# Patient Record
Sex: Female | Born: 1965 | Race: Black or African American | Hispanic: No | Marital: Married | Smoking: Current every day smoker
Health system: Southern US, Community
[De-identification: ages and names within clinical notes are randomized; demographics above are authoritative.]

## PROBLEM LIST (undated history)

## (undated) ENCOUNTER — Emergency Department (HOSPITAL_COMMUNITY): Admission: EM | Payer: No Typology Code available for payment source | Source: Home / Self Care

## (undated) DIAGNOSIS — R0683 Snoring: Secondary | ICD-10-CM

## (undated) DIAGNOSIS — I480 Paroxysmal atrial fibrillation: Secondary | ICD-10-CM

## (undated) DIAGNOSIS — E0590 Thyrotoxicosis, unspecified without thyrotoxic crisis or storm: Secondary | ICD-10-CM

## (undated) DIAGNOSIS — E119 Type 2 diabetes mellitus without complications: Secondary | ICD-10-CM

## (undated) DIAGNOSIS — I10 Essential (primary) hypertension: Secondary | ICD-10-CM

## (undated) DIAGNOSIS — D219 Benign neoplasm of connective and other soft tissue, unspecified: Secondary | ICD-10-CM

## (undated) HISTORY — DX: Essential (primary) hypertension: I10

## (undated) HISTORY — DX: Thyrotoxicosis, unspecified without thyrotoxic crisis or storm: E05.90

## (undated) HISTORY — DX: Type 2 diabetes mellitus without complications: E11.9

## (undated) HISTORY — PX: UTERINE FIBROID SURGERY: SHX826

## (undated) HISTORY — DX: Snoring: R06.83

## (undated) HISTORY — DX: Morbid (severe) obesity due to excess calories: E66.01

## (undated) HISTORY — DX: Paroxysmal atrial fibrillation: I48.0

---

## 1998-06-11 ENCOUNTER — Other Ambulatory Visit: Admission: RE | Admit: 1998-06-11 | Discharge: 1998-06-11 | Payer: Self-pay | Admitting: Obstetrics and Gynecology

## 1998-10-27 ENCOUNTER — Emergency Department (HOSPITAL_COMMUNITY): Admission: EM | Admit: 1998-10-27 | Discharge: 1998-10-27 | Payer: Self-pay | Admitting: Emergency Medicine

## 2000-01-08 ENCOUNTER — Emergency Department (HOSPITAL_COMMUNITY): Admission: EM | Admit: 2000-01-08 | Discharge: 2000-01-08 | Payer: Self-pay | Admitting: Emergency Medicine

## 2000-11-19 ENCOUNTER — Encounter: Payer: Self-pay | Admitting: Emergency Medicine

## 2000-11-19 ENCOUNTER — Emergency Department (HOSPITAL_COMMUNITY): Admission: EM | Admit: 2000-11-19 | Discharge: 2000-11-19 | Payer: Self-pay | Admitting: Emergency Medicine

## 2001-04-30 ENCOUNTER — Other Ambulatory Visit: Admission: RE | Admit: 2001-04-30 | Discharge: 2001-04-30 | Payer: Self-pay | Admitting: Obstetrics and Gynecology

## 2002-02-04 ENCOUNTER — Encounter: Payer: Self-pay | Admitting: Obstetrics and Gynecology

## 2002-02-04 ENCOUNTER — Ambulatory Visit (HOSPITAL_COMMUNITY): Admission: RE | Admit: 2002-02-04 | Discharge: 2002-02-04 | Payer: Self-pay | Admitting: Obstetrics and Gynecology

## 2003-02-07 ENCOUNTER — Encounter: Payer: Self-pay | Admitting: Emergency Medicine

## 2003-02-07 ENCOUNTER — Emergency Department (HOSPITAL_COMMUNITY): Admission: EM | Admit: 2003-02-07 | Discharge: 2003-02-07 | Payer: Self-pay | Admitting: Emergency Medicine

## 2004-11-16 ENCOUNTER — Emergency Department (HOSPITAL_COMMUNITY): Admission: EM | Admit: 2004-11-16 | Discharge: 2004-11-17 | Payer: Self-pay | Admitting: Emergency Medicine

## 2004-12-17 ENCOUNTER — Ambulatory Visit (HOSPITAL_COMMUNITY): Admission: RE | Admit: 2004-12-17 | Discharge: 2004-12-17 | Payer: Self-pay | Admitting: Oncology

## 2005-05-09 ENCOUNTER — Other Ambulatory Visit: Admission: RE | Admit: 2005-05-09 | Discharge: 2005-05-09 | Payer: Self-pay | Admitting: Gynecology

## 2005-10-20 ENCOUNTER — Emergency Department (HOSPITAL_COMMUNITY): Admission: EM | Admit: 2005-10-20 | Discharge: 2005-10-21 | Payer: Self-pay | Admitting: Emergency Medicine

## 2008-03-26 ENCOUNTER — Ambulatory Visit (HOSPITAL_COMMUNITY): Admission: RE | Admit: 2008-03-26 | Discharge: 2008-03-26 | Payer: Self-pay | Admitting: Obstetrics and Gynecology

## 2009-03-15 ENCOUNTER — Observation Stay (HOSPITAL_COMMUNITY): Admission: EM | Admit: 2009-03-15 | Discharge: 2009-03-15 | Payer: Self-pay | Admitting: Emergency Medicine

## 2009-05-04 ENCOUNTER — Emergency Department (HOSPITAL_COMMUNITY): Admission: EM | Admit: 2009-05-04 | Discharge: 2009-05-04 | Payer: Self-pay | Admitting: Emergency Medicine

## 2009-08-20 ENCOUNTER — Other Ambulatory Visit: Admission: RE | Admit: 2009-08-20 | Discharge: 2009-08-20 | Payer: Self-pay | Admitting: Family Medicine

## 2011-03-15 LAB — COMPREHENSIVE METABOLIC PANEL
AST: 38 U/L — ABNORMAL HIGH (ref 0–37)
Albumin: 4.1 g/dL (ref 3.5–5.2)
Alkaline Phosphatase: 45 U/L (ref 39–117)
CO2: 27 mEq/L (ref 19–32)
Chloride: 109 mEq/L (ref 96–112)
GFR calc Af Amer: >60 mL/min (ref >60)
Potassium: 4.5 mEq/L (ref 3.5–5.1)
Total Bilirubin: 0.7 mg/dL (ref 0.3–1.2)

## 2011-03-15 LAB — CBC
HCT: 35.1 % — ABNORMAL LOW (ref 36.0–46.0)
Platelets: 279 10*3/uL (ref 150–400)
RBC: 4.31 MIL/uL (ref 3.87–5.11)
WBC: 9.9 10*3/uL (ref 4.0–10.5)

## 2011-03-15 LAB — POCT PREGNANCY, URINE: Preg Test, Ur: NEGATIVE

## 2011-03-15 LAB — URINALYSIS, ROUTINE W REFLEX MICROSCOPIC
Bilirubin Urine: NEGATIVE
Ketones, ur: NEGATIVE mg/dL
Protein, ur: NEGATIVE mg/dL
Urobilinogen, UA: 0.2 mg/dL (ref 0.0–1.0)

## 2011-03-15 LAB — DIFFERENTIAL
Basophils Absolute: 0 10*3/uL (ref 0.0–0.1)
Basophils Relative: 0 % (ref 0–1)
Eosinophils Absolute: 0.3 10*3/uL (ref 0.0–0.7)
Eosinophils Relative: 3 % (ref 0–5)
Monocytes Absolute: 0.9 10*3/uL (ref 0.1–1.0)

## 2011-03-15 LAB — URINE MICROSCOPIC-ADD ON

## 2011-03-16 LAB — POCT CARDIAC MARKERS
CKMB, poc: <1 ng/mL — ABNORMAL LOW (ref 1.0–8.0)
Myoglobin, poc: 31.9 ng/mL (ref 12–200)
Troponin i, poc: <0.05 ng/mL (ref 0.00–0.09)

## 2011-03-16 LAB — POCT I-STAT, CHEM 8
Chloride: 105 mEq/L (ref 96–112)
Glucose, Bld: 125 mg/dL — ABNORMAL HIGH (ref 70–99)
HCT: 43 % (ref 36.0–46.0)
Potassium: 3.5 mEq/L (ref 3.5–5.1)

## 2011-03-16 LAB — CBC
Hemoglobin: 11.9 g/dL — ABNORMAL LOW (ref 12.0–15.0)
RBC: 4.47 MIL/uL (ref 3.87–5.11)
RDW: 15.7 % — ABNORMAL HIGH (ref 11.5–15.5)

## 2011-03-16 LAB — MAGNESIUM: Magnesium: 1.9 mg/dL (ref 1.5–2.5)

## 2011-03-16 LAB — APTT: aPTT: 26 seconds (ref 24–37)

## 2011-03-16 LAB — DIFFERENTIAL
Basophils Absolute: 0.1 10*3/uL (ref 0.0–0.1)
Lymphocytes Relative: 57 % — ABNORMAL HIGH (ref 12–46)
Monocytes Absolute: 0.9 10*3/uL (ref 0.1–1.0)
Neutro Abs: 3.7 10*3/uL (ref 1.7–7.7)

## 2011-03-16 LAB — LIPID PANEL: VLDL: 17 mg/dL (ref 0–40)

## 2011-03-16 LAB — PROTIME-INR: INR: 1 (ref 0.00–1.49)

## 2011-03-16 LAB — ETHANOL: Alcohol, Ethyl (B): <5 mg/dL (ref 0–10)

## 2011-03-16 LAB — CARDIAC PANEL(CRET KIN+CKTOT+MB+TROPI)
Relative Index: INVALID (ref 0.0–2.5)
Total CK: 87 U/L (ref 7–177)
Troponin I: 0.01 ng/mL (ref 0.00–0.06)

## 2011-03-16 LAB — BASIC METABOLIC PANEL
BUN: 12 mg/dL (ref 6–23)
CO2: 24 mEq/L (ref 19–32)
Chloride: 105 mEq/L (ref 96–112)
Glucose, Bld: 119 mg/dL — ABNORMAL HIGH (ref 70–99)
Potassium: 3.3 mEq/L — ABNORMAL LOW (ref 3.5–5.1)

## 2011-03-16 LAB — D-DIMER, QUANTITATIVE: D-Dimer, Quant: 0.37 ug/mL-FEU (ref 0.00–0.48)

## 2011-03-16 LAB — TSH: TSH: 0.007 u[IU]/mL — ABNORMAL LOW (ref 0.350–4.500)

## 2011-04-19 NOTE — Discharge Summary (Signed)
Haley [name], Haley [name]               ACCOUNT NO.:  [id number]   MEDICAL RECORD NO.:  [id number]          PATIENT TYPE:  OBV   LOCATION:  1419                         FACILITY:  [facility]   PHYSICIAN:  Nanetta Batty, M.D.   DATE OF BIRTH:  [birth year]   DATE OF ADMISSION:  03/14/2009  DATE OF DISCHARGE:  03/15/2009                               DISCHARGE SUMMARY   DISCHARGE DIAGNOSES:  1. Paroxysmal atrial fibrillation, converted spontaneously.  2. Hypothyroidism, TSH pending.   HOSPITAL COURSE:  Patient is a 45 year old female who was admitted to  [facility] March 15, 2009, with PAF which converted spontaneously to  sinus rhythm.  This was without treatment.  She admits to having fairly  heavy caffeine intake on the day of admission, drinking 2 High Voltage  Mountain Dews.  Dr. Allyson Sabal saw her in the morning of March 15, 2009.  Her  potassium was somewhat low and this was replaced.  Her D-dimer was  negative and troponins were negative and TSH is pending.  Dr. Allyson Sabal  suspects it is all secondary to caffeine.  He feels she can be  discharged.  She will have a followup echo in the office and then see  him in a couple weeks.   DISCHARGE MEDICATIONS:  1. Coated aspirin daily.  2. Tapazole 10 mg a day.   Dictation ended at this point.      Abelino Derrick, P.A.      Nanetta Batty, M.D.  Electronically Signed    LKK/MEDQ  D:  03/15/2009  T:  03/15/2009  Job:  [id number]   cc:   Nanetta Batty, M.D.  Fax: [phone]

## 2011-04-19 NOTE — Discharge Summary (Signed)
Haley [name], Haley [name]               ACCOUNT NO.:  [id number]   MEDICAL RECORD NO.:  [id number]          PATIENT TYPE:  OBV   LOCATION:  1419                         FACILITY:  [facility]   PHYSICIAN:  Nanetta Batty, M.D.   DATE OF BIRTH:  [birth year]   DATE OF ADMISSION:  03/14/2009  DATE OF DISCHARGE:  03/15/2009                               DISCHARGE SUMMARY   DISCHARGE DIAGNOSES:  1. Paroxysmal atrial fibrillation, converted spontaneously to sinus      rhythm without treatment.  2. Recent diagnosis of hyperthyroidism, being treated by Dr. Sharl Ma.   HOSPITAL COURSE:  The patient is a 45 year old female who apparently  recently has been diagnosed with hyperthyroidism and is on Tapazole.  She drank two large high voltage [employer].  She presented March 15, 2009, early in the morning with tachycardia.  Apparently in the  emergency room she converted spontaneously without treatment to sinus  rhythm.  She was admitted for further evaluation.  She did have some  chest pain with her tachycardia.  Her enzymes were negative for an MI.  Dr. Allyson Sabal saw her in the morning March 15, 2009.  He felt she could be  discharged.  We will get a followup echocardiogram as an outpatient and  then he will see her back.  We did add a full dose aspirin to her  Tapazole.  Her TSH is pending.  I discussed her case with Dr. Allyson Sabal in  the morning of discharge.  If her TSH is extremely low then we may  consider further treatment, i.e. beta-blocker and/or possibly Coumadin.   LABS:  White count 11.3, hemoglobin 11.9, hematocrit 37.8, platelets  311.  INR is 1.0.  Sodium 140, potassium 3.3.  She was given potassium  replacement.  BUN 12, creatinine 0.5.  Troponins were negative x3.  Lipid panel shows a cholesterol 123, HDL 40, LDL 66.  Alcohol level was  less than 5.  Chest x-ray shows no acute process.  EKG shows AF with  rapid ventricular response on admission and when she converted to sinus  rhythm it was sinus  with nonspecific ST changes.   DISPOSITION:  The patient is discharged in stable condition.  Will  follow up her TSH.  If she has recurrent atrial fibrillation we may  consider adding a beta-blocker and/or Coumadin.  She will get an  echocardiogram in the office and see Dr. Allyson Sabal in followup.      Abelino Derrick, P.A.      Nanetta Batty, M.D.  Electronically Signed    LKK/MEDQ  D:  03/15/2009  T:  03/15/2009  Job:  [id number]   cc:   Nanetta Batty, M.D.  Fax: [phone]   Tonita Cong, M.D.

## 2013-11-04 ENCOUNTER — Ambulatory Visit (HOSPITAL_COMMUNITY): Admit: 2013-11-04 | Payer: No Typology Code available for payment source

## 2013-11-04 ENCOUNTER — Ambulatory Visit (HOSPITAL_COMMUNITY)
Admission: RE | Admit: 2013-11-04 | Discharge: 2013-11-04 | Disposition: A | Discharge status: Home / Self Care | Payer: No Typology Code available for payment source | Source: Ambulatory Visit | Attending: Nurse Practitioner | Admitting: Nurse Practitioner

## 2013-11-04 ENCOUNTER — Other Ambulatory Visit (HOSPITAL_COMMUNITY): Payer: Self-pay | Admitting: Nurse Practitioner

## 2013-11-04 DIAGNOSIS — R52 Pain, unspecified: Secondary | ICD-10-CM

## 2013-11-04 DIAGNOSIS — M25559 Pain in unspecified hip: Secondary | ICD-10-CM | POA: Insufficient documentation

## 2013-11-04 DIAGNOSIS — M25569 Pain in unspecified knee: Secondary | ICD-10-CM | POA: Insufficient documentation

## 2015-04-20 ENCOUNTER — Ambulatory Visit: Payer: No Typology Code available for payment source | Attending: Internal Medicine

## 2015-06-28 DIAGNOSIS — J3489 Other specified disorders of nose and nasal sinuses: Secondary | ICD-10-CM | POA: Insufficient documentation

## 2015-06-28 DIAGNOSIS — K0381 Cracked tooth: Secondary | ICD-10-CM | POA: Insufficient documentation

## 2015-06-28 DIAGNOSIS — K047 Periapical abscess without sinus: Secondary | ICD-10-CM | POA: Insufficient documentation

## 2015-06-28 DIAGNOSIS — H9202 Otalgia, left ear: Secondary | ICD-10-CM | POA: Insufficient documentation

## 2015-06-28 DIAGNOSIS — R0981 Nasal congestion: Secondary | ICD-10-CM | POA: Insufficient documentation

## 2015-06-28 DIAGNOSIS — J029 Acute pharyngitis, unspecified: Secondary | ICD-10-CM | POA: Insufficient documentation

## 2015-06-29 ENCOUNTER — Emergency Department (HOSPITAL_COMMUNITY)
Admission: EM | Admit: 2015-06-29 | Discharge: 2015-06-29 | Disposition: A | Discharge status: Home / Self Care | Payer: No Typology Code available for payment source | Attending: Emergency Medicine | Admitting: Emergency Medicine

## 2015-06-29 ENCOUNTER — Encounter (HOSPITAL_COMMUNITY): Payer: Self-pay | Admitting: Emergency Medicine

## 2015-06-29 DIAGNOSIS — K047 Periapical abscess without sinus: Secondary | ICD-10-CM

## 2015-06-29 DIAGNOSIS — S025XXA Fracture of tooth (traumatic), initial encounter for closed fracture: Secondary | ICD-10-CM

## 2015-06-29 MED ORDER — OXYCODONE-ACETAMINOPHEN 5-325 MG PO TABS: 1.0000 | ORAL_TABLET | ORAL | Status: DC | PRN

## 2015-06-29 MED ORDER — OXYCODONE-ACETAMINOPHEN 5-325 MG PO TABS
1.0000 | ORAL_TABLET | Freq: Once | ORAL | Status: AC
Start: 2015-06-29 — End: 2015-06-29
  Administered 2015-06-29: 1 via ORAL
  Filled 2015-06-29: qty 1

## 2015-06-29 MED ORDER — AMOXICILLIN 500 MG PO CAPS: 500.0000 mg | ORAL_CAPSULE | Freq: Three times a day (TID) | ORAL | Status: DC

## 2015-06-29 MED ORDER — AMOXICILLIN 500 MG PO CAPS
500.0000 mg | ORAL_CAPSULE | Freq: Once | ORAL | Status: AC
  Administered 2015-06-29: 500 mg via ORAL
  Filled 2015-06-29: qty 1

## 2015-06-29 NOTE — ED Notes (Signed)
Patient with history of broken tooth on the top, left jaw.  Patient continues with more pain this evening after using OTC meds.

## 2015-06-29 NOTE — Discharge Instructions (Signed)

## 2015-06-29 NOTE — ED Provider Notes (Signed)
CSN: [id number]     Arrival date & time 06/28/15  2358 History   First MD Initiated Contact with Patient 06/29/15 0009     Chief Complaint  Patient presents with  . Dental Pain     (Consider location/radiation/quality/duration/timing/severity/associated sxs/prior Treatment) Patient is a 49 y.o. female presenting with tooth pain. The history is provided by the patient.  Dental Pain Location:  Upper Upper teeth location:  15/LU 2nd molar Quality:  Throbbing Severity:  Severe Onset quality:  Gradual Duration:  3 days Timing:  Constant Progression:  Worsening Chronicity:  New Context: abscess and dental fracture   Relieved by:  Nothing Worsened by:  Cold food/drink, touching, pressure and jaw movement Ineffective treatments:  Acetaminophen and NSAIDs Associated symptoms: congestion, facial pain and facial swelling    Haley [name] is a 49 y.o. female who presents to the ED with dental pain and facial swelling that started 3 days ago after she was eating and broke her tooth. She has been taking tylenol and ibuprofen without relief. Tonight she was unable to sleep and noted swelling to her face. She also complains of ear pain on the left and sore throat. She denies fever or chills. She does not have a dentist.   History reviewed. No pertinent past medical history. History reviewed. No pertinent past surgical history. History reviewed. No pertinent family history. History  Substance Use Topics  . Smoking status: Never Smoker   . Smokeless tobacco: Not on file  . Alcohol Use: No   OB History    No data available     Review of Systems  HENT: Positive for congestion, dental problem, ear pain, facial swelling and sore throat.   all other systems negative    Allergies  Review of patient's allergies indicates no known allergies.  Home Medications   Prior to Admission medications   Medication Sig Start Date End Date Taking? Authorizing Provider  amoxicillin (AMOXIL) 500  MG capsule Take 1 capsule (500 mg total) by mouth 3 (three) times daily. 06/29/15   Kidus Delman Bunnie Pion, NP  oxyCODONE-acetaminophen (ROXICET) 5-325 MG per tablet Take 1 tablet by mouth every 4 (four) hours as needed for severe pain. 06/29/15   Shalon Salado Bunnie Pion, NP   BP 175/89 mmHg  Pulse 95  Temp(Src) 98.8 F (37.1 C) (Oral)  Resp 18  Ht 5\' 7"  (1.702 m)  Wt 306 lb 6 oz (138.971 kg)  BMI 47.97 kg/m2  SpO2 92% Physical Exam  Constitutional: She is oriented to person, place, and time. She appears well-developed and well-nourished.  HENT:  Right Ear: Tympanic membrane normal.  Left Ear: Tympanic membrane normal.  Nose: Rhinorrhea present.  Mouth/Throat: Uvula is midline, oropharynx is clear and moist and mucous membranes are normal.    Tooth broken with swelling of the gum surrounding the tooth. Mild facial swelling noted. Tender on exam.   Eyes: Conjunctivae and EOM are normal.  Neck: Normal range of motion. Neck supple.  Cardiovascular: Normal rate.   Pulmonary/Chest: Effort normal.  Abdominal: Soft. There is no tenderness.  Musculoskeletal: Normal range of motion.  Lymphadenopathy:    She has cervical adenopathy.  Neurological: She is alert and oriented to person, place, and time. No cranial nerve deficit.  Skin: Skin is warm and dry.  Psychiatric: She has a normal mood and affect. Her behavior is normal.  Nursing note and vitals reviewed.   ED Course  Procedures  Amoxicillin 500 mg PO, Percocet 5/325 mg PO given in ED.  MDM  49 y.o. female with dental pain and broken tooth x 3 day with facial swelling today. Stable for d/c without fever and does not appear toxic. Will treat for pain and infection. She will continue to take ibuprofen and call the dentis tomorrow. Discussed with the patient and all questioned fully answered. .   Final diagnoses:  Dental abscess  Broken tooth, closed, initial encounter      [facility], NP 06/29/15 2182  Sherwood Gambler, MD 07/01/15 0140

## 2015-10-09 ENCOUNTER — Encounter (HOSPITAL_COMMUNITY): Payer: Self-pay | Admitting: *Deleted

## 2015-10-09 ENCOUNTER — Emergency Department (INDEPENDENT_AMBULATORY_CARE_PROVIDER_SITE_OTHER)
Admission: EM | Admit: 2015-10-09 | Discharge: 2015-10-09 | Disposition: A | Discharge status: Home / Self Care | Payer: Self-pay | Source: Home / Self Care | Attending: Family Medicine | Admitting: Family Medicine

## 2015-10-09 DIAGNOSIS — M549 Dorsalgia, unspecified: Secondary | ICD-10-CM

## 2015-10-09 DIAGNOSIS — G8929 Other chronic pain: Secondary | ICD-10-CM

## 2015-10-09 MED ORDER — CYCLOBENZAPRINE HCL 5 MG PO TABS: 5.0000 mg | ORAL_TABLET | Freq: Three times a day (TID) | ORAL | Status: DC | PRN

## 2015-10-09 MED ORDER — DICLOFENAC POTASSIUM 50 MG PO TABS: 50.0000 mg | ORAL_TABLET | Freq: Three times a day (TID) | ORAL | Status: DC

## 2015-10-09 NOTE — Discharge Instructions (Signed)
Use medicine as prescribed and see orthopedist for further problems. °

## 2015-10-09 NOTE — ED Notes (Signed)
Pt  Reports  Back  Pain         Shortness  Of  Breath  On  Exertion       Pt  Reports has  Had  Problems  With  Her  thyriod  As   Well        Pt  Ambulated  To  Room  With  A  Steady  Fluid  Gait      Sitting      Upright      On  Exam table  Speaking  In   Complete    sentances

## 2015-10-09 NOTE — ED Provider Notes (Addendum)
CSN: [id number]     Arrival date & time 10/09/15  1720 History   First MD Initiated Contact with Patient 10/09/15 1820     Chief Complaint  Patient presents with  . Back Pain   (Consider location/radiation/quality/duration/timing/severity/associated sxs/prior Treatment) Patient is a 49 y.o. female presenting with back pain. The history is provided by the patient.  Back Pain Location:  Lumbar spine Quality:  Stiffness Radiates to:  Does not radiate Pain severity:  Mild Duration:  6 months Progression:  Unchanged Chronicity:  Chronic Context comment:  Rides an exercise bike and does water aerobics.has been seeing chiropracter without relief. Associated symptoms: no abdominal pain, no abdominal swelling, no bladder incontinence, no leg pain, no numbness, no paresthesias, no pelvic pain, no tingling and no weakness   Risk factors: obesity     History reviewed. No pertinent past medical history. History reviewed. No pertinent past surgical history. History reviewed. No pertinent family history. Social History  Substance Use Topics  . Smoking status: Never Smoker   . Smokeless tobacco: None  . Alcohol Use: No   OB History    No data available     Review of Systems  Constitutional: Negative.   Gastrointestinal: Negative.  Negative for abdominal pain.  Genitourinary: Negative.  Negative for bladder incontinence and pelvic pain.  Musculoskeletal: Positive for back pain. Negative for joint swelling and gait problem.  Skin: Negative.   Neurological: Negative for tingling, weakness, numbness and paresthesias.  All other systems reviewed and are negative.   Allergies  Review of patient's allergies indicates no known allergies.  Home Medications   Prior to Admission medications   Medication Sig Start Date End Date Taking? Authorizing Provider  amoxicillin (AMOXIL) 500 MG capsule Take 1 capsule (500 mg total) by mouth 3 (three) times daily. 06/29/15   Hope Bunnie Pion, NP   cyclobenzaprine (FLEXERIL) 5 MG tablet Take 1 tablet (5 mg total) by mouth 3 (three) times daily as needed for muscle spasms. 10/09/15   Billy Fischer, MD  diclofenac (CATAFLAM) 50 MG tablet Take 1 tablet (50 mg total) by mouth 3 (three) times daily. For back pain 10/09/15   Billy Fischer, MD  oxyCODONE-acetaminophen (ROXICET) 5-325 MG per tablet Take 1 tablet by mouth every 4 (four) hours as needed for severe pain. 06/29/15   Hope Bunnie Pion, NP   Meds Ordered and Administered this Visit  Medications - No data to display  BP 136/84 mmHg  Pulse 90  Temp(Src) 98 F (36.7 C) (Oral)  Resp 16  SpO2 95% No data found.   Physical Exam  Constitutional: She is oriented to person, place, and time. She appears well-developed and well-nourished. No distress.  Abdominal: Soft. Bowel sounds are normal. She exhibits no distension and no mass. There is no tenderness. There is no rebound and no guarding.  Musculoskeletal: She exhibits tenderness.       Lumbar back: She exhibits decreased range of motion, tenderness, pain and spasm. She exhibits no bony tenderness, no swelling, no edema, no deformity and normal pulse.  Neurological: She is alert and oriented to person, place, and time.  Skin: Skin is warm and dry.  Nursing note and vitals reviewed.   ED Course  Procedures (including critical care time)  Labs Review Labs Reviewed - No data to display  Imaging Review No results found.   Visual Acuity Review  Right Eye Distance:   Left Eye Distance:   Bilateral Distance:    Right Eye Near:  Left Eye Near:    Bilateral Near:         MDM   1. Back pain, chronic        Billy Fischer, MD 10/09/15 9150  Billy Fischer, MD 10/09/15 [phone]

## 2015-11-11 ENCOUNTER — Ambulatory Visit (HOSPITAL_COMMUNITY)
Admission: RE | Admit: 2015-11-11 | Discharge: 2015-11-11 | Disposition: A | Discharge status: Home / Self Care | Payer: No Typology Code available for payment source | Source: Ambulatory Visit | Attending: Internal Medicine | Admitting: Internal Medicine

## 2015-11-11 ENCOUNTER — Other Ambulatory Visit (HOSPITAL_COMMUNITY): Payer: Self-pay | Admitting: Internal Medicine

## 2015-11-11 DIAGNOSIS — R0602 Shortness of breath: Secondary | ICD-10-CM

## 2015-11-11 DIAGNOSIS — R918 Other nonspecific abnormal finding of lung field: Secondary | ICD-10-CM | POA: Insufficient documentation

## 2015-11-11 DIAGNOSIS — R079 Chest pain, unspecified: Secondary | ICD-10-CM | POA: Insufficient documentation

## 2015-11-11 DIAGNOSIS — R600 Localized edema: Secondary | ICD-10-CM | POA: Insufficient documentation

## 2015-11-11 DIAGNOSIS — Z87891 Personal history of nicotine dependence: Secondary | ICD-10-CM | POA: Insufficient documentation

## 2016-08-29 ENCOUNTER — Emergency Department (HOSPITAL_COMMUNITY)
Admission: EM | Admit: 2016-08-29 | Discharge: 2016-08-30 | Disposition: A | Discharge status: Home / Self Care | Payer: No Typology Code available for payment source | Attending: Emergency Medicine | Admitting: Emergency Medicine

## 2016-08-29 ENCOUNTER — Encounter (HOSPITAL_COMMUNITY): Payer: Self-pay

## 2016-08-29 ENCOUNTER — Emergency Department (HOSPITAL_COMMUNITY): Payer: No Typology Code available for payment source

## 2016-08-29 DIAGNOSIS — I10 Essential (primary) hypertension: Secondary | ICD-10-CM | POA: Insufficient documentation

## 2016-08-29 DIAGNOSIS — E119 Type 2 diabetes mellitus without complications: Secondary | ICD-10-CM | POA: Insufficient documentation

## 2016-08-29 DIAGNOSIS — I4891 Unspecified atrial fibrillation: Secondary | ICD-10-CM | POA: Insufficient documentation

## 2016-08-29 DIAGNOSIS — Z7901 Long term (current) use of anticoagulants: Secondary | ICD-10-CM | POA: Insufficient documentation

## 2016-08-29 DIAGNOSIS — Z7984 Long term (current) use of oral hypoglycemic drugs: Secondary | ICD-10-CM | POA: Insufficient documentation

## 2016-08-29 DIAGNOSIS — Z7982 Long term (current) use of aspirin: Secondary | ICD-10-CM | POA: Insufficient documentation

## 2016-08-29 HISTORY — DX: Benign neoplasm of connective and other soft tissue, unspecified: D21.9

## 2016-08-29 LAB — CBC WITH DIFFERENTIAL/PLATELET
BASOS ABS: 0 10*3/uL (ref 0.0–0.1)
BASOS PCT: 0 %
EOS ABS: 0.2 10*3/uL (ref 0.0–0.7)
Eosinophils Relative: 2 %
HCT: 47.2 % — ABNORMAL HIGH (ref 36.0–46.0)
HEMOGLOBIN: 14.6 g/dL (ref 12.0–15.0)
Lymphocytes Relative: 43 %
Lymphs Abs: 3.7 10*3/uL (ref 0.7–4.0)
MCH: 29.9 pg (ref 26.0–34.0)
MCHC: 30.9 g/dL (ref 30.0–36.0)
MCV: 96.5 fL (ref 78.0–100.0)
MONOS PCT: 7 %
Monocytes Absolute: 0.6 10*3/uL (ref 0.1–1.0)
NEUTROS PCT: 48 %
Neutro Abs: 4.1 10*3/uL (ref 1.7–7.7)
Platelets: 282 10*3/uL (ref 150–400)
RBC: 4.89 MIL/uL (ref 3.87–5.11)
RDW: 13.6 % (ref 11.5–15.5)
WBC: 8.5 10*3/uL (ref 4.0–10.5)

## 2016-08-29 LAB — BASIC METABOLIC PANEL
Anion gap: 8 (ref 5–15)
BUN: 10 mg/dL (ref 6–20)
CALCIUM: 9.3 mg/dL (ref 8.9–10.3)
CHLORIDE: 103 mmol/L (ref 101–111)
CO2: 29 mmol/L (ref 22–32)
CREATININE: 0.51 mg/dL (ref 0.44–1.00)
GFR calc non Af Amer: >60 mL/min (ref >60)
Glucose, Bld: 137 mg/dL — ABNORMAL HIGH (ref 65–99)
Potassium: 4.1 mmol/L (ref 3.5–5.1)
SODIUM: 140 mmol/L (ref 135–145)

## 2016-08-29 LAB — MAGNESIUM: MAGNESIUM: 1.8 mg/dL (ref 1.7–2.4)

## 2016-08-29 LAB — I-STAT TROPONIN, ED: TROPONIN I, POC: 0.01 ng/mL (ref 0.00–0.08)

## 2016-08-29 MED ORDER — ETOMIDATE 2 MG/ML IV SOLN
INTRAVENOUS | Status: AC | PRN
  Administered 2016-08-29: 14 mg via INTRAVENOUS

## 2016-08-29 MED ORDER — ETOMIDATE 2 MG/ML IV SOLN
INTRAVENOUS | Status: AC
  Filled 2016-08-29: qty 10

## 2016-08-29 MED ORDER — ETOMIDATE 2 MG/ML IV SOLN: 0.1000 mg/kg | Freq: Once | INTRAVENOUS | Status: DC

## 2016-08-29 MED ORDER — APIXABAN 5 MG PO TABS: 5.0000 mg | ORAL_TABLET | Freq: Two times a day (BID) | ORAL | 0 refills | Status: DC

## 2016-08-29 MED ORDER — METOPROLOL TARTRATE 50 MG PO TABS: 25.0000 mg | ORAL_TABLET | Freq: Two times a day (BID) | ORAL | 0 refills | Status: DC

## 2016-08-29 NOTE — Sedation Documentation (Signed)
Carrdioversion successful. 200 joules. Synchronized. Patient remains sedated. This RN remains at bedside.

## 2016-08-29 NOTE — ED Provider Notes (Signed)
[facility] DEPT Provider Note   CSN: [id number] Arrival date & time: 08/29/16  1730     History   Chief Complaint Chief Complaint  Patient presents with  . Atrial Fibrillation    HPI Haley [name] is a 50 y.o. female.  The history is provided by the patient.  Chest Pain   This is a new problem. The current episode started 3 to 5 hours ago (began at 1345). The problem occurs constantly. The problem has been gradually improving. Associated with: normal activity, was getting dressed/ready for outpatient appointment. The pain is present in the substernal region. The pain is moderate. The quality of the pain is described as pressure-like. The pain does not radiate. Duration of episode(s) is 3 hours. Associated symptoms include irregular heartbeat, malaise/fatigue and shortness of breath. Pertinent negatives include no abdominal pain, no back pain, no cough, no diaphoresis, no fever, no headaches, no leg pain, no lower extremity edema, no nausea, no near-syncope, no vomiting and no weakness. She has tried rest for the symptoms. The treatment provided no relief. Risk factors include obesity. Past medical history comments: prior one episode of atrial fibrillation    Past Medical History:  Diagnosis Date  . Diabetes ([place])    pt. states boarderline but on medications for DM  . Fibroids   . Hypertension     There are no active problems to display for this patient.   Past Surgical History:  Procedure Laterality Date  . UTERINE FIBROID SURGERY      OB History    No data available       Home Medications    Prior to Admission medications   Medication Sig Start Date End Date Taking? Authorizing Provider  Aspirin-Caffeine (BAYER BACK & BODY PAIN EX ST) 500-32.5 MG TABS Take 1 tablet by mouth every morning.   Yes Historical Provider, MD  cyclobenzaprine (FLEXERIL) 10 MG tablet Take 10 mg by mouth at bedtime as needed for muscle spasms.    Yes Historical Provider, MD    glipiZIDE (GLUCOTROL) 10 MG tablet Take 10 mg by mouth daily before breakfast.   Yes Historical Provider, MD  hydrochlorothiazide (HYDRODIURIL) 25 MG tablet Take 25 mg by mouth daily.   Yes Historical Provider, MD  metFORMIN (GLUCOPHAGE) 1000 MG tablet Take 1,000 mg by mouth 2 (two) times daily with a meal.   Yes Historical Provider, MD  Multiple Vitamins-Minerals (MULTIVITAMIN ADULTS) TABS Take 1 tablet by mouth daily.   Yes Historical Provider, MD  naproxen (NAPROSYN) 500 MG tablet Take 500 mg by mouth every morning.    Yes Historical Provider, MD  NON FORMULARY Take 1 tablet by mouth daily. Seven seals for knee joint pain   Yes Historical Provider, MD  apixaban (ELIQUIS) 5 MG TABS tablet Take 1 tablet (5 mg total) by mouth 2 (two) times daily. 08/29/16 09/12/16  Paralee Cancel, MD  metoprolol (LOPRESSOR) 50 MG tablet Take 0.5 tablets (25 mg total) by mouth 2 (two) times daily. 08/29/16 09/12/16  Paralee Cancel, MD    Family History No family history on file.  Social History Social History  Substance Use Topics  . Smoking status: Never Smoker  . Smokeless tobacco: Not on file  . Alcohol use No     Allergies   Review of patient's allergies indicates no known allergies.   Review of Systems Review of Systems  Constitutional: Positive for malaise/fatigue. Negative for diaphoresis and fever.  HENT: Negative for congestion.   Eyes: Negative for visual disturbance.  Respiratory: Positive for shortness of breath. Negative for cough.   Cardiovascular: Positive for chest pain. Negative for near-syncope.  Gastrointestinal: Negative for abdominal pain, nausea and vomiting.  Genitourinary: Negative for flank pain.  Musculoskeletal: Negative for back pain.  Skin: Negative for rash.  Neurological: Negative for facial asymmetry, speech difficulty, weakness, light-headedness and headaches.  Psychiatric/Behavioral: Negative for confusion.     Physical Exam Updated Vital Signs BP 118/64    Pulse 82   Temp 98.3 F (36.8 C) (Oral)   Resp 19   Ht 5\' 7"  (1.702 m)   Wt (!) 139 kg   SpO2 96%   BMI 48.00 kg/m   Physical Exam  Constitutional: She is oriented to person, place, and time. She appears well-developed. No distress.  Morbidly obese. Pleasant, cooperative, non-toxic appearing  HENT:  Head: Normocephalic and atraumatic.  Mouth/Throat: Oropharynx is clear and moist.  Eyes: Conjunctivae are normal. No scleral icterus.  Neck: Normal range of motion. Neck supple.  Cardiovascular: Intact distal pulses.   2+ radial and dp's bilaterally. Irregularly irregular rhythm and HR 120's  Pulmonary/Chest: Effort normal and breath sounds normal. No respiratory distress.  Abdominal: Soft. There is no tenderness.  Musculoskeletal: She exhibits no edema.  Neurological: She is alert and oriented to person, place, and time. No cranial nerve deficit. She exhibits normal muscle tone. Coordination normal.  Skin: Skin is warm and dry. Capillary refill takes less than 2 seconds. No rash noted. She is not diaphoretic.  Psychiatric: She has a normal mood and affect.  Nursing note and vitals reviewed.    ED Treatments / Results  Labs (all labs ordered are listed, but only abnormal results are displayed) Labs Reviewed  CBC WITH DIFFERENTIAL/PLATELET - Abnormal; Notable for the following:       Result Value   HCT 47.2 (*)    All other components within normal limits  BASIC METABOLIC PANEL - Abnormal; Notable for the following:    Glucose, Bld 137 (*)    All other components within normal limits  MAGNESIUM  I-STAT TROPOININ, ED    EKG  EKG Interpretation  Date/Time:  Monday August 29 2016 17:39:43 EDT Ventricular Rate:  127 PR Interval:    QRS Duration: 77 QT Interval:  308 QTC Calculation: 448 R Axis:   85 Text Interpretation:  Atrial fibrillation Ventricular premature complex Baseline wander in lead(s) III since previous tracing, A fib with RVR is new; however A fib w/ RVR  present on previous tracings, no acute ischemic changes Confirmed by LITTLE MD, RACHEL [phone]) on 08/29/2016 5:44:40 PM       Radiology Dg Chest 2 View  Result Date: 08/29/2016 CLINICAL DATA:  50 y/o  F; evaluate for pulmonary edema. EXAM: CHEST  2 VIEW COMPARISON:  11/11/2015 chest radiograph. FINDINGS: Borderline cardiomegaly. Prominent central pulmonary vasculature increased from prior study. Pulmonary venous hypertension. Fluid along the right minor fissure. Focal consolidation. No pneumothorax. No acute osseous abnormality is evident. IMPRESSION: Increased pulmonary vascular congestion and borderline cardiomegaly. Trace fluid on the right minor fissure. No focal consolidation or pneumothorax. Electronically Signed   By: Kristine Garbe M.D.   On: 08/29/2016 19:12    Procedures .Cardioversion Date/Time: 08/29/2016 11:06 PM Performed by: Paralee Cancel Authorized by: Paralee Cancel   Consent:    Consent obtained:  Written   Consent given by:  Patient and spouse   Risks discussed:  Death and induced arrhythmia   Alternatives discussed:  Alternative treatment and delayed treatment Pre-procedure details:  Cardioversion basis:  Elective   Rhythm:  Atrial fibrillation (with RVR)   Electrode placement:  Anterior-posterior Attempt one:    Cardioversion mode:  Synchronous   Waveform:  Biphasic   Shock (Joules):  200   Shock outcome:  Conversion to normal sinus rhythm Post-procedure details:    Patient status:  Awake   Patient tolerance of procedure:  Tolerated well, no immediate complications   (including critical care time)  Medications Ordered in ED Medications  etomidate (AMIDATE) injection 13.9 mg (not administered)  etomidate (AMIDATE) 2 MG/ML injection (not administered)  etomidate (AMIDATE) injection (14 mg Intravenous Given 08/29/16 2151)     Initial Impression / Assessment and Plan / ED Course  I have reviewed the triage vital signs and the nursing  notes.  Pertinent labs & imaging results that were available during my care of the patient were reviewed by me and considered in my medical decision making (see chart for details).  Clinical Course   Haley [name] is a 50 y.o. female with prior h/o caffeine-provoked atrial fibrillation x 1 episode in 2010 who presented to ED via EMS from [facility] where she was being seen for routine appointment but states today at 1345 she was getting dressed and suddenly felt substeranal nonradiating chest pressue with general fatigue and mild dyspnea that continued, found to be in A-fib with RVR, rate up to 180's at clinic, given 324mg  aspirin and 30mg  cardizem en route by EMS. Arrives with a-fib and HR in 120's.  Reports she thinks she accidentally drank caffinated coffee rather than decaf this morning. No risk factors for Pe. Called and spoke to cardiologist Dr. Lilian Kapur, who agrees with plan for electrical cardioversion, which is performed under procedural sedation with Etomidate, converted with one synchronized defibrillation at 200J back to NSR with rate in 70's, pt tolerated procedures well. Discharged home on Elaquis 5 BID and Metop 25 BID per cardiology recommendations. Advised to hold-off on other BP medications if the metoprolol drops her BP too much. Advised no taking NSAIDs or aspirin with the Elaquis. Cardiology to arrange outpatient f/u in next couple of weeks. Advised to return for any new, worse, or concerning symptoms. She demonstrates understanding of this and comfort with d/c home.  Pt condition, course, and discharge were discussed with attending physician Dr. Theotis Burrow.  Final Clinical Impressions(s) / ED Diagnoses   Final diagnoses:  Atrial fibrillation with RVR ([facility])    New Prescriptions New Prescriptions   APIXABAN (ELIQUIS) 5 MG TABS TABLET    Take 1 tablet (5 mg total) by mouth 2 (two) times daily.   METOPROLOL (LOPRESSOR) 50 MG TABLET    Take  0.5 tablets (25 mg total) by mouth 2 (two) times daily.     Paralee Cancel, MD 08/29/16 [place], MD 09/03/16 [phone]

## 2016-08-29 NOTE — ED Triage Notes (Signed)
BIB [facility] from [facility] off of [address]. Pt. Had a scheduled apt. And was not feeling well. Dr. Evelina Bucy pressure and EKG there and recommended she come here because of a fib RVR. Vitals in the field - Pain 8/10, HR 150-190. Pt. Was given 30 mg Cardizem and 4 Asprin.

## 2016-08-29 NOTE — ED Notes (Addendum)
Dr. Jimmye Norman at bedside with patient/spouse discussing cardioversion/conscious sedation & obtaining consent for procedures.

## 2016-08-29 NOTE — ED Notes (Signed)
Patient transported to X-ray 

## 2016-09-30 ENCOUNTER — Ambulatory Visit: Payer: No Typology Code available for payment source | Admitting: Internal Medicine

## 2016-09-30 NOTE — Progress Notes (Deleted)
New Outpatient Visit Date: 09/30/2016  Referring Provider: No referring provider defined for this encounter.  Chief Complaint: Chest pain and atrial fibrillation.  HPI:  Haley [name] is a 50 y.o. year-old female with history of hypertension, borderline diabetes mellitus, and atrial fibrillation, who has been referred by Dr. No ref. provider found for evaluation and management of atrial fibrillation. The patient presented to the emergency department on 08/29/16 episode of chest pain and palpitations. The patient was cardioverted in the emergency department with restoration of sinus rhythm. She was discharged on metoprolol and apixaban.  --------------------------------------------------------------------------------------------------  Cardiovascular History & Procedures: Cardiovascular Problems:  Atrial fibrillation  Risk Factors:  Hypertension and diabetes mellitus  Cath/PCI:  None  CV Surgery:  None  EP Procedures and Devices:  DCCV (08/29/16)  Non-Invasive Evaluation(s):  None  Recent CV Pertinent Labs: Lab Results  Component Value Date   CHOL  03/15/2009    123        ATP III CLASSIFICATION:  <200     mg/dL   Desirable  200-239  mg/dL   Borderline High  >=240    mg/dL   High          HDL 40 03/15/2009   LDLCALC  03/15/2009    66        Total Cholesterol/HDL:CHD Risk Coronary Heart Disease Risk Table                     Men   Women  1/2 Average Risk   3.4   3.3  Average Risk       5.0   4.4  2 X Average Risk   9.6   7.1  3 X Average Risk  23.4   11.0        Use the calculated Patient Ratio above and the CHD Risk Table to determine the patient's CHD Risk.        ATP III CLASSIFICATION (LDL):  <100     mg/dL   Optimal  100-129  mg/dL   Near or Above                    Optimal  130-159  mg/dL   Borderline  160-189  mg/dL   High  >190     mg/dL   Very High   TRIG 85 03/15/2009   CHOLHDL 3.1 03/15/2009   INR 1.0 03/15/2009   K 4.1 08/29/2016   MG 1.8 08/29/2016   BUN 10 08/29/2016   CREATININE 0.51 08/29/2016    --------------------------------------------------------------------------------------------------  Past Medical History:  Diagnosis Date  . Diabetes ([place])    pt. states boarderline but on medications for DM  . Fibroids   . Hypertension     Past Surgical History:  Procedure Laterality Date  . UTERINE FIBROID SURGERY      Outpatient Encounter Prescriptions as of 09/30/2016  Medication Sig  . apixaban (ELIQUIS) 5 MG TABS tablet Take 1 tablet (5 mg total) by mouth 2 (two) times daily.  . Aspirin-Caffeine (BAYER BACK & BODY PAIN EX ST) 500-32.5 MG TABS Take 1 tablet by mouth every morning.  . cyclobenzaprine (FLEXERIL) 10 MG tablet Take 10 mg by mouth at bedtime as needed for muscle spasms.   [name] glipiZIDE (GLUCOTROL) 10 MG tablet Take 10 mg by mouth daily before breakfast.  . hydrochlorothiazide (HYDRODIURIL) 25 MG tablet Take 25 mg by mouth daily.  . metFORMIN (GLUCOPHAGE) 1000 MG tablet Take 1,000 mg by mouth 2 (two) times  daily with a meal.  . metoprolol (LOPRESSOR) 50 MG tablet Take 0.5 tablets (25 mg total) by mouth 2 (two) times daily.  . Multiple Vitamins-Minerals (MULTIVITAMIN ADULTS) TABS Take 1 tablet by mouth daily.  . naproxen (NAPROSYN) 500 MG tablet Take 500 mg by mouth every morning.   . NON FORMULARY Take 1 tablet by mouth daily. Seven seals for knee joint pain   No facility-administered encounter medications on file as of 09/30/2016.     Allergies: Review of patient's allergies indicates no known allergies.  Social History   Social History  . Marital status: Married    Spouse name: N/A  . Number of children: N/A  . Years of education: N/A   Occupational History  . Not on file.   Social History Main Topics  . Smoking status: Never Smoker  . Smokeless tobacco: Not on file  . Alcohol use No  . Drug use: No  . Sexual activity: Not on file   Other Topics Concern  . Not on file    Social History Narrative  . No narrative on file    No family history on file.  Review of Systems: A 12-system review of systems was performed and was negative except as noted in the HPI.  --------------------------------------------------------------------------------------------------  Physical Exam: There were no vitals taken for this visit.  General:  *** HEENT: No conjunctival pallor or scleral icterus.  Moist mucous membranes.  OP clear. Neck: Supple without lymphadenopathy, thyromegaly, JVD, or HJR.  No carotid bruit. Lungs: Normal work of breathing.  Clear to auscultation bilaterally without wheezes or crackles. Heart: Regular rate and rhythm without murmurs, rubs, or gallops.  Non-displaced PMI. Abd: Bowel sounds present.  Soft, NT/ND without hepatosplenomegaly Ext: No lower extremity edema.  Radial, PT, and DP pulses are 2+ bilaterally Skin: warm and dry without rash Neuro: CNIII-XII intact.  Strength and fine-touch sensation intact in upper and lower extremities bilaterally. Psych: Normal mood and affect.  EKG:  ***  Lab Results  Component Value Date   WBC 8.5 08/29/2016   HGB 14.6 08/29/2016   HCT 47.2 (H) 08/29/2016   MCV 96.5 08/29/2016   PLT 282 08/29/2016    Lab Results  Component Value Date   NA 140 08/29/2016   K 4.1 08/29/2016   CL 103 08/29/2016   CO2 29 08/29/2016   BUN 10 08/29/2016   CREATININE 0.51 08/29/2016   GLUCOSE 137 (H) 08/29/2016   ALT 19 05/04/2009    Lab Results  Component Value Date   CHOL  03/15/2009    123        ATP III CLASSIFICATION:  <200     mg/dL   Desirable  200-239  mg/dL   Borderline High  >=240    mg/dL   High          HDL 40 03/15/2009   LDLCALC  03/15/2009    66        Total Cholesterol/HDL:CHD Risk Coronary Heart Disease Risk Table                     Men   Women  1/2 Average Risk   3.4   3.3  Average Risk       5.0   4.4  2 X Average Risk   9.6   7.1  3 X Average Risk  23.4   11.0        Use the  calculated Patient Ratio above and the CHD Risk Table  to determine the patient's CHD Risk.        ATP III CLASSIFICATION (LDL):  <100     mg/dL   Optimal  100-129  mg/dL   Near or Above                    Optimal  130-159  mg/dL   Borderline  160-189  mg/dL   High  >190     mg/dL   Very High   TRIG 85 03/15/2009   CHOLHDL 3.1 03/15/2009     --------------------------------------------------------------------------------------------------  ASSESSMENT AND PLAN: Nelva Bush, MD 09/30/2016 1:22 PM

## 2016-10-11 ENCOUNTER — Encounter: Payer: Self-pay | Admitting: Nurse Practitioner

## 2016-10-11 ENCOUNTER — Ambulatory Visit (INDEPENDENT_AMBULATORY_CARE_PROVIDER_SITE_OTHER): Payer: No Typology Code available for payment source | Admitting: Nurse Practitioner

## 2016-10-11 ENCOUNTER — Encounter: Payer: Self-pay | Admitting: *Deleted

## 2016-10-11 VITALS — BP 152/76 | HR 88 | Ht 67.0 in | Wt 307.0 lb

## 2016-10-11 DIAGNOSIS — I10 Essential (primary) hypertension: Secondary | ICD-10-CM | POA: Insufficient documentation

## 2016-10-11 DIAGNOSIS — R0683 Snoring: Secondary | ICD-10-CM

## 2016-10-11 DIAGNOSIS — I480 Paroxysmal atrial fibrillation: Secondary | ICD-10-CM | POA: Insufficient documentation

## 2016-10-11 DIAGNOSIS — E119 Type 2 diabetes mellitus without complications: Secondary | ICD-10-CM

## 2016-10-11 LAB — TSH: TSH: 1.47 m[IU]/L

## 2016-10-11 MED ORDER — METOPROLOL TARTRATE 25 MG PO TABS: 25.0000 mg | ORAL_TABLET | Freq: Two times a day (BID) | ORAL | 6 refills | Status: DC

## 2016-10-11 NOTE — Patient Instructions (Addendum)
Medication Instructions:  START Metoprolol (Lopressor) 25mg  Take 1 tab twice a day  START ELIQUIS 5MG  Take 1 tablet by mouth twice a day  Labwork: Your physician recommends that you return for lab work in: [place] recommends that you return for lab work in: 1 MONTH-CBC  Testing/Procedures: Your physician has requested that you have an echocardiogram. Echocardiography is a painless test that uses sound waves to create images of your heart. It provides your doctor with information about the size and shape of your heart and how well your heart's chambers and valves are working. This procedure takes approximately one hour. There are no restrictions for this procedure.  Follow-Up: Your physician recommends that you schedule a follow-up appointment in: 1-2 MONTHS WITH THE AFIB CLINIC  Any Other Special Instructions Will Be Listed Below (If Applicable).  2 weeks of Eliquis samples provided to patient, will give patient assistance forms to [place] to send in   If you need a refill on your cardiac medications before your next appointment, please call your pharmacy.

## 2016-10-11 NOTE — Progress Notes (Signed)
Cardiology Clinic Note   Patient Name: Haley [name] Date of Encounter: 10/11/2016  Primary Care Provider:  Pcp Not In System Primary Cardiologist:  Adora Fridge, MD (last seen 2010)  Patient Profile    50 year old female with a prior history of paroxysmal atrial fibrillation in the setting of hyperthyroidism in 2010 who recently had recurrent paroxysmal atrial fibrillation.  Past Medical History    Past Medical History:  Diagnosis Date  . Essential hypertension   . Fibroids   . Hyperthyroidism    a. 2010, treated w/ tapazole.  . Morbid obesity ([place])   . Paroxysmal atrial fibrillation ([place])    a. 03/2009 - in setting of hyperthyroidism and caffeine intake, converted spontaneously;  b. 08/2016 ED visit for recurrent PAF-->successful DCCV in ED.  [name] Snores   . Type II diabetes mellitus ([place])    Past Surgical History:  Procedure Laterality Date  . UTERINE FIBROID SURGERY      Allergies  No Known Allergies  History of Present Illness    50 year old female with a prior history of hypertension, obesity, diabetes, hyperthyroidism, and paroxysmal atrial fibrillation. Paroxysmal atrial fibrillation was first diagnosed in 2010, at which time she presented [facility] with tachycardia, dyspnea, and chest pain. She was undergoing treatment for hyperthyroidism at the time. She converted spontaneously and was evaluated by cardiology and subsequently discharged. She thinks she may have been on Coumadin for a period of time but she's not sure. She says her hyperthyroidism was treated with Tapazole and this was subsequently discontinued. She has not had follow-up related to this in some time. She did not have any palpitations from 2010 until late September, when she developed palpitations with dyspnea and presented to the ED where she was found to be in A. fib. She converted spontaneously to sinus rhythm. She was advised to begin taking eliquis and was also prescribed Lopressor 25 mg twice a  day. She has not been taking either of these. She has had no recurrent palpitations. She denies chest pain, PND, orthopnea, dizziness, syncope, edema, or early satiety. She has been trying to increase her activity and has not had any significant limitations or significant dyspnea on exertion.  Home Medications    Prior to Admission medications   Medication Sig Start Date End Date Taking? Authorizing Provider  cyclobenzaprine (FLEXERIL) 10 MG tablet Take 10 mg by mouth at bedtime as needed for muscle spasms.    Yes Historical Provider, MD  glipiZIDE (GLUCOTROL) 10 MG tablet Take 10 mg by mouth daily before breakfast.   Yes Historical Provider, MD  hydrochlorothiazide (HYDRODIURIL) 25 MG tablet Take 25 mg by mouth daily.   Yes Historical Provider, MD  metFORMIN (GLUCOPHAGE) 1000 MG tablet Take 1,000 mg by mouth 2 (two) times daily with a meal.   Yes Historical Provider, MD  Multiple Vitamins-Minerals (MULTIVITAMIN ADULTS) TABS Take 1 tablet by mouth daily.   Yes Historical Provider, MD  naproxen (NAPROSYN) 500 MG tablet Take 500 mg by mouth every morning.    Yes Historical Provider, MD  NON FORMULARY Take 1 tablet by mouth daily. Seven seals for knee joint pain   Yes Historical Provider, MD  metoprolol (LOPRESSOR) 25 MG tablet Take 1 tablet (25 mg total) by mouth 2 (two) times daily. 10/11/16 11/10/16  Rogelia Mire, NP    Family History    Both parents are alive in their 96s. She does not communicate with her father. Her mother has back trouble. She has 2 sisters who  are alive and well.  Social History    Social History   Social History  . Marital status: Married    Spouse name: N/A  . Number of children: N/A  . 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 Smoking status: Former Smoker    Packs/day: 1.00    Years: 32.00    Quit date: 2012  . Smokeless tobacco: Never Used  . Alcohol use No     Comment: occasional drink.  .  Drug use: No  . Sexual activity: Not on file   Other Topics Concern  . 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  Does not routinely exercise.     Review of Systems    General:  No chills, fever, night sweats or weight changes.  Cardiovascular:  No chest pain, dyspnea on exertion, edema, orthopnea, palpitations, paroxysmal nocturnal dyspnea. Dermatological: No rash, lesions/masses Respiratory: No cough, dyspnea Urologic: No hematuria, dysuria Abdominal:   No nausea, vomiting, diarrhea, bright red blood per rectum, melena, or hematemesis Neurologic:  No visual changes, wkns, changes in mental status. All other systems reviewed and are otherwise negative except as noted above.  Physical Exam    VS:  BP (!) 152/76   Pulse 88   Ht 5' 7" (1.702 m)   Wt (!) 307 lb (139.3 kg)   BMI 48.08 kg/m  , BMI Body mass index is 48.08 kg/m. GEN: Well nourished, well developed, in no acute distress.  HEENT: normal.  Neck: Supple, no JVD, carotid bruits, or masses. Cardiac: RRR, no murmurs, rubs, or gallops. No clubbing, cyanosis, edema.  Radials/DP/PT 2+ and equal bilaterally.  Respiratory:  Respirations regular and unlabored, clear to auscultation bilaterally. GI: Soft, nontender, nondistended, BS + x 4. MS: no deformity or atrophy. Skin: warm and dry, no rash. Neuro:  Strength and sensation are intact. Psych: Normal affect.  Accessory Clinical Findings    ECG - Sinus rhythm, 88, no acute ST or T changes.  Assessment & Plan   1.  Paroxysmal atrial fibrillation: Patient has a prior history of paroxysmal atrial fibrillation dating back to at least 2010 when she was being treated for hyperthyroidism and had an episode of PAF requiring hospitalization at [facility]. At that time, she was seen by Dr. Gwenlyn Found. She subsequently underwent treatment for her hyperthyroidism. She thinks she may have been on  Coumadin for some period of time though isn't sure. She was recently seen back in the emergency department with recurrent A. fib which broke spontaneously. She has had no recurrence that she is aware of since then. She was prescribed eliquis and metoprolol however has not been taking these. She is not sure if she can afford eliquis.  Her CHA2DS2VASc is 3. The pharmacist met with her today and will assist her in filling out forms for financial assistance to obtain eliquis. In the meantime, we have provided samples. We have also sent a prescription for Lopressor 25 mg twice a day. I will check a TSH as this was not performed in the ED. I will also arrange for 2-D echocardiogram and sleep study.  2. Essential hypertension: Blood pressure is currently elevated. I'm prescribing beta blocker as above. She is otherwise on HCTZ.  3. Type 2 diabetes mellitus: Followed by primary care and taking metformin.  4. Morbid obesity:  We discussed the role of obesity likely place in the development of atrial fibrillation. Caloric restriction and increase activity encouraged. She is hoping to lose weight.  5. Snoring: She says sometimes she is awakened by her own snoring. With diagnosis of A. fib, I will arrange for a sleep study.  6. Disposition: Follow-up TSH, echo, and sleep study. Follow-up CBC in 1 month given initiation of eliquis. We will arrange for follow-up in A. fib clinic going forward.   Murray Hodgkins, NP 10/11/2016, 6:02 PM

## 2016-10-12 ENCOUNTER — Telehealth: Payer: Self-pay | Admitting: Nurse Practitioner

## 2016-10-12 ENCOUNTER — Telehealth: Payer: Self-pay | Admitting: Pharmacist

## 2016-10-12 NOTE — Telephone Encounter (Signed)
Pt would like her lab results from yesterday please.

## 2016-10-12 NOTE — Telephone Encounter (Signed)
Pt presented for visit with Ignacia Bayley, NP she is self pay and requesting assistance with Eliquis.   She prefers not to change to warfarin due to a hectic schedule and copays associated with INR check.   She filled out paperwork for PA through drug company and will bring required documents to be faxed with application.

## 2016-10-12 NOTE — Telephone Encounter (Signed)
Per result note: tsh is normal.  Relayed result to pt. Nothing else needed per pt

## 2016-10-12 NOTE — Telephone Encounter (Signed)
Relayed result to pt. nothing else needed per pt

## 2016-11-02 ENCOUNTER — Ambulatory Visit (HOSPITAL_COMMUNITY): Payer: No Typology Code available for payment source | Attending: Cardiology

## 2016-11-02 ENCOUNTER — Other Ambulatory Visit: Payer: Self-pay

## 2016-11-02 DIAGNOSIS — I480 Paroxysmal atrial fibrillation: Secondary | ICD-10-CM

## 2016-11-02 DIAGNOSIS — E119 Type 2 diabetes mellitus without complications: Secondary | ICD-10-CM | POA: Insufficient documentation

## 2016-11-02 DIAGNOSIS — I10 Essential (primary) hypertension: Secondary | ICD-10-CM | POA: Insufficient documentation

## 2016-11-02 DIAGNOSIS — Z6841 Body Mass Index (BMI) 40.0 and over, adult: Secondary | ICD-10-CM | POA: Insufficient documentation

## 2016-11-02 DIAGNOSIS — E669 Obesity, unspecified: Secondary | ICD-10-CM | POA: Insufficient documentation

## 2016-11-04 ENCOUNTER — Telehealth: Payer: Self-pay | Admitting: Nurse Practitioner

## 2016-11-04 NOTE — Telephone Encounter (Signed)
Pt is calling to get test results

## 2016-11-04 NOTE — Telephone Encounter (Signed)
PT AWARE OF ECHO RESULTS./CY 

## 2016-12-07 ENCOUNTER — Ambulatory Visit (HOSPITAL_COMMUNITY)
Admission: RE | Admit: 2016-12-07 | Discharge: 2016-12-07 | Disposition: A | Discharge status: Home / Self Care | Payer: No Typology Code available for payment source | Source: Ambulatory Visit | Attending: Nurse Practitioner | Admitting: Nurse Practitioner

## 2016-12-07 ENCOUNTER — Encounter (HOSPITAL_COMMUNITY): Payer: Self-pay | Admitting: Nurse Practitioner

## 2016-12-07 VITALS — BP 118/60 | HR 79 | Ht 67.0 in | Wt 304.0 lb

## 2016-12-07 DIAGNOSIS — Z7984 Long term (current) use of oral hypoglycemic drugs: Secondary | ICD-10-CM | POA: Insufficient documentation

## 2016-12-07 DIAGNOSIS — Z7901 Long term (current) use of anticoagulants: Secondary | ICD-10-CM | POA: Insufficient documentation

## 2016-12-07 DIAGNOSIS — I480 Paroxysmal atrial fibrillation: Secondary | ICD-10-CM | POA: Insufficient documentation

## 2016-12-07 DIAGNOSIS — E119 Type 2 diabetes mellitus without complications: Secondary | ICD-10-CM | POA: Insufficient documentation

## 2016-12-07 DIAGNOSIS — Z87891 Personal history of nicotine dependence: Secondary | ICD-10-CM | POA: Insufficient documentation

## 2016-12-07 DIAGNOSIS — E0590 Thyrotoxicosis, unspecified without thyrotoxic crisis or storm: Secondary | ICD-10-CM | POA: Insufficient documentation

## 2016-12-07 DIAGNOSIS — I10 Essential (primary) hypertension: Secondary | ICD-10-CM | POA: Insufficient documentation

## 2016-12-07 MED ORDER — APIXABAN 5 MG PO TABS: 5.0000 mg | ORAL_TABLET | Freq: Two times a day (BID) | ORAL | 3 refills | Status: DC

## 2016-12-07 NOTE — Patient Instructions (Signed)
Take Eliquis twice a day

## 2016-12-08 NOTE — Progress Notes (Addendum)
Primary Care Physician: Kerin Perna, NP Referring Physician: Ignacia Bayley, NP   Haley [name] is a 51 y.o. female with a h/o of obesity, HTN, DM that is in the atrial fibrillation clinic for further evaluation of atrial fibrillation.. Has a remote h/o of atrial fibrillation at time of treatment of hyperthyroidism.. Was seen in ER with sudden onset of afib with RVR, 9/30, and had f/u with Ignacia Bayley, NP, 10/11/16. At that time she had not been taking lopressor or eliquis and he encouraged her to restart. She stated that she was unable to afford eliquis. She was given samples and PharmD assisted her that day to fill out forms for financial assistance to obtain drug. A sleep study was ordered ans till is pending.TSH was checked and WNL. CHA2DS2VASc score is at least 3.  In the clinic today, she is found to be taking lopressor and eliquis only once a day. States she did not realized the dosing was BID. She lost the assistance forms that were initiated for her. She however has not noted any afib. She does vape, no significant alcohol of caffeine. Sleep study scheduled 1/9 for h/o snoring.  Today, she denies symptoms of palpitations, chest pain, shortness of breath, orthopnea, PND, lower extremity edema, dizziness, presyncope, syncope, or neurologic sequela. The patient is tolerating medications without difficulties and is otherwise without complaint today.   Past Medical History:  Diagnosis Date  . Essential hypertension   . Fibroids   . Hyperthyroidism    a. 2010, treated w/ tapazole.  . Morbid obesity ([place])   . Paroxysmal atrial fibrillation ([place])    a. 03/2009 - in setting of hyperthyroidism and caffeine intake, converted spontaneously;  b. 08/2016 ED visit for recurrent PAF-->successful DCCV in ED.  [name] Snores   . Type II diabetes mellitus ([place])    Past Surgical History:  Procedure Laterality Date  . UTERINE FIBROID SURGERY      Current Outpatient Prescriptions  Medication Sig  Dispense Refill  . apixaban (ELIQUIS) 5 MG TABS tablet Take 1 tablet (5 mg total) by mouth 2 (two) times daily. 180 tablet 3  . Cholecalciferol (VITAMIN D) 2000 units CAPS Take by mouth.    . cyclobenzaprine (FLEXERIL) 10 MG tablet Take 10 mg by mouth at bedtime as needed for muscle spasms.     [name] glipiZIDE (GLUCOTROL) 10 MG tablet Take 10 mg by mouth daily before breakfast.    . hydrochlorothiazide (HYDRODIURIL) 25 MG tablet Take 25 mg by mouth daily.    . metFORMIN (GLUCOPHAGE) 1000 MG tablet Take 1,000 mg by mouth 2 (two) times daily with a meal.    . metoprolol tartrate (LOPRESSOR) 25 MG tablet Take 25 mg by mouth daily.    . Multiple Vitamins-Minerals (MULTIVITAMIN ADULTS) TABS Take 1 tablet by mouth daily.    . naproxen (NAPROSYN) 500 MG tablet Take 500 mg by mouth every morning.     . NON FORMULARY Take 1 tablet by mouth daily. Seven seals for knee joint pain    . NON FORMULARY Less stress weight control     No current facility-administered medications for this encounter.     No Known Allergies  Social History   Social History  . Marital status: Married    Spouse name: N/A  . Number of children: N/A  . 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 Smoking status: Former Smoker    Packs/day: 1.00  Years: 32.00    Quit date: 2012  . Smokeless tobacco: Never Used     Comment: patient vapes  . Alcohol use No     Comment: occasional drink.  . Drug use: No  . Sexual activity: Not on file   Other Topics Concern  . 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  Does not routinely exercise.    No family history on file.  ROS- All systems are reviewed and negative except as per the HPI above  Physical Exam: Vitals:   12/07/16 1038  BP: 118/60  Pulse: 79  Weight: (!) 304 lb (137.9 kg)  Height: 5\' 7"  (1.702 m)   Wt  Readings from Last 3 Encounters:  12/07/16 (!) 304 lb (137.9 kg)  10/11/16 (!) 307 lb (139.3 kg)  08/29/16 (!) 306 lb 7 oz (139 kg)    Labs: Lab Results  Component Value Date   NA 140 08/29/2016   K 4.1 08/29/2016   CL 103 08/29/2016   CO2 29 08/29/2016   GLUCOSE 137 (H) 08/29/2016   BUN 10 08/29/2016   CREATININE 0.51 08/29/2016   CALCIUM 9.3 08/29/2016   MG 1.8 08/29/2016   Lab Results  Component Value Date   INR 1.0 03/15/2009   Lab Results  Component Value Date   CHOL  03/15/2009    123        ATP III CLASSIFICATION:  <200     mg/dL   Desirable  200-239  mg/dL   Borderline High  >=240    mg/dL   High          HDL 40 03/15/2009   LDLCALC  03/15/2009    66        Total Cholesterol/HDL:CHD Risk Coronary Heart Disease Risk Table                     Men   Women  1/2 Average Risk   3.4   3.3  Average Risk       5.0   4.4  2 X Average Risk   9.6   7.1  3 X Average Risk  23.4   11.0        Use the calculated Patient Ratio above and the CHD Risk Table to determine the patient's CHD Risk.        ATP III CLASSIFICATION (LDL):  <100     mg/dL   Optimal  100-129  mg/dL   Near or Above                    Optimal  130-159  mg/dL   Borderline  160-189  mg/dL   High  >190     mg/dL   Very High   TRIG 85 03/15/2009     GEN- The patient is well appearing, alert and oriented x 3 today.   Head- normocephalic, atraumatic Eyes-  Sclera clear, conjunctiva pink Ears- hearing intact Oropharynx- clear Neck- supple, no JVP Lymph- no cervical lymphadenopathy Lungs- Clear to ausculation bilaterally, normal work of breathing Heart- Regular rate and rhythm, no murmurs, rubs or gallops, PMI not laterally displaced GI- soft, NT, ND, + BS Extremities- no clubbing, cyanosis, or edema MS- no significant deformity or atrophy Skin- no rash or lesion Psych- euthymic mood, full affect Neuro- strength and sensation are intact  EKG- NSR at 79 bpm, pr  int 138 ms, qrs int 84 ms, qtc  435 ms Epic records reviewed Echo-Study Conclusions  - Left ventricle: The cavity size was normal. Wall thickness was   increased in a pattern of mild LVH. There was mild focal basal   hypertrophy of the septum. Systolic function was normal. The   estimated ejection fraction was in the range of 60% to 65%.   Doppler parameters are consistent with abnormal left ventricular   relaxation (grade 1 diastolic dysfunction). - Left atrium: The atrium was mildly dilated.    Assessment and Plan: 1. Paroxsymal afib General education of afib discussed focusing on lifestyle Encouraged to go thru with plans for sleep study Informed that lopressor and eliquis are bid drugs and to take correctly Patient assistance forms again filled out and pt encouraged to complete Continue eliquis 5 mg bid Bleeding precautions discussed Continue lopressor 25 mg mg bid Exercise program and weight loss encouraged  F/u in one month in afib clinic   Donna C. Carroll, [facility] [address] [place], [place] [address] [phone]

## 2016-12-13 ENCOUNTER — Ambulatory Visit (HOSPITAL_BASED_OUTPATIENT_CLINIC_OR_DEPARTMENT_OTHER): Payer: No Typology Code available for payment source | Attending: Nurse Practitioner | Admitting: Cardiovascular Disease

## 2016-12-13 VITALS — Ht 67.0 in | Wt 309.0 lb

## 2016-12-13 DIAGNOSIS — G4733 Obstructive sleep apnea (adult) (pediatric): Secondary | ICD-10-CM

## 2016-12-13 DIAGNOSIS — R0683 Snoring: Secondary | ICD-10-CM | POA: Insufficient documentation

## 2016-12-26 NOTE — Procedures (Signed)
Patient Name: Haley [name], Haley [name] Date: 12/13/2016 Gender: Female D.O.B: November 25, 1966 Age (years): 11 Referring Provider: Murray Hodgkins Height (inches): 53 Interpreting Physician: [name] MD, ABSM Weight (lbs): 308 RPSGT: Carolin Coy BMI: 48 MRN: [id number] Neck Size: 18.00  CLINICAL INFORMATION Sleep Study Type: NPSG  Indication for sleep study: Diabetes, Fatigue, Hypertension, Obesity, Snoring, Atrial Fibrillation  Epworth Sleepiness Score: 11  SLEEP STUDY TECHNIQUE As per the AASM Manual for the Scoring of Sleep and Associated Events v2.3 (April 2016) with a hypopnea requiring 4% desaturations.  The channels recorded and monitored were frontal, central and occipital EEG, electrooculogram (EOG), submentalis EMG (chin), nasal and oral airflow, thoracic and abdominal wall motion, anterior tibialis EMG, snore microphone, electrocardiogram, and pulse oximetry.  MEDICATIONS apixaban (ELIQUIS) 5 MG TABS tablet Cholecalciferol (VITAMIN D) 2000 units CAPS cyclobenzaprine (FLEXERIL) 10 MG tablet glipiZIDE (GLUCOTROL) 10 MG tablet hydrochlorothiazide (HYDRODIURIL) 25 MG tablet metFORMIN (GLUCOPHAGE) 1000 MG tablet metoprolol tartrate (LOPRESSOR) 25 MG tablet Multiple Vitamins-Minerals (MULTIVITAMIN ADULTS) TABS naproxen (NAPROSYN) 500 MG tablet NON FORMULARY NON FORMULARY  Medications self-administered by patient taken the night of the study : TYLENOL PM  SLEEP ARCHITECTURE The study was initiated at 11:06:53 PM and ended at 5:41:27 AM.  Sleep onset time was 35.9 minutes and the sleep efficiency was 76.4%. The total sleep time was 301.5 minutes. Wake after sleep onset (WASO) was 57.2 minutes.  Stage REM latency was 154.0 minutes.  The patient spent 9.78% of the night in stage N1 sleep, 74.13% in stage N2 sleep, 0.00% in stage N3 and 16.09% in REM.  Alpha intrusion was absent.  Supine sleep was 0.00%.  RESPIRATORY PARAMETERS The overall  apnea/hypopnea index (AHI) was 111.2 per hour. There were 427 total apneas, including 416 obstructive, 9 central and 2 mixed apneas. There were 132 hypopneas and 9 RERAs.  The AHI during Stage REM sleep was 74.2 per hour.  AHI while supine was N/A per hour.  The mean oxygen saturation was 86.17%. The minimum SpO2 during sleep was 51.00%.  Moderate snoring was noted during this study.  CARDIAC DATA The 2 lead EKG demonstrated sinus rhythm. The mean heart rate was 69.73 beats per minute. Other EKG findings include: None.  LEG MOVEMENT DATA The total PLMS were 2 with a resulting PLMS index of 0.40. Associated arousal with leg movement index was 0.0 .  IMPRESSIONS - Severe obstructive sleep apnea occurred during this study (AHI = 111.2/h). - No significant central sleep apnea occurred during this study (CAI = 1.8/h). - Severe oxygen desaturation to a nadir of 51.00%. The patient was started on supplemental oxygen at 1 l/m and was titrated up to 2 L/m. - Reduced sleep efficiency. - Abnormal sleep architecture with absence of slow-wave sleep and prolonged latency to REM sleep. - Moderate snoring volume. - No cardiac abnormalities were noted during this study. - Clinically significant periodic limb movements did not occur during sleep. No significant associated arousals.  DIAGNOSIS - Obstructive Sleep Apnea (327.23 [G47.33 ICD-10]) - Nocturnal Hypoxemia (327.26 [G47.36 ICD-10])  RECOMMENDATIONS - Recommend expeditious CPAP titration in this patient with severe sleep apnea and oxygen desaturation to determine optimal pressure required to alleviate sleep disordered breathing. - Efforts should be made to optimize nasal and oropharyngeal patency. - Avoid alcohol, sedatives and other CNS depressants that may worsen sleep apnea and disrupt normal sleep architecture. - Sleep hygiene should be reviewed to assess factors that may improve sleep quality. - Weight management (BMI 48) and regular  exercise should be  initiated.   [Electronically signed] 12/26/2016 06:13 PM  [name] MD, [facility], ABSM Diplomate, American Board of Sleep Medicine   NPI: [id number] [place] PH: [phone]   FX: [phone] [place]

## 2016-12-30 ENCOUNTER — Other Ambulatory Visit: Payer: Self-pay | Admitting: *Deleted

## 2016-12-30 ENCOUNTER — Telehealth: Payer: Self-pay | Admitting: *Deleted

## 2016-12-30 DIAGNOSIS — R0902 Hypoxemia: Secondary | ICD-10-CM

## 2016-12-30 DIAGNOSIS — G4733 Obstructive sleep apnea (adult) (pediatric): Secondary | ICD-10-CM

## 2016-12-30 NOTE — Telephone Encounter (Signed)
-----   Message from Troy Sine, MD sent at 12/26/2016  6:18 PM EST ----- Mariann Laster please schedule patient for an expeditious CPAP titration study in this patient with very severe sleep apnea and severe oxygen desaturation.

## 2016-12-30 NOTE — Telephone Encounter (Signed)
Patient notified of sleep study results and recommendations. She voiced verbal understanding. Agreed with plan to proceed with titration study.

## 2016-12-30 NOTE — Telephone Encounter (Signed)
CPAP titration study scheduled for January 31st. Patient notified.

## 2017-01-04 ENCOUNTER — Ambulatory Visit (HOSPITAL_BASED_OUTPATIENT_CLINIC_OR_DEPARTMENT_OTHER)
Payer: No Typology Code available for payment source | Attending: Cardiovascular Disease | Admitting: Cardiovascular Disease

## 2017-01-04 VITALS — Ht 65.0 in | Wt 314.0 lb

## 2017-01-04 DIAGNOSIS — G4736 Sleep related hypoventilation in conditions classified elsewhere: Secondary | ICD-10-CM | POA: Insufficient documentation

## 2017-01-04 DIAGNOSIS — R0902 Hypoxemia: Secondary | ICD-10-CM

## 2017-01-04 DIAGNOSIS — R0683 Snoring: Secondary | ICD-10-CM | POA: Insufficient documentation

## 2017-01-04 DIAGNOSIS — G4733 Obstructive sleep apnea (adult) (pediatric): Secondary | ICD-10-CM | POA: Insufficient documentation

## 2017-01-04 DIAGNOSIS — Z79899 Other long term (current) drug therapy: Secondary | ICD-10-CM | POA: Insufficient documentation

## 2017-01-04 DIAGNOSIS — Z7901 Long term (current) use of anticoagulants: Secondary | ICD-10-CM | POA: Insufficient documentation

## 2017-01-04 DIAGNOSIS — Z7984 Long term (current) use of oral hypoglycemic drugs: Secondary | ICD-10-CM | POA: Insufficient documentation

## 2017-01-10 ENCOUNTER — Encounter (HOSPITAL_COMMUNITY): Payer: Self-pay | Admitting: Nurse Practitioner

## 2017-01-10 ENCOUNTER — Ambulatory Visit (HOSPITAL_COMMUNITY)
Admission: RE | Admit: 2017-01-10 | Discharge: 2017-01-10 | Disposition: A | Discharge status: Home / Self Care | Payer: No Typology Code available for payment source | Source: Ambulatory Visit | Attending: Nurse Practitioner | Admitting: Nurse Practitioner

## 2017-01-10 VITALS — BP 118/84 | HR 76 | Ht 65.0 in | Wt 303.0 lb

## 2017-01-10 DIAGNOSIS — Z7984 Long term (current) use of oral hypoglycemic drugs: Secondary | ICD-10-CM | POA: Insufficient documentation

## 2017-01-10 DIAGNOSIS — Z7901 Long term (current) use of anticoagulants: Secondary | ICD-10-CM | POA: Insufficient documentation

## 2017-01-10 DIAGNOSIS — G4733 Obstructive sleep apnea (adult) (pediatric): Secondary | ICD-10-CM

## 2017-01-10 DIAGNOSIS — Z87891 Personal history of nicotine dependence: Secondary | ICD-10-CM | POA: Insufficient documentation

## 2017-01-10 DIAGNOSIS — E119 Type 2 diabetes mellitus without complications: Secondary | ICD-10-CM | POA: Insufficient documentation

## 2017-01-10 DIAGNOSIS — I10 Essential (primary) hypertension: Secondary | ICD-10-CM | POA: Insufficient documentation

## 2017-01-10 DIAGNOSIS — E0590 Thyrotoxicosis, unspecified without thyrotoxic crisis or storm: Secondary | ICD-10-CM | POA: Insufficient documentation

## 2017-01-10 DIAGNOSIS — I480 Paroxysmal atrial fibrillation: Secondary | ICD-10-CM

## 2017-01-10 DIAGNOSIS — Z6841 Body Mass Index (BMI) 40.0 and over, adult: Secondary | ICD-10-CM | POA: Insufficient documentation

## 2017-01-10 MED ORDER — METOPROLOL TARTRATE 25 MG PO TABS: 25.0000 mg | ORAL_TABLET | Freq: Every day | ORAL | 6 refills | Status: DC

## 2017-01-10 NOTE — Progress Notes (Signed)
Primary Care Physician: Kerin Perna, NP Primary Cardiologist: Haley [name] is a 51 y.o. female with a history of paroxysmal atrial fibrillation who presents for follow up in the [facility].  Since last being seen in clinic, the patient reports doing reasonably well.  She has been diagnosed with severe OSA but has not yet received CPAP equipment. She continues with shortness of breath, fatigue, exercise intolerance, and occasional orthostatic dizziness. Today, she denies symptoms of palpitations, chest pain, orthopnea, PND, lower extremity edema, presyncope, syncope, bleeding, or neurologic sequela. The patient is tolerating medications without difficulties and is otherwise without complaint today.    Atrial Fibrillation Risk Factors:  she does have symptoms or diagnosis of sleep apnea. she has not yet received CPAP    she does not have a history of rheumatic fever.  she does not have a history of alcohol use.  she has a BMI of Body mass index is 50.42 kg/m.[name] Filed Weights   01/10/17 1037  Weight: (!) 303 lb (137.4 kg)    LA size: 34   Atrial Fibrillation Management history:  Previous antiarrhythmic drugs: none  Previous cardioversions: none  Previous ablations: none  CHADS2VASC score: 3  Anticoagulation history: Eliquis   Past Medical History:  Diagnosis Date  . Essential hypertension   . Fibroids   . Hyperthyroidism    a. 2010, treated w/ tapazole.  . Morbid obesity ([place])   . Paroxysmal atrial fibrillation ([place])    a. 03/2009 - in setting of hyperthyroidism and caffeine intake, converted spontaneously;  b. 08/2016 ED visit for recurrent PAF-->successful DCCV in ED.  [name] Snores   . Type II diabetes mellitus ([place])    Past Surgical History:  Procedure Laterality Date  . UTERINE FIBROID SURGERY      Current Outpatient Prescriptions  Medication Sig Dispense Refill  . apixaban (ELIQUIS) 5 MG TABS tablet Take 1  tablet (5 mg total) by mouth 2 (two) times daily. 180 tablet 3  . Cholecalciferol (VITAMIN D) 2000 units CAPS Take by mouth.    [name] glipiZIDE (GLUCOTROL) 10 MG tablet Take 10 mg by mouth daily before breakfast.    . hydrochlorothiazide (HYDRODIURIL) 25 MG tablet Take 25 mg by mouth daily.    . metFORMIN (GLUCOPHAGE) 1000 MG tablet Take 1,000 mg by mouth 2 (two) times daily with a meal.    . metoprolol tartrate (LOPRESSOR) 25 MG tablet Take 1 tablet (25 mg total) by mouth daily. 30 tablet 6  . naproxen (NAPROSYN) 500 MG tablet Take 500 mg by mouth every morning.     . NON FORMULARY Take 1 tablet by mouth daily. Seven seals for knee joint pain    . cyclobenzaprine (FLEXERIL) 10 MG tablet Take 10 mg by mouth at bedtime as needed for muscle spasms.     . Multiple Vitamins-Minerals (MULTIVITAMIN ADULTS) TABS Take 1 tablet by mouth daily.    . NON FORMULARY Less stress weight control     No current facility-administered medications for this encounter.     No Known Allergies  Social History   Social History  . Marital status: Married    Spouse name: N/A  . Number of children: N/A  . 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 Smoking status: Former Smoker    Packs/day: 1.00    Years: 32.00    Quit date: 2012  . Smokeless tobacco: Never  Used     Comment: patient vapes  . Alcohol use No     Comment: occasional drink.  . Drug use: No  . Sexual activity: Not on file   Other Topics Concern  . 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  Does not routinely exercise.    No family history on file.  ROS- All systems are reviewed and negative except as per the HPI above.  Physical Exam: Vitals:   01/10/17 1037  BP: 118/84  Pulse: 76  Weight: (!) 303 lb (137.4 kg)  Height: 5\' 5"  (1.651 m)    GEN- The patient is morbidly obese  appearing, alert and oriented x 3 today.   Head- normocephalic, atraumatic Eyes-  Sclera clear, conjunctiva pink Ears- hearing intact Oropharynx- clear Neck- supple  Lungs- Clear to ausculation bilaterally, normal work of breathing Heart- Regular rate and rhythm  GI- soft, NT, ND, + BS Extremities- no clubbing, cyanosis, or edema MS- no significant deformity or atrophy Skin- no rash or lesion Psych- euthymic mood, full affect Neuro- strength and sensation are intact  Wt Readings from Last 3 Encounters:  01/10/17 (!) 303 lb (137.4 kg)  01/04/17 (!) 314 lb (142.4 kg)  12/13/16 (!) 309 lb (140.2 kg)    EKG today demonstrates sinus rhythm Echo 10/2016 demonstrated EF 123456, grade 1 diastolic dysfunction  Epic records are reviewed at length today  Assessment and Plan:  1. Paroxysmal atrial fibrillation Doing well on metoprolol Continue Eliquis for CHADS2VASC of 3  2. Morbid obesity Body mass index is 50.42 kg/m. Lifestyle modification was discussed at length including regular exercise and weight reduction.  3. Obstructive sleep apnea The importance of adequate treatment of sleep apnea was discussed today in order to improve our ability to maintain sinus rhythm long term. She has not yet received CPAP Message sent to Dr Evette Georges nurse to help assist with expediting equipment  4.  HTN Stable No change required today   Follow up with AF clinic 3 months   Chanetta Marshall, NP 01/10/2017 11:02 AM

## 2017-01-14 DIAGNOSIS — G4730 Sleep apnea, unspecified: Secondary | ICD-10-CM | POA: Insufficient documentation

## 2017-01-14 DIAGNOSIS — G4733 Obstructive sleep apnea (adult) (pediatric): Secondary | ICD-10-CM | POA: Insufficient documentation

## 2017-01-14 DIAGNOSIS — R0902 Hypoxemia: Secondary | ICD-10-CM | POA: Insufficient documentation

## 2017-01-14 NOTE — Procedures (Signed)
Patient Name: Haley [name], Haley [name] Date: 01/04/2017 Gender: Female D.O.B: 1966-05-20 Age (years): 50 Referring Provider: Murray Hodgkins Height (inches): 3 Interpreting Physician: [name] MD, ABSM Weight (lbs): 308 RPSGT: Carolin Coy BMI: 48 MRN: [id number] Neck Size: 18.00  CLINICAL INFORMATION The patient is referred for a split night study with BPAP. Most recent polysomnogram dated 12/13/2016 revealed an AHI of 111.2/h and RDI of 113.0/h. AHI during REM sleep 74.2/h;  oxygen desaturation to 51%.  MEDICATIONS apixaban (ELIQUIS) 5 MG TABS tablet Cholecalciferol (VITAMIN D) 2000 units CAPS cyclobenzaprine (FLEXERIL) 10 MG tablet glipiZIDE (GLUCOTROL) 10 MG tablet hydrochlorothiazide (HYDRODIURIL) 25 MG tablet metFORMIN (GLUCOPHAGE) 1000 MG tablet metoprolol tartrate (LOPRESSOR) 25 MG tablet Multiple Vitamins-Minerals (MULTIVITAMIN ADULTS) TABS naproxen (NAPROSYN) 500 MG tablet  Medications self-administered by patient taken the night of the study : TYLENOL PM  SLEEP STUDY TECHNIQUE As per the AASM Manual for the Scoring of Sleep and Associated Events v2.3 (April 2016) with a hypopnea requiring 4% desaturations.  The channels recorded and monitored were frontal, central and occipital EEG, electrooculogram (EOG), submentalis EMG (chin), nasal and oral airflow, thoracic and abdominal wall motion, anterior tibialis EMG, snore microphone, electrocardiogram, and pulse oximetry. Bi-level positive airway pressure (BiPAP) was initiated when the patient met split night criteria and was titrated according to treat sleep-disordered breathing.  RESPIRATORY PARAMETERS   Total AHI (/hr): 43.6 RDI (/hr): 44.7 OA Index (/hr): 7.2 CA Index (/hr): 0.7 REM AHI (/hr): 32.2 NREM AHI (/hr): 53.9 Supine AHI (/hr): 8.0 Non-supine AHI (/hr): 56.67 Min O2 Sat (%): 67.00 Mean O2 (%): 86.20 Time below 88% (min): 162.3      Titration Optimal IPAP Pressure (cm): 25 Optimal EPAP  Pressure (cm): 21 AHI at Optimal Pressure (/hr): 0.0 Min O2 at Optimal Pressure (%): 87.0 Sleep % at Optimal (%): 99 Supine % at Optimal (%): 0          SLEEP ARCHITECTURE The study was initiated at 10:58:21 PM and terminated at 5:52:53 AM. The total recorded time was 414.5 minutes. EEG confirmed total sleep time was 265.5 minutes yielding a sleep efficiency of 64.0%. Sleep onset after lights out was 131.9 minutes with a REM latency of 61.0 minutes. The patient spent 3.58% of the night in stage N1 sleep, 49.91% in stage N2 sleep, 0.19% in stage N3 and 46.33% in REM. Wake after sleep onset (WASO) was 17.1 minutes. The Arousal Index was 6.6/hour.  LEG MOVEMENT DATA The total Periodic Limb Movements of Sleep (PLMS) were 4. The PLMS index was 0.90 .  CARDIAC DATA The 2 lead EKG demonstrated sinus rhythm. The mean heart rate was 73.45 beats per minute. Other EKG findings include: None.  IMPRESSIONS - In this patient with very severe obstructive sleep apnea CPAP was initiated at 5 cm and was titrated to 19 cm water pressure. Due to continued events, Bi-PAP was started at 21/17 and was titrated to 25/21.  At 23/19 AHI was still elevated at 16.4; at 25/21 AHI was 0, and oxygen nadir was 87%. - No significant central sleep apnea occurred during the diagnostic portion of the study (CAI = 0.7/hour). - The patient snored with Moderate snoring volume which resolved at high pressures. - No cardiac abnormalities were noted during this study. - Clinically significant periodic limb movements of sleep did not occur during the study.  DIAGNOSIS - Obstructive Sleep Apnea (327.23 [G47.33 ICD-10])  RECOMMENDATIONS - Recommend an initial trial of BiPAP therapy  with Bi-Flex/EPR at 25/21 cm H2O with heated humidification.  A Large size Philips Respironics Full Face Mask Amara View mask was used for the titration. - Efforts should be made to optimize nasal and oropharyngeal patency. - Avoid alcohol, sedatives and  other CNS depressants that may worsen sleep apnea and disrupt normal sleep architecture. - Sleep hygiene should be reviewed to assess factors that may improve sleep quality. - Weight management (BMI 48) and regular exercise should be initiated or continued. - Recommend a download be obtained in 30 days and sleep clinic evaluation.  [Electronically signed] 01/14/2017 02:56 PM  [name] MD, [facility] Diplomate, American Board of Sleep Medicine   NPI: [id number] [place] PH: [phone]   FX: [phone] [place]

## 2017-01-18 ENCOUNTER — Telehealth: Payer: Self-pay | Admitting: *Deleted

## 2017-01-18 ENCOUNTER — Telehealth (HOSPITAL_COMMUNITY): Payer: Self-pay | Admitting: *Deleted

## 2017-01-18 ENCOUNTER — Other Ambulatory Visit: Payer: Self-pay | Admitting: *Deleted

## 2017-01-18 DIAGNOSIS — G4733 Obstructive sleep apnea (adult) (pediatric): Secondary | ICD-10-CM

## 2017-01-18 NOTE — Progress Notes (Signed)
Patient referred to [facility] for BIPAP set up .staff message sent to  [place] her the order has ben placed into EPIC.

## 2017-01-18 NOTE — Telephone Encounter (Signed)
Pt approved for eliquis patient assistance from 01/16/17--01/16/18. Application [id number]

## 2017-01-18 NOTE — Telephone Encounter (Signed)
Left message to return a call to me to give DME information.

## 2017-02-07 ENCOUNTER — Encounter (HOSPITAL_COMMUNITY): Payer: Self-pay | Admitting: *Deleted

## 2017-02-10 ENCOUNTER — Telehealth: Payer: Self-pay | Admitting: Cardiovascular Disease

## 2017-02-10 NOTE — Telephone Encounter (Signed)
New message      Calling to see if her pt assistance medication (eliquis) was sent to our office.  Please call

## 2017-03-23 ENCOUNTER — Telehealth: Payer: Self-pay | Admitting: Cardiovascular Disease

## 2017-03-23 ENCOUNTER — Telehealth: Payer: Self-pay | Admitting: *Deleted

## 2017-03-23 NOTE — Telephone Encounter (Signed)
Follow Up   Pt returning phone call from nurse. Requesting call back

## 2017-03-23 NOTE — Telephone Encounter (Signed)
Returned a call to patient informing her that I have been trying to work to get her a CPAP machine.(patient has no insurance) informed her that there is a CPAP assistance program available however they do not provide all of the  Supplies nor a humidifier with the machine. They do not guarantee the patient masks. Supplies are based on their inventory and are limited.There is a $100 charge for the machine. The equipment package comes "AS IS." and without warranty. The patient informs me that as far as she is aware she still does not have insurance. She says however, that her husband has " checked into some" she has declined the CPAP Assistance Program offer. She doesn't feel that she can use the machine without having a humidifier due to her already having "dry sinuses." I told her to call me back if and when she gets some insurance. I will also check to see if any of the DME companies would be willing to set up a payment plan. If so, I will let her know. Patient agrees with this plan.

## 2017-03-23 NOTE — Telephone Encounter (Signed)
Left message to return a call. I need to further discuss her sleep apnea treatment.

## 2017-04-11 ENCOUNTER — Ambulatory Visit (HOSPITAL_COMMUNITY): Payer: No Typology Code available for payment source | Admitting: Nurse Practitioner

## 2017-04-17 ENCOUNTER — Telehealth: Payer: Self-pay | Admitting: Cardiovascular Disease

## 2017-04-17 NOTE — Telephone Encounter (Signed)
Patient called requesting a note stating that she has been diagnosed with sleep apnea and due to financial reasons she cannot afford her CPAP machine. because of her not being treated this may cause her to oversleep in the mornings resulting in her being late for class. Also due to her fatigue she may not be as attentive or maybe dose off during class.this will be deferred to Dr Claiborne Billings for review.

## 2017-04-17 NOTE — Telephone Encounter (Signed)
New message      Talk to the nurse regarding her CPAP machine

## 2017-04-18 ENCOUNTER — Telehealth: Payer: Self-pay | Admitting: Cardiovascular Disease

## 2017-04-18 ENCOUNTER — Other Ambulatory Visit (HOSPITAL_COMMUNITY): Payer: Self-pay | Admitting: *Deleted

## 2017-04-18 MED ORDER — APIXABAN 5 MG PO TABS: 5.0000 mg | ORAL_TABLET | Freq: Two times a day (BID) | ORAL | 3 refills | Status: DC

## 2017-04-18 NOTE — Telephone Encounter (Signed)
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

## 2017-04-18 NOTE — Telephone Encounter (Signed)
Returned call. Pt aware this request was sent to Dr. Claiborne Billings yesterday and will be reviewed - we will call once letter ready. She voiced understanding and thanks.

## 2017-04-25 ENCOUNTER — Ambulatory Visit: Payer: No Typology Code available for payment source | Admitting: Cardiovascular Disease

## 2017-04-27 ENCOUNTER — Encounter (HOSPITAL_COMMUNITY): Payer: Self-pay | Admitting: Nurse Practitioner

## 2017-04-27 ENCOUNTER — Ambulatory Visit (HOSPITAL_COMMUNITY)
Admission: RE | Admit: 2017-04-27 | Discharge: 2017-04-27 | Disposition: A | Discharge status: Home / Self Care | Payer: No Typology Code available for payment source | Source: Ambulatory Visit | Attending: Nurse Practitioner | Admitting: Nurse Practitioner

## 2017-04-27 VITALS — BP 126/74 | HR 83 | Ht 65.0 in | Wt 302.8 lb

## 2017-04-27 DIAGNOSIS — Z87891 Personal history of nicotine dependence: Secondary | ICD-10-CM | POA: Insufficient documentation

## 2017-04-27 DIAGNOSIS — R0683 Snoring: Secondary | ICD-10-CM | POA: Insufficient documentation

## 2017-04-27 DIAGNOSIS — E0590 Thyrotoxicosis, unspecified without thyrotoxic crisis or storm: Secondary | ICD-10-CM | POA: Insufficient documentation

## 2017-04-27 DIAGNOSIS — I480 Paroxysmal atrial fibrillation: Secondary | ICD-10-CM | POA: Insufficient documentation

## 2017-04-27 DIAGNOSIS — I10 Essential (primary) hypertension: Secondary | ICD-10-CM | POA: Insufficient documentation

## 2017-04-27 DIAGNOSIS — E119 Type 2 diabetes mellitus without complications: Secondary | ICD-10-CM | POA: Insufficient documentation

## 2017-04-27 DIAGNOSIS — Z7984 Long term (current) use of oral hypoglycemic drugs: Secondary | ICD-10-CM | POA: Insufficient documentation

## 2017-04-27 NOTE — Progress Notes (Signed)
Primary Care Physician: Haley Perna, NP Referring Physician: Ignacia Bayley, NP Sleep MD: Dr. Dessie Coma [name] is a 51 y.o. female with a h/o of obesity, HTN, DM that is in the atrial fibrillation clinic for further evaluation of atrial fibrillation.. Has a remote h/o of atrial fibrillation at time of treatment of hyperthyroidism.  Was seen in ER with sudden onset of afib with RVR, 9/30, and had f/u with Haley Bayley, NP, 10/11/16. At that time she had not been taking lopressor or eliquis and he encouraged her to restart. She stated that she was unable to afford eliquis. She was given samples and PharmD assisted her that day to fill out forms for financial assistance to obtain drug. A sleep study was ordered and pt found to have severe sleep apnea. Has not been able to start due to not being able to afford. She is in college and performed poorly on end of semester tests. She feels untreated sleep apnea contributed. CHA2DS2VASc score is at least 3.  Today, she denies symptoms of palpitations, chest pain, shortness of breath, orthopnea, PND, lower extremity edema, dizziness, presyncope, syncope, or neurologic sequela. The patient is tolerating medications without difficulties and is otherwise without complaint today.   Past Medical History:  Diagnosis Date  . Essential hypertension   . Fibroids   . Hyperthyroidism    a. 2010, treated w/ tapazole.  . Morbid obesity ([place])   . Paroxysmal atrial fibrillation ([place])    a. 03/2009 - in setting of hyperthyroidism and caffeine intake, converted spontaneously;  b. 08/2016 ED visit for recurrent PAF-->successful DCCV in ED.  [name] Snores   . Type II diabetes mellitus ([place])    Past Surgical History:  Procedure Laterality Date  . UTERINE FIBROID SURGERY      Current Outpatient Prescriptions  Medication Sig Dispense Refill  . apixaban (ELIQUIS) 5 MG TABS tablet Take 1 tablet (5 mg total) by mouth 2 (two) times daily. 180 tablet 3  .  cyclobenzaprine (FLEXERIL) 10 MG tablet Take 10 mg by mouth at bedtime as needed for muscle spasms.     [name] glipiZIDE (GLUCOTROL) 10 MG tablet Take 10 mg by mouth daily before breakfast.    . hydrochlorothiazide (HYDRODIURIL) 25 MG tablet Take 25 mg by mouth daily.    . metFORMIN (GLUCOPHAGE) 1000 MG tablet Take 1,000 mg by mouth 2 (two) times daily with a meal.    . metoprolol tartrate (LOPRESSOR) 25 MG tablet Take 1 tablet (25 mg total) by mouth daily. 30 tablet 6  . naproxen (NAPROSYN) 500 MG tablet Take 500 mg by mouth every morning.     . Multiple Vitamins-Minerals (MULTIVITAMIN ADULTS) TABS Take 1 tablet by mouth daily.     No current facility-administered medications for this encounter.     No Known Allergies  Social History   Social History  . Marital status: Married    Spouse name: N/A  . Number of children: N/A  . 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 Smoking status: Former Smoker    Packs/day: 1.00    Years: 32.00    Quit date: 2012  . Smokeless tobacco: Never Used     Comment: patient vapes  . Alcohol use No     Comment: occasional drink.  . Drug use: No  . Sexual activity: Not on file   Other Topics Concern  . 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  Does not routinely exercise.    No family history on file.  ROS- All systems are reviewed and negative except as per the HPI above  Physical Exam: Vitals:   04/27/17 1137  BP: 126/74  Pulse: 83  Weight: (!) 302 lb 12.8 oz (137.3 kg)  Height: 5\' 5"  (1.651 m)   Wt Readings from Last 3 Encounters:  04/27/17 (!) 302 lb 12.8 oz (137.3 kg)  01/10/17 (!) 303 lb (137.4 kg)  01/04/17 (!) 314 lb (142.4 kg)    Labs: Lab Results  Component Value Date   NA 140 08/29/2016   K 4.1 08/29/2016   CL 103 08/29/2016   CO2 29 08/29/2016   GLUCOSE 137 (H)  08/29/2016   BUN 10 08/29/2016   CREATININE 0.51 08/29/2016   CALCIUM 9.3 08/29/2016   MG 1.8 08/29/2016   Lab Results  Component Value Date   INR 1.0 03/15/2009   Lab Results  Component Value Date   CHOL  03/15/2009    123        ATP III CLASSIFICATION:  <200     mg/dL   Desirable  200-239  mg/dL   Borderline High  >=240    mg/dL   High          HDL 40 03/15/2009   LDLCALC  03/15/2009    66        Total Cholesterol/HDL:CHD Risk Coronary Heart Disease Risk Table                     Men   Women  1/2 Average Risk   3.4   3.3  Average Risk       5.0   4.4  2 X Average Risk   9.6   7.1  3 X Average Risk  23.4   11.0        Use the calculated Patient Ratio above and the CHD Risk Table to determine the patient's CHD Risk.        ATP III CLASSIFICATION (LDL):  <100     mg/dL   Optimal  100-129  mg/dL   Near or Above                    Optimal  130-159  mg/dL   Borderline  160-189  mg/dL   High  >190     mg/dL   Very High   TRIG 85 03/15/2009     GEN- The patient is well appearing, alert and oriented x 3 today.   Head- normocephalic, atraumatic Eyes-  Sclera clear, conjunctiva pink Ears- hearing intact Oropharynx- clear Neck- supple, no JVP Lymph- no cervical lymphadenopathy Lungs- Clear to ausculation bilaterally, normal work of breathing Heart- Regular rate and rhythm, no murmurs, rubs or gallops, PMI not laterally displaced GI- soft, NT, ND, + BS Extremities- no clubbing, cyanosis, or edema MS- no significant deformity or atrophy Skin- no rash or lesion Psych- euthymic mood, full affect Neuro- strength and sensation are intact  EKG- NSR at 83 bpm, pr int 142 ms, qrs int 82 ms, qtc 437 ms Epic records reviewed Echo-Study Conclusions  - Left ventricle: The cavity size was normal. Wall thickness was   increased in a pattern of mild LVH. There was mild focal basal   hypertrophy of the septum. Systolic function was normal. The   estimated  ejection fraction  was in the range of 60% to 65%.   Doppler parameters are consistent with abnormal left ventricular   relaxation (grade 1 diastolic dysfunction). - Left atrium: The atrium was mildly dilated.    Assessment and Plan: 1. Paroxsymal afib No afib per pt General education of afib discussed focusing on lifestyle In contact with Dr. Evette Georges office re sleep study results Continue lopressor and eliquis, no bleeding issues Exercise program and weight loss encouraged  F/u per pcp Afib clinic as needed   Butch Penny C. Hektor Huston, [facility] [address] [place], [place] [address] [phone]

## 2017-04-27 NOTE — Telephone Encounter (Signed)
°  Follow Up  Pt I calling following up on requested letter. Pt did not wish to leave details about letter but is requesting a call back and states she needs the letter by 05/05/17.

## 2017-05-03 NOTE — Telephone Encounter (Signed)
Returned call to patient.Stated she needed a letter from [facility] stating she cannot focus in class due to severe sleep apnea.Advised I will have Dr.Kelly sign letter and will leave it at front desk for pick up.

## 2017-05-03 NOTE — Telephone Encounter (Signed)
F/U Call: Patient states that she will need the letter for school before 05-05-17. Please call, thanks.

## 2017-05-08 NOTE — Telephone Encounter (Signed)
Letter was signed

## 2017-05-22 ENCOUNTER — Telehealth: Payer: Self-pay | Admitting: Cardiovascular Disease

## 2017-05-22 NOTE — Telephone Encounter (Signed)
Returned the call back to the patient. She stated that she needed her medical records. She has been advised to come to the office and fill out a medical release form. She also stated that she received a free CPAP from a friend. She would like to know if she can have it titrated. Will discuss with her provider and call her back.

## 2017-05-22 NOTE — Telephone Encounter (Signed)
New message    Pt verbalized that she is calling to see what documents do she need to apply for disability   She wants to know if she needs to bring the CPAP machine in to be adjusted

## 2017-05-23 NOTE — Telephone Encounter (Signed)
Called the patient back to verify the type of CPAP that the patient was given. She stated that she was not home but would call back when she returned home.

## 2017-05-24 NOTE — Telephone Encounter (Signed)
Returned call to patient. She states she has a machine that says [facility] w/[facility] sticker -- [place] -- 1.25A FG -- 3.75A SYS -- [id number]  Bar Code: [id number] Ref: [id number] LOT: [id number]  [facility]: [phone]  Staff message sent to Darlina Guys w/[facility] and Mariann Laster, CMA cc'ed on staff message

## 2017-05-24 NOTE — Telephone Encounter (Signed)
Left message for pt to call.

## 2017-05-24 NOTE — Telephone Encounter (Signed)
New Message ° ° pt verbalized that she is returning call for rn °

## 2017-05-25 NOTE — Telephone Encounter (Signed)
Donia Ast M, RN        We called [employer] to get the information on the machine. They said that it is an S9 Resmed Auto CPAP. This type of machine will not host bipap settings. Hope this helps.    ** above message from [place]

## 2017-06-02 NOTE — Telephone Encounter (Signed)
°  New Prob   Pt states she received her CPAP machine and would like to speak to someone about how to get it serviced and how to use it. Please call.

## 2017-06-02 NOTE — Telephone Encounter (Signed)
Discussed with Erasmo Score CMA with Dr Claiborne Billings and she will follow up with patient. Advised patient

## 2017-06-05 NOTE — Telephone Encounter (Signed)
Also need to have DME company educate patient about use and maintenance.

## 2017-06-06 NOTE — Telephone Encounter (Signed)
Spoke with patient informing her that Dr Evette Georges order is for a BIPAP machine and apparently she was given a CPAP machine by her friend. Patient informed these are 2 different types of machines. Patient would like for me to ask Dr Claiborne Billings if she can try to use this CPAP machine. This she feels is better than nothing at all. Will notify Dr Claiborne Billings for recomendations

## 2017-06-06 NOTE — Telephone Encounter (Signed)
Spoke with patient in reference to her BIPAP therapy. Will route to Dr Claiborne Billings for recommendation.

## 2017-06-13 ENCOUNTER — Telehealth: Payer: Self-pay | Admitting: Cardiovascular Disease

## 2017-06-13 NOTE — Telephone Encounter (Signed)
Please call,concerning her C Pap machine.

## 2017-06-15 ENCOUNTER — Telehealth: Payer: Self-pay | Admitting: *Deleted

## 2017-06-15 NOTE — Telephone Encounter (Signed)
Left message to return a call to discuss her CPAP machine.

## 2017-06-15 NOTE — Telephone Encounter (Signed)
Appointment made for patient to see Dr Claiborne Billings in tomorrow's sleep clinic. She was advised to bring the machine with her.

## 2017-06-15 NOTE — Telephone Encounter (Signed)
Left a message to return a call to discuss Dr Evette Georges recommendations.

## 2017-06-15 NOTE — Telephone Encounter (Signed)
Is the CPAP machine downloadable?  We would need to assess efficacy of treatment and to make certain she is not having any central apneic spells with CPAP.  But okay to try.

## 2017-06-16 ENCOUNTER — Ambulatory Visit (INDEPENDENT_AMBULATORY_CARE_PROVIDER_SITE_OTHER): Payer: Self-pay | Admitting: Cardiovascular Disease

## 2017-06-16 VITALS — BP 138/64 | HR 82 | Ht 67.0 in | Wt 302.4 lb

## 2017-06-16 DIAGNOSIS — G4733 Obstructive sleep apnea (adult) (pediatric): Secondary | ICD-10-CM

## 2017-06-16 DIAGNOSIS — I10 Essential (primary) hypertension: Secondary | ICD-10-CM

## 2017-06-16 DIAGNOSIS — E119 Type 2 diabetes mellitus without complications: Secondary | ICD-10-CM

## 2017-06-16 DIAGNOSIS — I480 Paroxysmal atrial fibrillation: Secondary | ICD-10-CM

## 2017-06-16 DIAGNOSIS — Z7901 Long term (current) use of anticoagulants: Secondary | ICD-10-CM

## 2017-06-16 NOTE — Patient Instructions (Signed)
Your physician recommends that you schedule a follow-up appointment in: 3-4 months with Dr Claiborne Billings for sleep.

## 2017-06-18 NOTE — Progress Notes (Signed)
Cardiology Office Note    Date:  06/18/2017   ID:  Haley [name], DOB February 01, 1966, MRN [id number]  PCP:  Kerin Perna, NP  Cardiologist:  [name], MD (sleep)  No chief complaint on file.   History of Present Illness:  Haley [name] is a 51 y.o. female who presents for initial sleep clinic evaluation.  Haley [name] has a history of PAF, which initially occurred in the setting of hyperthyroidism in 2010.  She recently developed recurrent paroxysmal atrial fibrillation and underwent cardioversion in the ED in September 2017.  She has a history of type 2 diabetes mellitus, morbid obesity, hypertension, and she had recently seen [name] in November 2017.  At that time, she was started on beta blocker therapy.  She was started on eliquis anticoagulation.  Due to concerns for obstructive sleep apnea.  She was referred for a sleep study which was done on 12/13/2016.  She had severe sleep apnea with an AHI of 111.2 per hour, an RDI of 113.  AHI during rem sleep was 74.2.  She had severe oxygen desaturation to 51%.  She underwent a CPAP, and ultimate BiPAP titration 21/31/2018.  2.  To continue to advance had a CPAP Pressure of 19, BiPAP was implemented and she required maximum titration up to 25/21.  At 23/19, AHI was still elevated at 16.4, but 25/21, AHI was 0.  Apparently, the patient had concerns about cost since she currently does not have insurance.  She was wondering about using a CPAP from a family member since he no longer uses treatment.  I was concerned that CPAP may not be as beneficial.  She brought the machine with her to the office today for our review and assessment.  She was given a ResMed S9 lead.  She's not yet used this.  She also has had a Simplex full face mask, given to her the time of first titration study.  Presently, she goes to bed around 1 AM and wakes up for good around noon.  She has frequent awakenings during the night.  She continues to  have daytime sleepiness.  Epworth Sleepiness Scale: Situation   Chance of Dozing/Sleeping (0 = never , 1 = slight chance , 2 = moderate chance , 3 = high chance )   sitting and reading 2   watching TV 2   sitting inactive in a public place 2   being a passenger in a motor vehicle for an hour or more 1   lying down in the afternoon 2   sitting and talking to someone 1   sitting quietly after lunch (no alcohol) 2   while stopped for a few minutes in traffic as the driver 2   Total Score  14   Prior to initiating therapy with the CPAP unit rather than the recommended BiPAP unit.  She presents for evaluation.  Past Medical History:  Diagnosis Date  . Essential hypertension   . Fibroids   . Hyperthyroidism    a. 2010, treated w/ tapazole.  . Morbid obesity ([place])   . Paroxysmal atrial fibrillation ([place])    a. 03/2009 - in setting of hyperthyroidism and caffeine intake, converted spontaneously;  b. 08/2016 ED visit for recurrent PAF-->successful DCCV in ED.  [name] Snores   . Type II diabetes mellitus ([place])     Past Surgical History:  Procedure Laterality Date  . UTERINE FIBROID SURGERY      Current Medications: Outpatient Medications Prior to Visit  Medication Sig Dispense Refill  . apixaban (ELIQUIS) 5 MG TABS tablet Take 1 tablet (5 mg total) by mouth 2 (two) times daily. 180 tablet 3  . cyclobenzaprine (FLEXERIL) 10 MG tablet Take 10 mg by mouth at bedtime as needed for muscle spasms.     [name] glipiZIDE (GLUCOTROL) 10 MG tablet Take 10 mg by mouth daily before breakfast.    . hydrochlorothiazide (HYDRODIURIL) 25 MG tablet Take 25 mg by mouth daily.    . metFORMIN (GLUCOPHAGE) 1000 MG tablet Take 1,000 mg by mouth 2 (two) times daily with a meal.    . metoprolol tartrate (LOPRESSOR) 25 MG tablet Take 1 tablet (25 mg total) by mouth daily. 30 tablet 6  . Multiple Vitamins-Minerals (MULTIVITAMIN ADULTS) TABS Take 1 tablet by mouth daily.    . naproxen (NAPROSYN) 500 MG tablet Take 500 mg  by mouth every morning.      No facility-administered medications prior to visit.      Allergies:   Patient has no known allergies.   Social History   Social History  . Marital status: Married    Spouse name: N/A  . Number of children: N/A  . 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 Smoking status: Former Smoker    Packs/day: 1.00    Years: 32.00    Quit date: 2012  . Smokeless tobacco: Never Used     Comment: patient vapes  . Alcohol use No     Comment: occasional drink.  . Drug use: No  . Sexual activity: Not on file   Other Topics Concern  . Not on file   Social History Narrative   Lives in [place] with husband.  Systems analyst.  Also in school @ [employer] for [employer].  Does not routinely exercise.     Family History:  The patient's family history is not on file.  Family history is notable in that her uncle  had sleep apnea but has not utilized treatment and he gave her his machine.  ROS General: Negative; No fevers, chills, or night sweats;  HEENT: Negative; No changes in vision or hearing, sinus congestion, difficulty swallowing Pulmonary: Negative; No cough, wheezing, shortness of breath, hemoptysis Cardiovascular: History of PAF GI: Negative; No nausea, vomiting, diarrhea, or abdominal pain GU: Negative; No dysuria, hematuria, or difficulty voiding Musculoskeletal: Negative; no myalgias, joint pain, or weakness Hematologic/Oncology: Negative; no easy bruising, bleeding Endocrine: Negative; no heat/cold intolerance; no diabetes Neuro: Negative; no changes in balance, headaches Skin: Negative; No rashes or skin lesions Psychiatric: Negative; No behavioral problems, depression Sleep: Positive for snoring, daytime sleepiness, hypersomnolence;  Nobruxism, restless legs, hypnogognic hallucinations, no cataplexy Other comprehensive 14 point system review is negative.   PHYSICAL EXAM:   VS:  BP  138/64   Pulse 82   Ht '5\' 7"'$  (1.702 m)   Wt (!) 302 lb 6.4 oz (137.2 kg)   BMI 47.36 kg/m      Wt Readings from Last 3 Encounters:  06/16/17 (!) 302 lb 6.4 oz (137.2 kg)  04/27/17 (!) 302 lb 12.8 oz (137.3 kg)  01/10/17 (!) 303 lb (137.4 kg)    General: Alert, oriented, no distress.  Skin: normal turgor, no rashes, warm and dry HEENT: Normocephalic, atraumatic. Pupils equal round and reactive to light; sclera anicteric; extraocular muscles intact; Fundi No hemorrhages or exudates Nose without nasal septal hypertrophy Mouth/Parynx benign; Mallinpatti scale 4 Neck: No JVD, no carotid  bruits; normal carotid upstroke Lungs: clear to ausculatation and percussion; no wheezing or rales Chest wall: without tenderness to palpitation Heart: PMI not displaced, RRR, s1 s2 normal, 1/6 systolic murmur, no diastolic murmur, no rubs, gallops, thrills, or heaves Abdomen: soft, nontender; no hepatosplenomehaly, BS+; abdominal aorta nontender and not dilated by palpation. Back: no CVA tenderness Pulses 2+ Musculoskeletal: full range of motion, normal strength, no joint deformities Extremities: no clubbing cyanosis or edema, Homan's sign negative  Neurologic: grossly nonfocal; Cranial nerves grossly wnl Psychologic: Normal mood and affect   Studies/Labs Reviewed:   EKG:  EKG is ordered today.  ECG (independently read by me): Normal sinus rhythm at 73 bpm.  No ectopy.  Normal intervals.  Recent Labs: BMP Latest Ref Rng & Units 08/29/2016 05/04/2009 03/15/2009  Glucose 65 - 99 mg/dL 137(H) 94 119(H)  BUN 6 - 20 mg/dL '10 13 12  '$ Creatinine 0.44 - 1.00 mg/dL 0.51 0.61 0.56  Sodium 135 - 145 mmol/L 140 139 140  Potassium 3.5 - 5.1 mmol/L 4.1 4.5 3.3(L)  Chloride 101 - 111 mmol/L 103 109 105  CO2 22 - 32 mmol/L '29 27 24  '$ Calcium 8.9 - 10.3 mg/dL 9.3 9.4 9.1     Hepatic Function Latest Ref Rng & Units 05/04/2009  Total Protein 6.0 - 8.3 g/dL 7.5  Albumin 3.5 - 5.2 g/dL 4.1  AST 0 - 37 U/L 38(H)    ALT 0 - 35 U/L 19  Alk Phosphatase 39 - 117 U/L 45  Total Bilirubin 0.3 - 1.2 mg/dL 0.7    CBC Latest Ref Rng & Units 08/29/2016 05/04/2009 03/14/2009  WBC 4.0 - 10.5 K/uL 8.5 9.9 -  Hemoglobin 12.0 - 15.0 g/dL 14.6 10.7(L) 14.6  Hematocrit 36.0 - 46.0 % 47.2(H) 35.1(L) 43.0  Platelets 150 - 400 K/uL 282 279 -   Lab Results  Component Value Date   MCV 96.5 08/29/2016   MCV 81.4 05/04/2009   MCV 84.5 03/14/2009   Lab Results  Component Value Date   TSH 1.47 10/11/2016   No results found for: HGBA1C   BNP No results found for: BNP  ProBNP No results found for: PROBNP   Lipid Panel     Component Value Date/Time   CHOL  03/15/2009 0517    123        ATP III CLASSIFICATION:  <200     mg/dL   Desirable  200-239  mg/dL   Borderline High  >=240    mg/dL   High          TRIG 85 03/15/2009 0517   HDL 40 03/15/2009 0517   CHOLHDL 3.1 03/15/2009 0517   VLDL 17 03/15/2009 0517   LDLCALC  03/15/2009 0517    66        Total Cholesterol/HDL:CHD Risk Coronary Heart Disease Risk Table                     Men   Women  1/2 Average Risk   3.4   3.3  Average Risk       5.0   4.4  2 X Average Risk   9.6   7.1  3 X Average Risk  23.4   11.0        Use the calculated Patient Ratio above and the CHD Risk Table to determine the patient's CHD Risk.        ATP III CLASSIFICATION (LDL):  <100     mg/dL   Optimal  100-129  mg/dL   Near or Above                    Optimal  130-159  mg/dL   Borderline  160-189  mg/dL   High  >190     mg/dL   Very High     RADIOLOGY: No results found.   Additional studies/ records that were reviewed today include:  I have reviewed the patient's prior ER evaluation for cardioversion, A. fib clinic note, and evaluation by Ignacia Bayley, NP    ASSESSMENT:    1. Severe obstructive sleep apnea   2. Paroxysmal atrial fibrillation [facility])      PLAN:  Haley [name] is a 51 year old female who is morbidly obese with a BMI of 47.36, and  has a history of hypertension, hyperthyroidism treated with His all, paroxysmal atrial fibrillation, type 2 diabetes mellitus, and was diagnosed as having very severe sleep apnea.  She failed CPAP therapy and required maximum BiPAP therapy at 25/21 for optimal treatment.  Apparently, he is not instituted therapy because she at present does not have insurance and was unable to purchase a new machine.  She was recently offered a fairly unused ResMed model, which isn't the most recent but is an S9 lead.  I again reviewed her CPAP and BiPAP titrations.  She had been titrated up to 19 cm of CPAP therapy before she was switched to BiPAP.  Although this may not be optimal treatment, I have suggested that she at least initiate treatment.  We will need to program her machine, which would be a maximum up to 20 cm water pressure.  She currently has the mask from her titration study.  I reviewed with her at length the adverse consequences of untreated sleep apnea with reference to her cardiovascular health.  Her EKG today confirms that she is maintaining sinus rhythm.  She is anticoagulated on eloquence and is without bleeding.  Her blood pressure today is controlled.  We discussed the importance of weight loss and exercise.  I will see her in follow-up in 3-4 months for reevaluation

## 2017-06-29 ENCOUNTER — Telehealth: Payer: Self-pay | Admitting: Cardiovascular Disease

## 2017-06-29 NOTE — Telephone Encounter (Signed)
Patient requesting orders for supplies sent to [facility] faxed.

## 2017-06-29 NOTE — Telephone Encounter (Signed)
New message    Pt is calling stating that she contacted [facility] and they said she would need a new prescription for parts. She said she needs a filter for the back, and she may need a new mask and hose. Please call.

## 2017-09-01 ENCOUNTER — Telehealth: Payer: Self-pay | Admitting: Cardiovascular Disease

## 2017-09-01 NOTE — Telephone Encounter (Signed)
Returned the call to the patient. She stated that [place] had mentioned to her about a program that provides assistance for sleep apnea patients and their equipment but she would need a note from the provider. She would like to know if Dr.Kelly has heard of this- [employer] Sleep Apnea assistance program (fax [phone]). Message routed to the provider and his nurse for their knowledge.

## 2017-09-01 NOTE — Telephone Encounter (Signed)
New message    Pt is calling asking for a call back about her cpap machine.

## 2017-09-05 NOTE — Telephone Encounter (Signed)
I am not aware of this but if they can improve her likelihood of assistance that would be great

## 2017-09-08 NOTE — Telephone Encounter (Signed)
Left message to call back to see what letter needs to consist of for the assistance program.

## 2017-09-11 NOTE — Telephone Encounter (Signed)
Mrs. Haley [name]- Haley [name] is returning a call. Thanks

## 2017-09-11 NOTE — Telephone Encounter (Signed)
Returned call to pt she states that she "needs a rx to exchange her CPAP for BIPAP machine". Per [facility]. Informed pt I will send message to Dr Evette Georges nurse for this. Looks like on 01-04-2017 telephone message was sent already: Patient referred to [place] for BIPAP set up .staff message sent to  [place] her the order has ben placed into [facility].  Can you check on this and see what she has to do for sure? Please advise

## 2017-09-11 NOTE — Telephone Encounter (Signed)
Follow up   Pt is returning call    

## 2017-09-12 NOTE — Telephone Encounter (Signed)
Left message to call back  

## 2017-09-12 NOTE — Telephone Encounter (Signed)
Pease call,said you had talked to her yesterday.

## 2017-09-12 NOTE — Telephone Encounter (Signed)
Returned call to patient.  Patient reports she is trying to get Bipap machine through [facility] patient assistance program and [facility] instructed her she would need a rx for Bipap.   Per last OV note with Dr. Claiborne Billings:  She failed CPAP therapy and required maximum BiPAP therapy at 25/21 for optimal treatment.  Apparently, he is not instituted therapy because she at present does not have insurance and was unable to purchase a new machine.  She was recently offered a fairly unused ResMed model, which isn't the most recent but is an S9 lead.  I again reviewed her CPAP and BiPAP titrations.  She had been titrated up to 19 cm of CPAP therapy before she was switched to BiPAP.  Although this may not be optimal treatment, I have suggested that she at least initiate treatment.  We will need to program her machine, which would be a maximum up to 20 cm water pressure.  She currently has the mask from her titration study.    Will get pressure settings from Dr. Claiborne Billings and fax orders to [facility] to see if possible to get patient assistance and get set up for preferred therapy (Bipap).

## 2017-09-15 NOTE — Telephone Encounter (Signed)
Bipap Orders faxed to [facility]  Left message for patient to call back to make aware.

## 2017-09-19 NOTE — Telephone Encounter (Signed)
Spoke to patient, patient aware orders were faxed to [facility].

## 2017-10-02 ENCOUNTER — Telehealth: Payer: Self-pay | Admitting: Cardiovascular Disease

## 2017-10-02 ENCOUNTER — Encounter: Payer: Self-pay | Admitting: Cardiovascular Disease

## 2017-10-02 ENCOUNTER — Ambulatory Visit (INDEPENDENT_AMBULATORY_CARE_PROVIDER_SITE_OTHER): Payer: Self-pay | Admitting: Cardiovascular Disease

## 2017-10-02 VITALS — BP 139/90 | HR 81 | Ht 67.0 in | Wt 303.6 lb

## 2017-10-02 DIAGNOSIS — G4733 Obstructive sleep apnea (adult) (pediatric): Secondary | ICD-10-CM

## 2017-10-02 DIAGNOSIS — I10 Essential (primary) hypertension: Secondary | ICD-10-CM

## 2017-10-02 DIAGNOSIS — I480 Paroxysmal atrial fibrillation: Secondary | ICD-10-CM

## 2017-10-02 DIAGNOSIS — E119 Type 2 diabetes mellitus without complications: Secondary | ICD-10-CM

## 2017-10-02 NOTE — Telephone Encounter (Signed)
Spoke to patient, patient is going to [facility] to get download (seen in clinic this AM by Dr. Claiborne Billings).

## 2017-10-02 NOTE — Patient Instructions (Signed)
Follow-Up: Your physician recommends that you schedule a follow-up appointment: Please call when you are set up for Bipap to schedule follow up with Dr. Claiborne Billings.    Any Other Special Instructions Will Be Listed Below (If Applicable).  Please bring your machine card by office to obtain a download for Dr. Claiborne Billings to read.    If you need a refill on your cardiac medications before your next appointment, please call your pharmacy.

## 2017-10-02 NOTE — Progress Notes (Signed)
Cardiology Office Note    Date:  10/02/2017   ID:  Haley [name], DOB 1966/07/31, MRN [id number]  PCP:  Kerin Perna, NP  Cardiologist:  [name], MD (sleep)  No chief complaint on file.   History of Present Illness:  Haley [name] is a 51 y.o. female who presents for a follow-up sleep clinic evaluation.  Ms. Majesti [name] has a history of PAF, which initially occurred in the setting of hyperthyroidism in 2010.  She recently developed recurrent paroxysmal atrial fibrillation and underwent cardioversion in the ED in September 2017.  She has a history of type 2 diabetes mellitus, morbid obesity, hypertension, and she had recently seen [name] in November 2017.  At that time, she was started on beta blocker therapy.  She was started on eliquis anticoagulation.  Due to concerns for obstructive sleep apnea.  She was referred for a sleep study which was done on 12/13/2016.  She had severe sleep apnea with an AHI of 111.2 per hour, an RDI of 113.  AHI during rem sleep was 74.2.  She had severe oxygen desaturation to 51%.  She underwent a CPAP, and ultimate BiPAP titration on 01/04/2017.  On her titration study CPAP was advanced to 19 cm water pressure but due to continued events, BiPAP was implemented and she required maximum titration up to 25/21.  At 23/19, AHI was still elevated 6.4, but 25/21 at AHI was 0.  Apparently, the patient had concerns about cost since she currently does not have insurance.  She was wondering about using a CPAP from a family member since he no longer uses treatment.  I was concerned that CPAP may not be as beneficial.  When I saw her in July 2018, she  brought the machine with her to the office today for our review and assessment.  She was given a ResMed S9 and had a  full face mas given to her the time of first titration study.  Presently, she goes to bed around 1 AM and wakes up for good around noon.  She had frequent awakenings during the  night and  daytime sleepiness.  Epworth Sleepiness Scale: Situation   Chance of Dozing/Sleeping (0 = never , 1 = slight chance , 2 = moderate chance , 3 = high chance )   sitting and reading 2   watching TV 2   sitting inactive in a public place 2   being a passenger in a motor vehicle for an hour or more 1   lying down in the afternoon 2   sitting and talking to someone 1   sitting quietly after lunch (no alcohol) 2   while stopped for a few minutes in traffic as the driver 2   Total Score  14   When I saw her, I recommended that her CPAP unit  adjusted to it 20 cm maximum pressure.  We had set up the process through assisted support for her to try to get a BiPAP machine.  This is still in process.  She did [facility] since this was accompanied that had supplied her friend with the CPAP unit that she is receiving.  She has felt improved since using CPAP therapy.  However, she still admits to fatigue.  She feels at times that in the middle of night.  She was not getting enough air and for this reason , was taking her mask off when she felt the sensation. [name]  She  presents to the office today for evaluation but has not yet received her BiPAP unit.  Unfortunate, she did not bring her CPAP unit with her to the office today.  Past Medical History:  Diagnosis Date  . Essential hypertension   . Fibroids   . Hyperthyroidism    a. 2010, treated w/ tapazole.  . Morbid obesity ([place])   . Paroxysmal atrial fibrillation ([place])    a. 03/2009 - in setting of hyperthyroidism and caffeine intake, converted spontaneously;  b. 08/2016 ED visit for recurrent PAF-->successful DCCV in ED.  [name] Snores   . Type II diabetes mellitus ([place])     Past Surgical History:  Procedure Laterality Date  . UTERINE FIBROID SURGERY      Current Medications: Outpatient Medications Prior to Visit  Medication Sig Dispense Refill  . apixaban (ELIQUIS) 5 MG TABS tablet Take 1 tablet (5 mg total) by mouth 2  (two) times daily. 180 tablet 3  . cyclobenzaprine (FLEXERIL) 10 MG tablet Take 10 mg by mouth at bedtime as needed for muscle spasms.     [name] glipiZIDE (GLUCOTROL) 10 MG tablet Take 10 mg by mouth daily before breakfast.    . metFORMIN (GLUCOPHAGE) 1000 MG tablet Take 1,000 mg by mouth 2 (two) times daily with a meal.    . metoprolol tartrate (LOPRESSOR) 25 MG tablet Take 1 tablet (25 mg total) by mouth daily. 30 tablet 6  . Multiple Vitamins-Minerals (MULTIVITAMIN ADULTS) TABS Take 1 tablet by mouth daily.    . naproxen (NAPROSYN) 500 MG tablet Take 500 mg by mouth every morning.     . hydrochlorothiazide (HYDRODIURIL) 25 MG tablet Take 25 mg by mouth daily.     No facility-administered medications prior to visit.      Allergies:   Patient has no known allergies.   Social History   Social History  . Marital status: Married    Spouse name: N/A  . Number of children: N/A  . 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 Smoking status: Former Smoker    Packs/day: 1.00    Years: 32.00    Quit date: 2012  . Smokeless tobacco: Never Used     Comment: patient vapes  . Alcohol use No     Comment: occasional drink.  . Drug use: No  . Sexual activity: Not Asked   Other Topics Concern  . None   Social History Narrative   Lives in [place] with husband.  Systems analyst.  Also in school @ [employer] for [place] [place].  Does not routinely exercise.     Family History:  The patient's family history is not on file.  Family history is notable in that her uncle  had sleep apnea but has not utilized treatment and he gave her his machine.  ROS General: Negative; No fevers, chills, or night sweats;  HEENT: Negative; No changes in vision or hearing, sinus congestion, difficulty swallowing Pulmonary: Negative; No cough, wheezing, shortness of breath, hemoptysis Cardiovascular: History of PAF GI: Negative; No nausea, vomiting, diarrhea, or  abdominal pain GU: Negative; No dysuria, hematuria, or difficulty voiding Musculoskeletal: Negative; no myalgias, joint pain, or weakness Hematologic/Oncology: Negative; no easy bruising, bleeding Endocrine: Negative; no heat/cold intolerance; no diabetes Neuro: Negative; no changes in balance, headaches Skin: Negative; No rashes or skin lesions Psychiatric: Negative; No behavioral problems, depression Sleep: Positive for snoring, daytime sleepiness, hypersomnolence;  Nobruxism, restless legs, hypnogognic hallucinations, no cataplexy  Other comprehensive 14 point system review is negative.   PHYSICAL EXAM:   VS:  BP 139/90   Pulse 81   Ht '5\' 7"'$  (1.702 m)   Wt (!) 303 lb 9.6 oz (137.7 kg)   SpO2 (!) 86%   BMI 47.55 kg/m     Repeat blood pressure by me was 128/84  Wt Readings from Last 3 Encounters:  10/02/17 (!) 303 lb 9.6 oz (137.7 kg)  06/16/17 (!) 302 lb 6.4 oz (137.2 kg)  04/27/17 (!) 302 lb 12.8 oz (137.3 kg)    General: Alert, oriented, no distress.  Morbidly obese Skin: normal turgor, no rashes, warm and dry HEENT: Normocephalic, atraumatic. Pupils equal round and reactive to light; sclera anicteric; extraocular muscles intact;  Nose without nasal septal hypertrophy Mouth/Parynx benign; Mallinpatti scale 4 Neck: No JVD, no carotid bruits; normal carotid upstroke Lungs: clear to ausculatation and percussion; no wheezing or rales Chest wall: without tenderness to palpitation Heart: PMI not displaced, RRR, s1 s2 normal, 1/6 systolic murmur, no diastolic murmur, no rubs, gallops, thrills, or heaves Abdomen: soft, nontender; no hepatosplenomehaly, BS+; abdominal aorta nontender and not dilated by palpation. Back: no CVA tenderness Pulses 2+ Musculoskeletal: full range of motion, normal strength, no joint deformities Extremities: no clubbing cyanosis or edema, Homan's sign negative  Neurologic: grossly nonfocal; Cranial nerves grossly wnl Psychologic: Normal mood and  affect   Studies/Labs Reviewed:   EKG:  EKG is ordered today.  ECG (independently read by me): Normal sinus rhythm at 73 bpm.  No ectopy.  Normal intervals.  Recent Labs: BMP Latest Ref Rng & Units 08/29/2016 05/04/2009 03/15/2009  Glucose 65 - 99 mg/dL 137(H) 94 119(H)  BUN 6 - 20 mg/dL '10 13 12  '$ Creatinine 0.44 - 1.00 mg/dL 0.51 0.61 0.56  Sodium 135 - 145 mmol/L 140 139 140  Potassium 3.5 - 5.1 mmol/L 4.1 4.5 3.3(L)  Chloride 101 - 111 mmol/L 103 109 105  CO2 22 - 32 mmol/L '29 27 24  '$ Calcium 8.9 - 10.3 mg/dL 9.3 9.4 9.1     Hepatic Function Latest Ref Rng & Units 05/04/2009  Total Protein 6.0 - 8.3 g/dL 7.5  Albumin 3.5 - 5.2 g/dL 4.1  AST 0 - 37 U/L 38(H)  ALT 0 - 35 U/L 19  Alk Phosphatase 39 - 117 U/L 45  Total Bilirubin 0.3 - 1.2 mg/dL 0.7    CBC Latest Ref Rng & Units 08/29/2016 05/04/2009 03/14/2009  WBC 4.0 - 10.5 K/uL 8.5 9.9 -  Hemoglobin 12.0 - 15.0 g/dL 14.6 10.7(L) 14.6  Hematocrit 36.0 - 46.0 % 47.2(H) 35.1(L) 43.0  Platelets 150 - 400 K/uL 282 279 -   Lab Results  Component Value Date   MCV 96.5 08/29/2016   MCV 81.4 05/04/2009   MCV 84.5 03/14/2009   Lab Results  Component Value Date   TSH 1.47 10/11/2016   No results found for: HGBA1C   BNP No results found for: BNP  ProBNP No results found for: PROBNP   Lipid Panel     Component Value Date/Time   CHOL  03/15/2009 0517    123        ATP III CLASSIFICATION:  <200     mg/dL   Desirable  200-239  mg/dL   Borderline High  >=240    mg/dL   High          TRIG 85 03/15/2009 0517   HDL 40 03/15/2009 0517   CHOLHDL 3.1 03/15/2009 0517   VLDL  17 03/15/2009 0517   LDLCALC  03/15/2009 0517    66        Total Cholesterol/HDL:CHD Risk Coronary Heart Disease Risk Table                     Men   Women  1/2 Average Risk   3.4   3.3  Average Risk       5.0   4.4  2 X Average Risk   9.6   7.1  3 X Average Risk  23.4   11.0        Use the calculated Patient Ratio above and the CHD Risk Table to  determine the patient's CHD Risk.        ATP III CLASSIFICATION (LDL):  <100     mg/dL   Optimal  100-129  mg/dL   Near or Above                    Optimal  130-159  mg/dL   Borderline  160-189  mg/dL   High  >190     mg/dL   Very High     RADIOLOGY: No results found.   Additional studies/ records that were reviewed today include:  I have reviewed the patient's prior ER evaluation for cardioversion, A. fib clinic note, and evaluation by Ignacia Bayley, NP    ASSESSMENT:    1. Severe obstructive sleep apnea   2. Morbid obesity (HCC)   3. Paroxysmal atrial fibrillation ([place])   4. Essential hypertension   5. Type 2 diabetes mellitus without complication, without long-term current use of insulin [facility])      PLAN:  Ms. Dorinne Graeff is a 51 year old female who is morbidly obese with a BMI of 47.36, and has a history of hypertension, hyperthyroidism treated with His all, paroxysmal atrial fibrillation, type 2 diabetes mellitus, and was diagnosed as having very severe sleep apnea.  She failed CPAP therapy and required maximum BiPAP therapy at 25/21 for optimal treatment. .  She never instituted BiPAP therapy due to lack of insurance.  Past several months she has been using a friend's CPAP unit.  She did not bring this with her today.  Maxim CPAP pressure is 20.  I discussed with her the fact that she felt she was not getting enough air and had taken her mask off at night.  During the sensations was counter to what actually she needed.  In fact, if she needed more care.  Due to the CPAP pressure not able to supply it.  The last thing that she would want to do is to take her CPAP mask off.  She will go home after this office visit and [facility] so that hopefully we can get a download from her last several months.  Use to see where she stands.  We have written prescription for the BiPAP and for her to get financial assistance for this.  I discussed the importance of  weight loss as well as increased exercise.  Since her last office visit.  She states she has lost several pounds and has been trying to exercise at least 30 minutes a day.  I will contact her regarding her download results when available.  She is to call us to schedule a follow-up appointment once hopefully she gets set up with her BiPAP machine.

## 2017-10-02 NOTE — Telephone Encounter (Signed)
New message     Pt is returning [name] call about sleep apnea machine

## 2017-10-04 NOTE — Telephone Encounter (Signed)
Left detailed message that Angie Fava is not here and will forward message to her and that she already faxed note over to [facility] already 09-15-17

## 2017-10-04 NOTE — Telephone Encounter (Signed)
Follow up      Patient calling to speak with nurse from this week regarding [place].

## 2017-10-05 NOTE — Telephone Encounter (Signed)
Left message to call back  

## 2017-10-05 NOTE — Telephone Encounter (Signed)
Follow up     Pt calling back returning [facility] call

## 2017-10-05 NOTE — Telephone Encounter (Signed)
Haley [name] is returning your call

## 2017-10-06 NOTE — Telephone Encounter (Signed)
Returned call to pt she states that she called [facility] and they told her that the orders are not the same, or wrong she doesn't remember. Could you call them and see what they are talking about

## 2017-10-06 NOTE — Telephone Encounter (Signed)
Follow up     Pt is calling back , would like a call back today to speak with someone

## 2017-10-06 NOTE — Telephone Encounter (Signed)
Follow up ° ° ° °Patient returning call.  Please call °

## 2017-10-06 NOTE — Telephone Encounter (Signed)
Left message to call back  

## 2017-10-10 NOTE — Telephone Encounter (Signed)
Spoke to [place] at [facility] that we are trying to get download from patients current CPAP machine (that she is currently using as she is unable to get a Bipap machine due to financial reasons) in order to make changes to her current machine/settings.     Melissa request order placed and they will call to schedule her to come in to get a download.     Patient made aware and verbalized understanding.  Will notify me once she goes to [facility] or if she does not hear back from them.

## 2017-10-10 NOTE — Telephone Encounter (Signed)
Spoke to patient, patient states they were unable to get a download on her current machine when she went to [facility] after her OV with Dr. Claiborne Billings.   Advised we are unable to make changes until we have a download with the current settings and readings.   Informed patient I would call [facility] to see if they are able to do this.      Left message for Melissa at [facility] to call back.

## 2017-10-16 ENCOUNTER — Telehealth: Payer: Self-pay | Admitting: Cardiovascular Disease

## 2017-10-16 NOTE — Telephone Encounter (Signed)
F/U Call:  Patient states that [facility] did not receive the information you sent. Patient aware that  You are out of the office

## 2017-10-17 NOTE — Telephone Encounter (Signed)
Follow up     Patient calling back to speak with nurse - [facility]

## 2017-10-18 NOTE — Telephone Encounter (Signed)
Left message to call back  

## 2017-10-18 NOTE — Telephone Encounter (Signed)
Patient aware [facility] will reach out to her to get a download.   Verified with Melissa at [facility].

## 2017-10-24 ENCOUNTER — Other Ambulatory Visit: Payer: Self-pay

## 2017-10-24 MED ORDER — METOPROLOL TARTRATE 25 MG PO TABS: 25.0000 mg | ORAL_TABLET | Freq: Every day | ORAL | 6 refills | Status: DC

## 2018-03-21 ENCOUNTER — Encounter (HOSPITAL_BASED_OUTPATIENT_CLINIC_OR_DEPARTMENT_OTHER): Payer: Self-pay | Admitting: *Deleted

## 2018-03-21 ENCOUNTER — Emergency Department (HOSPITAL_BASED_OUTPATIENT_CLINIC_OR_DEPARTMENT_OTHER)
Admission: EM | Admit: 2018-03-21 | Discharge: 2018-03-21 | Disposition: A | Discharge status: Home / Self Care | Payer: Self-pay | Attending: Emergency Medicine | Admitting: Emergency Medicine

## 2018-03-21 ENCOUNTER — Emergency Department (HOSPITAL_BASED_OUTPATIENT_CLINIC_OR_DEPARTMENT_OTHER): Payer: Self-pay

## 2018-03-21 ENCOUNTER — Other Ambulatory Visit: Payer: Self-pay

## 2018-03-21 DIAGNOSIS — Z7984 Long term (current) use of oral hypoglycemic drugs: Secondary | ICD-10-CM | POA: Insufficient documentation

## 2018-03-21 DIAGNOSIS — Z7901 Long term (current) use of anticoagulants: Secondary | ICD-10-CM | POA: Insufficient documentation

## 2018-03-21 DIAGNOSIS — Z87891 Personal history of nicotine dependence: Secondary | ICD-10-CM | POA: Insufficient documentation

## 2018-03-21 DIAGNOSIS — Z91148 Patient's other noncompliance with medication regimen for other reason: Secondary | ICD-10-CM

## 2018-03-21 DIAGNOSIS — I10 Essential (primary) hypertension: Secondary | ICD-10-CM | POA: Insufficient documentation

## 2018-03-21 DIAGNOSIS — R0602 Shortness of breath: Secondary | ICD-10-CM | POA: Insufficient documentation

## 2018-03-21 DIAGNOSIS — Z9114 Patient's other noncompliance with medication regimen: Secondary | ICD-10-CM | POA: Insufficient documentation

## 2018-03-21 DIAGNOSIS — E119 Type 2 diabetes mellitus without complications: Secondary | ICD-10-CM | POA: Insufficient documentation

## 2018-03-21 DIAGNOSIS — R739 Hyperglycemia, unspecified: Secondary | ICD-10-CM

## 2018-03-21 DIAGNOSIS — Z79899 Other long term (current) drug therapy: Secondary | ICD-10-CM | POA: Insufficient documentation

## 2018-03-21 LAB — CBG MONITORING, ED
GLUCOSE-CAPILLARY: 233 mg/dL — AB (ref 65–99)
Glucose-Capillary: 301 mg/dL — ABNORMAL HIGH (ref 65–99)

## 2018-03-21 LAB — URINALYSIS, MICROSCOPIC (REFLEX)

## 2018-03-21 LAB — COMPREHENSIVE METABOLIC PANEL
ALT: 20 U/L (ref 14–54)
ANION GAP: 9 (ref 5–15)
AST: 23 U/L (ref 15–41)
Albumin: 4.3 g/dL (ref 3.5–5.0)
Alkaline Phosphatase: 53 U/L (ref 38–126)
BUN: 9 mg/dL (ref 6–20)
CHLORIDE: 98 mmol/L — AB (ref 101–111)
CO2: 27 mmol/L (ref 22–32)
Calcium: 9.3 mg/dL (ref 8.9–10.3)
Creatinine, Ser: 0.48 mg/dL (ref 0.44–1.00)
GFR calc non Af Amer: >60 mL/min (ref >60)
Glucose, Bld: 325 mg/dL — ABNORMAL HIGH (ref 65–99)
Potassium: 3.6 mmol/L (ref 3.5–5.1)
SODIUM: 134 mmol/L — AB (ref 135–145)
Total Bilirubin: 0.6 mg/dL (ref 0.3–1.2)
Total Protein: 8.1 g/dL (ref 6.5–8.1)

## 2018-03-21 LAB — CBC WITH DIFFERENTIAL/PLATELET
BASOS PCT: 1 %
Basophils Absolute: 0 10*3/uL (ref 0.0–0.1)
Eosinophils Absolute: 0.1 10*3/uL (ref 0.0–0.7)
Eosinophils Relative: 2 %
HEMATOCRIT: 47.1 % — AB (ref 36.0–46.0)
HEMOGLOBIN: 15.4 g/dL — AB (ref 12.0–15.0)
LYMPHS ABS: 2.7 10*3/uL (ref 0.7–4.0)
Lymphocytes Relative: 39 %
MCH: 29.7 pg (ref 26.0–34.0)
MCHC: 32.7 g/dL (ref 30.0–36.0)
MCV: 90.9 fL (ref 78.0–100.0)
Monocytes Absolute: 0.7 10*3/uL (ref 0.1–1.0)
Monocytes Relative: 10 %
NEUTROS ABS: 3.4 10*3/uL (ref 1.7–7.7)
NEUTROS PCT: 48 %
Platelets: 220 10*3/uL (ref 150–400)
RBC: 5.18 MIL/uL — AB (ref 3.87–5.11)
RDW: 13.4 % (ref 11.5–15.5)
WBC: 6.9 10*3/uL (ref 4.0–10.5)

## 2018-03-21 LAB — URINALYSIS, ROUTINE W REFLEX MICROSCOPIC
BILIRUBIN URINE: NEGATIVE
Glucose, UA: >=500 mg/dL — AB
Hgb urine dipstick: NEGATIVE
Ketones, ur: 15 mg/dL — AB
Leukocytes, UA: NEGATIVE
NITRITE: NEGATIVE
PH: 6 (ref 5.0–8.0)
Protein, ur: 100 mg/dL — AB

## 2018-03-21 MED ORDER — SODIUM CHLORIDE 0.9 % IV BOLUS
1000.0000 mL | Freq: Once | INTRAVENOUS | Status: AC
  Administered 2018-03-21: 1000 mL via INTRAVENOUS

## 2018-03-21 MED ORDER — METOPROLOL TARTRATE 25 MG PO TABS: 25.0000 mg | ORAL_TABLET | Freq: Every day | ORAL | 0 refills | Status: DC

## 2018-03-21 MED ORDER — METFORMIN HCL 1000 MG PO TABS: 1000.0000 mg | ORAL_TABLET | Freq: Two times a day (BID) | ORAL | 0 refills | Status: DC

## 2018-03-21 MED ORDER — GLIPIZIDE 10 MG PO TABS: 10.0000 mg | ORAL_TABLET | Freq: Every day | ORAL | 0 refills | Status: DC

## 2018-03-21 NOTE — ED Triage Notes (Signed)
Pt c/o SOB x 2 weeks, pt states she is out of her medication

## 2018-03-21 NOTE — ED Provider Notes (Signed)
[facility] EMERGENCY DEPARTMENT Provider Note   CSN: [id number] Arrival date & time: 03/21/18  1434     History   Chief Complaint Chief Complaint  Patient presents with  . Shortness of Breath    HPI Haley [name] is a 52 y.o. female.  HPI Patient states that she is dizzy.  States she is a little short of breath also.  States she feels as if she is drunk.  She is off her metformin for the last few weeks.  States that she does not have insurance and ran out of the medicine.  States she is also out of her metoprolol but still has her Eliquis.  States her sugars been running in the 4 and 500s.  States she has not been sleeping well because she has to get up at night to urinate.  No fevers or chills.  States she braces when she is at church and sometimes gets more short of breath with that.  States she is lost about 20 pounds over the last 6 months but has been eating more healthy.  Denies vaginal bleeding or discharge.  Denies dysuria.  States that she is thirsty all the time.  Has a history of atrial fibrillation but states she does not feels if she is in A. fib at this time. Past Medical History:  Diagnosis Date  . Essential hypertension   . Fibroids   . Hyperthyroidism    a. 2010, treated w/ tapazole.  . Morbid obesity ([place])   . Paroxysmal atrial fibrillation ([place])    a. 03/2009 - in setting of hyperthyroidism and caffeine intake, converted spontaneously;  b. 08/2016 ED visit for recurrent PAF-->successful DCCV in ED.  [name] Snores   . Type II diabetes mellitus [facility])     Patient Active Problem List   Diagnosis Date Noted  . Severe obstructive sleep apnea 01/14/2017  . Hypoxemia 01/14/2017  . Paroxysmal atrial fibrillation (HCC)   . Essential hypertension     Past Surgical History:  Procedure Laterality Date  . UTERINE FIBROID SURGERY       OB History   None      Home Medications    Prior to Admission medications   Medication Sig Start Date End Date  Taking? Authorizing Provider  apixaban (ELIQUIS) 5 MG TABS tablet Take 1 tablet (5 mg total) by mouth 2 (two) times daily. 04/18/17   Sherran Needs, NP  cyclobenzaprine (FLEXERIL) 10 MG tablet Take 10 mg by mouth at bedtime as needed for muscle spasms.     [provider]  glipiZIDE (GLUCOTROL) 10 MG tablet Take 1 tablet (10 mg total) by mouth daily before breakfast. 03/21/18   Davonna Belling, MD  metFORMIN (GLUCOPHAGE) 1000 MG tablet Take 1 tablet (1,000 mg total) by mouth 2 (two) times daily with a meal. 03/21/18   Davonna Belling, MD  metoprolol tartrate (LOPRESSOR) 25 MG tablet Take 1 tablet (25 mg total) by mouth daily. 03/21/18   Davonna Belling, MD  Multiple Vitamins-Minerals (MULTIVITAMIN ADULTS) TABS Take 1 tablet by mouth daily.    [provider]  naproxen (NAPROSYN) 500 MG tablet Take 500 mg by mouth every morning.     [provider]    Family History History reviewed. No pertinent family history.  Social History Social History   Tobacco Use  . Smoking status: Former Smoker    Packs/day: 1.00    Years: 32.00    Pack years: 32.00    Last attempt to quit:  2012    Years since quitting: 7.2  . Smokeless tobacco: Never Used  . Tobacco comment: patient vapes  Substance Use Topics  . Alcohol use: No    Comment: occasional drink.  . Drug use: No     Allergies   Patient has no known allergies.   Review of Systems Review of Systems  Constitutional: Positive for appetite change. Negative for fever.  HENT: Negative for congestion.   Respiratory: Negative for shortness of breath.   Cardiovascular: Negative for chest pain and palpitations.  Gastrointestinal: Negative for abdominal pain.  Endocrine: Positive for polydipsia, polyphagia and polyuria.  Genitourinary: Negative for flank pain.  Musculoskeletal: Negative for back pain.  Neurological: Positive for dizziness.  Hematological: Negative for adenopathy.  Psychiatric/Behavioral:  Negative for confusion.     Physical Exam Updated Vital Signs BP 125/73 (BP Location: Right Arm)   Pulse 79   Temp 98.1 F (36.7 C) (Oral)   Resp 18   Ht 5\' 7"  (1.702 m)   Wt 130.6 kg (288 lb)   SpO2 90%   BMI 45.11 kg/m   Physical Exam  Constitutional: She appears well-developed.  HENT:  Head: Atraumatic.  Eyes: Pupils are equal, round, and reactive to light.  Neck: Neck supple.  Cardiovascular: Regular rhythm.  Pulmonary/Chest: Effort normal.  Abdominal: Soft. There is no tenderness.  Musculoskeletal:       Right lower leg: She exhibits no edema.       Left lower leg: She exhibits no edema.  Neurological: She is alert.  Skin: Skin is warm. Capillary refill takes less than 2 seconds.     ED Treatments / Results  Labs (all labs ordered are listed, but only abnormal results are displayed) Labs Reviewed  COMPREHENSIVE METABOLIC PANEL - Abnormal; Notable for the following components:      Result Value   Sodium 134 (*)    Chloride 98 (*)    Glucose, Bld 325 (*)    All other components within normal limits  CBC WITH DIFFERENTIAL/PLATELET - Abnormal; Notable for the following components:   RBC 5.18 (*)    Hemoglobin 15.4 (*)    HCT 47.1 (*)    All other components within normal limits  URINALYSIS, ROUTINE W REFLEX MICROSCOPIC - Abnormal; Notable for the following components:   Specific Gravity, Urine >1.030 (*)    Glucose, UA >=500 (*)    Ketones, ur 15 (*)    Protein, ur 100 (*)    All other components within normal limits  URINALYSIS, MICROSCOPIC (REFLEX) - Abnormal; Notable for the following components:   Bacteria, UA RARE (*)    Squamous Epithelial / LPF 0-5 (*)    All other components within normal limits  CBG MONITORING, ED - Abnormal; Notable for the following components:   Glucose-Capillary 301 (*)    All other components within normal limits  CBG MONITORING, ED - Abnormal; Notable for the following components:   Glucose-Capillary 233 (*)    All other  components within normal limits    EKG EKG Interpretation  Date/Time:  Wednesday March 21 2018 15:37:30 EDT Ventricular Rate:  80 PR Interval:    QRS Duration: 92 QT Interval:  355 QTC Calculation: 410 R Axis:   82 Text Interpretation:  Sinus rhythm Borderline T wave abnormalities Confirmed by Davonna Belling [phone]) on 03/21/2018 3:46:38 PM Also confirmed by Davonna Belling [phone]), editor Shon Hale [phone])  on 03/21/2018 3:53:58 PM   Radiology Dg Chest 2 View  Result Date: 03/21/2018 CLINICAL  DATA:  Short of breath EXAM: CHEST - 2 VIEW COMPARISON:  08/09/2016 FINDINGS: The heart size and mediastinal contours are within normal limits. Both lungs are clear. The visualized skeletal structures are unremarkable. IMPRESSION: No active cardiopulmonary disease. Electronically Signed   By: Franchot Gallo M.D.   On: 03/21/2018 15:39    Procedures Procedures (including critical care time)  Medications Ordered in ED Medications  sodium chloride 0.9 % bolus 1,000 mL (1,000 mLs Intravenous New Bag/Given 03/21/18 1545)     Initial Impression / Assessment and Plan / ED Course  I have reviewed the triage vital signs and the nursing notes.  Pertinent labs & imaging results that were available during my care of the patient were reviewed by me and considered in my medical decision making (see chart for details).     Patient with dizziness.  Some mild shortness of breath.  Sats have been running a little low but has severe sleep apnea and has not been using her CPAP.  Lungs are clear.  Already on Eliquis for A. fib.  EKG reassuring.  X-ray reassuring.  Is hyperglycemic.  Has been off her medications.  Refilled medications.  Fluid bolus given in sugar down to 200.  Discharge home to follow-up.  Patient was given a referral to try to adult and pediatric medicine since she could not afford her previous primary care doctor.  She was given prescription for her previous medications.  Final  Clinical Impressions(s) / ED Diagnoses   Final diagnoses:  Hyperglycemia  Noncompliance with medication regimen    ED Discharge Orders        Ordered    metFORMIN (GLUCOPHAGE) 1000 MG tablet  2 times daily with meals     03/21/18 1856    glipiZIDE (GLUCOTROL) 10 MG tablet  Daily before breakfast     03/21/18 1856    metoprolol tartrate (LOPRESSOR) 25 MG tablet  Daily     03/21/18 1856       Davonna Belling, MD 03/21/18 1859

## 2018-06-26 ENCOUNTER — Encounter (HOSPITAL_COMMUNITY): Payer: Self-pay

## 2018-06-26 ENCOUNTER — Other Ambulatory Visit: Payer: Self-pay

## 2018-06-26 ENCOUNTER — Emergency Department (HOSPITAL_COMMUNITY)
Admission: EM | Admit: 2018-06-26 | Discharge: 2018-06-26 | Disposition: A | Discharge status: Home / Self Care | Payer: Self-pay | Attending: Emergency Medicine | Admitting: Emergency Medicine

## 2018-06-26 DIAGNOSIS — I10 Essential (primary) hypertension: Secondary | ICD-10-CM | POA: Insufficient documentation

## 2018-06-26 DIAGNOSIS — R739 Hyperglycemia, unspecified: Secondary | ICD-10-CM

## 2018-06-26 DIAGNOSIS — E1165 Type 2 diabetes mellitus with hyperglycemia: Secondary | ICD-10-CM | POA: Insufficient documentation

## 2018-06-26 DIAGNOSIS — Z87891 Personal history of nicotine dependence: Secondary | ICD-10-CM | POA: Insufficient documentation

## 2018-06-26 LAB — CBG MONITORING, ED
Glucose-Capillary: 499 mg/dL — ABNORMAL HIGH (ref 70–99)
Glucose-Capillary: 366 mg/dL — ABNORMAL HIGH (ref 70–99)
Glucose-Capillary: 373 mg/dL — ABNORMAL HIGH (ref 70–99)

## 2018-06-26 LAB — BASIC METABOLIC PANEL
ANION GAP: 13 (ref 5–15)
BUN: 10 mg/dL (ref 6–20)
CO2: 24 mmol/L (ref 22–32)
Calcium: 9.5 mg/dL (ref 8.9–10.3)
Chloride: 97 mmol/L — ABNORMAL LOW (ref 98–111)
Creatinine, Ser: 0.69 mg/dL (ref 0.44–1.00)
GFR calc Af Amer: >60 mL/min (ref >60)
Glucose, Bld: 537 mg/dL (ref 70–99)
POTASSIUM: 4.1 mmol/L (ref 3.5–5.1)
Sodium: 134 mmol/L — ABNORMAL LOW (ref 135–145)

## 2018-06-26 LAB — CBC
HEMATOCRIT: 47.9 % — AB (ref 36.0–46.0)
Hemoglobin: 15.4 g/dL — ABNORMAL HIGH (ref 12.0–15.0)
MCH: 30.5 pg (ref 26.0–34.0)
MCHC: 32.2 g/dL (ref 30.0–36.0)
MCV: 94.9 fL (ref 78.0–100.0)
PLATELETS: 238 10*3/uL (ref 150–400)
RBC: 5.05 MIL/uL (ref 3.87–5.11)
RDW: 13.4 % (ref 11.5–15.5)
WBC: 7.9 10*3/uL (ref 4.0–10.5)

## 2018-06-26 LAB — URINALYSIS, ROUTINE W REFLEX MICROSCOPIC
Bacteria, UA: NONE SEEN
Bilirubin Urine: NEGATIVE
HGB URINE DIPSTICK: NEGATIVE
KETONES UR: NEGATIVE mg/dL
LEUKOCYTES UA: NEGATIVE
NITRITE: NEGATIVE
PROTEIN: NEGATIVE mg/dL
Specific Gravity, Urine: 1.029 (ref 1.005–1.030)
pH: 6 (ref 5.0–8.0)

## 2018-06-26 LAB — I-STAT BETA HCG BLOOD, ED (MC, WL, AP ONLY): I-stat hCG, quantitative: <5 m[IU]/mL (ref <5)

## 2018-06-26 MED ORDER — GLIPIZIDE 10 MG PO TABS: 10.0000 mg | ORAL_TABLET | Freq: Every day | ORAL | 0 refills | Status: DC

## 2018-06-26 MED ORDER — METOPROLOL TARTRATE 25 MG PO TABS: 25.0000 mg | ORAL_TABLET | Freq: Every day | ORAL | 0 refills | Status: DC

## 2018-06-26 MED ORDER — APIXABAN 5 MG PO TABS: 5.0000 mg | ORAL_TABLET | Freq: Two times a day (BID) | ORAL | 0 refills | Status: DC

## 2018-06-26 MED ORDER — SODIUM CHLORIDE 0.9 % IV BOLUS
1000.0000 mL | Freq: Once | INTRAVENOUS | Status: AC
  Administered 2018-06-26: 1000 mL via INTRAVENOUS

## 2018-06-26 MED ORDER — METFORMIN HCL 1000 MG PO TABS: 1000.0000 mg | ORAL_TABLET | Freq: Two times a day (BID) | ORAL | 0 refills | Status: DC

## 2018-06-26 NOTE — ED Notes (Signed)
Date and time results received: 06/26/18 1920 Test: Glucose Critical Value: 537  Name of Provider Notified: Dr. Tamera Punt Orders Received? Or Actions Taken?:see orders

## 2018-06-26 NOTE — ED Triage Notes (Signed)
Pt states that she is a pre diabetic, and takes metformin. However, she does not have insurance, and has not been taking it as directed. Pt states she checked her sugar last night, and her meter read "high". 499 in triage.

## 2018-06-26 NOTE — ED Provider Notes (Signed)
[facility] DEPT Provider Note   CSN: [id number] Arrival date & time: 06/26/18  1749     History   Chief Complaint Chief Complaint  Patient presents with  . Hyperglycemia    HPI Haley [name] is a 52 y.o. female.  Patient is a 52 year old female with a history of diabetes who presents with hyperglycemia.  She states today she is felt like her sugars have been elevated.  She has had increased thirst and increased urination.  She is been a little bit more fatigued than normal.  No chest pain or shortness of breath.  No other recent illnesses.  She currently does not have insurance and only takes her metformin once a day instead of twice a day.  She is out of her other medications including the glipizide.     Past Medical History:  Diagnosis Date  . Essential hypertension   . Fibroids   . Hyperthyroidism    a. 2010, treated w/ tapazole.  . Morbid obesity ([place])   . Paroxysmal atrial fibrillation ([place])    a. 03/2009 - in setting of hyperthyroidism and caffeine intake, converted spontaneously;  b. 08/2016 ED visit for recurrent PAF-->successful DCCV in ED.  [name] Snores   . Type II diabetes mellitus [facility])     Patient Active Problem List   Diagnosis Date Noted  . Severe obstructive sleep apnea 01/14/2017  . Hypoxemia 01/14/2017  . Paroxysmal atrial fibrillation (HCC)   . Essential hypertension     Past Surgical History:  Procedure Laterality Date  . UTERINE FIBROID SURGERY       OB History   None      Home Medications    Prior to Admission medications   Medication Sig Start Date End Date Taking? Authorizing Provider  Multiple Vitamins-Minerals (MULTIVITAMIN ADULTS) TABS Take 1 tablet by mouth daily.   Yes [provider]  apixaban (ELIQUIS) 5 MG TABS tablet Take 1 tablet (5 mg total) by mouth 2 (two) times daily. 06/26/18   Malvin Johns, MD  glipiZIDE (GLUCOTROL) 10 MG tablet Take 1 tablet (10 mg total) by mouth daily  before breakfast. 06/26/18   Malvin Johns, MD  metFORMIN (GLUCOPHAGE) 1000 MG tablet Take 1 tablet (1,000 mg total) by mouth 2 (two) times daily with a meal. 06/26/18   Malvin Johns, MD  metoprolol tartrate (LOPRESSOR) 25 MG tablet Take 1 tablet (25 mg total) by mouth daily. 06/26/18   Malvin Johns, MD    Family History No family history on file.  Social History Social History   Tobacco Use  . Smoking status: Former Smoker    Packs/day: 1.00    Years: 32.00    Pack years: 32.00    Last attempt to quit: 2012    Years since quitting: 7.5  . Smokeless tobacco: Never Used  . Tobacco comment: patient vapes  Substance Use Topics  . Alcohol use: No    Comment: occasional drink.  . Drug use: No     Allergies   Patient has no known allergies.   Review of Systems Review of Systems  Constitutional: Positive for fatigue. Negative for chills, diaphoresis and fever.  HENT: Negative for congestion, rhinorrhea and sneezing.   Eyes: Negative.   Respiratory: Negative for cough, chest tightness and shortness of breath.   Cardiovascular: Negative for chest pain and leg swelling.  Gastrointestinal: Negative for abdominal pain, blood in stool, diarrhea, nausea and vomiting.  Endocrine: Positive for polydipsia and polyuria.  Genitourinary: Negative for difficulty  urinating, flank pain, frequency and hematuria.  Musculoskeletal: Negative for arthralgias and back pain.  Skin: Negative for rash.  Neurological: Negative for dizziness, speech difficulty, weakness, numbness and headaches.     Physical Exam Updated Vital Signs BP (!) 130/99   Pulse 81   Temp 98.4 F (36.9 C) (Oral)   Resp 16   Ht 5\' 7"  (1.702 m)   Wt 127.9 kg (282 lb)   SpO2 92%   BMI 44.17 kg/m   Physical Exam  Constitutional: She is oriented to person, place, and time. She appears well-developed and well-nourished.  HENT:  Head: Normocephalic and atraumatic.  Eyes: Pupils are equal, round, and reactive to  light.  Neck: Normal range of motion. Neck supple.  Cardiovascular: Normal rate, regular rhythm and normal heart sounds.  Pulmonary/Chest: Effort normal and breath sounds normal. No respiratory distress. She has no wheezes. She has no rales. She exhibits no tenderness.  Abdominal: Soft. Bowel sounds are normal. There is no tenderness. There is no rebound and no guarding.  Musculoskeletal: Normal range of motion. She exhibits no edema.  Lymphadenopathy:    She has no cervical adenopathy.  Neurological: She is alert and oriented to person, place, and time.  Skin: Skin is warm and dry. No rash noted.  Psychiatric: She has a normal mood and affect.     ED Treatments / Results  Labs (all labs ordered are listed, but only abnormal results are displayed) Labs Reviewed  BASIC METABOLIC PANEL - Abnormal; Notable for the following components:      Result Value   Sodium 134 (*)    Chloride 97 (*)    Glucose, Bld 537 (*)    All other components within normal limits  CBC - Abnormal; Notable for the following components:   Hemoglobin 15.4 (*)    HCT 47.9 (*)    All other components within normal limits  URINALYSIS, ROUTINE W REFLEX MICROSCOPIC - Abnormal; Notable for the following components:   Color, Urine STRAW (*)    Glucose, UA >=500 (*)    All other components within normal limits  CBG MONITORING, ED - Abnormal; Notable for the following components:   Glucose-Capillary 499 (*)    All other components within normal limits  CBG MONITORING, ED - Abnormal; Notable for the following components:   Glucose-Capillary 366 (*)    All other components within normal limits  CBG MONITORING, ED - Abnormal; Notable for the following components:   Glucose-Capillary 373 (*)    All other components within normal limits  I-STAT BETA HCG BLOOD, ED ([facility], [facility], [facility] ONLY)    EKG None  Radiology No results found.  Procedures Procedures (including critical care time)  Medications Ordered in  ED Medications  sodium chloride 0.9 % bolus 1,000 mL (0 mLs Intravenous Stopped 06/26/18 2153)     Initial Impression / Assessment and Plan / ED Course  I have reviewed the triage vital signs and the nursing notes.  Pertinent labs & imaging results that were available during my care of the patient were reviewed by me and considered in my medical decision making (see chart for details).    Patient is a 52 year old female with a history of diabetes who presents with hyperglycemia.  She has not been taking her medications as prescribed.  She also drinks full sugar sodas.  She has no evidence of DKA.  Her glucose is elevated but her other labs are non-concerning.  She denies any other symptoms.  No vomiting.  She was given IV fluids.  Her glucose has improved.  She was given prescriptions for all her medications.  She was given resources for possible outpatient follow-up.  Return precautions were given.  Final Clinical Impressions(s) / ED Diagnoses   Final diagnoses:  Hyperglycemia    ED Discharge Orders        Ordered    apixaban (ELIQUIS) 5 MG TABS tablet  2 times daily     06/26/18 2132    glipiZIDE (GLUCOTROL) 10 MG tablet  Daily before breakfast     06/26/18 2132    metFORMIN (GLUCOPHAGE) 1000 MG tablet  2 times daily with meals     06/26/18 2132    metoprolol tartrate (LOPRESSOR) 25 MG tablet  Daily     06/26/18 2132       Malvin Johns, MD 06/26/18 2227

## 2018-06-27 NOTE — Care Management Note (Signed)
Case Management Note  CM consulted for no pcp and no ins with need for medication assistance.  CM looked up pt's medications on goodrx.  Her glipizide is $4, lopressor is $5.39, and metformin is $4 at [employer].  There is no direct information that CM has available for Eliquis assistance for those uninsured.  CM called and spoke with pt about medications.  She advised she has qualified for free Eliquis in the past and she has the application to reinitiate this program.  CM also discussed financial counseling, pharmacy, [facility], and [place] providing phone numbers.  Pt had no further questions.  Updated Dr. Tamera Punt, via messages.  No further CM needs noted at this time.  Elisabella Hacker, Benjaman Lobe, RN 06/27/2018, 9:50 AM

## 2018-10-05 ENCOUNTER — Emergency Department (HOSPITAL_COMMUNITY): Payer: Self-pay

## 2018-10-05 ENCOUNTER — Other Ambulatory Visit: Payer: Self-pay

## 2018-10-05 ENCOUNTER — Emergency Department (HOSPITAL_COMMUNITY)
Admission: EM | Admit: 2018-10-05 | Discharge: 2018-10-05 | Disposition: A | Discharge status: Home / Self Care | Payer: Self-pay | Attending: Emergency Medicine | Admitting: Emergency Medicine

## 2018-10-05 ENCOUNTER — Encounter (HOSPITAL_COMMUNITY): Payer: Self-pay

## 2018-10-05 DIAGNOSIS — Z79899 Other long term (current) drug therapy: Secondary | ICD-10-CM | POA: Insufficient documentation

## 2018-10-05 DIAGNOSIS — Z87891 Personal history of nicotine dependence: Secondary | ICD-10-CM | POA: Insufficient documentation

## 2018-10-05 DIAGNOSIS — R5383 Other fatigue: Secondary | ICD-10-CM

## 2018-10-05 DIAGNOSIS — E119 Type 2 diabetes mellitus without complications: Secondary | ICD-10-CM

## 2018-10-05 DIAGNOSIS — R739 Hyperglycemia, unspecified: Secondary | ICD-10-CM

## 2018-10-05 DIAGNOSIS — Z7984 Long term (current) use of oral hypoglycemic drugs: Secondary | ICD-10-CM | POA: Insufficient documentation

## 2018-10-05 DIAGNOSIS — I10 Essential (primary) hypertension: Secondary | ICD-10-CM | POA: Insufficient documentation

## 2018-10-05 DIAGNOSIS — E1165 Type 2 diabetes mellitus with hyperglycemia: Secondary | ICD-10-CM | POA: Insufficient documentation

## 2018-10-05 LAB — CBC
HCT: 49.1 % — ABNORMAL HIGH (ref 36.0–46.0)
Hemoglobin: 15.2 g/dL — ABNORMAL HIGH (ref 12.0–15.0)
MCH: 29.5 pg (ref 26.0–34.0)
MCHC: 31 g/dL (ref 30.0–36.0)
MCV: 95.2 fL (ref 80.0–100.0)
NRBC: 0 % (ref 0.0–0.2)
PLATELETS: 240 10*3/uL (ref 150–400)
RBC: 5.16 MIL/uL — ABNORMAL HIGH (ref 3.87–5.11)
RDW: 12.5 % (ref 11.5–15.5)
WBC: 7.3 10*3/uL (ref 4.0–10.5)

## 2018-10-05 LAB — CBG MONITORING, ED: GLUCOSE-CAPILLARY: 320 mg/dL — AB (ref 70–99)

## 2018-10-05 LAB — BASIC METABOLIC PANEL
Anion gap: 10 (ref 5–15)
BUN: 12 mg/dL (ref 6–20)
CALCIUM: 9.6 mg/dL (ref 8.9–10.3)
CO2: 24 mmol/L (ref 22–32)
CREATININE: 0.67 mg/dL (ref 0.44–1.00)
Chloride: 97 mmol/L — ABNORMAL LOW (ref 98–111)
GFR calc non Af Amer: >60 mL/min (ref >60)
Glucose, Bld: 507 mg/dL (ref 70–99)
Potassium: 4.9 mmol/L (ref 3.5–5.1)
SODIUM: 131 mmol/L — AB (ref 135–145)

## 2018-10-05 LAB — URINALYSIS, ROUTINE W REFLEX MICROSCOPIC
BILIRUBIN URINE: NEGATIVE
Bacteria, UA: NONE SEEN
Glucose, UA: >=500 mg/dL — AB
Hgb urine dipstick: NEGATIVE
KETONES UR: 5 mg/dL — AB
Leukocytes, UA: NEGATIVE
NITRITE: NEGATIVE
PH: 5 (ref 5.0–8.0)
PROTEIN: NEGATIVE mg/dL
Specific Gravity, Urine: 1.029 (ref 1.005–1.030)

## 2018-10-05 LAB — I-STAT TROPONIN, ED: Troponin i, poc: 0 ng/mL (ref 0.00–0.08)

## 2018-10-05 MED ORDER — METOPROLOL TARTRATE 25 MG PO TABS: 25.0000 mg | ORAL_TABLET | Freq: Every day | ORAL | 1 refills | Status: DC

## 2018-10-05 MED ORDER — GLIPIZIDE 10 MG PO TABS: 10.0000 mg | ORAL_TABLET | Freq: Every day | ORAL | 1 refills | Status: DC

## 2018-10-05 MED ORDER — METFORMIN HCL 1000 MG PO TABS: 1000.0000 mg | ORAL_TABLET | Freq: Two times a day (BID) | ORAL | 1 refills | Status: DC

## 2018-10-05 NOTE — ED Triage Notes (Signed)
Pt here for evaluation of generalized fatigue since the end of august. Pt states she has been out of all her medications for about a month. Hx of a fib and diabetes. Wears CPAP at night but needs a new hose but does not have insurance so she has not been using it. Pt a.o, nad noted in triage.

## 2018-10-05 NOTE — ED Notes (Signed)
Pt to xray at this time.

## 2018-10-05 NOTE — Discharge Instructions (Addendum)
1.  Resume your prescribed medications. 2.  It is very important that you are seen by a family doctor as soon as possible for monitoring of your diabetes and blood pressure. B.  Return to the emergency department if symptoms are worsening or changing.

## 2018-10-05 NOTE — ED Provider Notes (Signed)
[facility] EMERGENCY DEPARTMENT Provider Note   CSN: [id number] Arrival date & time: 10/05/18  1309     History   Chief Complaint Chief Complaint  Patient presents with  . Fatigue  . Chest Pain    HPI Haley [name] is a 52 y.o. female.  HPI Patient reports that she has had usual amounts of fatigue over about the past 2 weeks.  She reports she is drinking a lot more water all the time and urinating every 2 hours at night.  No fever.  She reports she however does feel hot a lot of the time.  She is getting chest pain but not with exertion.  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  She reports however with exercise, she does not experience chest pain or shortness of breath.  She reports she does try to exercise on her stationary bike as well as doing aqua aerobics and does not get chest pain with those activities.  Cough or fever.  No swelling in the legs.  No vomiting or diarrhea.  Patient has been out of her metformin, glipizide and metoprolol for over a month.  She reports that she does have Eliquis which she is taking regularly as it was provided through a vendor support program. Past Medical History:  Diagnosis Date  . Essential hypertension   . Fibroids   . Hyperthyroidism    a. 2010, treated w/ tapazole.  . Morbid obesity ([place])   . Paroxysmal atrial fibrillation ([place])    a. 03/2009 - in setting of hyperthyroidism and caffeine intake, converted spontaneously;  b. 08/2016 ED visit for recurrent PAF-->successful DCCV in ED.  [name] Snores   . Type II diabetes mellitus [facility])     Patient Active Problem List   Diagnosis Date Noted  . Severe obstructive sleep apnea 01/14/2017  . Hypoxemia 01/14/2017  . Paroxysmal atrial fibrillation (HCC)   . Essential hypertension     Past Surgical History:  Procedure Laterality Date  . UTERINE FIBROID SURGERY        OB History   None      Home Medications    Prior to Admission medications   Medication Sig Start Date End Date Taking? Authorizing Provider  apixaban (ELIQUIS) 5 MG TABS tablet Take 1 tablet (5 mg total) by mouth 2 (two) times daily. 06/26/18   Malvin Johns, MD  glipiZIDE (GLUCOTROL) 10 MG tablet Take 1 tablet (10 mg total) by mouth daily before breakfast. 10/05/18   Charlesetta Shanks, MD  metFORMIN (GLUCOPHAGE) 1000 MG tablet Take 1 tablet (1,000 mg total) by mouth 2 (two) times daily with a meal. 10/05/18   Charlesetta Shanks, MD  metoprolol tartrate (LOPRESSOR) 25 MG tablet Take 1 tablet (25 mg total) by mouth daily. 10/05/18   Charlesetta Shanks, MD  Multiple Vitamins-Minerals (MULTIVITAMIN ADULTS) TABS Take 1 tablet by mouth daily.    [provider]    Family History No family history on file.  Social History Social History   Tobacco Use  . Smoking status: Former Smoker    Packs/day: 1.00    Years: 32.00    Pack years: 32.00    Last attempt to quit: 2012    Years since quitting: 7.8  . Smokeless tobacco: Never Used  . Tobacco comment: patient vapes  Substance Use Topics  . Alcohol use: No    Comment: occasional  drink.  . Drug use: No     Allergies   Patient has no known allergies.   Review of Systems Review of Systems 10 Systems reviewed and are negative for acute change except as noted in the HPI.  Physical Exam Updated Vital Signs BP 135/77 (BP Location: Right Arm)   Pulse 89   Temp 98.1 F (36.7 C) (Oral)   Resp 18   Ht 5\' 7"  (1.702 m)   Wt 128.4 kg   SpO2 96%   BMI 44.32 kg/m   Physical Exam  Constitutional: She is oriented to person, place, and time.  Patient is alert and nontoxic.  She is morbidly obese.  No respiratory distress.  General appearance is well.  HENT:  Head: Normocephalic and atraumatic.  Nose: Nose normal.  Mouth/Throat: Oropharynx is clear and moist.  Eyes: Pupils are equal, round, and reactive to light. EOM are  normal.  Neck: Neck supple.  Cardiovascular: Normal rate, regular rhythm, normal heart sounds and intact distal pulses.  Sinus rhythm on the monitor in the 70s.  Pulmonary/Chest: Effort normal and breath sounds normal.  Abdominal: Soft. She exhibits no distension. There is no tenderness. There is no guarding.  Musculoskeletal: Normal range of motion. She exhibits no edema or tenderness.  Neurological: She is alert and oriented to person, place, and time. No cranial nerve deficit. She exhibits normal muscle tone. Coordination normal.  Skin: Skin is warm and dry.  Psychiatric: She has a normal mood and affect.     ED Treatments / Results  Labs (all labs ordered are listed, but only abnormal results are displayed) Labs Reviewed  BASIC METABOLIC PANEL - Abnormal; Notable for the following components:      Result Value   Sodium 131 (*)    Chloride 97 (*)    Glucose, Bld 507 (*)    All other components within normal limits  CBC - Abnormal; Notable for the following components:   RBC 5.16 (*)    Hemoglobin 15.2 (*)    HCT 49.1 (*)    All other components within normal limits  URINALYSIS, ROUTINE W REFLEX MICROSCOPIC - Abnormal; Notable for the following components:   Color, Urine STRAW (*)    Glucose, UA >=500 (*)    Ketones, ur 5 (*)    All other components within normal limits  CBG MONITORING, ED - Abnormal; Notable for the following components:   Glucose-Capillary 320 (*)    All other components within normal limits  CBG MONITORING, ED  I-STAT TROPONIN, ED    EKG EKG Interpretation  Date/Time:  Friday October 05 2018 13:19:18 EDT Ventricular Rate:  89 PR Interval:  140 QRS Duration: 84 QT Interval:  352 QTC Calculation: 428 R Axis:   69 Text Interpretation:  Normal sinus rhythm Normal ECG normal no change from previous Confirmed by Charlesetta Shanks [id number]) on 10/05/2018 5:28:58 PM   Radiology Dg Chest 2 View  Result Date: 10/05/2018 CLINICAL DATA:  Evaluation of  generalized fatigue since end of August. EXAM: CHEST - 2 VIEW COMPARISON:  None. FINDINGS: Low lung volumes. Borderline cardiomegaly. Nonaneurysmal thoracic aorta. Mild central vascular congestion. Degenerative changes along the dorsal spine are stable. IMPRESSION: Low lung volumes with central vascular congestion. No active pulmonary disease. Electronically Signed   By: Ashley Royalty M.D.   On: 10/05/2018 18:11    Procedures Procedures (including critical care time)  Medications Ordered in ED Medications - No data to display   Initial Impression / Assessment and Plan /  ED Course  I have reviewed the triage vital signs and the nursing notes.  Pertinent labs & imaging results that were available during my care of the patient were reviewed by me and considered in my medical decision making (see chart for details).    Patient had run out of her Glucophage, Glucotrol and Lopressor for the past month.  Her general fatigue and polydipsia with polyuria are consistent with hyperglycemia.  Patient however has no acidosis, no mental status change, stable vital signs and otherwise normal lab work.  He has history of paroxysmal atrial fibrillation but fortunately has remained compliant with Eliquis.  Currently in sinus rhythm.  She is not experiencing any exertional chest pain.  I have low suspicion for cardiac ischemic etiology based on the current history.  Patient is in need of routine primary care.  She reports that she been very busy with her job but will be getting established again within the next several weeks.  Return precautions reviewed.  Final Clinical Impressions(s) / ED Diagnoses   Final diagnoses:  Type 2 diabetes mellitus without complication, without long-term current use of insulin (HCC)  Hyperglycemia  Other fatigue    ED Discharge Orders         Ordered    metoprolol tartrate (LOPRESSOR) 25 MG tablet  Daily     10/05/18 1740    metFORMIN (GLUCOPHAGE) 1000 MG tablet  2 times daily  with meals     10/05/18 1740    glipiZIDE (GLUCOTROL) 10 MG tablet  Daily before breakfast     10/05/18 1740           Charlesetta Shanks, MD 10/05/18 1909

## 2019-01-01 ENCOUNTER — Emergency Department (HOSPITAL_COMMUNITY): Payer: Self-pay

## 2019-01-01 ENCOUNTER — Other Ambulatory Visit: Payer: Self-pay

## 2019-01-01 ENCOUNTER — Observation Stay (HOSPITAL_COMMUNITY)
Admission: EM | Admit: 2019-01-01 | Discharge: 2019-01-02 | Disposition: A | Discharge status: Home / Self Care | Payer: Self-pay | Attending: Internal Medicine | Admitting: Internal Medicine

## 2019-01-01 ENCOUNTER — Emergency Department (HOSPITAL_BASED_OUTPATIENT_CLINIC_OR_DEPARTMENT_OTHER): Payer: Self-pay

## 2019-01-01 ENCOUNTER — Encounter (HOSPITAL_COMMUNITY): Payer: Self-pay | Admitting: Obstetrics and Gynecology

## 2019-01-01 DIAGNOSIS — I10 Essential (primary) hypertension: Secondary | ICD-10-CM | POA: Insufficient documentation

## 2019-01-01 DIAGNOSIS — Z7984 Long term (current) use of oral hypoglycemic drugs: Secondary | ICD-10-CM | POA: Insufficient documentation

## 2019-01-01 DIAGNOSIS — R0789 Other chest pain: Principal | ICD-10-CM | POA: Insufficient documentation

## 2019-01-01 DIAGNOSIS — R600 Localized edema: Secondary | ICD-10-CM | POA: Insufficient documentation

## 2019-01-01 DIAGNOSIS — Z7901 Long term (current) use of anticoagulants: Secondary | ICD-10-CM | POA: Insufficient documentation

## 2019-01-01 DIAGNOSIS — I480 Paroxysmal atrial fibrillation: Secondary | ICD-10-CM | POA: Insufficient documentation

## 2019-01-01 DIAGNOSIS — G4733 Obstructive sleep apnea (adult) (pediatric): Secondary | ICD-10-CM | POA: Insufficient documentation

## 2019-01-01 DIAGNOSIS — Z23 Encounter for immunization: Secondary | ICD-10-CM | POA: Insufficient documentation

## 2019-01-01 DIAGNOSIS — Z6841 Body Mass Index (BMI) 40.0 and over, adult: Secondary | ICD-10-CM | POA: Insufficient documentation

## 2019-01-01 DIAGNOSIS — R002 Palpitations: Secondary | ICD-10-CM

## 2019-01-01 DIAGNOSIS — Z8249 Family history of ischemic heart disease and other diseases of the circulatory system: Secondary | ICD-10-CM | POA: Insufficient documentation

## 2019-01-01 DIAGNOSIS — E0590 Thyrotoxicosis, unspecified without thyrotoxic crisis or storm: Secondary | ICD-10-CM | POA: Insufficient documentation

## 2019-01-01 DIAGNOSIS — Z79899 Other long term (current) drug therapy: Secondary | ICD-10-CM | POA: Insufficient documentation

## 2019-01-01 DIAGNOSIS — G4730 Sleep apnea, unspecified: Secondary | ICD-10-CM | POA: Diagnosis present

## 2019-01-01 DIAGNOSIS — E119 Type 2 diabetes mellitus without complications: Secondary | ICD-10-CM

## 2019-01-01 DIAGNOSIS — M7989 Other specified soft tissue disorders: Secondary | ICD-10-CM

## 2019-01-01 DIAGNOSIS — E1165 Type 2 diabetes mellitus with hyperglycemia: Secondary | ICD-10-CM | POA: Insufficient documentation

## 2019-01-01 DIAGNOSIS — Z87891 Personal history of nicotine dependence: Secondary | ICD-10-CM | POA: Insufficient documentation

## 2019-01-01 DIAGNOSIS — R0989 Other specified symptoms and signs involving the circulatory and respiratory systems: Secondary | ICD-10-CM | POA: Insufficient documentation

## 2019-01-01 LAB — BASIC METABOLIC PANEL
Anion gap: 9 (ref 5–15)
BUN: 9 mg/dL (ref 6–20)
CHLORIDE: 100 mmol/L (ref 98–111)
CO2: 28 mmol/L (ref 22–32)
Calcium: 9 mg/dL (ref 8.9–10.3)
Creatinine, Ser: 0.53 mg/dL (ref 0.44–1.00)
GFR calc Af Amer: >60 mL/min (ref >60)
Glucose, Bld: 203 mg/dL — ABNORMAL HIGH (ref 70–99)
POTASSIUM: 3.6 mmol/L (ref 3.5–5.1)
SODIUM: 137 mmol/L (ref 135–145)

## 2019-01-01 LAB — CBC
HCT: 43.9 % (ref 36.0–46.0)
HEMOGLOBIN: 13.5 g/dL (ref 12.0–15.0)
MCH: 30.5 pg (ref 26.0–34.0)
MCHC: 30.8 g/dL (ref 30.0–36.0)
MCV: 99.1 fL (ref 80.0–100.0)
NRBC: 0 % (ref 0.0–0.2)
Platelets: 221 10*3/uL (ref 150–400)
RBC: 4.43 MIL/uL (ref 3.87–5.11)
RDW: 12.5 % (ref 11.5–15.5)
WBC: 5.9 10*3/uL (ref 4.0–10.5)

## 2019-01-01 LAB — I-STAT BETA HCG BLOOD, ED (NOT ORDERABLE)

## 2019-01-01 LAB — POCT I-STAT TROPONIN I
Troponin i, poc: 0 ng/mL (ref 0.00–0.08)
Troponin i, poc: 0 ng/mL (ref 0.00–0.08)

## 2019-01-01 LAB — GLUCOSE, CAPILLARY: Glucose-Capillary: 194 mg/dL — ABNORMAL HIGH (ref 70–99)

## 2019-01-01 LAB — BRAIN NATRIURETIC PEPTIDE: B Natriuretic Peptide: 23.6 pg/mL (ref 0.0–100.0)

## 2019-01-01 LAB — D-DIMER, QUANTITATIVE: D-Dimer, Quant: 0.34 ug/mL-FEU (ref 0.00–0.50)

## 2019-01-01 MED ORDER — POTASSIUM CHLORIDE CRYS ER 20 MEQ PO TBCR
40.0000 meq | EXTENDED_RELEASE_TABLET | Freq: Once | ORAL | Status: AC
  Administered 2019-01-01: 40 meq via ORAL
  Filled 2019-01-01: qty 2

## 2019-01-01 MED ORDER — SODIUM CHLORIDE 0.9% FLUSH: 3.0000 mL | Freq: Once | INTRAVENOUS | Status: DC

## 2019-01-01 MED ORDER — MAGNESIUM SULFATE 2 GM/50ML IV SOLN
2.0000 g | Freq: Once | INTRAVENOUS | Status: AC
  Administered 2019-01-01: 2 g via INTRAVENOUS
  Filled 2019-01-01: qty 50

## 2019-01-01 MED ORDER — INSULIN ASPART 100 UNIT/ML ~~LOC~~ SOLN
0.0000 [IU] | Freq: Three times a day (TID) | SUBCUTANEOUS | Status: DC
  Administered 2019-01-02: 4 [IU] via SUBCUTANEOUS
  Administered 2019-01-02: 7 [IU] via SUBCUTANEOUS

## 2019-01-01 MED ORDER — SODIUM CHLORIDE 0.45 % IV SOLN
INTRAVENOUS | Status: AC
  Administered 2019-01-01: via INTRAVENOUS

## 2019-01-01 MED ORDER — ACETAMINOPHEN 325 MG PO TABS: 650.0000 mg | ORAL_TABLET | ORAL | Status: DC | PRN

## 2019-01-01 MED ORDER — INSULIN ASPART 100 UNIT/ML ~~LOC~~ SOLN: 0.0000 [IU] | Freq: Every day | SUBCUTANEOUS | Status: DC

## 2019-01-01 NOTE — ED Provider Notes (Signed)
[facility] DEPT Provider Note   CSN: [id number] Arrival date & time: 01/01/19  1156     History   Chief Complaint Chief Complaint  Patient presents with  . Palpitations    HPI Haley [name] is a 53 y.o. female with a hx of HTN, hyperthyroidism, obesity, OSA, T2DM, and paroxysmal afib anticoagulated on eliquis who presents to the ED with complaints of palpitations x 2 weeks. Patient describes her palpitations as feeling of heart racing/beating abnormally. She states the palpitations are worse when she is exerting herself and she developed associated dyspnea, chest discomfort, and lightheadedness. Somewhat alleviated with rest. She has had some URI sxs including congestion, rhinorrhea, sore throat, and productive cough with green mucous sputum, taking OTC cold medicine (Dayquil) without relief- initially she thought this may be the cause of her palpitations, but her palpitations started prior to these medicines. Of note she has been off of her Metoprolol & diabetic medicines x 2 weeks due to insurance issues and running out of the prescriptions. Upon further questioning she does admit to LE edema, L>R. Denies hemoptysis, recent surgery/trauma, recent long travel, hormone use, personal hx of cancer, or hx of DVT/PE.     HPI  Past Medical History:  Diagnosis Date  . Essential hypertension   . Fibroids   . Hyperthyroidism    a. 2010, treated w/ tapazole.  . Morbid obesity ([place])   . Paroxysmal atrial fibrillation ([place])    a. 03/2009 - in setting of hyperthyroidism and caffeine intake, converted spontaneously;  b. 08/2016 ED visit for recurrent PAF-->successful DCCV in ED.  [name] Snores   . Type II diabetes mellitus [employer])     Patient Active Problem List   Diagnosis Date Noted  . Severe obstructive sleep apnea 01/14/2017  . Hypoxemia 01/14/2017  . Paroxysmal atrial fibrillation (HCC)   . Essential hypertension     Past Surgical History:  Procedure  Laterality Date  . UTERINE FIBROID SURGERY       OB History   No obstetric history on file.      Home Medications    Prior to Admission medications   Medication Sig Start Date End Date Taking? Authorizing Provider  apixaban (ELIQUIS) 5 MG TABS tablet Take 1 tablet (5 mg total) by mouth 2 (two) times daily. 06/26/18   Malvin Johns, MD  glipiZIDE (GLUCOTROL) 10 MG tablet Take 1 tablet (10 mg total) by mouth daily before breakfast. 10/05/18   Charlesetta Shanks, MD  metFORMIN (GLUCOPHAGE) 1000 MG tablet Take 1 tablet (1,000 mg total) by mouth 2 (two) times daily with a meal. 10/05/18   Charlesetta Shanks, MD  metoprolol tartrate (LOPRESSOR) 25 MG tablet Take 1 tablet (25 mg total) by mouth daily. 10/05/18   Charlesetta Shanks, MD  Multiple Vitamins-Minerals (MULTIVITAMIN ADULTS) TABS Take 1 tablet by mouth daily.    [provider]    Family History No family history on file.  Social History Social History   Tobacco Use  . Smoking status: Former Smoker    Packs/day: 1.00    Years: 32.00    Pack years: 32.00    Last attempt to quit: 2012    Years since quitting: 8.0  . Smokeless tobacco: Never Used  . Tobacco comment: patient vapes  Substance Use Topics  . Alcohol use: No    Comment: occasional drink.  . Drug use: No     Allergies   Patient has no known allergies.   Review of Systems Review of  Systems  Constitutional: Negative for chills and fever.  HENT: Positive for congestion, rhinorrhea and sore throat.   Respiratory: Positive for shortness of breath.   Cardiovascular: Positive for chest pain, palpitations and leg swelling.  Gastrointestinal: Negative for abdominal pain, nausea and vomiting.  Neurological: Positive for light-headedness. Negative for syncope, weakness and numbness.  All other systems reviewed and are negative.    Physical Exam Updated Vital Signs BP (!) 150/73 (BP Location: Right Arm)   Pulse 97   Temp 99.2 F (37.3 C) (Oral)   Resp 16    Ht 5\' 7"  (1.702 m)   Wt 127 kg   SpO2 94%   BMI 43.85 kg/m   Physical Exam Vitals signs and nursing note reviewed.  Constitutional:      General: She is not in acute distress.    Appearance: She is well-developed.  HENT:     Head: Normocephalic and atraumatic.     Right Ear: Tympanic membrane is not perforated, erythematous, retracted or bulging.     Left Ear: Tympanic membrane is not perforated, erythematous, retracted or bulging.     Nose: Congestion present.     Mouth/Throat:     Pharynx: Uvula midline. No oropharyngeal exudate or posterior oropharyngeal erythema.  Eyes:     General:        Right eye: No discharge.        Left eye: No discharge.     Conjunctiva/sclera: Conjunctivae normal.     Pupils: Pupils are equal, round, and reactive to light.  Neck:     Musculoskeletal: Normal range of motion and neck supple.  Cardiovascular:     Rate and Rhythm: Normal rate and regular rhythm.     Heart sounds: No murmur. No friction rub.  Pulmonary:     Effort: Pulmonary effort is normal.     Breath sounds: Normal breath sounds. No wheezing or rales.     Comments: O2 on RA 90-95% when sitting upright, does desat to upper 80s when laying supine.  Abdominal:     General: There is no distension.     Palpations: Abdomen is soft.     Tenderness: There is no abdominal tenderness.  Musculoskeletal:     Comments: Pitting edema noted to the bilateral lower legs, L slightly worse when R. Normal AROM throughout. NO overlying erythema/warmth. No rashes. Nontender w/ soft compartments.   Lymphadenopathy:     Cervical: No cervical adenopathy.  Skin:    General: Skin is warm and dry.     Findings: No rash.  Neurological:     Mental Status: She is alert.     Comments: Sensation grossly intact to bilateral lower extremities. 5/5 strength with plantar/dorsiflexion bilaterally.   Psychiatric:        Behavior: Behavior normal.    ED Treatments / Results  Labs (all labs ordered are listed,  but only abnormal results are displayed) Labs Reviewed  BASIC METABOLIC PANEL - Abnormal; Notable for the following components:      Result Value   Glucose, Bld 203 (*)    All other components within normal limits  CBC  BRAIN NATRIURETIC PEPTIDE  D-DIMER, QUANTITATIVE (NOT AT [facility])  I-STAT TROPONIN, ED  I-STAT BETA HCG BLOOD, ED ([facility], [facility], [facility] ONLY)  I-STAT BETA HCG BLOOD, ED (NOT ORDERABLE)  POCT I-STAT TROPONIN I  I-STAT TROPONIN, ED  POCT I-STAT TROPONIN I    EKG EKG Interpretation  Date/Time:  Tuesday January 01 2019 12:21:29 EST Ventricular Rate:  95  PR Interval:    QRS Duration: 92 QT Interval:  332 QTC Calculation: 418 R Axis:   81 Text Interpretation:  Sinus rhythm No significant change since last tracing Confirmed by Blanchie Dessert [phone]) on 01/01/2019 6:32:05 PM   Radiology Dg Chest 2 View  Result Date: 01/01/2019 CLINICAL DATA:  Irregular heartbeat and chest pain, initial encounter EXAM: CHEST - 2 VIEW COMPARISON:  10/05/2018 FINDINGS: Cardiac shadows within normal limits. Mild vascular congestion is noted without interstitial edema. No sizable effusion or focal infiltrate is noted. Degenerative changes of lumbar spine are seen. IMPRESSION: Mild vascular congestion without interstitial edema. Electronically Signed   By: Inez Catalina M.D.   On: 01/01/2019 12:52    Procedures Procedures (including critical care time)  Medications Ordered in ED Medications  sodium chloride flush (NS) 0.9 % injection 3 mL (has no administration in time range)    Prior Results Reviewed:  Echocardiogram 11/02/16 Study Conclusions  - Left ventricle: The cavity size was normal. Wall thickness was   increased in a pattern of mild LVH. There was mild focal basal   hypertrophy of the septum. Systolic function was normal. The   estimated ejection fraction was in the range of 60% to 65%.   Doppler parameters are consistent with abnormal left ventricular   relaxation (grade 1  diastolic dysfunction). - Left atrium: The atrium was mildly dilated.   Initial Impression / Assessment and Plan / ED Course  I have reviewed the triage vital signs and the nursing notes.  Pertinent labs & imaging results that were available during my care of the patient were reviewed by me and considered in my medical decision making (see chart for details).   Patient presents with palpitations. Nontoxic appearing, in no apparent distress, vitals w/ mildly elevated BP and somewhat low SpO2 on RA (90-95% sitting upright, desats to upper 80s when laying down). Exam is fairly benign. Heart RRR without appreciable murmurs, no notable arrhythmias on monitor review or on EKG. Lungs CTA. Does have lower leg edema, L may be slightly worse than R, no overlying erythema/warmth to suggest infectious process.  Work-up reviewed:  CBC: No leukocytosis or anemia.  BMP: Hyperglycemia to 203, no acidosis or anion gap elevation to suggest DKA. No electrolyte derrangements.  Preg test: Negative EKG/Trop: NSR, no STEMI, delta trop negative, feel ACS is less likely.  D-dimer obtained negative & LLE [place] negative for DVT- doubt PE CXR: Mild vascular congestion without interstitial edema. She does have lower extremity edema. BNP added WNL.   Attempted to ambulate patient w/ SpO2 monitoring on RA: She maintained at 92% fairly consistently but became symptomatic with lightheadedness which correlated to desaturations into the upper 80s.   Discussed findings with plan of care with supervising physician Dr. Maryan Rued- in agreement with plan for admission for observation and possible echocardiogram.   21:57: CONSULT: Discussed case with hospitalist Dr. Olevia Bowens who accepts admission.   Final Clinical Impressions(s) / ED Diagnoses   Final diagnoses:  Palpitations    ED Discharge Orders    None       Leafy Kindle 01/01/19 2211    Blanchie Dessert, MD 01/02/19 1715

## 2019-01-01 NOTE — ED Notes (Signed)
Pt ambulated with portable Pulse Ox. Pt on room air was 92%. Pt did have moments where sats would drop into high 80's during which time pt notes some dizziness.

## 2019-01-01 NOTE — H&P (Signed)
History and Physical    Haley [name] PXT:[id number] DOB: 02-Jul-1966 DOA: 01/01/2019  PCP: Patient, No Pcp Per   Patient coming from:  Home.  I have personally briefly reviewed patient's old medical records in [place]  Chief Complaint: Heart palpitations.  HPI: Haley [name] is a 53 y.o. female with medical history significant of essential hypertension, fibroids, hyperthyroidism treated with Tapazole, morbid obesity, paroxysmal atrial fibrillation, obstructive sleep apnea, type 2 diabetes who is coming to the emergency department with complaints of having palpitations after taking DayQuil earlier in the day.  She drinks decaffeinated coffee and normally does not drink caffeinated drinks.  She has not used her metoprolol in the past 2 months.  She has been feeling weak for the past 2 weeks, which she attributes to not using her CPAP mask at night due to equipment issues.  While in the ER, patient had a brief episode of hypoxia with palpitations and chest pressure, but denies nausea, emesis, diaphoresis.  She denies fever, chills, sore throat, hemoptysis, PND, orthopnea, but occasionally has lower extremity edema.  No abdominal pain, nausea, emesis, diarrhea or constipation, melena or hematochezia.  No dysuria, frequency or hematuria.  No polyuria, polydipsia or blurred vision.  Denies skin rashes or pruritus.  ED Course: Initial vital signs temperature 99.2 F, pulse 97, respirations 16, blood pressure 150/73 mmHg and O2 sat 94% on room air.  I ordered K. Dur and 2 g of magnesium sulfate IVPB in the emergency department.  Her CBC was normal.  I-STAT troponin x2 has been negative.  BMP d-dimer and i-STAT hCG are within normal limits.  Her BMP shows a glucose of 203 mg/dL, but is otherwise unremarkable.  Her chest radiograph showed mild vascular congestion with some interstitial edema.  Review of Systems: As per HPI otherwise 10 point review of systems negative.   Past Medical  History:  Diagnosis Date  . Essential hypertension   . Fibroids   . Hyperthyroidism    a. 2010, treated w/ tapazole.  . Morbid obesity ([place])   . Paroxysmal atrial fibrillation ([place])    a. 03/2009 - in setting of hyperthyroidism and caffeine intake, converted spontaneously;  b. 08/2016 ED visit for recurrent PAF-->successful DCCV in ED.  [name] Snores   . Type II diabetes mellitus ([place])     Past Surgical History:  Procedure Laterality Date  . UTERINE FIBROID SURGERY       reports that she quit smoking about 8 years ago. She has a 32.00 pack-year smoking history. She has never used smokeless tobacco. She reports that she does not drink alcohol or use drugs.  No Known Allergies  Family medical history. Other: Essential hypertension.  Prior to Admission medications   Medication Sig Start Date End Date Taking? Authorizing Provider  apixaban (ELIQUIS) 5 MG TABS tablet Take 1 tablet (5 mg total) by mouth 2 (two) times daily. 06/26/18  Yes Malvin Johns, MD  glipiZIDE (GLUCOTROL) 10 MG tablet Take 1 tablet (10 mg total) by mouth daily before breakfast. 10/05/18  Yes Charlesetta Shanks, MD  metFORMIN (GLUCOPHAGE) 1000 MG tablet Take 1 tablet (1,000 mg total) by mouth 2 (two) times daily with a meal. 10/05/18  Yes Pfeiffer, Jeannie Done, MD  metoprolol tartrate (LOPRESSOR) 25 MG tablet Take 1 tablet (25 mg total) by mouth daily. 10/05/18  Yes Charlesetta Shanks, MD  Multiple Vitamins-Minerals (MULTIVITAMIN ADULTS) TABS Take 1 tablet by mouth daily.   Yes [provider]  naproxen sodium (ALEVE) 220 MG tablet  Take 440 mg by mouth daily as needed (pain).   Yes [provider]  OVER THE COUNTER MEDICATION Take 2 tablets by mouth daily as needed (PAIN). Back and Body Pain Reliever   Yes [provider]    Physical Exam: Vitals:   01/01/19 1800 01/01/19 1824 01/01/19 1900 01/01/19 1930  BP: (!) 143/72 128/77 133/87 132/79  Pulse: 85 86 88 83  Resp: 16 (!) 21 (!) 21   Temp:        TempSrc:      SpO2: 93% (!) 89% 100% 94%  Weight:      Height:        Constitutional: NAD, calm, comfortable Eyes: PERRL, lids and conjunctivae normal ENMT: Mucous membranes are moist. Posterior pharynx clear of any exudate or lesions. Neck: normal, supple, no masses, no thyromegaly Respiratory: clear to auscultation bilaterally, no wheezing, no crackles. Normal respiratory effort. No accessory muscle use.  Cardiovascular: Regular rate and rhythm, frequent extrasystoles, no murmurs / rubs / gallops.  1+ left lower extremity pitting edema. 2+ pedal pulses. No carotid bruits.  Abdomen: Obese, soft, no tenderness, no masses palpated. No hepatosplenomegaly. Bowel sounds positive.  Musculoskeletal: no clubbing / cyanosis. No joint deformity upper and lower extremities. Good ROM, no contractures. Normal muscle tone.  Skin: no rashes, lesions, ulcers. No induration Neurologic: CN 2-12 grossly intact. Sensation intact, DTR normal. Strength 5/5 in all 4.  Psychiatric: Normal judgment and insight. Alert and oriented x 3. Normal mood.   Labs on Admission: I have personally reviewed following labs and imaging studies  CBC: Recent Labs  Lab 01/01/19 1326  WBC 5.9  HGB 13.5  HCT 43.9  MCV 99.1  PLT 024   Basic Metabolic Panel: Recent Labs  Lab 01/01/19 1326  NA 137  K 3.6  CL 100  CO2 28  GLUCOSE 203*  BUN 9  CREATININE 0.53  CALCIUM 9.0   GFR: Estimated Creatinine Clearance: 114 mL/min (by C-G formula based on SCr of 0.53 mg/dL). Liver Function Tests: No results for input(s): AST, ALT, ALKPHOS, BILITOT, PROT, ALBUMIN in the last 168 hours. No results for input(s): LIPASE, AMYLASE in the last 168 hours. No results for input(s): AMMONIA in the last 168 hours. Coagulation Profile: No results for input(s): INR, PROTIME in the last 168 hours. Cardiac Enzymes: No results for input(s): CKTOTAL, CKMB, CKMBINDEX, TROPONINI in the last 168 hours. BNP (last 3 results) No results for  input(s): PROBNP in the last 8760 hours. HbA1C: No results for input(s): HGBA1C in the last 72 hours. CBG: No results for input(s): GLUCAP in the last 168 hours. Lipid Profile: No results for input(s): CHOL, HDL, LDLCALC, TRIG, CHOLHDL, LDLDIRECT in the last 72 hours. Thyroid Function Tests: No results for input(s): TSH, T4TOTAL, FREET4, T3FREE, THYROIDAB in the last 72 hours. Anemia Panel: No results for input(s): VITAMINB12, FOLATE, FERRITIN, TIBC, IRON, RETICCTPCT in the last 72 hours. Urine analysis:    Component Value Date/Time   COLORURINE STRAW (A) 10/05/2018 1353   APPEARANCEUR CLEAR 10/05/2018 1353   LABSPEC 1.029 10/05/2018 1353   PHURINE 5.0 10/05/2018 1353   GLUCOSEU >=500 (A) 10/05/2018 1353   HGBUR NEGATIVE 10/05/2018 1353   BILIRUBINUR NEGATIVE 10/05/2018 1353   KETONESUR 5 (A) 10/05/2018 1353   PROTEINUR NEGATIVE 10/05/2018 1353   UROBILINOGEN 0.2 05/04/2009 1627   NITRITE NEGATIVE 10/05/2018 1353   LEUKOCYTESUR NEGATIVE 10/05/2018 1353    Radiological Exams on Admission: Dg Chest 2 View  Result Date: 01/01/2019 CLINICAL DATA:  Irregular heartbeat and chest pain, initial encounter EXAM: CHEST - 2 VIEW COMPARISON:  10/05/2018 FINDINGS: Cardiac shadows within normal limits. Mild vascular congestion is noted without interstitial edema. No sizable effusion or focal infiltrate is noted. Degenerative changes of lumbar spine are seen. IMPRESSION: Mild vascular congestion without interstitial edema. Electronically Signed   By: Inez Catalina M.D.   On: 01/01/2019 12:52   Vas [place] Lower Extremity Venous (dvt) (mc And Wl 7a-7p)  Result Date: 01/01/2019  Lower Venous Study Indications: Swelling.  Performing Technologist: June Leap, I  Examination Guidelines: A complete evaluation includes B-mode imaging, spectral Doppler, color Doppler, and power Doppler as needed of all accessible portions of each vessel. Bilateral testing is considered an integral part of a complete  examination. Limited examinations for reoccurring indications may be performed as noted.  Left Venous Findings: +---------+---------------+---------+-----------+----------+-------+          CompressibilityPhasicitySpontaneityPropertiesSummary +---------+---------------+---------+-----------+----------+-------+ CFV      Full           Yes      Yes                          +---------+---------------+---------+-----------+----------+-------+ SFJ      Full                                                 +---------+---------------+---------+-----------+----------+-------+ FV Prox  Full                                                 +---------+---------------+---------+-----------+----------+-------+ FV Mid   Full                                                 +---------+---------------+---------+-----------+----------+-------+ FV DistalFull                                                 +---------+---------------+---------+-----------+----------+-------+ PFV      Full                                                 +---------+---------------+---------+-----------+----------+-------+ POP      Full           Yes      Yes                          +---------+---------------+---------+-----------+----------+-------+ PTV      Full                                                 +---------+---------------+---------+-----------+----------+-------+ PERO     Full                                                 +---------+---------------+---------+-----------+----------+-------+  Summary: Left: There is no evidence of deep vein thrombosis in the lower extremity. No cystic structure found in the popliteal fossa.  *See table(s) above for measurements and observations.    Preliminary     EKG: Independently reviewed.  Vent. rate 95 BPM PR interval * ms QRS duration 92 ms QT/QTc 332/418 ms P-R-T axes 71 81 33 Sinus rhythm.  Assessment/Plan Principal  Problem:   Chest tightness Observation/telemetry. Continue supplemental oxygen. Trend troponin level. Continue beta-blocker and anticoagulation. Check echocardiogram in a.m.  Active Problems:   Paroxysmal atrial fibrillation (HCC) CHA?DS?-VASc Score of at least 3. Continue Eliquis and metoprolol. Keep electrolytes optimized.    Essential hypertension Continue metoprolol 25 mg p.o. daily. Monitor all BP and HR.    Severe obstructive sleep apnea Resume CPAP while in the hospital.    Type II diabetes mellitus (HCC) Carbohydrate modified diet. Check hemoglobin A1c.    DVT prophylaxis: On Eliquis. Code Status: Full code. Family Communication: Disposition Plan: Observation for chest pain rule out. Consults called: Admission status: Observation/telemetry.   Reubin Milan MD [facility]  01/01/2019, 10:07 PM   This document was prepared using Dragon voice recognition software and may contain some unintended transcription errors.

## 2019-01-01 NOTE — ED Triage Notes (Signed)
Pt reports she had a cold and took some OTC medication and has been having heart palpitations since. Pt reports she took [place] Dayquil and has been having some irregular pulse. Pt reports she has been out of her metformin and glipizide for over a week. Pt reports she is diagnosed as "borderline diabetic"

## 2019-01-01 NOTE — Progress Notes (Signed)
LLE venous duplex       has been completed. Preliminary results can be found under CV proc through chart review. Genifer Lazenby, BS, RDMS, RVT    

## 2019-01-02 ENCOUNTER — Other Ambulatory Visit: Payer: Self-pay

## 2019-01-02 ENCOUNTER — Observation Stay (HOSPITAL_BASED_OUTPATIENT_CLINIC_OR_DEPARTMENT_OTHER): Payer: Self-pay

## 2019-01-02 DIAGNOSIS — I10 Essential (primary) hypertension: Secondary | ICD-10-CM

## 2019-01-02 DIAGNOSIS — I480 Paroxysmal atrial fibrillation: Secondary | ICD-10-CM

## 2019-01-02 DIAGNOSIS — G4733 Obstructive sleep apnea (adult) (pediatric): Secondary | ICD-10-CM

## 2019-01-02 LAB — HEMOGLOBIN A1C
Hgb A1c MFr Bld: 10.1 % — ABNORMAL HIGH (ref 4.8–5.6)
Mean Plasma Glucose: 243.17 mg/dL

## 2019-01-02 LAB — MAGNESIUM: Magnesium: 2 mg/dL (ref 1.7–2.4)

## 2019-01-02 LAB — PHOSPHORUS: Phosphorus: 3.7 mg/dL (ref 2.5–4.6)

## 2019-01-02 LAB — GLUCOSE, CAPILLARY
Glucose-Capillary: 161 mg/dL — ABNORMAL HIGH (ref 70–99)
Glucose-Capillary: 211 mg/dL — ABNORMAL HIGH (ref 70–99)

## 2019-01-02 LAB — TROPONIN I: Troponin I: <0.03 ng/mL (ref <0.03)

## 2019-01-02 LAB — ECHOCARDIOGRAM COMPLETE
Height: 67 in
Weight: 4480 oz

## 2019-01-02 LAB — HIV ANTIBODY (ROUTINE TESTING W REFLEX): HIV SCREEN 4TH GENERATION: NONREACTIVE

## 2019-01-02 LAB — TSH: TSH: 1.015 u[IU]/mL (ref 0.350–4.500)

## 2019-01-02 MED ORDER — APIXABAN 5 MG PO TABS
5.0000 mg | ORAL_TABLET | Freq: Two times a day (BID) | ORAL | Status: DC
  Administered 2019-01-02: 5 mg via ORAL
  Filled 2019-01-02: qty 1

## 2019-01-02 MED ORDER — ADULT MULTIVITAMIN W/MINERALS CH
1.0000 | ORAL_TABLET | Freq: Every day | ORAL | Status: DC
  Administered 2019-01-02: 1 via ORAL
  Filled 2019-01-02: qty 1

## 2019-01-02 MED ORDER — METFORMIN HCL 500 MG PO TABS
1000.0000 mg | ORAL_TABLET | Freq: Two times a day (BID) | ORAL | Status: DC
  Administered 2019-01-02: 1000 mg via ORAL
  Filled 2019-01-02: qty 2

## 2019-01-02 MED ORDER — METOPROLOL SUCCINATE ER 25 MG PO TB24: 25.0000 mg | ORAL_TABLET | Freq: Every day | ORAL | 0 refills | Status: DC

## 2019-01-02 MED ORDER — METOPROLOL TARTRATE 5 MG/5ML IV SOLN
5.0000 mg | Freq: Once | INTRAVENOUS | Status: DC
  Filled 2019-01-02: qty 5

## 2019-01-02 MED ORDER — ORAL CARE MOUTH RINSE
15.0000 mL | Freq: Two times a day (BID) | OROMUCOSAL | Status: DC
  Administered 2019-01-02: 15 mL via OROMUCOSAL

## 2019-01-02 MED ORDER — METOPROLOL SUCCINATE ER 25 MG PO TB24
25.0000 mg | ORAL_TABLET | Freq: Every day | ORAL | Status: DC
  Administered 2019-01-02: 25 mg via ORAL
  Filled 2019-01-02 (×2): qty 1

## 2019-01-02 MED ORDER — GLIPIZIDE 10 MG PO TABS
10.0000 mg | ORAL_TABLET | Freq: Every day | ORAL | Status: DC
  Administered 2019-01-02: 10 mg via ORAL
  Filled 2019-01-02: qty 1

## 2019-01-02 MED ORDER — INFLUENZA VAC SPLIT QUAD 0.5 ML IM SUSY
0.5000 mL | PREFILLED_SYRINGE | INTRAMUSCULAR | Status: AC
  Administered 2019-01-02: 0.5 mL via INTRAMUSCULAR
  Filled 2019-01-02: qty 0.5

## 2019-01-02 NOTE — Care Management Note (Signed)
Case Management Note  Patient Details  Name: Haley [name] MRN: [id number] Date of Birth: [birth year]  Subjective/Objective:   Patient provided w/pcp listing-encouraged [facility] clinics. Patient will get meds filled @ [facility] pharmacy. She can afford her ongoing meds that she was already on.Informed of MATCH program-1x use/12 calendar months/select pharmacies/$3 co pay for new meds. Patient declined [place] program. Pharmacy to address eliquis.               Action/Plan:d/c home.   Expected Discharge Date:                  Expected Discharge Plan:  Home/Self Care  In-House Referral:     Discharge planning Services  CM Consult  Post Acute Care Choice:    Choice offered to:     DME Arranged:    DME Agency:     HH Arranged:    [place] Agency:     Status of Service:  Completed, signed off  If discussed at [employer] of Stay Meetings, dates discussed:    Additional Comments:  Dessa Phi, RN 01/02/2019, 3:57 PM

## 2019-01-02 NOTE — Progress Notes (Signed)
Provided patient w/30day free trial offer eliquis soupon. Patient voiced understanding.

## 2019-01-02 NOTE — Discharge Summary (Signed)
Physician Discharge Summary  Haley [name] HAL:[id number] DOB: [birth year] DOA: 01/01/2019  PCP: Patient, No Pcp Per  Admit date: 01/01/2019 Discharge date: 01/02/2019  Admitted From: Home Disposition:  Home  Recommendations for Outpatient Follow-up:  1. Follow up with PCP in 1-2 weeks  Discharge Condition:Stable CODE STATUS:Full Diet recommendation: Diabetic   Brief/Interim Summary: 53 y.o. female with medical history significant of essential hypertension, fibroids, hyperthyroidism treated with Tapazole, morbid obesity, paroxysmal atrial fibrillation, obstructive sleep apnea, type 2 diabetes who is coming to the emergency department with complaints of having palpitations after taking DayQuil earlier in the day.  She drinks decaffeinated coffee and normally does not drink caffeinated drinks.  She has not used her metoprolol in the past 2 months.  She has been feeling weak for the past 2 weeks, which she attributes to not using her CPAP mask at night due to equipment issues.  While in the ER, patient had a brief episode of hypoxia with palpitations and chest pressure, but denies nausea, emesis, diaphoresis.  She denies fever, chills, sore throat, hemoptysis, PND, orthopnea, but occasionally has lower extremity edema.  No abdominal pain, nausea, emesis, diarrhea or constipation, melena or hematochezia.  No dysuria, frequency or hematuria.  No polyuria, polydipsia or blurred vision.  Denies skin rashes or pruritus.  ED Course: Initial vital signs temperature 99.2 F, pulse 97, respirations 16, blood pressure 150/73 mmHg and O2 sat 94% on room air.  I ordered K. Dur and 2 g of magnesium sulfate IVPB in the emergency department.  Her CBC was normal.  I-STAT troponin x2 has been negative.  BMP d-dimer and i-STAT hCG are within normal limits.  Her BMP shows a glucose of 203 mg/dL, but is otherwise unremarkable.  Her chest radiograph showed mild vascular congestion with some interstitial  edema.  Discharge Diagnoses:  Principal Problem:   Chest tightness Active Problems:   Paroxysmal atrial fibrillation (HCC)   Essential hypertension   Severe obstructive sleep apnea   Type II diabetes mellitus (HCC)  Principal Problem:   Chest tightness Resolved Serial troponin neg. Continued beta-blocker and anticoagulation. 2d echo reviewed with normal LVEF and no wall motion noted  Active Problems:   Paroxysmal atrial fibrillation (HCC) CHA?DS?-VASc Score of at least 3. Continued Eliquis and metoprolol. Keep electrolytes optimized.    Essential hypertension Continue metoprolol 25 mg p.o. daily. Remained stable    Severe obstructive sleep apnea Resumed CPAP while in the hospital.    Type II diabetes mellitus ([place]) Carbohydrate modified diet.    Discharge Instructions   Allergies as of 01/02/2019   No Known Allergies     Medication List    STOP taking these medications   metoprolol tartrate 25 MG tablet Commonly known as:  LOPRESSOR     TAKE these medications   apixaban 5 MG Tabs tablet Commonly known as:  ELIQUIS Take 1 tablet (5 mg total) by mouth 2 (two) times daily.   glipiZIDE 10 MG tablet Commonly known as:  GLUCOTROL Take 1 tablet (10 mg total) by mouth daily before breakfast.   metFORMIN 1000 MG tablet Commonly known as:  GLUCOPHAGE Take 1 tablet (1,000 mg total) by mouth 2 (two) times daily with a meal.   metoprolol succinate 25 MG 24 hr tablet Commonly known as:  TOPROL-XL Take 1 tablet (25 mg total) by mouth daily for 30 days. Start taking on:  January 03, 2019   MULTIVITAMIN ADULTS Tabs Take 1 tablet by mouth daily.   naproxen sodium 220 MG  tablet Commonly known as:  ALEVE Take 440 mg by mouth daily as needed (pain).   OVER THE COUNTER MEDICATION Take 2 tablets by mouth daily as needed (PAIN). Back and Body Pain Reliever      Follow-up Information    Follow up with your PCP. Schedule an appointment as soon as possible  for a visit in 1 week(s).          No Known Allergies    Procedures/Studies: Dg Chest 2 View  Result Date: 01/01/2019 CLINICAL DATA:  Irregular heartbeat and chest pain, initial encounter EXAM: CHEST - 2 VIEW COMPARISON:  10/05/2018 FINDINGS: Cardiac shadows within normal limits. Mild vascular congestion is noted without interstitial edema. No sizable effusion or focal infiltrate is noted. Degenerative changes of lumbar spine are seen. IMPRESSION: Mild vascular congestion without interstitial edema. Electronically Signed   By: Inez Catalina M.D.   On: 01/01/2019 12:52   Vas [place] Lower Extremity Venous (dvt) (mc And Wl 7a-7p)  Result Date: 01/02/2019  Lower Venous Study Indications: Swelling.  Performing Technologist: June Leap, I  Examination Guidelines: A complete evaluation includes B-mode imaging, spectral Doppler, color Doppler, and power Doppler as needed of all accessible portions of each vessel. Bilateral testing is considered an integral part of a complete examination. Limited examinations for reoccurring indications may be performed as noted.  Left Venous Findings: +---------+---------------+---------+-----------+----------+-------+          CompressibilityPhasicitySpontaneityPropertiesSummary +---------+---------------+---------+-----------+----------+-------+ CFV      Full           Yes      Yes                          +---------+---------------+---------+-----------+----------+-------+ SFJ      Full                                                 +---------+---------------+---------+-----------+----------+-------+ FV Prox  Full                                                 +---------+---------------+---------+-----------+----------+-------+ FV Mid   Full                                                 +---------+---------------+---------+-----------+----------+-------+ FV DistalFull                                                  +---------+---------------+---------+-----------+----------+-------+ PFV      Full                                                 +---------+---------------+---------+-----------+----------+-------+ POP      Full           Yes      Yes                          +---------+---------------+---------+-----------+----------+-------+  PTV      Full                                                 +---------+---------------+---------+-----------+----------+-------+ PERO     Full                                                 +---------+---------------+---------+-----------+----------+-------+    Summary: Left: There is no evidence of deep vein thrombosis in the lower extremity. No cystic structure found in the popliteal fossa.  *See table(s) above for measurements and observations. Electronically signed by Deitra Mayo MD on 01/02/2019 at 6:40:01 AM.    Final      Subjective: Eager to go home. Asymptomatic when walking the hallway  Discharge Exam: Vitals:   01/02/19 0350 01/02/19 0544  BP: 119/61 (!) 146/68  Pulse:  97  Resp:  16  Temp:  99.6 F (37.6 C)  SpO2:  96%   Vitals:   01/02/19 0134 01/02/19 0239 01/02/19 0350 01/02/19 0544  BP: 138/68 129/62 119/61 (!) 146/68  Pulse: 80 82  97  Resp: 18 16  16   Temp:  98.9 F (37.2 C)  99.6 F (37.6 C)  TempSrc:  Oral  Oral  SpO2: 95% 91%  96%  Weight:      Height:        General: Pt is alert, awake, not in acute distress Cardiovascular: RRR, S1/S2 +, no rubs, no gallops Respiratory: CTA bilaterally, no wheezing, no rhonchi Abdominal: Soft, NT, ND, bowel sounds + Extremities: no edema, no cyanosis   The results of significant diagnostics from this hospitalization (including imaging, microbiology, ancillary and laboratory) are listed below for reference.     Microbiology: No results found for this or any previous visit (from the past 240 hour(s)).   Labs: BNP (last 3 results) Recent Labs     01/01/19 1332  BNP 53.9   Basic Metabolic Panel: Recent Labs  Lab 01/01/19 1326 01/02/19 0243  NA 137  --   K 3.6  --   CL 100  --   CO2 28  --   GLUCOSE 203*  --   BUN 9  --   CREATININE 0.53  --   CALCIUM 9.0  --   MG  --  2.0  PHOS  --  3.7   Liver Function Tests: No results for input(s): AST, ALT, ALKPHOS, BILITOT, PROT, ALBUMIN in the last 168 hours. No results for input(s): LIPASE, AMYLASE in the last 168 hours. No results for input(s): AMMONIA in the last 168 hours. CBC: Recent Labs  Lab 01/01/19 1326  WBC 5.9  HGB 13.5  HCT 43.9  MCV 99.1  PLT 221   Cardiac Enzymes: Recent Labs  Lab 01/02/19 0243  TROPONINI <0.03   BNP: Invalid input(s): POCBNP CBG: Recent Labs  Lab 01/01/19 2339 01/02/19 0913 01/02/19 1155  GLUCAP 194* 161* 211*   D-Dimer Recent Labs    01/01/19 1834  DDIMER 0.34   Hgb A1c Recent Labs    01/02/19 0243  HGBA1C 10.1*   Lipid Profile No results for input(s): CHOL, HDL, LDLCALC, TRIG, CHOLHDL, LDLDIRECT in the last 72 hours. Thyroid function studies Recent Labs    01/02/19 0243  TSH 1.015   Anemia work up No results for input(s): VITAMINB12, FOLATE, FERRITIN, TIBC, IRON, RETICCTPCT in the last 72 hours. Urinalysis    Component Value Date/Time   COLORURINE STRAW (A) 10/05/2018 1353   APPEARANCEUR CLEAR 10/05/2018 1353   LABSPEC 1.029 10/05/2018 1353   PHURINE 5.0 10/05/2018 1353   GLUCOSEU >=500 (A) 10/05/2018 1353   HGBUR NEGATIVE 10/05/2018 1353   BILIRUBINUR NEGATIVE 10/05/2018 1353   KETONESUR 5 (A) 10/05/2018 1353   PROTEINUR NEGATIVE 10/05/2018 1353   UROBILINOGEN 0.2 05/04/2009 1627   NITRITE NEGATIVE 10/05/2018 1353   LEUKOCYTESUR NEGATIVE 10/05/2018 1353   Sepsis Labs Invalid input(s): PROCALCITONIN,  WBC,  LACTICIDVEN Microbiology No results found for this or any previous visit (from the past 240 hour(s)).  Time spent: 30 min  SIGNED:   Marylu Lund, MD  [facility] 01/02/2019,  4:16 PM  If 7PM-7AM, please contact night-coverage

## 2019-01-02 NOTE — Progress Notes (Signed)
Attempted to ambulate pt in hallway to assess O2 Sats. 93% and above on RA at rest. Pt then ambulated maybe 4 feet to doorway  And O2 Sats dropped to 86% RA. Pt stood still for a couple of minutes but stayed around 86% RA. Pt quickly recovered to 93% RA when sitting again. Will make MD aware.

## 2019-01-02 NOTE — Plan of Care (Signed)
Pt had a BM this shift. Pt denied pain.

## 2019-01-02 NOTE — Progress Notes (Signed)
  Echocardiogram 2D Echocardiogram has been performed.  Nejla Reasor L Androw 01/02/2019, 9:25 AM

## 2019-01-02 NOTE — ED Notes (Signed)
ED TO INPATIENT HANDOFF REPORT  Name/Age/Gender Haley [name] 53 y.o. female  Code Status    Code Status Orders  (From admission, onward)         Start     Ordered   01/01/19 2205  Full code  Continuous     01/01/19 2205        Code Status History    Date Active Date Inactive Code Status Order ID Comments User Context   01/01/2019 2204 01/01/2019 2205 Full Code [id number]  Reubin Milan, MD ED      Home/SNF/Other Home  Chief Complaint Dizziness; Throat pain; Headache  Level of Care/Admitting Diagnosis ED Disposition    ED Disposition Condition Comment   Admit  Hospital Area: [facility] [[id number]]  Level of Care: Telemetry [5]  Admit to tele based on following criteria: Monitor for Ischemic changes  Diagnosis: Chest tightness [215420]  Admitting Physician: Reubin Milan [[id number]]  Attending Physician: Reubin Milan [[id number]]  PT Class (Do Not Modify): Observation [104]  PT Acc Code (Do Not Modify): Observation [10022]       Medical History Past Medical History:  Diagnosis Date  . Essential hypertension   . Fibroids   . Hyperthyroidism    a. 2010, treated w/ tapazole.  . Morbid obesity ([place])   . Paroxysmal atrial fibrillation ([place])    a. 03/2009 - in setting of hyperthyroidism and caffeine intake, converted spontaneously;  b. 08/2016 ED visit for recurrent PAF-->successful DCCV in ED.  [name] Snores   . Type II diabetes mellitus ([place])     Allergies No Known Allergies  IV Location/Drains/Wounds Patient Lines/Drains/Airways Status   Active Line/Drains/Airways    Name:   Placement date:   Placement time:   Site:   Days:   Peripheral IV 01/01/19 Left Hand   01/01/19    2221    Hand   1          Labs/Imaging Results for orders placed or performed during the hospital encounter of 01/01/19 (from the past 48 hour(s))  Basic metabolic panel     Status: Abnormal   Collection Time: 01/01/19  1:26 PM  Result Value Ref  Range   Sodium 137 135 - 145 mmol/L   Potassium 3.6 3.5 - 5.1 mmol/L   Chloride 100 98 - 111 mmol/L   CO2 28 22 - 32 mmol/L   Glucose, Bld 203 (H) 70 - 99 mg/dL   BUN 9 6 - 20 mg/dL   Creatinine, Ser 0.53 0.44 - 1.00 mg/dL   Calcium 9.0 8.9 - 10.3 mg/dL   GFR calc non Af Amer >60 >60 mL/min   GFR calc Af Amer >60 >60 mL/min   Anion gap 9 5 - 15    Comment: Performed at [facility], [place] [address] [place], [place] [address]  CBC     Status: None   Collection Time: 01/01/19  1:26 PM  Result Value Ref Range   WBC 5.9 4.0 - 10.5 K/uL   RBC 4.43 3.87 - 5.11 MIL/uL   Hemoglobin 13.5 12.0 - 15.0 g/dL   HCT 43.9 36.0 - 46.0 %   MCV 99.1 80.0 - 100.0 fL   MCH 30.5 26.0 - 34.0 pg   MCHC 30.8 30.0 - 36.0 g/dL   RDW 12.5 11.5 - 15.5 %   Platelets 221 150 - 400 K/uL   nRBC 0.0 0.0 - 0.2 %    Comment: Performed at [facility],  [place] [address], [place], [place] [address]  I-Stat beta hCG blood, ED     Status: None   Collection Time: 01/01/19  1:32 PM  Result Value Ref Range   I-stat hCG, quantitative <5.0 <5 mIU/mL   Comment 3            Comment:   GEST. AGE      CONC.  (mIU/mL)   <=1 WEEK        5 - 50     2 WEEKS       50 - 500     3 WEEKS       100 - 10,000     4 WEEKS     1,000 - 30,000        FEMALE AND NON-PREGNANT FEMALE:     LESS THAN 5 mIU/mL   POCT i-Stat troponin I     Status: None   Collection Time: 01/01/19  1:32 PM  Result Value Ref Range   Troponin i, poc 0.00 0.00 - 0.08 ng/mL   Comment 3            Comment: Due to the release kinetics of cTnI, a negative result within the first hours of the onset of symptoms does not rule out myocardial infarction with certainty. If myocardial infarction is still suspected, repeat the test at appropriate intervals.   Brain natriuretic peptide     Status: None   Collection Time: 01/01/19  1:32 PM  Result Value Ref Range   B Natriuretic Peptide 23.6 0.0 - 100.0 pg/mL    Comment: Performed  at [facility], [place] [address] [place], [place] [address]  D-dimer, quantitative (not at [facility])     Status: None   Collection Time: 01/01/19  6:34 PM  Result Value Ref Range   D-Dimer, Quant 0.34 0.00 - 0.50 ug/mL-FEU    Comment: (NOTE) At the manufacturer cut-off of 0.50 ug/mL FEU, this assay has been documented to exclude PE with a sensitivity and negative predictive value of 97 to 99%.  At this time, this assay has not been approved by the FDA to exclude DVT/VTE. Results should be correlated with clinical presentation. Performed at [facility], [place] [address] [place], [place] [address]   POCT i-Stat troponin I     Status: None   Collection Time: 01/01/19  8:10 PM  Result Value Ref Range   Troponin i, poc 0.00 0.00 - 0.08 ng/mL   Comment 3            Comment: Due to the release kinetics of cTnI, a negative result within the first hours of the onset of symptoms does not rule out myocardial infarction with certainty. If myocardial infarction is still suspected, repeat the test at appropriate intervals.   Glucose, capillary     Status: Abnormal   Collection Time: 01/01/19 11:39 PM  Result Value Ref Range   Glucose-Capillary 194 (H) 70 - 99 mg/dL   Comment 1 Notify RN    Dg Chest 2 View  Result Date: 01/01/2019 CLINICAL DATA:  Irregular heartbeat and chest pain, initial encounter EXAM: CHEST - 2 VIEW COMPARISON:  10/05/2018 FINDINGS: Cardiac shadows within normal limits. Mild vascular congestion is noted without interstitial edema. No sizable effusion or focal infiltrate is noted. Degenerative changes of lumbar spine are seen. IMPRESSION: Mild vascular congestion without interstitial edema. Electronically Signed   By: Inez Catalina M.D.   On: 01/01/2019 12:52   Vas [place] Lower Extremity  Venous (dvt) (mc And Wl 7a-7p)  Result Date: 01/01/2019  Lower Venous Study Indications: Swelling.  Performing Technologist: June Leap, I  Examination  Guidelines: A complete evaluation includes B-mode imaging, spectral Doppler, color Doppler, and power Doppler as needed of all accessible portions of each vessel. Bilateral testing is considered an integral part of a complete examination. Limited examinations for reoccurring indications may be performed as noted.  Left Venous Findings: +---------+---------------+---------+-----------+----------+-------+          CompressibilityPhasicitySpontaneityPropertiesSummary +---------+---------------+---------+-----------+----------+-------+ CFV      Full           Yes      Yes                          +---------+---------------+---------+-----------+----------+-------+ SFJ      Full                                                 +---------+---------------+---------+-----------+----------+-------+ FV Prox  Full                                                 +---------+---------------+---------+-----------+----------+-------+ FV Mid   Full                                                 +---------+---------------+---------+-----------+----------+-------+ FV DistalFull                                                 +---------+---------------+---------+-----------+----------+-------+ PFV      Full                                                 +---------+---------------+---------+-----------+----------+-------+ POP      Full           Yes      Yes                          +---------+---------------+---------+-----------+----------+-------+ PTV      Full                                                 +---------+---------------+---------+-----------+----------+-------+ PERO     Full                                                 +---------+---------------+---------+-----------+----------+-------+    Summary: Left: There is no evidence of deep vein thrombosis in the lower extremity. No cystic structure found in the popliteal fossa.  *See table(s) above for  measurements and observations.  Preliminary     Pending Labs Unresulted Labs (From admission, onward)    Start     Ordered   01/02/19 0800  HIV antibody (Routine Testing)  Tomorrow morning,   R     01/01/19 2204   01/02/19 0200  Troponin I - Now Then Q6H  Now then every 6 hours,   R     01/01/19 2202   01/02/19 0200  Hemoglobin A1c  Once,   R     01/02/19 0043   01/01/19 2351  TSH  Add-on,   R     01/01/19 2350   01/01/19 2202  Magnesium  Add-on,   R     01/01/19 2201   01/01/19 2202  Phosphorus  Add-on,   R     01/01/19 2201          Vitals/Pain Today's Vitals   01/01/19 1930 01/01/19 2228 01/01/19 2230 01/01/19 2335  BP: 132/79 (!) 141/79 (!) 121/98 102/85  Pulse: 83 71 84 90  Resp:  20  19  Temp:      TempSrc:      SpO2: 94% 98% 99% 94%  Weight:      Height:        Isolation Precautions No active isolations  Medications Medications  sodium chloride flush (NS) 0.9 % injection 3 mL (3 mLs Intravenous Not Given 01/01/19 1812)  acetaminophen (TYLENOL) tablet 650 mg (has no administration in time range)  insulin aspart (novoLOG) injection 0-5 Units (0 Units Subcutaneous Not Given 01/01/19 2342)  insulin aspart (novoLOG) injection 0-20 Units (has no administration in time range)  0.45 % sodium chloride infusion ( Intravenous New Bag/Given 01/01/19 2342)  potassium chloride SA (K-DUR,KLOR-CON) CR tablet 40 mEq (40 mEq Oral Given 01/01/19 2222)  magnesium sulfate IVPB 2 g 50 mL (0 g Intravenous Stopped 01/01/19 2337)    Mobility walks

## 2019-01-24 ENCOUNTER — Emergency Department (HOSPITAL_COMMUNITY): Payer: Self-pay

## 2019-01-24 ENCOUNTER — Other Ambulatory Visit: Payer: Self-pay

## 2019-01-24 ENCOUNTER — Inpatient Hospital Stay: Payer: Self-pay | Admitting: Family Medicine

## 2019-01-24 ENCOUNTER — Observation Stay (HOSPITAL_COMMUNITY)
Admission: EM | Admit: 2019-01-24 | Discharge: 2019-01-25 | Disposition: A | Discharge status: Home / Self Care | Payer: Self-pay | Attending: Internal Medicine | Admitting: Internal Medicine

## 2019-01-24 ENCOUNTER — Encounter (HOSPITAL_COMMUNITY): Payer: Self-pay | Admitting: Internal Medicine

## 2019-01-24 ENCOUNTER — Ambulatory Visit
Admission: EM | Admit: 2019-01-24 | Discharge: 2019-01-24 | Disposition: A | Discharge status: Home / Self Care | Payer: Self-pay | Attending: Family Medicine | Admitting: Family Medicine

## 2019-01-24 ENCOUNTER — Encounter: Payer: Self-pay | Admitting: Emergency Medicine

## 2019-01-24 DIAGNOSIS — T471X5A Adverse effect of other antacids and anti-gastric-secretion drugs, initial encounter: Secondary | ICD-10-CM

## 2019-01-24 DIAGNOSIS — E119 Type 2 diabetes mellitus without complications: Secondary | ICD-10-CM

## 2019-01-24 DIAGNOSIS — R0902 Hypoxemia: Secondary | ICD-10-CM | POA: Insufficient documentation

## 2019-01-24 DIAGNOSIS — Z6841 Body Mass Index (BMI) 40.0 and over, adult: Secondary | ICD-10-CM | POA: Insufficient documentation

## 2019-01-24 DIAGNOSIS — R0602 Shortness of breath: Secondary | ICD-10-CM | POA: Insufficient documentation

## 2019-01-24 DIAGNOSIS — R079 Chest pain, unspecified: Secondary | ICD-10-CM | POA: Diagnosis present

## 2019-01-24 DIAGNOSIS — Z79899 Other long term (current) drug therapy: Secondary | ICD-10-CM | POA: Insufficient documentation

## 2019-01-24 DIAGNOSIS — I471 Supraventricular tachycardia: Secondary | ICD-10-CM | POA: Insufficient documentation

## 2019-01-24 DIAGNOSIS — E66813 Obesity, class 3: Secondary | ICD-10-CM

## 2019-01-24 DIAGNOSIS — E1165 Type 2 diabetes mellitus with hyperglycemia: Secondary | ICD-10-CM | POA: Insufficient documentation

## 2019-01-24 DIAGNOSIS — R0609 Other forms of dyspnea: Secondary | ICD-10-CM | POA: Diagnosis present

## 2019-01-24 DIAGNOSIS — Z791 Long term (current) use of non-steroidal anti-inflammatories (NSAID): Secondary | ICD-10-CM | POA: Insufficient documentation

## 2019-01-24 DIAGNOSIS — R9431 Abnormal electrocardiogram [ECG] [EKG]: Secondary | ICD-10-CM | POA: Insufficient documentation

## 2019-01-24 DIAGNOSIS — Z7982 Long term (current) use of aspirin: Secondary | ICD-10-CM | POA: Insufficient documentation

## 2019-01-24 DIAGNOSIS — R0789 Other chest pain: Principal | ICD-10-CM | POA: Insufficient documentation

## 2019-01-24 DIAGNOSIS — Z87891 Personal history of nicotine dependence: Secondary | ICD-10-CM | POA: Insufficient documentation

## 2019-01-24 DIAGNOSIS — E0500 Thyrotoxicosis with diffuse goiter without thyrotoxic crisis or storm: Secondary | ICD-10-CM | POA: Insufficient documentation

## 2019-01-24 DIAGNOSIS — Z9114 Patient's other noncompliance with medication regimen: Secondary | ICD-10-CM | POA: Insufficient documentation

## 2019-01-24 DIAGNOSIS — I10 Essential (primary) hypertension: Secondary | ICD-10-CM | POA: Diagnosis present

## 2019-01-24 DIAGNOSIS — X58XXXA Exposure to other specified factors, initial encounter: Secondary | ICD-10-CM | POA: Insufficient documentation

## 2019-01-24 DIAGNOSIS — Z8249 Family history of ischemic heart disease and other diseases of the circulatory system: Secondary | ICD-10-CM | POA: Insufficient documentation

## 2019-01-24 DIAGNOSIS — Z9119 Patient's noncompliance with other medical treatment and regimen: Secondary | ICD-10-CM | POA: Insufficient documentation

## 2019-01-24 DIAGNOSIS — G4733 Obstructive sleep apnea (adult) (pediatric): Secondary | ICD-10-CM | POA: Insufficient documentation

## 2019-01-24 DIAGNOSIS — Z5181 Encounter for therapeutic drug level monitoring: Secondary | ICD-10-CM

## 2019-01-24 DIAGNOSIS — I480 Paroxysmal atrial fibrillation: Secondary | ICD-10-CM | POA: Diagnosis present

## 2019-01-24 DIAGNOSIS — Z7901 Long term (current) use of anticoagulants: Secondary | ICD-10-CM | POA: Insufficient documentation

## 2019-01-24 DIAGNOSIS — R0601 Orthopnea: Secondary | ICD-10-CM

## 2019-01-24 DIAGNOSIS — E785 Hyperlipidemia, unspecified: Secondary | ICD-10-CM | POA: Insufficient documentation

## 2019-01-24 DIAGNOSIS — Z7984 Long term (current) use of oral hypoglycemic drugs: Secondary | ICD-10-CM | POA: Insufficient documentation

## 2019-01-24 DIAGNOSIS — E049 Nontoxic goiter, unspecified: Secondary | ICD-10-CM

## 2019-01-24 DIAGNOSIS — Z833 Family history of diabetes mellitus: Secondary | ICD-10-CM | POA: Insufficient documentation

## 2019-01-24 DIAGNOSIS — G4730 Sleep apnea, unspecified: Secondary | ICD-10-CM | POA: Diagnosis present

## 2019-01-24 LAB — CBC
HCT: 45 % (ref 36.0–46.0)
Hemoglobin: 14.1 g/dL (ref 12.0–15.0)
MCH: 29.9 pg (ref 26.0–34.0)
MCHC: 31.3 g/dL (ref 30.0–36.0)
MCV: 95.5 fL (ref 80.0–100.0)
PLATELETS: 262 10*3/uL (ref 150–400)
RBC: 4.71 MIL/uL (ref 3.87–5.11)
RDW: 12.3 % (ref 11.5–15.5)
WBC: 8.1 10*3/uL (ref 4.0–10.5)
nRBC: 0 % (ref 0.0–0.2)

## 2019-01-24 LAB — BASIC METABOLIC PANEL
Anion gap: 10 (ref 5–15)
BUN: 9 mg/dL (ref 6–20)
CALCIUM: 9.6 mg/dL (ref 8.9–10.3)
CO2: 28 mmol/L (ref 22–32)
Chloride: 99 mmol/L (ref 98–111)
Creatinine, Ser: 0.5 mg/dL (ref 0.44–1.00)
GFR calc Af Amer: >60 mL/min (ref >60)
GFR calc non Af Amer: >60 mL/min (ref >60)
Glucose, Bld: 293 mg/dL — ABNORMAL HIGH (ref 70–99)
Potassium: 4 mmol/L (ref 3.5–5.1)
SODIUM: 137 mmol/L (ref 135–145)

## 2019-01-24 LAB — TROPONIN I: Troponin I: <0.03 ng/mL (ref <0.03)

## 2019-01-24 LAB — BRAIN NATRIURETIC PEPTIDE: B Natriuretic Peptide: 16.7 pg/mL (ref 0.0–100.0)

## 2019-01-24 LAB — GLUCOSE, CAPILLARY: GLUCOSE-CAPILLARY: 206 mg/dL — AB (ref 70–99)

## 2019-01-24 LAB — MAGNESIUM: Magnesium: 1.5 mg/dL — ABNORMAL LOW (ref 1.7–2.4)

## 2019-01-24 LAB — I-STAT TROPONIN, ED: TROPONIN I, POC: 0 ng/mL (ref 0.00–0.08)

## 2019-01-24 LAB — TSH: TSH: 1.624 u[IU]/mL (ref 0.350–4.500)

## 2019-01-24 LAB — I-STAT BETA HCG BLOOD, ED (MC, WL, AP ONLY): I-stat hCG, quantitative: <5 m[IU]/mL (ref <5)

## 2019-01-24 MED ORDER — METOPROLOL SUCCINATE ER 25 MG PO TB24
25.0000 mg | ORAL_TABLET | Freq: Every day | ORAL | Status: DC
  Administered 2019-01-25: 25 mg via ORAL
  Filled 2019-01-24: qty 1

## 2019-01-24 MED ORDER — ONDANSETRON HCL 4 MG/2ML IJ SOLN: 4.0000 mg | Freq: Four times a day (QID) | INTRAMUSCULAR | Status: DC | PRN

## 2019-01-24 MED ORDER — ACETAMINOPHEN 325 MG PO TABS: 650.0000 mg | ORAL_TABLET | Freq: Four times a day (QID) | ORAL | Status: DC | PRN

## 2019-01-24 MED ORDER — IOPAMIDOL (ISOVUE-370) INJECTION 76%
100.0000 mL | Freq: Once | INTRAVENOUS | Status: AC | PRN
  Administered 2019-01-24: 100 mL via INTRAVENOUS

## 2019-01-24 MED ORDER — MAGNESIUM SULFATE 2 GM/50ML IV SOLN
2.0000 g | Freq: Once | INTRAVENOUS | Status: AC
  Administered 2019-01-24: 2 g via INTRAVENOUS
  Filled 2019-01-24: qty 50

## 2019-01-24 MED ORDER — ONDANSETRON HCL 4 MG PO TABS: 4.0000 mg | ORAL_TABLET | Freq: Four times a day (QID) | ORAL | Status: DC | PRN

## 2019-01-24 MED ORDER — IOPAMIDOL (ISOVUE-370) INJECTION 76%
INTRAVENOUS | Status: AC
  Filled 2019-01-24: qty 100

## 2019-01-24 MED ORDER — INSULIN ASPART 100 UNIT/ML ~~LOC~~ SOLN
0.0000 [IU] | Freq: Three times a day (TID) | SUBCUTANEOUS | Status: DC
  Administered 2019-01-25 (×3): 3 [IU] via SUBCUTANEOUS

## 2019-01-24 MED ORDER — ASPIRIN EC 81 MG PO TBEC
81.0000 mg | DELAYED_RELEASE_TABLET | ORAL | Status: DC
  Administered 2019-01-25: 81 mg via ORAL
  Filled 2019-01-24: qty 1

## 2019-01-24 MED ORDER — ACETAMINOPHEN 650 MG RE SUPP: 650.0000 mg | Freq: Four times a day (QID) | RECTAL | Status: DC | PRN

## 2019-01-24 MED ORDER — APIXABAN 5 MG PO TABS
5.0000 mg | ORAL_TABLET | Freq: Two times a day (BID) | ORAL | Status: DC
  Administered 2019-01-24 – 2019-01-25 (×2): 5 mg via ORAL
  Filled 2019-01-24 (×2): qty 1

## 2019-01-24 MED ORDER — FUROSEMIDE 10 MG/ML IJ SOLN
20.0000 mg | Freq: Once | INTRAMUSCULAR | Status: AC
  Administered 2019-01-24: 20 mg via INTRAVENOUS
  Filled 2019-01-24: qty 2

## 2019-01-24 NOTE — ED Triage Notes (Signed)
Pt presents to [facility] for assessment of continuing shortness of breath after an ER visit on 1/31.  Patient states the SOB has not resolved, and she does not have a PCP to follow up with.  O2 sats at 88%.

## 2019-01-24 NOTE — ED Notes (Signed)
Provider at [facility]

## 2019-01-24 NOTE — ED Provider Notes (Signed)
[facility] EMERGENCY DEPARTMENT Provider Note   CSN: [id number] Arrival date & time: 01/24/19  1539    History   Chief Complaint Chief Complaint  Patient presents with  . Chest Pain    HPI Haley [name] is a 53 y.o. female who presents the emergency department chief complaint of exertional dyspnea and chest pain.  Past medical history of hyperthyroidism, morbid obesity, paroxysmal atrial fibrillation, hypertension, OSA, and type 2 diabetes.  The patient was admitted at the end of January for hypoxia, and dyspnea.  She had a left lower extremity DVT study that was negative, and an echocardiogram that showed an EF of 60 to 65%.  Patient dates that prior to her admission she had run out of her metoprolol and had been not taking her medications.  The patient states that since she has been discharged she has had worsening exertional dyspnea, left-sided chest pain that is sharp, orthopnea.  She has been noncompliant with her CPAP because she is currently uninsured and states that it has a foul odor and she has been unable to clean it appropriately and is afraid to use it.  The patient also states that she is only been taking her Eliquis once daily because she knows she will run out and she is been trying to ration her medication.  She denies a history of DVT or PE.  She does get intermittent left calf swelling which resolves when she elevates her leg.  She denies hemoptysis, use of exogenous estrogens, recent trauma, confinement or surgery.  She denies fevers chills or cough.  She was seen at the urgent care prior to her ER visit and noted to be hypoxic to 88% on room air.     HPI  Past Medical History:  Diagnosis Date  . Essential hypertension   . Fibroids   . Hyperthyroidism    a. 2010, treated w/ tapazole.  . Morbid obesity ([place])   . Paroxysmal atrial fibrillation ([place])    a. 03/2009 - in setting of hyperthyroidism and caffeine intake, converted spontaneously;  b.  08/2016 ED visit for recurrent PAF-->successful DCCV in ED.  [name] Snores   . Type II diabetes mellitus [facility])     Patient Active Problem List   Diagnosis Date Noted  . Chest tightness 01/01/2019  . Type II diabetes mellitus ([place]) 01/01/2019  . Severe obstructive sleep apnea 01/14/2017  . Hypoxemia 01/14/2017  . Paroxysmal atrial fibrillation ([facility])   . Essential hypertension     Past Surgical History:  Procedure Laterality Date  . UTERINE FIBROID SURGERY       OB History   No obstetric history on file.      Home Medications    Prior to Admission medications   Medication Sig Start Date End Date Taking? Authorizing Provider  apixaban (ELIQUIS) 5 MG TABS tablet Take 1 tablet (5 mg total) by mouth 2 (two) times daily. 06/26/18   Malvin Johns, MD  glipiZIDE (GLUCOTROL) 10 MG tablet Take 1 tablet (10 mg total) by mouth daily before breakfast. 10/05/18   Charlesetta Shanks, MD  metFORMIN (GLUCOPHAGE) 1000 MG tablet Take 1 tablet (1,000 mg total) by mouth 2 (two) times daily with a meal. 10/05/18   Charlesetta Shanks, MD  metoprolol succinate (TOPROL-XL) 25 MG 24 hr tablet Take 1 tablet (25 mg total) by mouth daily for 30 days. 01/03/19 02/02/19  Donne Hazel, MD  Multiple Vitamins-Minerals (MULTIVITAMIN ADULTS) TABS Take 1 tablet by mouth daily.    [provider]  naproxen sodium (ALEVE) 220 MG tablet Take 440 mg by mouth daily as needed (pain).    [provider]  OVER THE COUNTER MEDICATION Take 2 tablets by mouth daily as needed (PAIN). Back and Body Pain Reliever    [provider]    Family History No family history on file.  Social History Social History   Tobacco Use  . Smoking status: Former Smoker    Packs/day: 1.00    Years: 32.00    Pack years: 32.00    Last attempt to quit: 2012    Years since quitting: 8.1  . Smokeless tobacco: Never Used  . Tobacco comment: patient vapes  Substance Use Topics  . Alcohol use: No    Comment: occasional  drink.  . Drug use: No     Allergies   Patient has no known allergies.   Review of Systems Review of Systems  Ten systems reviewed and are negative for acute change, except as noted in the HPI.    Physical Exam Updated Vital Signs BP (!) 118/55   Pulse 93   Temp 97.9 F (36.6 C) (Oral)   Resp 19   Ht 5\' 7"  (1.702 m)   Wt 128.4 kg   SpO2 93%   BMI 44.32 kg/m   Physical Exam Vitals signs and nursing note reviewed.  Constitutional:      General: She is not in acute distress.    Appearance: She is well-developed. She is not diaphoretic.  HENT:     Head: Normocephalic and atraumatic.  Eyes:     General: No scleral icterus.    Conjunctiva/sclera: Conjunctivae normal.  Neck:     Musculoskeletal: Normal range of motion.  Cardiovascular:     Rate and Rhythm: Normal rate and regular rhythm.     Heart sounds: Normal heart sounds. No murmur. No friction rub. No gallop.   Pulmonary:     Effort: Pulmonary effort is normal. No respiratory distress.     Breath sounds: Normal breath sounds.  Chest:     Chest wall: Tenderness present.    Abdominal:     General: Bowel sounds are normal. There is no distension.     Palpations: Abdomen is soft. There is no mass.     Tenderness: There is no abdominal tenderness. There is no guarding.  Musculoskeletal:     Right lower leg: No edema.     Left lower leg: No edema.  Skin:    General: Skin is warm and dry.  Neurological:     Mental Status: She is alert and oriented to person, place, and time.  Psychiatric:        Behavior: Behavior normal.      ED Treatments / Results  Labs (all labs ordered are listed, but only abnormal results are displayed) Labs Reviewed  BASIC METABOLIC PANEL - Abnormal; Notable for the following components:      Result Value   Glucose, Bld 293 (*)    All other components within normal limits  CBC  TSH  T3  T4  BRAIN NATRIURETIC PEPTIDE  I-STAT TROPONIN, ED  I-STAT BETA HCG BLOOD, ED ([facility], [facility],  AP ONLY)    EKG None ED ECG REPORT   Date: 01/24/2019  Rate: 104  Rhythm: sinus tachycardia  QRS Axis: normal  Intervals: normal  ST/T Wave abnormalities: NEW T WAVE INVERSIOINS IN THE LATTERAL LEADS  Conduction Disutrbances:none  Narrative Interpretation:   Old EKG Reviewed: changes noted  Radiology Dg Chest 2 View  Result Date: 01/24/2019 CLINICAL DATA:  Left-sided chest pain and shortness of breath. EXAM: CHEST - 2 VIEW COMPARISON:  01/01/2019 FINDINGS: Cardiomediastinal silhouette is normal. Mediastinal contours appear intact. There is no evidence of focal airspace consolidation, pleural effusion or pneumothorax. Possible mild pulmonary vascular congestion. Osseous structures are without acute abnormality. Soft tissues are grossly normal. IMPRESSION: Possible mild pulmonary vascular congestion. Electronically Signed   By: Fidela Salisbury M.D.   On: 01/24/2019 16:27    Procedures Procedures (including critical care time)  Medications Ordered in ED Medications  iopamidol (ISOVUE-370) 76 % injection (has no administration in time range)     Initial Impression / Assessment and Plan / ED Course  I have reviewed the triage vital signs and the nursing notes.  Pertinent labs & imaging results that were available during my care of the patient were reviewed by me and considered in my medical decision making (see chart for details).  Clinical Course as of Jan 24 2306  Thu Jan 24, 2019  2050 Magnesium(!): 1.5 [AH]    Clinical Course User Index [AH] Margarita Mail, PA-C  CHA2DS2/VAS Stroke Risk Points  Current as of just now     3 >= 2 Points: High Risk  1 - 1.99 Points: Medium Risk  0 Points: Low Risk    This is the only CHA2DS2/VAS Stroke Risk Points available for the past  year.:  Last Change: N/A     Details    This score determines the patient's risk of having a stroke if the  patient has atrial fibrillation.       Points Metrics  0 Has Congestive Heart  Failure:  No    Current as of just now  0 Has Vascular Disease:  No    Current as of just now  1 Has Hypertension:  Yes    Current as of just now  0 Age:  57    Current as of just now  1 Has Diabetes:  Yes    Current as of just now  0 Had Stroke:  No  Had TIA:  No  Had thromboembolism:  No    Current as of just now  1 Female:  Yes    Current as of just now             53 y/o f with worsening exertional dsympnea, orthopnea, chest pain. She has been taking her antihypertensive medicaitons since her previous admission, however she has not been taking her glipizied or metformin and has only been taking her eliquis once a day.  Her chadsvasc is documented above. The ddx includes, ACS, CHF, PE, Hyperthyroid, cardiomyopathy. She appears to have some pulmonary vascular congestion on her 2 view chest xray which I personally reviewed. I obtained a CTA which shows enlarged pulmonary artery concerning for PULM HTN which may be the underlying cause of her sxs and would fit with her hx of sleep apnea. The patient notably has new T wave inversions, but has no active CP. These sxs may be anginal, however I have low suspicion for this. CT Shows a large goiter, but TSH appears WNL . T3/T4 still pending. Given her sxs I feel she may need IP work up and have called the Hospitalist for consult.    Final Clinical Impressions(s) / ED Diagnoses   Final diagnoses:  Exertional dyspnea  Hypoxia  Goiter  Orthopnea  T wave inversion in EKG  Subtherapeutic anticoagulation    ED Discharge  Orders    None       Margarita Mail, PA-C 01/24/19 2307    Isla Pence, MD 01/24/19 [phone]

## 2019-01-24 NOTE — ED Notes (Signed)
Patient transported to CT 

## 2019-01-24 NOTE — ED Triage Notes (Signed)
Pt arrives POV from UC for eval of worsening CP w/ movement/exertion and SOB. Pt reports SOB and CP since 1/30 discharge from here. Pt went to UC today and was sent here for further eval. Pt denies N/V.

## 2019-01-24 NOTE — ED Provider Notes (Signed)
[facility]   [id number] 01/24/19 Arrival Time: 7829   CC: CHEST discomfort and SOB  SUBJECTIVE:  Haley [name] is a 53 y.o. female hx significant for atrial fibrillation, who presents with complaint of constant SOB and chest discomfort with exertion x 1 month.  Denies a precipitating event, trauma, recent lower respiratory tract, or strenuous upper body activities.  States symptoms began after running out of her crhonic medications.  Localizes chest pain to left side of chest.  Describes as stable, that is intermittent (with episodes lasting a few minutes at a time) and sharp in character.  Rates pain as 2/10.   Has tried taking her metoprolol without relief.  Symptoms made worse with exertion.  Denies radiating symptoms.  Reports previous symptoms in the past and was evaluated in the hospital.  Patient was instructed to take eliquis, metformin, metoprolol, and glipizide, but ran out of medications and has not established primary care.  Complains of associated lightheaded, dizziness, tachycardia, increased urination, itching bilateral palms, and left leg swelling.  Denies fever, chills, palpitations, nausea, vomiting, abdominal pain, changes in bowel or bladder habits, numbness/ tingling in extremities, anxiety.    Admits to bilateral calf cramping, and left calf swelling  Denies recent long travel, recent surgery, pregnancy, malignancy, tobacco use, hormone use, or previous blood clot  Denies close relatives with cardiac hx  ROS: As per HPI. Past Medical History:  Diagnosis Date  . Essential hypertension   . Fibroids   . Hyperthyroidism    a. 2010, treated w/ tapazole.  . Morbid obesity ([place])   . Paroxysmal atrial fibrillation ([place])    a. 03/2009 - in setting of hyperthyroidism and caffeine intake, converted spontaneously;  b. 08/2016 ED visit for recurrent PAF-->successful DCCV in ED.  [name] Snores   . Type II diabetes mellitus ([place])    Past Surgical History:  Procedure  Laterality Date  . UTERINE FIBROID SURGERY     No Known Allergies No current facility-administered medications on file prior to encounter.    Current Outpatient Medications on File Prior to Encounter  Medication Sig Dispense Refill  . apixaban (ELIQUIS) 5 MG TABS tablet Take 1 tablet (5 mg total) by mouth 2 (two) times daily. 60 tablet 0  . metFORMIN (GLUCOPHAGE) 1000 MG tablet Take 1 tablet (1,000 mg total) by mouth 2 (two) times daily with a meal. 60 tablet 1  . metoprolol succinate (TOPROL-XL) 25 MG 24 hr tablet Take 1 tablet (25 mg total) by mouth daily for 30 days. 30 tablet 0  . Multiple Vitamins-Minerals (MULTIVITAMIN ADULTS) TABS Take 1 tablet by mouth daily.    . naproxen sodium (ALEVE) 220 MG tablet Take 440 mg by mouth daily as needed (pain).    [name] glipiZIDE (GLUCOTROL) 10 MG tablet Take 1 tablet (10 mg total) by mouth daily before breakfast. 30 tablet 1  . OVER THE COUNTER MEDICATION Take 2 tablets by mouth daily as needed (PAIN). Back and Body Pain Reliever     Social History   Socioeconomic History  . Marital status: Married    Spouse name: Not on file  . Number of children: Not on file  . Years of education: Not on file  . Highest education level: Not on file  Occupational History  . Occupation: Theatre manager  Social Needs  . Financial resource strain: Not on file  . Food insecurity:    Worry: Not on file    Inability: Not on file  . Transportation needs:  Medical: Not on file    Non-medical: Not on file  Tobacco Use  . Smoking status: Former Smoker    Packs/day: 1.00    Years: 32.00    Pack years: 32.00    Last attempt to quit: 2012    Years since quitting: 8.1  . Smokeless tobacco: Never Used  . Tobacco comment: patient vapes  Substance and Sexual Activity  . Alcohol use: No    Comment: occasional drink.  . Drug use: No  . Sexual activity: Not on file  Lifestyle  . Physical activity:    Days per week: Not on file    Minutes per session: Not on file   . Stress: Not on file  Relationships  . Social connections:    Talks on phone: Not on file    Gets together: Not on file    Attends religious service: Not on file    Active member of club or organization: Not on file    Attends meetings of clubs or organizations: Not on file    Relationship status: Not on file  . Intimate partner violence:    Fear of current or ex partner: Not on file    Emotionally abused: Not on file    Physically abused: Not on file    Forced sexual activity: Not on file  Other Topics Concern  . Not on file  Social History Narrative   Lives in [place] with husband.  Systems analyst.  Also in school @ [employer] for [employer].  Does not routinely exercise.   History reviewed. No pertinent family history.   OBJECTIVE:  Vitals:   01/24/19 1353  BP: (!) 143/83  Pulse: (!) 102  Resp: (!) 22  Temp: 98.2 F (36.8 C)  TempSrc: Oral  SpO2: (!) 88%   O2 stats improved on 2 L of oxygen to 94-95%  General appearance: alert; appears fatigued during examination Eyes: PERRLA; EOMI; conjunctiva normal HENT: normocephalic; atraumatic Neck: supple Lungs: clear to auscultation bilaterally without adventitious breath sounds Heart: regular rate and rhythm.  Clear S1 and S2 without rubs, gallops, or murmur.  No carotid bruits Abdomen: soft, protuberant, non-tender; bowel sounds normal;  no guarding Extremities: no cyanosis or edema; symmetrical with no gross deformities Skin: warm and dry Pchological: alert and cooperative; normal mood and affect  ECG: Orders placed or performed during the hospital encounter of 01/24/19  . ED EKG  . ED EKG   EKG with possible ST depressions in lead III.  T wave abnormality V5 and V6  ASSESSMENT & PLAN:  1. Nonspecific abnormal electrocardiogram (ECG) (EKG)   2. Chest discomfort   3. Shortness of breath   4. Hypoxic    Recommending further evaluation and management in the ED for chest discomfort with exertion, worsening  shortness of breath x 1 month.  History significant for atrial fibrillation, hypertension, diabetes, and sleep apnea. Abnormal EKG.  Tachycardic, hypertensive, and hypoxic in office, improved on 2 L of oxygen.  Recommended EMS transport.  Patient declines EMS transport at this time.  Patient aware of risks including delayed treatment, injury or death associated with this decision.       Lestine Box, PA-C 01/24/19 1556

## 2019-01-24 NOTE — ED Notes (Signed)
Patient able to ambulate independently.  O2 saturations at 95% on 2L [place] prior to removal for discharge.  Patient was offered an EMS twice by this RN during discharge, patient continues to refuse.  Understands risks.

## 2019-01-24 NOTE — Discharge Instructions (Signed)
Recommending further evaluation and management in the ED for chest discomfort with exertion, worsening shortness of breath x 1 month.  History significant for atrial fibrillation, hypertension, diabetes, and sleep apnea. Abnormal EKG.  Tachycardic, hypertensive, and hypoxic in office, improved on 2 L of oxygen.  Recommended EMS transport.  Patient declines EMS transport at this time.  Patient aware of risks including delayed treatment, injury or death associated with this decision.

## 2019-01-24 NOTE — H&P (Signed)
History and Physical    [name] YBO:[id number] DOB: 1966-02-20 DOA: 01/24/2019  PCP: Patient, No Pcp Per  Patient coming from: Home.  Chief Complaint: Chest tightness and exertional dyspnea.  HPI: Haley [name] is a 53 y.o. female with history of paroxysmal atrial fibrillation, diabetes mellitus type 2, morbid obesity, sleep apnea who was just discharged 3 weeks ago after being admitted for chest tightness and palpitations and at that time 2D echo was largely unremarkable.  Patient also had intermittent swelling of the left lower extremity Dopplers done on January 01, 2019 was negative for DVT.  Patient was discharged home and has been noncompliant with this medication due to any financial issues.  Patient had followed up with her primary care physician today when patient was found to be hypoxic and was referred to the ER.  Patient states over the last few weeks patient has been having exertional dyspnea and tightness which improves with rest.  ED Course: In the ER patient was found to be hypoxic.  CT angiogram of the chest was negative for PE but does show features concerning for pulmonary hypertension.  In addition is found to be having goiter.  TSH was normal troponin was negative BNP was 16.  Patient is CAT scan also shows mosaic pattern concerning for small airway disease.  Patient was given 1 dose of Lasix 40 mg admitted for further observation.  Review of Systems: As per HPI, rest all negative.   Past Medical History:  Diagnosis Date  . Essential hypertension   . Fibroids   . Hyperthyroidism    a. 2010, treated w/ tapazole.  . Morbid obesity ([place])   . Paroxysmal atrial fibrillation ([place])    a. 03/2009 - in setting of hyperthyroidism and caffeine intake, converted spontaneously;  b. 08/2016 ED visit for recurrent PAF-->successful DCCV in ED.  [name] Snores   . Type II diabetes mellitus ([place])     Past Surgical History:  Procedure Laterality Date  . UTERINE FIBROID  SURGERY       reports that she quit smoking about 8 years ago. She has a 32.00 pack-year smoking history. She has never used smokeless tobacco. She reports that she does not drink alcohol or use drugs.  No Known Allergies  History reviewed. No pertinent family history.  Prior to Admission medications   Medication Sig Start Date End Date Taking? Authorizing Provider  acetaminophen (TYLENOL) 500 MG tablet Take 500 mg by mouth daily.   Yes [provider]  apixaban (ELIQUIS) 5 MG TABS tablet Take 1 tablet (5 mg total) by mouth 2 (two) times daily. 06/26/18  Yes Malvin Johns, MD  aspirin EC 81 MG tablet Take 81 mg by mouth 3 (three) times a week.   Yes [provider]  Aspirin-Caffeine (BACK & BODY EXTRA STRENGTH PO) Take 2 tablets by mouth daily as needed (for pain).   Yes [provider]  BIOTIN PO Take 2 tablets by mouth daily.   Yes [provider]  BLACK CURRANT SEED OIL PO Take 2 capsules by mouth daily.   Yes [provider]  Ginkgo Biloba 40 MG TABS Take 80 mg by mouth daily.   Yes [provider]  Glucosamine HCl (GLUCOSAMINE PO) Take 2 tablets by mouth daily.   Yes [provider]  Methylsulfonylmethane (MSM PO) Take 2 tablets by mouth daily.   Yes [provider]  metoprolol succinate (TOPROL-XL) 25 MG 24 hr tablet Take 1 tablet (25 mg total) by  mouth daily for 30 days. 01/03/19 02/02/19 Yes Donne Hazel, MD  Multiple Vitamins-Minerals (ALIVE WOMENS 50+) TABS Take 1 tablet by mouth daily.   Yes [provider]  naproxen sodium (ALEVE) 220 MG tablet Take 220 mg by mouth daily.    Yes [provider]  glipiZIDE (GLUCOTROL) 10 MG tablet Take 1 tablet (10 mg total) by mouth daily before breakfast. Patient not taking: Reported on 01/24/2019 10/05/18   Charlesetta Shanks, MD  metFORMIN (GLUCOPHAGE) 1000 MG tablet Take 1 tablet (1,000 mg total) by mouth 2 (two) times daily with a meal. Patient not taking:  Reported on 01/24/2019 10/05/18   Charlesetta Shanks, MD    Physical Exam: Vitals:   01/24/19 1720 01/24/19 1730 01/24/19 1827 01/24/19 1904  BP:  (!) 118/55  (!) 129/51  Pulse: 98 93 90 85  Resp: 16 19 (!) 21   Temp:      TempSrc:      SpO2: 93% 93% 92% 96%  Weight:      Height:          Constitutional: Moderately built and nourished. Vitals:   01/24/19 1720 01/24/19 1730 01/24/19 1827 01/24/19 1904  BP:  (!) 118/55  (!) 129/51  Pulse: 98 93 90 85  Resp: 16 19 (!) 21   Temp:      TempSrc:      SpO2: 93% 93% 92% 96%  Weight:      Height:       Eyes: Anicteric no pallor. ENMT: No discharge from the ears eyes nose and mouth. Neck: No mass felt.  No neck rigidity.  No JVD appreciated. Respiratory: No rhonchi or crepitations. Cardiovascular: S1-S2 heard. Abdomen: Soft nontender bowel sounds present. Musculoskeletal: No edema. Skin: No rash. Neurologic: Alert awake oriented to time place and person.  Moves all extremities. Psychiatric: Appears normal.  Normal affect.   Labs on Admission: I have personally reviewed following labs and imaging studies  CBC: Recent Labs  Lab 01/24/19 1603  WBC 8.1  HGB 14.1  HCT 45.0  MCV 95.5  PLT 329   Basic Metabolic Panel: Recent Labs  Lab 01/24/19 1603  NA 137  K 4.0  CL 99  CO2 28  GLUCOSE 293*  BUN 9  CREATININE 0.50  CALCIUM 9.6   GFR: Estimated Creatinine Clearance: 114.7 mL/min (by C-G formula based on SCr of 0.5 mg/dL). Liver Function Tests: No results for input(s): AST, ALT, ALKPHOS, BILITOT, PROT, ALBUMIN in the last 168 hours. No results for input(s): LIPASE, AMYLASE in the last 168 hours. No results for input(s): AMMONIA in the last 168 hours. Coagulation Profile: No results for input(s): INR, PROTIME in the last 168 hours. Cardiac Enzymes: No results for input(s): CKTOTAL, CKMB, CKMBINDEX, TROPONINI in the last 168 hours. BNP (last 3 results) No results for input(s): PROBNP in the last 8760  hours. HbA1C: No results for input(s): HGBA1C in the last 72 hours. CBG: No results for input(s): GLUCAP in the last 168 hours. Lipid Profile: No results for input(s): CHOL, HDL, LDLCALC, TRIG, CHOLHDL, LDLDIRECT in the last 72 hours. Thyroid Function Tests: Recent Labs    01/24/19 1833  TSH 1.624   Anemia Panel: No results for input(s): VITAMINB12, FOLATE, FERRITIN, TIBC, IRON, RETICCTPCT in the last 72 hours. Urine analysis:    Component Value Date/Time   COLORURINE STRAW (A) 10/05/2018 1353   APPEARANCEUR CLEAR 10/05/2018 1353   LABSPEC 1.029 10/05/2018 1353   PHURINE 5.0 10/05/2018 1353   GLUCOSEU >=500 (A)  10/05/2018 1353   HGBUR NEGATIVE 10/05/2018 1353   BILIRUBINUR NEGATIVE 10/05/2018 1353   KETONESUR 5 (A) 10/05/2018 1353   PROTEINUR NEGATIVE 10/05/2018 1353   UROBILINOGEN 0.2 05/04/2009 1627   NITRITE NEGATIVE 10/05/2018 1353   LEUKOCYTESUR NEGATIVE 10/05/2018 1353   Sepsis Labs: @LABRCNTIP (procalcitonin:4,lacticidven:4) )No results found for this or any previous visit (from the past 240 hour(s)).   Radiological Exams on Admission: Dg Chest 2 View  Result Date: 01/24/2019 CLINICAL DATA:  Left-sided chest pain and shortness of breath. EXAM: CHEST - 2 VIEW COMPARISON:  01/01/2019 FINDINGS: Cardiomediastinal silhouette is normal. Mediastinal contours appear intact. There is no evidence of focal airspace consolidation, pleural effusion or pneumothorax. Possible mild pulmonary vascular congestion. Osseous structures are without acute abnormality. Soft tissues are grossly normal. IMPRESSION: Possible mild pulmonary vascular congestion. Electronically Signed   By: Fidela Salisbury M.D.   On: 01/24/2019 16:27   Ct Angio Chest Pe W And/or Wo Contrast  Result Date: 01/24/2019 CLINICAL DATA:  53 y/o F; shortness of breath and chest discomfort with exertion for 1 month. EXAM: CT ANGIOGRAPHY CHEST WITH CONTRAST TECHNIQUE: Multidetector CT imaging of the chest was performed  using the standard protocol during bolus administration of intravenous contrast. Multiplanar CT image reconstructions and MIPs were obtained to evaluate the vascular anatomy. CONTRAST:  146mL ISOVUE-370 IOPAMIDOL (ISOVUE-370) INJECTION 76% COMPARISON:  None. FINDINGS: Cardiovascular: Mild cardiomegaly. Enlarged main pulmonary artery measuring 3.6 cm. Normal caliber thoracic aorta. Mild aortic calcific atherosclerosis. Satisfactory opacification of pulmonary arteries. No pulmonary embolus identified. Mediastinum/Nodes: Thyroid goiter extending into the retrosternal space. No mediastinal or axillary lymphadenopathy. Normal esophagus. Lungs/Pleura: Mosaic attenuation of the lungs. No consolidation, effusion, or pneumothorax. Upper Abdomen: No acute abnormality. Musculoskeletal: No chest wall abnormality. No acute or significant osseous findings. Multilevel discogenic degenerative changes of the thoracic spine. Review of the MIP images confirms the above findings. IMPRESSION: 1. No pulmonary embolus identified. 2. Enlarged main pulmonary artery may represent pulmonary artery hypertension. 3. Mosaic attenuation of the lungs compatible with small vessel or small airways disease. 4. Thyroid goiter extending into retrosternal space. 5.  Aortic Atherosclerosis (ICD10-I70.0). Electronically Signed   By: Kristine Garbe M.D.   On: 01/24/2019 19:10    EKG: Independently reviewed.  Sinus tachycardia.  Assessment/Plan Principal Problem:   Chest pain Active Problems:   Paroxysmal atrial fibrillation (HCC)   Essential hypertension   Severe obstructive sleep apnea   Exertional dyspnea    1. Exertional dyspnea with some chest tightness -we will cycle cardiac markers.  1 dose of Lasix was given will follow results.  CAT scan was showing possible pulmonary hypertension recent 2D echo done showed normal EF but no mention of pulmonary pressures.  Will consult cardiology.  Follow intake output metabolic  panel. 2. Proximal atrial fibrillation presently in sinus rhythm on beta-blocker and apixaban. 3. Sleep apnea has been noncompliant with her CPAP. 4. Diabetes mellitus type 2 has not been taking her medications due to financial issues.  We will keep patient on sliding scale coverage. 5. Morbid obesity. 6. Goiter extending to the substernal area will need further work-up.  TSH is normal.  Free T3-T4 pending. 7. Mild hypomagnesemia.  Replace recheck.   DVT prophylaxis: Lovenox. Code Status: Full code. Family Communication: Discussed with patient. Disposition Plan: Home.  Consults called: Cardiology. Admission status: Observation.   Rise Patience MD [facility] Pager [phone].  If 7PM-7AM, please contact night-coverage [email or web address] Password [facility]  01/24/2019, 7:52 PM

## 2019-01-25 ENCOUNTER — Encounter (HOSPITAL_COMMUNITY): Payer: Self-pay | Admitting: Cardiology

## 2019-01-25 DIAGNOSIS — I480 Paroxysmal atrial fibrillation: Secondary | ICD-10-CM

## 2019-01-25 DIAGNOSIS — R0609 Other forms of dyspnea: Secondary | ICD-10-CM

## 2019-01-25 DIAGNOSIS — R0601 Orthopnea: Secondary | ICD-10-CM

## 2019-01-25 DIAGNOSIS — I10 Essential (primary) hypertension: Secondary | ICD-10-CM

## 2019-01-25 DIAGNOSIS — G4733 Obstructive sleep apnea (adult) (pediatric): Secondary | ICD-10-CM

## 2019-01-25 DIAGNOSIS — R002 Palpitations: Secondary | ICD-10-CM

## 2019-01-25 DIAGNOSIS — R9431 Abnormal electrocardiogram [ECG] [EKG]: Secondary | ICD-10-CM

## 2019-01-25 DIAGNOSIS — E049 Nontoxic goiter, unspecified: Secondary | ICD-10-CM

## 2019-01-25 DIAGNOSIS — R079 Chest pain, unspecified: Secondary | ICD-10-CM

## 2019-01-25 DIAGNOSIS — R0902 Hypoxemia: Secondary | ICD-10-CM

## 2019-01-25 DIAGNOSIS — E1169 Type 2 diabetes mellitus with other specified complication: Secondary | ICD-10-CM

## 2019-01-25 LAB — LIPID PANEL
Cholesterol: 186 mg/dL (ref 0–200)
HDL: 53 mg/dL (ref >40)
LDL Cholesterol: 114 mg/dL — ABNORMAL HIGH (ref 0–99)
Total CHOL/HDL Ratio: 3.5 RATIO
Triglycerides: 95 mg/dL (ref <150)
VLDL: 19 mg/dL (ref 0–40)

## 2019-01-25 LAB — GLUCOSE, CAPILLARY
GLUCOSE-CAPILLARY: 240 mg/dL — AB (ref 70–99)
GLUCOSE-CAPILLARY: 238 mg/dL — AB (ref 70–99)
Glucose-Capillary: 249 mg/dL — ABNORMAL HIGH (ref 70–99)

## 2019-01-25 LAB — CBC
HCT: 43.5 % (ref 36.0–46.0)
Hemoglobin: 13.3 g/dL (ref 12.0–15.0)
MCH: 28.9 pg (ref 26.0–34.0)
MCHC: 30.6 g/dL (ref 30.0–36.0)
MCV: 94.4 fL (ref 80.0–100.0)
Platelets: 244 10*3/uL (ref 150–400)
RBC: 4.61 MIL/uL (ref 3.87–5.11)
RDW: 12.4 % (ref 11.5–15.5)
WBC: 8 10*3/uL (ref 4.0–10.5)
nRBC: 0 % (ref 0.0–0.2)

## 2019-01-25 LAB — BASIC METABOLIC PANEL
ANION GAP: 11 (ref 5–15)
BUN: 8 mg/dL (ref 6–20)
CO2: 27 mmol/L (ref 22–32)
Calcium: 9.2 mg/dL (ref 8.9–10.3)
Chloride: 97 mmol/L — ABNORMAL LOW (ref 98–111)
Creatinine, Ser: 0.53 mg/dL (ref 0.44–1.00)
GFR calc Af Amer: >60 mL/min (ref >60)
GFR calc non Af Amer: >60 mL/min (ref >60)
Glucose, Bld: 311 mg/dL — ABNORMAL HIGH (ref 70–99)
Potassium: 3.8 mmol/L (ref 3.5–5.1)
Sodium: 135 mmol/L (ref 135–145)

## 2019-01-25 LAB — TROPONIN I
Troponin I: <0.03 ng/mL (ref <0.03)
Troponin I: <0.03 ng/mL (ref <0.03)

## 2019-01-25 LAB — T3: T3, Total: 81 ng/dL (ref 71–180)

## 2019-01-25 LAB — T4: T4, Total: 5.9 ug/dL (ref 4.5–12.0)

## 2019-01-25 LAB — MAGNESIUM: Magnesium: 1.6 mg/dL — ABNORMAL LOW (ref 1.7–2.4)

## 2019-01-25 MED ORDER — METFORMIN HCL 1000 MG PO TABS: 1000.0000 mg | ORAL_TABLET | Freq: Two times a day (BID) | ORAL | 0 refills | Status: DC

## 2019-01-25 MED ORDER — APIXABAN 5 MG PO TABS: 5.0000 mg | ORAL_TABLET | Freq: Two times a day (BID) | ORAL | 0 refills | Status: DC

## 2019-01-25 MED ORDER — METOPROLOL SUCCINATE ER 25 MG PO TB24
25.0000 mg | ORAL_TABLET | Freq: Once | ORAL | Status: AC
  Administered 2019-01-25: 25 mg via ORAL
  Filled 2019-01-25: qty 1

## 2019-01-25 MED ORDER — METOPROLOL SUCCINATE ER 50 MG PO TB24: 50.0000 mg | ORAL_TABLET | Freq: Every day | ORAL | 0 refills | Status: DC

## 2019-01-25 MED ORDER — INSULIN GLARGINE 100 UNIT/ML ~~LOC~~ SOLN
14.0000 [IU] | Freq: Every day | SUBCUTANEOUS | Status: DC
  Filled 2019-01-25: qty 0.14

## 2019-01-25 MED ORDER — METOPROLOL SUCCINATE ER 50 MG PO TB24: 50.0000 mg | ORAL_TABLET | Freq: Every day | ORAL | Status: DC

## 2019-01-25 MED ORDER — GLIPIZIDE 10 MG PO TABS: 10.0000 mg | ORAL_TABLET | Freq: Every day | ORAL | 0 refills | Status: DC

## 2019-01-25 NOTE — Progress Notes (Signed)
Patient ambulate in hallway today. She denied any SOB or discomfort. Will continue to monitor.  Ave Filter, RN

## 2019-01-25 NOTE — Progress Notes (Signed)
Spoke with patient regarding diabetes.  She admits to running out of medications for DM.  She plans to f/u at clinic off [facility]?    We discussed her A1C results, normal blood sugar levels, standard of care, and basic diet information (including the plate method).  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  She states that she takes snacks with her to school but feels like she is "always hungry".  We reviewed plate method and serving size.  Patient was unaware that fruit is a CHO that raises blood sugars. WE briefly reviewed non-starchy vegetable options as well.  Discussed complications of DM.  Patient states that "I have to take care of myself so I can take care of my patients".  Gave her handout regarding A1C, plate method/health eating, and standards of care.  We also discussed hypoglycemia symptoms and the 15:15 rule for treating hypoglycemia.  Patient appreciative of information.  She wants to resume her oral medications and "try to do better".  She does not want to take insulin, although I reminded her that she may need it to keep blood sugars controlled.   Thanks,  Adah Perl, RN, BC-ADM Inpatient Diabetes Coordinator Pager [phone] (8a-5p)

## 2019-01-25 NOTE — Progress Notes (Signed)
Inpatient Diabetes Program Recommendations  AACE/ADA: New Consensus Statement on Inpatient Glycemic Control (2015)  Target Ranges:  Prepandial:   less than 140 mg/dL      Peak postprandial:   less than 180 mg/dL (1-2 hours)      Critically ill patients:  140 - 180 mg/dL   Lab Results  Component Value Date   GLUCAP 238 (H) 01/25/2019   HGBA1C 10.1 (H) 01/02/2019    Review of Glycemic Control Results for Haley [name], Haley [name] (MRN [id number]) as of 01/25/2019 12:04  Ref. Range 01/24/2019 21:19 01/25/2019 06:45 01/25/2019 11:26  Glucose-Capillary Latest Ref Range: 70 - 99 mg/dL 206 (H) 240 (H) 238 (H)   Diabetes history: Type 2 DM Outpatient Diabetes medications: Glipzide 10 mg QAM, Metformin 1000 mg BID Current orders for Inpatient glycemic control: Novolog 0-9 units TID  Inpatient Diabetes Program Recommendations:    Noted this is patient's 5th visit to ED in last year. In each case, patient ran out of prescriptions and needed refills. No noted follow up with PCP. Noted case management consult placed.   Looking at current inpatient trends would consider adding Lantus 14 units QHS. After speaking with patient, she is refusing outpatient insulin at this time. Plans to follow up with PCP.  Thanks, Bronson Curb, MSN, RNC-OB Diabetes Coordinator [phone] (8a-5p)

## 2019-01-25 NOTE — Progress Notes (Signed)
RT Note:  Spoke with patient about CPAP order, asked if patient used CPAP at home.  Patient stated that she used to, but machine needed to be re-calibrated and has not used it since.  Discussed if patient knew settings or which mask she used.  Patient stated that she did not know settings, she did tell which mask she used, but after conversation, patient stated that she only expected to be here one night and did not want the extra charge on her bill.

## 2019-01-25 NOTE — Progress Notes (Signed)
NURSING PROGRESS NOTE  OLIVINE HIERS [id number] Discharge Data: 01/25/2019 6:19 PM Attending Provider: No att. providers found [id number], No Pcp Per     Judeen Hammans A [place] to be D/C'd Home per MD order.  Discussed with the patient the After Visit Summary and all questions fully answered. All IV's discontinued with no bleeding noted. All belongings returned to patient for patient to take home.   Last Vital Signs:  Blood pressure 127/75, pulse 92, temperature 99.2 F (37.3 C), temperature source Oral, resp. rate 18, height 5\' 7"  (1.702 m), weight 129.7 kg, SpO2 91 %.  Discharge Medication List Allergies as of 01/25/2019   No Known Allergies     Medication List    STOP taking these medications   BACK & BODY EXTRA STRENGTH PO   naproxen sodium 220 MG tablet Commonly known as:  ALEVE     TAKE these medications   acetaminophen 500 MG tablet Commonly known as:  TYLENOL Take 500 mg by mouth daily.   ALIVE WOMENS 50+ Tabs Take 1 tablet by mouth daily.   apixaban 5 MG Tabs tablet Commonly known as:  ELIQUIS Take 1 tablet (5 mg total) by mouth 2 (two) times daily.   aspirin EC 81 MG tablet Take 81 mg by mouth 3 (three) times a week.   BIOTIN PO Take 2 tablets by mouth daily.   BLACK CURRANT SEED OIL PO Take 2 capsules by mouth daily.   Ginkgo Biloba 40 MG Tabs Take 80 mg by mouth daily.   glipiZIDE 10 MG tablet Commonly known as:  GLUCOTROL Take 1 tablet (10 mg total) by mouth daily before breakfast.   GLUCOSAMINE PO Take 2 tablets by mouth daily.   metFORMIN 1000 MG tablet Commonly known as:  GLUCOPHAGE Take 1 tablet (1,000 mg total) by mouth 2 (two) times daily with a meal.   metoprolol succinate 50 MG 24 hr tablet Commonly known as:  TOPROL-XL Take 1 tablet (50 mg total) by mouth daily. Take with or immediately following a meal. Start taking on:  January 26, 2019 What changed:    medication strength  how much to take  additional instructions    MSM PO Take 2 tablets by mouth daily.

## 2019-01-25 NOTE — Consult Note (Addendum)
Cardiology Consultation:   Patient ID: Haley [name] MRN: [id number]; DOB: May 26, 1966  Admit date: 01/24/2019 Date of Consult: 01/25/2019  Primary Care Provider: Patient, No Pcp Per Primary Cardiologist: [name], MD  Primary Electrophysiologist:  None    Patient Profile:   Haley [name] is a 53 y.o. female with a hx of obesity, HTN, DM, severe sleep apnea and PAF dating back to 2017 at least.   + goiter  CHA2DS2VASc score is at least 3 who is being seen today for the evaluation of chest pain and SOB at the request of Dr. Tamala Julian.  History of Present Illness:   Haley [name] with above hx has been having SOB.  Recent hospitalization in Jan 2020 with palpitations after taking Dayquil.  She had stopped BB for 2 months.  She was not using her CPAP mask due to equipment issues.   She did have brief episode of hypoxia in ER at that time also with chest tightness.  troponins were negative.  Echo with normal EF.  She is on Eliquis for PAF and lopressor stopped and toprol XL added.  Neg venous dopplers.   Now returns with chest pain and exertional dyspnea.  Has been occurring more freq. And resolves with rest.  Pt has been noncompliant with meds due to financial issues.  She did see PCP and was hypoxic.   In ER had CTA with no PE but possible PH and possible small airway disease.  She was given IV lasix.   EKG:  The EKG was personally reviewed and demonstrates:  SR with T wave inversions in lateral leads new.  Telemetry:  Telemetry was personally reviewed and demonstrates:  SR to ST with PSVT at 150 for 9 sec.    troponins all neg  BNP 16.7 Na 135, K+ 3.8, Cr 0.53 Mag 1.6  LDL 114 Hgb 13.3    CTA of chest: Mild cardiomegaly. Enlarged main pulmonary artery measuring 3.6 cm. Normal caliber thoracic aorta. Mild aortic calcific atherosclerosis. Satisfactory opacification of pulmonary arteries. No pulmonary embolus identified. IMPRESSION: 1. No pulmonary embolus  identified. 2. Enlarged main pulmonary artery may represent pulmonary artery hypertension. 3. Mosaic attenuation of the lungs compatible with small vessel or small airways disease. 4. Thyroid goiter extending into retrosternal space. 5.  Aortic Atherosclerosis (ICD10-I70.0).   Currently after lasix she is feeling better. No SOB or chest tightness.  occ she may have rapid HR but brief.     Past Medical History:  Diagnosis Date  . Essential hypertension   . Fibroids   . Hyperthyroidism    a. 2010, treated w/ tapazole.  . Morbid obesity ([place])   . Paroxysmal atrial fibrillation ([place])    a. 03/2009 - in setting of hyperthyroidism and caffeine intake, converted spontaneously;  b. 08/2016 ED visit for recurrent PAF-->successful DCCV in ED.  [name] Snores   . Type II diabetes mellitus ([place])     Past Surgical History:  Procedure Laterality Date  . UTERINE FIBROID SURGERY       Home Medications:  Prior to Admission medications   Medication Sig Start Date End Date Taking? Authorizing Provider  acetaminophen (TYLENOL) 500 MG tablet Take 500 mg by mouth daily.   Yes [provider]  apixaban (ELIQUIS) 5 MG TABS tablet Take 1 tablet (5 mg total) by mouth 2 (two) times daily. 06/26/18  Yes Malvin Johns, MD  aspirin EC 81 MG tablet Take 81 mg by mouth 3 (three) times a week.   Yes [provider]  Aspirin-Caffeine (BACK & BODY EXTRA STRENGTH PO) Take 2 tablets by mouth daily as needed (for pain).   Yes [provider]  BIOTIN PO Take 2 tablets by mouth daily.   Yes [provider]  BLACK CURRANT SEED OIL PO Take 2 capsules by mouth daily.   Yes [provider]  Ginkgo Biloba 40 MG TABS Take 80 mg by mouth daily.   Yes [provider]  Glucosamine HCl (GLUCOSAMINE PO) Take 2 tablets by mouth daily.   Yes [provider]  Methylsulfonylmethane (MSM PO) Take 2 tablets by mouth daily.   Yes [provider]  metoprolol succinate  (TOPROL-XL) 25 MG 24 hr tablet Take 1 tablet (25 mg total) by mouth daily for 30 days. 01/03/19 02/02/19 Yes Donne Hazel, MD  Multiple Vitamins-Minerals (ALIVE WOMENS 50+) TABS Take 1 tablet by mouth daily.   Yes [provider]  naproxen sodium (ALEVE) 220 MG tablet Take 220 mg by mouth daily.    Yes [provider]  glipiZIDE (GLUCOTROL) 10 MG tablet Take 1 tablet (10 mg total) by mouth daily before breakfast. Patient not taking: Reported on 01/24/2019 10/05/18   Charlesetta Shanks, MD  metFORMIN (GLUCOPHAGE) 1000 MG tablet Take 1 tablet (1,000 mg total) by mouth 2 (two) times daily with a meal. Patient not taking: Reported on 01/24/2019 10/05/18   Charlesetta Shanks, MD    Inpatient Medications: Scheduled Meds: . apixaban  5 mg Oral BID  . aspirin EC  81 mg Oral Once per day on Mon Wed Fri  . insulin aspart  0-9 Units Subcutaneous TID WC  . metoprolol succinate  25 mg Oral Daily   Continuous Infusions:  PRN Meds: acetaminophen **OR** acetaminophen, ondansetron **OR** ondansetron (ZOFRAN) IV  Allergies:   No Known Allergies  Social History:   Social History   Socioeconomic History  . Marital status: Married    Spouse name: Not on file  . Number of children: Not on file  . Years of education: Not on file  . Highest education level: Not on file  Occupational History  . Occupation: Theatre manager  Social Needs  . Financial resource strain: Not on file  . Food insecurity:    Worry: Not on file    Inability: Not on file  . Transportation needs:    Medical: Not on file    Non-medical: Not on file  Tobacco Use  . Smoking status: Former Smoker    Packs/day: 1.00    Years: 32.00    Pack years: 32.00    Last attempt to quit: 2012    Years since quitting: 8.1  . Smokeless tobacco: Never Used  . Tobacco comment: patient vapes  Substance and Sexual Activity  . Alcohol use: No    Comment: occasional drink.  . Drug use: No  . Sexual activity: Not on file  Lifestyle   . Physical activity:    Days per week: Not on file    Minutes per session: Not on file  . Stress: Not on file  Relationships  . Social connections:    Talks on phone: Not on file    Gets together: Not on file    Attends religious service: Not on file    Active member of club or organization: Not on file    Attends meetings of clubs or organizations: Not on file    Relationship status: Not on file  . Intimate partner violence:    Fear of current or ex partner:  Not on file    Emotionally abused: Not on file    Physically abused: Not on file    Forced sexual activity: Not on file  Other Topics Concern  . Not on file  Social History Narrative   Lives in [place] with husband.  Systems analyst.  Also in school @ [employer] for [employer].  Does not routinely exercise.    Family History:    Family History  Problem Relation Age of Onset  . Hypertension Mother   . Diabetes Mellitus II Sister   . Cancer Maternal Grandmother      ROS:  Please see the history of present illness.  General:no colds or fevers, no weight changes Skin:no rashes or ulcers HEENT:no blurred vision, no congestion CV:see HPI PUL:see HPI GI:no diarrhea constipation or melena, no indigestion GU:no hematuria, no dysuria MS:no joint pain, no claudication Neuro:no syncope, no lightheadedness Endo:+ diabetes, + thyroid disease  All other ROS reviewed and negative.     Physical Exam/Data:   Vitals:   01/24/19 2337 01/25/19 0500 01/25/19 0534 01/25/19 0931  BP: (!) 120/56  (!) 111/56 127/75  Pulse: 88  86 92  Resp: 17  18 18   Temp: 97.9 F (36.6 C)  97.7 F (36.5 C) 99.2 F (37.3 C)  TempSrc: Oral  Oral Oral  SpO2: 97%  94% 91%  Weight:  129.7 kg    Height:        Intake/Output Summary (Last 24 hours) at 01/25/2019 1300 Last data filed at 01/25/2019 0839 Gross per 24 hour  Intake 410 ml  Output 700 ml  Net -290 ml   Last 3 Weights 01/25/2019 01/24/2019 01/01/2019  Weight (lbs) 285 lb 15 oz 283  lb 280 lb  Weight (kg) 129.7 kg 128.368 kg 127.007 kg     Body mass index is 44.78 kg/m.  General:  Well nourished, well developed, in no acute distress, feeling better HEENT: normal Lymph: no adenopathy Neck: no JVD Endocrine:  No thryomegaly Vascular: No carotid bruits; pedal pulses 2+ bilaterally   Cardiac:  normal S1, S2; RRR; no murmur gallup rub or click Lungs:  clear to auscultation bilaterally, no wheezing, rhonchi or rales  Abd: soft, nontender, no hepatomegaly  Ext: no edema Musculoskeletal:  No deformities, BUE and BLE strength normal and equal Skin: warm and dry  Neuro:  Alert and oriented X 3 MAE follows commands, no focal abnormalities noted Psych:  Normal affect    Relevant CV Studies: Echo 01/02/19 IMPRESSIONS    1. The left ventricle appears to be normal in size, has normal wall thickness 60-65% ejection fraction Spectral Doppler shows normal pattern of diastolic filling.  2. The right ventricle has normal size and normal systolic function.  3. Normal left atrial size.  4. Normal right atrial size.  5. Mitral valve regurgitation is trivial by color flow Doppler.  6. No atrial level shunt detected by color flow Doppler.  7. Right ventricular systolic pressure is could not be assessed.  FINDINGS  Left Ventricle: The left ventricle appears to be normal in size, has normal wall thickness normal systolic function with a 32-99% ejection fraction Spectral Doppler shows normal pattern of diastolic filling. Although no regional wall motion  abnormalities were detected, they cannot be completely excluded on the basis of this study. Right Ventricle: The right ventricle is normal in size normal wall thickness has normal systolic function. Left Atrium: The left atrium is normal in size. Right Atrium: The right atrial size is normal in  size. Interatrial Septum: No atrial level shunt detected by color flow Doppler. Increased thickness of the atrial septum sparing the fossa  ovalis consistent with The interatrial septum appears to be lipomatous.  Pericardium: There is no evidence of pericardial effusion. Mitral Valve: The mitral valve normal in structure and function. Mitral valve regurgitation is trivial by color flow Doppler. Tricuspid Valve: The tricuspid valve is normal in structure. Tricuspid regurgitation is trivial by color flow Doppler. Aortic Valve: The aortic valve has an indeterminant number of cusps. Pulmonic Valve: The pulmonic valve was not well visualized. The pulmonic valve is grossly normal. Pulmonic valve regurgitation is trivial by color flow Doppler. Venous: The inferior vena cava was not well visualized. The inferior vena cava was normal in size with greater than 50% respiratory variablity.     Laboratory Data:  Chemistry Recent Labs  Lab 01/24/19 1603 01/25/19 0204  NA 137 135  K 4.0 3.8  CL 99 97*  CO2 28 27  GLUCOSE 293* 311*  BUN 9 8  CREATININE 0.50 0.53  CALCIUM 9.6 9.2  GFRNONAA >60 >60  GFRAA >60 >60  ANIONGAP 10 11    No results for input(s): PROT, ALBUMIN, AST, ALT, ALKPHOS, BILITOT in the last 168 hours. Hematology Recent Labs  Lab 01/24/19 1603 01/25/19 0204  WBC 8.1 8.0  RBC 4.71 4.61  HGB 14.1 13.3  HCT 45.0 43.5  MCV 95.5 94.4  MCH 29.9 28.9  MCHC 31.3 30.6  RDW 12.3 12.4  PLT 262 244   Cardiac Enzymes Recent Labs  Lab 01/24/19 2002 01/25/19 0204 01/25/19 0850  TROPONINI <0.03 <0.03 <0.03    Recent Labs  Lab 01/24/19 1615  TROPIPOC 0.00    BNP Recent Labs  Lab 01/24/19 1834  BNP 16.7    DDimer No results for input(s): DDIMER in the last 168 hours.  Radiology/Studies:  Dg Chest 2 View  Result Date: 01/24/2019 CLINICAL DATA:  Left-sided chest pain and shortness of breath. EXAM: CHEST - 2 VIEW COMPARISON:  01/01/2019 FINDINGS: Cardiomediastinal silhouette is normal. Mediastinal contours appear intact. There is no evidence of focal airspace consolidation, pleural effusion or  pneumothorax. Possible mild pulmonary vascular congestion. Osseous structures are without acute abnormality. Soft tissues are grossly normal. IMPRESSION: Possible mild pulmonary vascular congestion. Electronically Signed   By: Fidela Salisbury M.D.   On: 01/24/2019 16:27   Ct Angio Chest Pe W And/or Wo Contrast  Result Date: 01/24/2019 CLINICAL DATA:  53 y/o F; shortness of breath and chest discomfort with exertion for 1 month. EXAM: CT ANGIOGRAPHY CHEST WITH CONTRAST TECHNIQUE: Multidetector CT imaging of the chest was performed using the standard protocol during bolus administration of intravenous contrast. Multiplanar CT image reconstructions and MIPs were obtained to evaluate the vascular anatomy. CONTRAST:  149mL ISOVUE-370 IOPAMIDOL (ISOVUE-370) INJECTION 76% COMPARISON:  None. FINDINGS: Cardiovascular: Mild cardiomegaly. Enlarged main pulmonary artery measuring 3.6 cm. Normal caliber thoracic aorta. Mild aortic calcific atherosclerosis. Satisfactory opacification of pulmonary arteries. No pulmonary embolus identified. Mediastinum/Nodes: Thyroid goiter extending into the retrosternal space. No mediastinal or axillary lymphadenopathy. Normal esophagus. Lungs/Pleura: Mosaic attenuation of the lungs. No consolidation, effusion, or pneumothorax. Upper Abdomen: No acute abnormality. Musculoskeletal: No chest wall abnormality. No acute or significant osseous findings. Multilevel discogenic degenerative changes of the thoracic spine. Review of the MIP images confirms the above findings. IMPRESSION: 1. No pulmonary embolus identified. 2. Enlarged main pulmonary artery may represent pulmonary artery hypertension. 3. Mosaic attenuation of the lungs compatible with small vessel or small airways disease.  4. Thyroid goiter extending into retrosternal space. 5.  Aortic Atherosclerosis (ICD10-I70.0). Electronically Signed   By: Kristine Garbe M.D.   On: 01/24/2019 19:10    Assessment and Plan:    1. Chest pain and dyspnea with hypoxia, neg MI and CTA of chest does not mention coronary artery calcifications.   Possible due to pulmonary HTN may need RHC--today ambulating without SOB 2. PAF no recent documentation of a fib. She is on Eliquis, could stop home ASA. 3. Severe sleep apnea, not wearing CPAP needs new equipment.  4. DM2 per IM 5. Mild possible pulmonary vascular congestion on CXR  Given a dose of lasix-20 mg and neg 290. 6. Stopped tobacco in 2012.  7. PSVT ST at 150.        For questions or updates, please contact [facility] Please consult [email or web address] for contact info under   Signed, Cecilie Kicks, NP  01/25/2019 1:00 PM    Patient seen and examined. Agree with assessment and plan.  Haley [name] is a morbidly obese African-American female who has a history of hypertension, diabetes mellitus, paroxysmal atrial fibrillation, as well as severe sleep apnea.  She has not had any insurance.  On her initial diagnostic sleep evaluation apnea popping index was markedly elevated at 111/h.  During REM sleep, AHI was very severe at 74.2/h.  She had severe oxygen desaturation to a nadir of 51%.  In the past she had been given CPAP machine by a friend.  I had seen her several years ago for evaluation and had recommended BiPAP therapy.  Apparently, she had gone to advance home [facility] but apparently was never able to receive her machine.  As result she has not used treatment.  She had been recently hospitalized in January 2020 with palpitations after taking DayQuil.  She had stopped her beta-blocker for several months prior to that.  Is only, Toprol-XL 25 mg was added to her regimen.  She had noticed some mild exertional dyspnea as well as leg swelling.  Review of telemetry indicates a short burst of possible PSVT 9 seconds at 035 bpm but was uncertain to assess if there were overlapping P  waves raising the potential possibility of atrial flutter.  The rhythm was regular and not  irregularly irregular.  Presently, she is without chest pain.  Blood pressure is 127/75 pulse was around 90.  Sclerae anicteric.  There was no lid lag.  Nobody scale is at least a 3.  She had thick neck.  Lungs were clear without wheezing.  There was no chest wall tenderness.  She was morbidly obese.  It was trivial left greater than right edema.  Her edema has improved since her hospitalization and following a dose of Lasix.  Her echo Doppler study in January 2020 has shown EF of 60 to 65%.  Right ventricular systolic pressure could not be assessed and as result there was no assessment of potential pulmonary hypertension.  I discussed with the patient the association between mild hyper pulmonary hypertension and sleep apnea.  We again also discussed potential nocturnal arrhythmias and increased risk for atrial fibrillation if underlying sleep apnea is untreated.  Presently, recommend further titration of beta-blocker therapy to Toprol-XL 50 mg.  Ultimately, the patient should be reevaluated to see if there is any way possible she can obtain a BiPAP machine for optimal treatment of her previously documented very severe sleep apnea.  Troy Sine, MD, [facility] 01/25/2019 2:05 PM

## 2019-01-25 NOTE — Care Management Note (Signed)
Case Management Note  Patient Details  Name: Haley [name] MRN: [id number] Date of Birth: 07/12/1966  Subjective/Objective:  53 yo female presented with chest tightness and SOB. CVE:[id number] atrial fibrillation, diabetes mellitus type 2, morbid obesity, sleep apnea                  Action/Plan: CM met with patient to discuss dispositional needs. Patient states living at home and being independent with her ADLs and working PTA. Patient does not have an established PCP, nor active health insurance, with a history of nonadherence. CM scheduled patient an appointment at: [facility] on 02/19/19 @ 1000, with patient able to utilize [facility] for her Rx needs; AVS updated. Patient was previously receiving patient assistance from [facility] for her Eliquis, but will need to reapply for 2020 and stated that she had not. CM requested for patient to contact Eliquis 360 rep to discuss the application process with an application faxed to [place] station, with Dr. Tamala Julian requested to complete the provider portion and to provide patient with a printed Rx prior to patient transitioning home. No further needs from CM.   Expected Discharge Date:                  Expected Discharge Plan:  Home/Self Care  In-House Referral:  NA  Discharge planning Services  CM Consult, Medication Assistance, Follow-up appt scheduled, [place] Acute Care Choice:  NA Choice offered to:  NA  DME Arranged:  N/A DME Agency:  NA  HH Arranged:  NA HH Agency:  NA  Status of Service:  Completed, signed off  If discussed at [place] of Stay Meetings, dates discussed:    Additional Comments:  Midge Minium RN, BSN, NCM-BC, ACM-RN [phone] 01/25/2019, 2:50 PM

## 2019-01-27 DIAGNOSIS — T471X5A Adverse effect of other antacids and anti-gastric-secretion drugs, initial encounter: Secondary | ICD-10-CM

## 2019-01-27 DIAGNOSIS — Z6841 Body Mass Index (BMI) 40.0 and over, adult: Secondary | ICD-10-CM

## 2019-01-27 NOTE — Discharge Summary (Addendum)
Haley [name], is a 53 y.o. female  DOB Oct 01, 1966  MRN [id number].  Admission date:  01/24/2019  Admitting Physician  Rise Patience, MD  Discharge Date:  01/25/2019   Primary MD  Patient, No Pcp Per  Recommendations for primary care physician for things to follow:   -Recheck magnesium level -Consider stopping home aspirin if patient noted to have compliance with Eliquis  Discharge Diagnosis  Principal Problem:   Chest pain Active Problems:   Paroxysmal atrial fibrillation (HCC)   Essential hypertension   Severe obstructive sleep apnea   Type II diabetes mellitus (HCC)   Exertional dyspnea   Obesity, Class III, BMI 40-49.9 (morbid obesity) ([place])   Magnesium carbonate adverse reaction   Hypomagnesemia      Past Medical History:  Diagnosis Date  . Essential hypertension   . Fibroids   . Hyperthyroidism    a. 2010, treated w/ tapazole.  . Morbid obesity ([place])   . Paroxysmal atrial fibrillation ([place])    a. 03/2009 - in setting of hyperthyroidism and caffeine intake, converted spontaneously;  b. 08/2016 ED visit for recurrent PAF-->successful DCCV in ED.  [name] Snores   . Type II diabetes mellitus ([place])     Past Surgical History:  Procedure Laterality Date  . UTERINE FIBROID SURGERY         HPI  from the history and physical done on the day of admission:  Haley [name] is a 53 y.o. female with history of paroxysmal atrial fibrillation, diabetes mellitus type 2, morbid obesity, sleep apnea who was just discharged 3 weeks ago after being admitted for chest tightness and palpitations and at that time 2D echo was largely unremarkable.  Patient also had intermittent swelling of the left lower extremity Dopplers done on January 01, 2019 was negative for DVT.  Patient was discharged home and has been  noncompliant with this medication due to any financial issues.  Patient had followed up with her primary care physician today when patient was found to be hypoxic and was referred to the ER.  Patient states over the last few weeks patient has been having exertional dyspnea and tightness which improves with rest.  ED Course: In the ER patient was found to be hypoxic.  CT angiogram of the chest was negative for PE but does show features concerning for pulmonary hypertension.  In addition is found to be having goiter.  TSH was normal troponin was negative BNP was 16.  Patient is CAT scan also shows mosaic pattern concerning for small airway disease.  Patient was given 1 dose of Lasix 40 mg admitted for further observation.       Hospital Course:   1.  Exertional dyspnea, chest pain, hypoxia: Patient presented with symptoms concerning for the possibility of pulmonary embolus given noncompliance with anticoagulation.  Hypoxia otherwise resolved and patient on room air maintaining O2 saturations. Troponins negative x3.  BNP 16.7. EKG with SR with T wave inversions in the lateral leads that appear to be new.  CT  angiogram of the chest showed cardiomegaly with no signs of pulmonary embolus, enlarged main pulmonary artery possibly representing pulmonary artery hypertension,  and thyroid goiter extending into the retrosternal space.  There is no report of coronary artery calcifications.  Last echocardiogram was from 01/02/2019 where EF was 60 to 65%. Cardiology recommended possible need of right heart catheterization for further evaluation.  Outpatient follow-up appointment set for 3/10.  Was recommended patient could stop home aspirin if taking Eliquis.  2.  Paroxysmal atrial fibrillation: CHA2DS2-VASc score = 3. Patient currently in sinus rhythm, but had recently been noncompliant with medications of Eliquis.  Noted financial difficulties.  Care management consulted and helped provide patient with information  some assistance with medication.  Cardiology recommended increasing Toprol XL to 50 mg daily.  3.  Diabetes mellitus type 2: Uncontrolled.  Last hemoglobin A1c 10.1 on 01/02/2019.  Patient reports that she had not been taking any of her diabetic medications due to financial issues.  Diabetic education was consulted and recommended patient starting on insulin, but patient declined.  Therefore, continued on metformin and glipizide.  4.  Thyroid goiter: Seen by CT angiogram of the chest.  Noted to extend into the retrosternal space.  TSH 1.624, and other thyroid studies noted to be within normal limits.  5.  Obstructive sleep apnea: Patient patient in need of new CPAP machine.  Symptoms possibly exacerbated with thyroid goiter.  Will need outpatient referral to sleep medicine.  6.  Hypomagnesemia: Patient's magnesium levels noted to be as low as 1.5 on admission.  She was given 2 g of magnesium sulfate IV, but repeat magnesium level following morning still.  Low at 1.6.  7.  Hyperlipidemia: Last LDL noted to be 114, cholesterol levels previously in the past have been noted to be at goal per review of records in 2010.  Patient currently not at goal.  Patient advised regarding need dietary modifications for better control of cholesterol and diabetes.  May likely warrant addition of statin in the outpatient setting.  8.  PSVT at 150 bpm for 9 sec noted on telemetry  9.  Morbid obesity BMI 44.70  Follow UP  Follow-up Information    [facility]. Go on 02/19/2019.   Why:  at 10:00am for your hospital follow-up appointment/establishing a primary care doctor and for discounted prescriptions for $4-$10 (has been confirmed with [facility] they can assist) Contact information: [place] [id number] [phone]       Troy Sine, MD Follow up on 02/12/2019.   Specialty:  Cardiology Why:   at 8 AM with his PA [facility] information: [address] [place] [address] [phone]            Consults obtained: Cardiology Dr. Claiborne Billings  Discharge Condition: Stable  Diet and Activity recommendation: See Discharge Instructions below   Discharge Instructions    Discharge instructions   Complete by:  As directed    Please take all medications as prescribed.  Cardiology and primary care provider office visits have already been scheduled and can be found on your discharge paperwork.  [facility]  Please follow a heart healthy and carb modified diet.  Check blood sugars regularly.  Get CBC and BMP - checked  by your primary when you go to establish care ( we routinely change or add medications that can affect your baseline  labs and fluid status, therefore we recommend that you get the mentioned basic workup next visit with your PCP, your PCP may decide not to get them or add new tests based on their clinical decision)  Activity: As tolerated   Disposition: Home        For Heart failure patients - Check your Weight same time everyday, if you gain over 2 pounds, or you develop in leg swelling, experience more shortness of breath or chest pain, call your Primary doctor immediately. Follow Cardiac Low Salt Diet and 1.5 lit/day fluid restriction.  Special Instructions: If you have smoked or chewed Tobacco  in the last 2 yrs please stop smoking, stop any regular Alcohol  and or any Recreational drug use.  On your next visit with your primary care physician please Get Medicines reviewed and adjusted.  Please request your Patient, No Pcp Per to go over all Hospital Tests and Procedure/Radiological results at the follow up, please get all Hospital records sent to your Prim MD by signing hospital release before you go home.  If you experience worsening of your admission symptoms, develop shortness of breath, life  threatening emergency, suicidal or homicidal thoughts you must seek medical attention immediately by calling 911 or calling your MD immediately  if symptoms less severe.  You Must read complete instructions/literature along with all the possible adverse reactions/side effects for all the Medicines you take and that have been prescribed to you. Take any new Medicines after you have completely understood and accpet all the possible adverse reactions/side effects.   Do not drive, operate heavy machinery, perform activities at heights, swimming or participation in water activities or provide baby sitting services if your were admitted for syncope or siezures until you have seen by Primary MD or a Neurologist and advised to do so again.  Do not drive when taking Pain medications.  Do not take more than prescribed Pain, Sleep and Anxiety Medications  Wear Seat belts while driving.   Please note  You were cared for by a hospitalist during your hospital stay. If you have any questions about your discharge medications or the care you received while you were in the hospital after you are discharged, you can call the unit and asked to speak with the hospitalist on call if the hospitalist that took care of you is not available. Once you are discharged, your primary care physician will handle any further medical issues. Please note that NO REFILLS for any discharge medications will be authorized once you are discharged, as it is imperative that you return to your primary care physician (or establish a relationship with a primary care physician if you do not have one) for your aftercare needs so that they can reassess your need for medications and monitor your lab values.   Increase activity slowly   Complete by:  As directed         Discharge Medications     Allergies as of 01/25/2019   No Known Allergies     Medication List    STOP taking these medications   BACK & BODY EXTRA STRENGTH PO     naproxen sodium 220 MG tablet Commonly known as:  ALEVE     TAKE these medications   acetaminophen 500 MG tablet Commonly known as:  TYLENOL Take 500 mg by mouth daily.   ALIVE WOMENS 50+ Tabs Take 1 tablet by mouth daily.   apixaban 5 MG Tabs tablet Commonly known as:  ELIQUIS Take 1 tablet (  5 mg total) by mouth 2 (two) times daily.   aspirin EC 81 MG tablet Take 81 mg by mouth 3 (three) times a week.   BIOTIN PO Take 2 tablets by mouth daily.   BLACK CURRANT SEED OIL PO Take 2 capsules by mouth daily.   Ginkgo Biloba 40 MG Tabs Take 80 mg by mouth daily.   glipiZIDE 10 MG tablet Commonly known as:  GLUCOTROL Take 1 tablet (10 mg total) by mouth daily before breakfast.   GLUCOSAMINE PO Take 2 tablets by mouth daily.   metFORMIN 1000 MG tablet Commonly known as:  GLUCOPHAGE Take 1 tablet (1,000 mg total) by mouth 2 (two) times daily with a meal.   metoprolol succinate 50 MG 24 hr tablet Commonly known as:  TOPROL-XL Take 1 tablet (50 mg total) by mouth daily. Take with or immediately following a meal. What changed:    medication strength  how much to take  additional instructions   MSM PO Take 2 tablets by mouth daily.       Major procedures and Radiology Reports - PLEASE review detailed and final reports for all details, in brief -     Dg Chest 2 View  Result Date: 01/24/2019 CLINICAL DATA:  Left-sided chest pain and shortness of breath. EXAM: CHEST - 2 VIEW COMPARISON:  01/01/2019 FINDINGS: Cardiomediastinal silhouette is normal. Mediastinal contours appear intact. There is no evidence of focal airspace consolidation, pleural effusion or pneumothorax. Possible mild pulmonary vascular congestion. Osseous structures are without acute abnormality. Soft tissues are grossly normal. IMPRESSION: Possible mild pulmonary vascular congestion. Electronically Signed   By: Fidela Salisbury M.D.   On: 01/24/2019 16:27   Dg Chest 2 View  Result Date:  01/01/2019 CLINICAL DATA:  Irregular heartbeat and chest pain, initial encounter EXAM: CHEST - 2 VIEW COMPARISON:  10/05/2018 FINDINGS: Cardiac shadows within normal limits. Mild vascular congestion is noted without interstitial edema. No sizable effusion or focal infiltrate is noted. Degenerative changes of lumbar spine are seen. IMPRESSION: Mild vascular congestion without interstitial edema. Electronically Signed   By: Inez Catalina M.D.   On: 01/01/2019 12:52   Ct Angio Chest Pe W And/or Wo Contrast  Result Date: 01/24/2019 CLINICAL DATA:  53 y/o F; shortness of breath and chest discomfort with exertion for 1 month. EXAM: CT ANGIOGRAPHY CHEST WITH CONTRAST TECHNIQUE: Multidetector CT imaging of the chest was performed using the standard protocol during bolus administration of intravenous contrast. Multiplanar CT image reconstructions and MIPs were obtained to evaluate the vascular anatomy. CONTRAST:  135mL ISOVUE-370 IOPAMIDOL (ISOVUE-370) INJECTION 76% COMPARISON:  None. FINDINGS: Cardiovascular: Mild cardiomegaly. Enlarged main pulmonary artery measuring 3.6 cm. Normal caliber thoracic aorta. Mild aortic calcific atherosclerosis. Satisfactory opacification of pulmonary arteries. No pulmonary embolus identified. Mediastinum/Nodes: Thyroid goiter extending into the retrosternal space. No mediastinal or axillary lymphadenopathy. Normal esophagus. Lungs/Pleura: Mosaic attenuation of the lungs. No consolidation, effusion, or pneumothorax. Upper Abdomen: No acute abnormality. Musculoskeletal: No chest wall abnormality. No acute or significant osseous findings. Multilevel discogenic degenerative changes of the thoracic spine. Review of the MIP images confirms the above findings. IMPRESSION: 1. No pulmonary embolus identified. 2. Enlarged main pulmonary artery may represent pulmonary artery hypertension. 3. Mosaic attenuation of the lungs compatible with small vessel or small airways disease. 4. Thyroid goiter  extending into retrosternal space. 5.  Aortic Atherosclerosis (ICD10-I70.0). Electronically Signed   By: Kristine Garbe M.D.   On: 01/24/2019 19:10   Vas [place] Lower Extremity Venous (dvt) (mc And Wl  7a-7p)  Result Date: 01/02/2019  Lower Venous Study Indications: Swelling.  Performing Technologist: June Leap, I  Examination Guidelines: A complete evaluation includes B-mode imaging, spectral Doppler, color Doppler, and power Doppler as needed of all accessible portions of each vessel. Bilateral testing is considered an integral part of a complete examination. Limited examinations for reoccurring indications may be performed as noted.  Left Venous Findings: +---------+---------------+---------+-----------+----------+-------+          CompressibilityPhasicitySpontaneityPropertiesSummary +---------+---------------+---------+-----------+----------+-------+ CFV      Full           Yes      Yes                          +---------+---------------+---------+-----------+----------+-------+ SFJ      Full                                                 +---------+---------------+---------+-----------+----------+-------+ FV Prox  Full                                                 +---------+---------------+---------+-----------+----------+-------+ FV Mid   Full                                                 +---------+---------------+---------+-----------+----------+-------+ FV DistalFull                                                 +---------+---------------+---------+-----------+----------+-------+ PFV      Full                                                 +---------+---------------+---------+-----------+----------+-------+ POP      Full           Yes      Yes                          +---------+---------------+---------+-----------+----------+-------+ PTV      Full                                                  +---------+---------------+---------+-----------+----------+-------+ PERO     Full                                                 +---------+---------------+---------+-----------+----------+-------+    Summary: Left: There is no evidence of deep vein thrombosis in the lower extremity. No cystic structure found in the popliteal fossa.  *See table(s) above for measurements and observations. Electronically signed by Deitra Mayo MD  on 01/02/2019 at 6:40:01 AM.    Final     Micro Results     No results found for this or any previous visit (from the past 240 hour(s)).     Today   Subjective    Shakira [name] today states that she has had difficulty getting her medications due to financial issues.  She has no primary care provider.   Objective   Blood pressure 127/75, pulse 92, temperature 99.2 F (37.3 C), temperature source Oral, resp. rate 18, height 5\' 7"  (1.702 m), weight 129.7 kg, SpO2 91 %.   Exam  Constitutional: Morbidly obese female NAD, calm, comfortable Eyes: PERRL, lids and conjunctivae normal ENMT: Mucous membranes are moist. Posterior pharynx clear of any exudate or lesions.Normal dentition.  Neck: normal, supple, no masses, no thyromegaly Respiratory: clear to auscultation bilaterally, no wheezing, no crackles. Normal respiratory effort. No accessory muscle use.  Cardiovascular: Regular rate and rhythm, no murmurs / rubs / gallops. No extremity edema. 2+ pedal pulses. No carotid bruits.  Abdomen: no tenderness, no masses palpated. No hepatosplenomegaly. Bowel sounds positive.  Musculoskeletal: no clubbing / cyanosis. No joint deformity upper and lower extremities. Good ROM, no contractures. Normal muscle tone.  Skin: no rashes, lesions, ulcers. No induration Neurologic: CN 2-12 grossly intact. Sensation intact, DTR normal. Strength 5/5 in all 4.  Psychiatric: Normal judgment and insight. Alert and oriented x 3. Normal mood.    Data Review   CBC w  Diff:  Lab Results  Component Value Date   WBC 8.0 01/25/2019   HGB 13.3 01/25/2019   HCT 43.5 01/25/2019   PLT 244 01/25/2019   LYMPHOPCT 39 03/21/2018   MONOPCT 10 03/21/2018   EOSPCT 2 03/21/2018   BASOPCT 1 03/21/2018    CMP:  Lab Results  Component Value Date   NA 135 01/25/2019   K 3.8 01/25/2019   CL 97 (L) 01/25/2019   CO2 27 01/25/2019   BUN 8 01/25/2019   CREATININE 0.53 01/25/2019   PROT 8.1 03/21/2018   ALBUMIN 4.3 03/21/2018   BILITOT 0.6 03/21/2018   ALKPHOS 53 03/21/2018   AST 23 03/21/2018   ALT 20 03/21/2018  .   Total Time in preparing paper work, data evaluation and todays exam - 35 minutes  Norval Morton M.D on 01/25/2019 at 12:44 PM  [facility]   Office  [phone]

## 2019-01-28 MED FILL — metFORMIN HCL 1000 MG TABS: 1000 | 30 days supply | Qty: 60 | Fill #0

## 2019-01-28 MED FILL — glipiZIDE 10 MG TABS: 10 | 30 days supply | Qty: 30 | Fill #0

## 2019-01-28 MED FILL — METOPROLOL SUCCINATE ER 50: 50 | 30 days supply | Qty: 30 | Fill #0

## 2019-02-12 ENCOUNTER — Ambulatory Visit: Payer: Self-pay | Admitting: Physician Assistant

## 2019-02-17 ENCOUNTER — Emergency Department (HOSPITAL_COMMUNITY): Payer: Self-pay

## 2019-02-17 ENCOUNTER — Other Ambulatory Visit: Payer: Self-pay

## 2019-02-17 ENCOUNTER — Encounter (HOSPITAL_COMMUNITY): Payer: Self-pay | Admitting: Emergency Medicine

## 2019-02-17 ENCOUNTER — Emergency Department (HOSPITAL_COMMUNITY)
Admission: EM | Admit: 2019-02-17 | Discharge: 2019-02-17 | Disposition: A | Discharge status: Home / Self Care | Payer: Self-pay | Attending: Emergency Medicine | Admitting: Emergency Medicine

## 2019-02-17 DIAGNOSIS — E119 Type 2 diabetes mellitus without complications: Secondary | ICD-10-CM | POA: Insufficient documentation

## 2019-02-17 DIAGNOSIS — M25571 Pain in right ankle and joints of right foot: Secondary | ICD-10-CM | POA: Insufficient documentation

## 2019-02-17 DIAGNOSIS — Z87891 Personal history of nicotine dependence: Secondary | ICD-10-CM | POA: Insufficient documentation

## 2019-02-17 DIAGNOSIS — Z79899 Other long term (current) drug therapy: Secondary | ICD-10-CM | POA: Insufficient documentation

## 2019-02-17 DIAGNOSIS — M25562 Pain in left knee: Secondary | ICD-10-CM | POA: Insufficient documentation

## 2019-02-17 DIAGNOSIS — Z7901 Long term (current) use of anticoagulants: Secondary | ICD-10-CM | POA: Insufficient documentation

## 2019-02-17 DIAGNOSIS — Z7984 Long term (current) use of oral hypoglycemic drugs: Secondary | ICD-10-CM | POA: Insufficient documentation

## 2019-02-17 DIAGNOSIS — G8929 Other chronic pain: Secondary | ICD-10-CM | POA: Insufficient documentation

## 2019-02-17 DIAGNOSIS — I10 Essential (primary) hypertension: Secondary | ICD-10-CM | POA: Insufficient documentation

## 2019-02-17 MED ORDER — DICLOFENAC SODIUM 1 % TD GEL: 4.0000 g | Freq: Four times a day (QID) | TRANSDERMAL | 0 refills | Status: DC | PRN

## 2019-02-17 NOTE — ED Triage Notes (Signed)
Pt from home with c/o left knee, ankle, and thigh pain x1 month intermittently.  Pt denies any injury.  Pt states she has been taking tylenol and Aleve with minimal relief.

## 2019-02-17 NOTE — ED Notes (Signed)
Patient transported to X-ray 

## 2019-02-17 NOTE — ED Provider Notes (Signed)
[facility] EMERGENCY DEPARTMENT Provider Note   CSN: [id number] Arrival date & time: 02/17/19  1030    History   Chief Complaint No chief complaint on file.   HPI Ritu Gagliardo Lee-Lovett is a 53 y.o. female with history of hypertension, A. fib on Eliquis, diabetes presenting to emergency department today with chief complaint of knee pain.  This has been a problem for the last 1 year however over the last 2 weeks the pain has worsened.  She describes the pain as a burning and aching Patient states she has pain in her left knee that radiates to her ankle and thigh.  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  Patient denies any previous injury.  She takes Tylenol and Aleve daily and has minimal relief.  Patient has not sought treatment for her knee pain prior to today.  Patient reports associated left ankle pain.  She describes the pain in her ankle as an ache. She thinks the pain is from her change in gait to compensate for the knee pain.   Denies lower extremity edema,  numbness and tingling, decreased sensation, neck pain, back pain.  History provided by patient.    Past Medical History:  Diagnosis Date  . Essential hypertension   . Fibroids   . Hyperthyroidism    a. 2010, treated w/ tapazole.  . Morbid obesity ([place])   . Paroxysmal atrial fibrillation ([place])    a. 03/2009 - in setting of hyperthyroidism and caffeine intake, converted spontaneously;  b. 08/2016 ED visit for recurrent PAF-->successful DCCV in ED.  [name] Snores   . Type II diabetes mellitus [facility])     Patient Active Problem List   Diagnosis Date Noted  . Obesity, Class III, BMI 40-49.9 (morbid obesity) ([place]) 01/27/2019  . Magnesium carbonate adverse reaction 01/27/2019  . Hypomagnesemia 01/27/2019  . Chest pain 01/24/2019  . Exertional dyspnea 01/24/2019  . Chest tightness  01/01/2019  . Type II diabetes mellitus ([place]) 01/01/2019  . Severe obstructive sleep apnea 01/14/2017  . Hypoxemia 01/14/2017  . Paroxysmal atrial fibrillation ([facility])   . Essential hypertension     Past Surgical History:  Procedure Laterality Date  . UTERINE FIBROID SURGERY       OB History   No obstetric history on file.      Home Medications    Prior to Admission medications   Medication Sig Start Date End Date Taking? Authorizing Provider  acetaminophen (TYLENOL) 500 MG tablet Take 500 mg by mouth daily.    [provider]  apixaban (ELIQUIS) 5 MG TABS tablet Take 1 tablet (5 mg total) by mouth 2 (two) times daily. 01/25/19   Norval Morton, MD  aspirin EC 81 MG tablet Take 81 mg by mouth 3 (three) times a week.    [provider]  BIOTIN PO Take 2 tablets by mouth daily.    [provider]  BLACK CURRANT SEED OIL PO Take 2 capsules by mouth daily.    [provider]  Ginkgo Biloba 40 MG TABS Take 80 mg by mouth daily.    [provider]  glipiZIDE (GLUCOTROL) 10 MG tablet Take 1 tablet (10 mg total) by mouth daily before breakfast. 01/25/19   Fuller Plan A, MD  Glucosamine HCl (GLUCOSAMINE PO) Take 2 tablets by mouth daily.  [provider]  metFORMIN (GLUCOPHAGE) 1000 MG tablet Take 1 tablet (1,000 mg total) by mouth 2 (two) times daily with a meal. 01/25/19   Norval Morton, MD  Methylsulfonylmethane (MSM PO) Take 2 tablets by mouth daily.    [provider]  metoprolol succinate (TOPROL-XL) 50 MG 24 hr tablet Take 1 tablet (50 mg total) by mouth daily. Take with or immediately following a meal. 01/26/19   Norval Morton, MD  Multiple Vitamins-Minerals (ALIVE WOMENS 50+) TABS Take 1 tablet by mouth daily.    [provider]    Family History Family History  Problem Relation Age of Onset  . Hypertension Mother   . Diabetes Mellitus II Sister   . Cancer Maternal Grandmother     Social  History Social History   Tobacco Use  . Smoking status: Former Smoker    Packs/day: 1.00    Years: 32.00    Pack years: 32.00    Last attempt to quit: 2012    Years since quitting: 8.2  . Smokeless tobacco: Never Used  . Tobacco comment: patient vapes  Substance Use Topics  . Alcohol use: No    Comment: occasional drink.  . Drug use: No     Allergies   Patient has no known allergies.   Review of Systems Review of Systems  Constitutional: Positive for activity change. Negative for chills and fever.  HENT: Negative for congestion, ear discharge, ear pain, sinus pressure, sinus pain and sore throat.   Eyes: Negative for pain and redness.  Respiratory: Negative for cough and shortness of breath.   Cardiovascular: Negative for chest pain.  Gastrointestinal: Negative for abdominal pain, constipation, diarrhea, nausea and vomiting.  Genitourinary: Negative for dysuria and hematuria.  Musculoskeletal: Positive for arthralgias, gait problem and myalgias. Negative for back pain, joint swelling, neck pain and neck stiffness.  Skin: Negative for wound.  Neurological: Negative for weakness, numbness and headaches.     Physical Exam Updated Vital Signs BP 111/63   Pulse 78   Temp 98.5 F (36.9 C) (Oral)   Resp 20   Ht 5\' 7"  (1.702 m)   Wt 127.9 kg   SpO2 93%   BMI 44.17 kg/m   Physical Exam Vitals signs and nursing note reviewed.  Constitutional:      Appearance: She is well-developed. She is not toxic-appearing.  HENT:     Head: Normocephalic and atraumatic.  Eyes:     General: No scleral icterus.       Right eye: No discharge.        Left eye: No discharge.     Conjunctiva/sclera: Conjunctivae normal.  Neck:     Musculoskeletal: Normal range of motion.     Comments: Full ROM intact without spinous process TTP. No rigidity or meningeal signs. No bruising, erythema, or swelling.  Cardiovascular:     Rate and Rhythm: Normal rate and regular rhythm.     Pulses:  Normal pulses.     Heart sounds: Normal heart sounds.  Pulmonary:     Effort: Pulmonary effort is normal.     Breath sounds: Normal breath sounds.  Abdominal:     General: There is no distension.     Palpations: Abdomen is soft.     Tenderness: There is no abdominal tenderness. There is no guarding or rebound.  Musculoskeletal:     Left hip: Normal.     Left knee: She exhibits decreased range of motion. She exhibits no swelling, no ecchymosis, no  deformity, no laceration, no erythema, no LCL laxity, no bony tenderness, normal meniscus and no MCL laxity. Tenderness found. Medial joint line tenderness noted.     Left ankle: She exhibits decreased range of motion (secondary to pain). She exhibits no swelling. No tenderness.     Comments: Patient with mildly antalgic gait secondary to pain.  She is able to bear weight on left foot. Sensation grossly intact to light touch in the lower extremities bilaterally. No saddle anesthesias. Strength 5/5 with flexion and extension at the bilateral hips, knees, and ankles. Neurovascularly intact distally. Compartments soft above and below affected joint.   Skin:    General: Skin is warm and dry.     Findings: No bruising, ecchymosis, erythema, lesion, rash or wound.  Neurological:     Mental Status: She is oriented to person, place, and time.     Comments: Fluent speech, no facial droop.  Psychiatric:        Behavior: Behavior normal.      ED Treatments / Results  Labs (all labs ordered are listed, but only abnormal results are displayed) Labs Reviewed - No data to display  EKG None  Radiology Dg Ankle Complete Left  Result Date: 02/17/2019 CLINICAL DATA:  Intermittent ankle pain over the last month. EXAM: LEFT ANKLE COMPLETE - 3+ VIEW COMPARISON:  None. FINDINGS: No visible joint effusion. No evidence of ankle joint degenerative disease or traumatic change. There is chronic calcification associated with the distal Achilles tendon. There is  some midfoot degenerative change. IMPRESSION: No acute ankle finding. Midfoot degenerative change. Calcification within the distal Achilles tendon. Electronically Signed   By: Nelson Chimes M.D.   On: 02/17/2019 12:57   Dg Knee Complete 4 Views Left  Result Date: 02/17/2019 CLINICAL DATA:  Intermittent knee and ankle pain over the last month. EXAM: LEFT KNEE - COMPLETE 4+ VIEW COMPARISON:  None. FINDINGS: There is osteoarthritis affecting the medial and lateral weight-bearing compartments in the patellofemoral joint with joint space narrowing and marginal osteophytes. There is a moderate joint effusion. No evidence of fracture. No sign of loose body. IMPRESSION: Tricompartmental osteoarthritis and joint effusion. Electronically Signed   By: Nelson Chimes M.D.   On: 02/17/2019 12:56    Procedures Procedures (including critical care time)  Medications Ordered in ED Medications - No data to display   Initial Impression / Assessment and Plan / ED Course  I have reviewed the triage vital signs and the nursing notes.  Pertinent labs & imaging results that were available during my care of the patient were reviewed by me and considered in my medical decision making (see chart for details).  Pt is afebrile, non toxic appearing. Patient presents to the ED with complaints of pain to the left knee x 1 year with worsening pain x 1 week. Exam without obvious deformity or open wounds. The knee is not warm to touch and is without erythema. ROM intact. Tender to palpation over patella. NVI distally. Xray viewed by me is negative for fracture/dislocation. It does show osteoarthritis and moderate joint effusion. Therapeutic splint provided.  I  Discussed use of NSAIDs and blood thinners at length with pt and advised her to discontinue Aleve.  Patient has an appointment scheduled with the [facility] health clinic on Tuesday.  Advised her to keep this appointment scheduled.  Also given her information for orthopedics for  follow-up.  Will discharge home with prescription for Diclofenac gel.  Patient is hemodynamically stable, in NAD, and able to ambulate  in the ED. Evaluation does not show pathology that would require ongoing emergent intervention or inpatient treatment. I explained the diagnosis to the patient.  Patient is comfortable with above plan and is stable for discharge at this time. All questions were answered prior to disposition. Strict return precautions for returning to the ED were discussed.  Pt case discussed with Dr. Maryan Rued who agrees with my plan.   This note was prepared with assistance of Systems analyst. Occasional wrong-word or sound-a-like substitutions may have occurred due to the inherent limitations of voice recognition software.  Final Clinical Impressions(s) / ED Diagnoses   Final diagnoses:  Chronic pain of left knee    ED Discharge Orders    None       Flint Melter 02/17/19 1512    Blanchie Dessert, MD 02/17/19 1605

## 2019-02-17 NOTE — Discharge Instructions (Addendum)
It is important that you stop taking Aleve and all NSAIDs including ibuprofen, motrin, Advil.  You are currently taking Eliquis which is a blood thinning medication. If you take a blood thinner and an NSAID you increase your risk of bleeding significantly.   Your xray today shows osteoarthritis. I have sent a prescription to your pharmacy for Diclofenac gel. Please can use this as directed.   I have attached some information about osteoarthritis for you to read and learn more about it. It suggest some treatments you can try at home including heat or ice. You should wear the brace to help with discomfort, especially while at work.  I have also included information for an orthopedist. You can call to schedule a follow up appointment if needed.   Follow Up:  -Please follow up with the Clinic with the appointment you have scheduled this week.  -Please followup with orthopedics  in 1 week if no improvement for discussion of your diagnoses and further evaluation after today's visit    Please return to the ER for worsening symptoms or other concerns

## 2019-02-17 NOTE — ED Notes (Signed)
Discharge instructions (including medications) discussed with and copy provided to patient/caregiver.  Pt stable, ambulatory,and verbalizes understanding of d/c instructions.    Pt unable to sign due to lack of signature pad available.

## 2019-02-19 ENCOUNTER — Ambulatory Visit: Payer: Self-pay | Attending: Critical Care Medicine | Admitting: Critical Care Medicine

## 2019-02-19 ENCOUNTER — Other Ambulatory Visit: Payer: Self-pay

## 2019-02-19 ENCOUNTER — Encounter: Payer: Self-pay | Admitting: Critical Care Medicine

## 2019-02-19 VITALS — BP 112/75 | HR 68 | Temp 99.0°F | Resp 18 | Ht 67.0 in | Wt 280.0 lb

## 2019-02-19 DIAGNOSIS — Z1211 Encounter for screening for malignant neoplasm of colon: Secondary | ICD-10-CM

## 2019-02-19 DIAGNOSIS — I10 Essential (primary) hypertension: Secondary | ICD-10-CM

## 2019-02-19 DIAGNOSIS — E1169 Type 2 diabetes mellitus with other specified complication: Secondary | ICD-10-CM

## 2019-02-19 DIAGNOSIS — R0902 Hypoxemia: Secondary | ICD-10-CM

## 2019-02-19 DIAGNOSIS — Z7901 Long term (current) use of anticoagulants: Secondary | ICD-10-CM | POA: Insufficient documentation

## 2019-02-19 DIAGNOSIS — Z1239 Encounter for other screening for malignant neoplasm of breast: Secondary | ICD-10-CM | POA: Insufficient documentation

## 2019-02-19 DIAGNOSIS — I480 Paroxysmal atrial fibrillation: Secondary | ICD-10-CM

## 2019-02-19 DIAGNOSIS — G4733 Obstructive sleep apnea (adult) (pediatric): Secondary | ICD-10-CM

## 2019-02-19 DIAGNOSIS — E7800 Pure hypercholesterolemia, unspecified: Secondary | ICD-10-CM | POA: Insufficient documentation

## 2019-02-19 DIAGNOSIS — E049 Nontoxic goiter, unspecified: Secondary | ICD-10-CM | POA: Insufficient documentation

## 2019-02-19 LAB — GLUCOSE, POCT (MANUAL RESULT ENTRY): POC Glucose: 304 mg/dl — AB (ref 70–99)

## 2019-02-19 MED ORDER — METFORMIN HCL 1000 MG PO TABS: 1000.0000 mg | ORAL_TABLET | Freq: Two times a day (BID) | ORAL | 3 refills | Status: DC

## 2019-02-19 MED ORDER — TRUE METRIX METER W/DEVICE KIT: PACK | 0 refills | Status: AC

## 2019-02-19 MED ORDER — APIXABAN 5 MG PO TABS: 5.0000 mg | ORAL_TABLET | Freq: Two times a day (BID) | ORAL | 3 refills | Status: DC

## 2019-02-19 MED ORDER — DICLOFENAC SODIUM 1 % TD GEL: 4.0000 g | Freq: Four times a day (QID) | TRANSDERMAL | 3 refills | Status: DC | PRN

## 2019-02-19 MED ORDER — TRUEPLUS LANCETS 28G MISC: 1 refills | Status: DC

## 2019-02-19 MED ORDER — GLIPIZIDE 10 MG PO TABS: 10.0000 mg | ORAL_TABLET | Freq: Every day | ORAL | 3 refills | Status: DC

## 2019-02-19 MED ORDER — METOPROLOL SUCCINATE ER 50 MG PO TB24: 50.0000 mg | ORAL_TABLET | Freq: Every day | ORAL | 3 refills | Status: DC

## 2019-02-19 MED ORDER — GLUCOSE BLOOD VI STRP: ORAL_STRIP | 12 refills | Status: DC

## 2019-02-19 MED FILL — TRUE METRIX TEST STRIP: 50 days supply | Qty: 100 | Fill #0

## 2019-02-19 MED FILL — !ELIQUIS 5MG TABLET: 5 | 30 days supply | Qty: 60 | Fill #0

## 2019-02-19 MED FILL — DICLOFENAC SODIUM 1% GEL: 1 | 6 days supply | Qty: 100 | Fill #0

## 2019-02-19 MED FILL — !TRUE METRIX BLOOD GLUCOSE: 1 days supply | Qty: 1 | Fill #0

## 2019-02-19 MED FILL — METOPROLOL SUCC ER 50 MG TA: 50 | 30 days supply | Qty: 30 | Fill #0

## 2019-02-19 MED FILL — TRUEplus LANCETS 28G MISC: 50 days supply | Qty: 100 | Fill #0

## 2019-02-19 NOTE — Patient Instructions (Addendum)
Stop aspirin Refills on Eliquis, metoprolol, glipizide, metformin, and Voltaren gel were sent to our pharmacy  We will have you apply for patient assistance program for Eliquis  A Pneumovax and tetanus vaccine were administered today  Labs today will include complete metabolic panel, complete blood count, thyroid profile, and lipid panel, magnesium level, urine for microalbumin.  Follow diabetic diet as outlined below and note we have establish an appointment with our clinical pharmacist in the next 2 weeks to discuss your diabetes management  A prescription for a glucose meter and testing supplies strips and supplies was sent to our pharmacy for your use please check your blood sugar at least twice daily at meals and bring a record of these values with your next visit  A mammogram was scheduled and a stool card to check for occult blood was given to you today to return  Please schedule an eye doctor appointment to have your eyes checked with your diabetes   A primary care follow-up visit will be made so that we can do a Pap smear  We will have you fill out financial paperwork so you can reestablish an orange card and blue card for specialty consultation with orthopedics and to obtain another sleep study    Diabetes Mellitus and Nutrition, Adult When you have diabetes (diabetes mellitus), it is very important to have healthy eating habits because your blood sugar (glucose) levels are greatly affected by what you eat and drink. Eating healthy foods in the appropriate amounts, at about the same times every day, can help you:  Control your blood glucose.  Lower your risk of heart disease.  Improve your blood pressure.  Reach or maintain a healthy weight. Every person with diabetes is different, and each person has different needs for a meal plan. Your health care provider may recommend that you work with a diet and nutrition specialist (dietitian) to make a meal plan that is best for  you. Your meal plan may vary depending on factors such as:  The calories you need.  The medicines you take.  Your weight.  Your blood glucose, blood pressure, and cholesterol levels.  Your activity level.  Other health conditions you have, such as heart or kidney disease. How do carbohydrates affect me? Carbohydrates, also called carbs, affect your blood glucose level more than any other type of food. Eating carbs naturally raises the amount of glucose in your blood. Carb counting is a method for keeping track of how many carbs you eat. Counting carbs is important to keep your blood glucose at a healthy level, especially if you use insulin or take certain oral diabetes medicines. It is important to know how many carbs you can safely have in each meal. This is different for every person. Your dietitian can help you calculate how many carbs you should have at each meal and for each snack. Foods that contain carbs include:  Bread, cereal, rice, pasta, and crackers.  Potatoes and corn.  Peas, beans, and lentils.  Milk and yogurt.  Fruit and juice.  Desserts, such as cakes, cookies, ice cream, and candy. How does alcohol affect me? Alcohol can cause a sudden decrease in blood glucose (hypoglycemia), especially if you use insulin or take certain oral diabetes medicines. Hypoglycemia can be a life-threatening condition. Symptoms of hypoglycemia (sleepiness, dizziness, and confusion) are similar to symptoms of having too much alcohol. If your health care provider says that alcohol is safe for you, follow these guidelines:  Limit alcohol intake to  no more than 1 drink per day for nonpregnant women and 2 drinks per day for men. One drink equals 12 oz of beer, 5 oz of wine, or 1 oz of hard liquor.  Do not drink on an empty stomach.  Keep yourself hydrated with water, diet soda, or unsweetened iced tea.  Keep in mind that regular soda, juice, and other mixers may contain a lot of sugar  and must be counted as carbs. What are tips for following this plan?  Reading food labels  Start by checking the serving size on the "Nutrition Facts" label of packaged foods and drinks. The amount of calories, carbs, fats, and other nutrients listed on the label is based on one serving of the item. Many items contain more than one serving per package.  Check the total grams (g) of carbs in one serving. You can calculate the number of servings of carbs in one serving by dividing the total carbs by 15. For example, if a food has 30 g of total carbs, it would be equal to 2 servings of carbs.  Check the number of grams (g) of saturated and trans fats in one serving. Choose foods that have low or no amount of these fats.  Check the number of milligrams (mg) of salt (sodium) in one serving. Most people should limit total sodium intake to less than 2,300 mg per day.  Always check the nutrition information of foods labeled as "low-fat" or "nonfat". These foods may be higher in added sugar or refined carbs and should be avoided.  Talk to your dietitian to identify your daily goals for nutrients listed on the label. Shopping  Avoid buying canned, premade, or processed foods. These foods tend to be high in fat, sodium, and added sugar.  Shop around the outside edge of the grocery store. This includes fresh fruits and vegetables, bulk grains, fresh meats, and fresh dairy. Cooking  Use low-heat cooking methods, such as baking, instead of high-heat cooking methods like deep frying.  Cook using healthy oils, such as olive, canola, or sunflower oil.  Avoid cooking with butter, cream, or high-fat meats. Meal planning  Eat meals and snacks regularly, preferably at the same times every day. Avoid going long periods of time without eating.  Eat foods high in fiber, such as fresh fruits, vegetables, beans, and whole grains. Talk to your dietitian about how many servings of carbs you can eat at each  meal.  Eat 4-6 ounces (oz) of lean protein each day, such as lean meat, chicken, fish, eggs, or tofu. One oz of lean protein is equal to: ? 1 oz of meat, chicken, or fish. ? 1 egg. ?  cup of tofu.  Eat some foods each day that contain healthy fats, such as avocado, nuts, seeds, and fish. Lifestyle  Check your blood glucose regularly.  Exercise regularly as told by your health care provider. This may include: ? 150 minutes of moderate-intensity or vigorous-intensity exercise each week. This could be brisk walking, biking, or water aerobics. ? Stretching and doing strength exercises, such as yoga or weightlifting, at least 2 times a week.  Take medicines as told by your health care provider.  Do not use any products that contain nicotine or tobacco, such as cigarettes and e-cigarettes. If you need help quitting, ask your health care provider.  Work with a Social worker or diabetes educator to identify strategies to manage stress and any emotional and social challenges. Questions to ask a health care provider  Do I need to meet with a diabetes educator?  Do I need to meet with a dietitian?  What number can I call if I have questions?  When are the best times to check my blood glucose? Where to find more information:  American Diabetes Association: diabetes.org  Academy of Nutrition and Dietetics: www.eatright.[employer] of Diabetes and Digestive and Kidney Diseases (NIH): [employer] Summary  A healthy meal plan will help you control your blood glucose and maintain a healthy lifestyle.  Working with a diet and nutrition specialist (dietitian) can help you make a meal plan that is best for you.  Keep in mind that carbohydrates (carbs) and alcohol have immediate effects on your blood glucose levels. It is important to count carbs and to use alcohol carefully. This information is not intended to replace advice given to you by your health care provider. Make sure  you discuss any questions you have with your health care provider. Document Released: 08/18/2005 Document Revised: 06/21/2017 Document Reviewed: 12/26/2016 Elsevier Interactive Patient Education  2019 [employer].

## 2019-02-19 NOTE — Assessment & Plan Note (Signed)
Will provide patient assistance for the patient to maintain Eliquis and will discontinue aspirin now that she is successfully on the Eliquis at 5 mg twice daily

## 2019-02-19 NOTE — Assessment & Plan Note (Signed)
Patient has ongoing severe obstructive sleep apnea and will need to be reassessed with another sleep study however she needs financial assistance before we can proceed with this examination

## 2019-02-19 NOTE — Assessment & Plan Note (Signed)
Hypertension is well controlled at this time we will therefore maintain the metoprolol at 50 mg daily

## 2019-02-19 NOTE — Assessment & Plan Note (Signed)
Substernal goiter was noted on CT scan of the chest however there is no active symptom complex although the patient does have a history of hyper thyroidism  Will obtain thyroid panel today The patient may yet need a thyroid ultrasound

## 2019-02-19 NOTE — Assessment & Plan Note (Signed)
Symptom complex of hypoxemia, chest tightness, and chest pain have resolved with control of heart rate  We will continue metoprolol for now and have encouraged the patient to follow-up with cardiology

## 2019-02-19 NOTE — Assessment & Plan Note (Signed)
Hypomagnesemia detected at the last hospitalization we will recheck magnesium level now

## 2019-02-19 NOTE — Assessment & Plan Note (Signed)
We will obtain a stool card to check for fecal Hemoccult

## 2019-02-19 NOTE — Assessment & Plan Note (Signed)
Will repeat lipid levels and consider beginning statin therapy

## 2019-02-19 NOTE — Assessment & Plan Note (Signed)
Screening mammogram will be scheduled

## 2019-02-19 NOTE — Assessment & Plan Note (Signed)
Patient has type 2 diabetes without evidence of complication at this time  We will obtain a urine microalbumin and note a recent hemoglobin A1c was 10.1  We will have the patient visit with our clinical pharmacist and maintain metformin at thousand milligrams twice daily and glipizide 10 mg daily  I suspect this patient will need long-acting insulin  We reviewed a diabetic diet with this patient as well  Primary care follow-up will also be established  Glucose testing supplies were given to the patient today

## 2019-02-19 NOTE — Assessment & Plan Note (Signed)
Significant morbid obesity which is contributed the patient's symptom complex  Diabetic diet for pursuit of weight loss was reviewed with the patient at this visit

## 2019-02-19 NOTE — Progress Notes (Signed)
Subjective:    Patient ID: Haley [name], female    DOB: 1966-08-23, 53 y.o.   MRN: [id number]  This is a 53 year old female with history of hypertension, paroxysmal atrial fibrillation, severe sleep apnea, type 2 diabetes, exertional dyspnea, morbid obesity, hypomagnesemia.  The patient had run out of her blood pressure and diabetic medications and presented to the hospital on 20 February with chest pain and rapid heart rate with mild hypoxemia.  During this admission her heart rate was controlled and study showed a CT angios negative for pulmonary emboli but mild vascular congestion.  Echocardiogram showed normal ejection fraction with mild left ventricular hypertrophy.  There was no venous Doppler findings for DVT.  The patient was discharged on beta-blockade and to continue Eliquis.  She had not been able to afford the Eliquis previously.  Also her diabetic medications were resumed and her magnesium was replaced.  Note during this hospital visit she was in sinus tachycardia and not in atrial fibrillation  The patient did return to the emergency room on 15 March for knee pain.  At that visit she was found to have significant tricompartment osteoarthritis of the left knee and some arthritis in the midfoot on the left side.  The patient comes today to the office for establishing primary care and post hospital post ER follow-up.  The patient states that since she has been back on her blood pressure and diabetic medications her breathing is much better.  Note the CT angios did incidentally find substernal thyroid goiter.  TSH done was normal during the hospitalization.  She does have a history of hyperthyroidism in the past.  A full thyroid panel had not yet been obtained.  Note the patient struggles with following a diabetic diet.  Also note the patient is a non-smoker.  There are multiple care gaps identified this visit which include the patient needs a tetanus and pneumonia vaccine, colon  cancer screening, mammogram, eye exam, and foot exam.  Note the patient's blood sugar on arrival was 304.  She did have a cardiology appointment in February but she failed to make that she now has an appointment scheduled for 27 March.  She used to have an orange card when she was followed at [facility].  She no longer has the orange card.  Her CPAP machine no longer is working it was a machine she borrowed from another individual.  She is in need for reassessment of her sleep apnea.    Past Medical History:  Diagnosis Date  . Essential hypertension   . Fibroids   . Hyperthyroidism    a. 2010, treated w/ tapazole.  . Morbid obesity ([place])   . Paroxysmal atrial fibrillation ([place])    a. 03/2009 - in setting of hyperthyroidism and caffeine intake, converted spontaneously;  b. 08/2016 ED visit for recurrent PAF-->successful DCCV in ED.  [name] Snores   . Type II diabetes mellitus (HCC)      Family History  Problem Relation Age of Onset  . Hypertension Mother   . Diabetes Mellitus II Sister   . Cancer Maternal Grandmother      Social History   Socioeconomic History  . Marital status: Married    Spouse name: Not on file  . Number of children: Not on file  . Years of education: Not on file  . Highest education level: Not on file  Occupational History  . Occupation: Theatre manager  Social Needs  . Financial resource strain: Not on file  .  Food insecurity:    Worry: Not on file    Inability: Not on file  . Transportation needs:    Medical: Not on file    Non-medical: Not on file  Tobacco Use  . Smoking status: Former Smoker    Packs/day: 1.00    Years: 32.00    Pack years: 32.00    Last attempt to quit: 2012    Years since quitting: 8.2  . Smokeless tobacco: Never Used  . Tobacco comment: patient vapes  Substance and Sexual Activity  . Alcohol use: No    Comment: occasional drink.  . Drug use: No  . Sexual activity: Yes  Lifestyle  . Physical activity:     Days per week: Not on file    Minutes per session: Not on file  . Stress: Not on file  Relationships  . Social connections:    Talks on phone: Not on file    Gets together: Not on file    Attends religious service: Not on file    Active member of club or organization: Not on file    Attends meetings of clubs or organizations: Not on file    Relationship status: Not on file  . Intimate partner violence:    Fear of current or ex partner: Not on file    Emotionally abused: Not on file    Physically abused: Not on file    Forced sexual activity: Not on file  Other Topics Concern  . Not on file  Social History Narrative   Lives in [place] with husband.  Systems analyst.  Also in school @ [employer] for [employer].  Does not routinely exercise.     No Known Allergies   Outpatient Medications Prior to Visit  Medication Sig Dispense Refill  . acetaminophen (TYLENOL) 500 MG tablet Take 500 mg by mouth daily.    [name] BIOTIN PO Take 2 tablets by mouth daily.    [name] BLACK CURRANT SEED OIL PO Take 2 capsules by mouth daily.    . Ginkgo Biloba 40 MG TABS Take 80 mg by mouth daily.    . Glucosamine HCl (GLUCOSAMINE PO) Take 2 tablets by mouth daily.    . Methylsulfonylmethane (MSM PO) Take 2 tablets by mouth daily.    . Multiple Vitamins-Minerals (ALIVE WOMENS 50+) TABS Take 1 tablet by mouth daily.    [name] apixaban (ELIQUIS) 5 MG TABS tablet Take 1 tablet (5 mg total) by mouth 2 (two) times daily. 60 tablet 0  . aspirin EC 81 MG tablet Take 81 mg by mouth 3 (three) times a week.    [name] glipiZIDE (GLUCOTROL) 10 MG tablet Take 1 tablet (10 mg total) by mouth daily before breakfast. 30 tablet 0  . metFORMIN (GLUCOPHAGE) 1000 MG tablet Take 1 tablet (1,000 mg total) by mouth 2 (two) times daily with a meal. 60 tablet 0  . metoprolol succinate (TOPROL-XL) 50 MG 24 hr tablet Take 1 tablet (50 mg total) by mouth daily. Take with or immediately following a meal. 30 tablet 0  . diclofenac sodium (VOLTAREN)  1 % GEL Apply 4 g topically 4 (four) times daily as needed. (Patient not taking: Reported on 02/19/2019) 100 g 0   No facility-administered medications prior to visit.     Review of Systems  Constitutional: Negative.   HENT: Negative.   Eyes: Negative.   Respiratory: Negative for cough, choking, chest tightness, shortness of breath and wheezing.   Cardiovascular: Negative for chest pain, palpitations and leg  swelling.  Gastrointestinal: Negative.   Endocrine: Negative.   Genitourinary: Negative.   Musculoskeletal: Positive for joint swelling.       Joint pain  Skin: Negative for rash.  Neurological: Negative.   Hematological: Negative.   Psychiatric/Behavioral: Negative for hallucinations, self-injury, sleep disturbance and suicidal ideas. The patient is nervous/anxious. The patient is not hyperactive.        Objective:   Physical Exam Vitals:   02/19/19 1021  BP: 112/75  Pulse: 68  Resp: 18  Temp: 99 F (37.2 C)  TempSrc: Oral  SpO2: 97%  Weight: 280 lb (127 kg)  Height: '5\' 7"'$  (1.702 m)    Gen: Pleasant,obese,  in no distress,  normal affect  ENT: No lesions,  mouth clear,  oropharynx clear, no postnasal drip  Neck: No JVD, no TMG, no carotid bruits  Lungs: No use of accessory muscles, no dullness to percussion, clear without rales or rhonchi  Cardiovascular: RRR, heart sounds normal, no murmur or gallops, no peripheral edema  Abdomen: soft and NT, no HSM,  BS normal  Musculoskeletal: No deformities, no cyanosis or clubbing  Neuro: alert, non focal  Skin: Warm, no lesions or rashes Foot exam was normal  2/20 CT Angio Chest: IMPRESSION: 1. No pulmonary embolus identified. 2. Enlarged main pulmonary artery may represent pulmonary artery hypertension. 3. Mosaic attenuation of the lungs compatible with small vessel or small airways disease. 4. Thyroid goiter extending into retrosternal space. 5.  Aortic Atherosclerosis (ICD10-I70.0).      Assessment &  Plan:  I personally reviewed all images and lab data in the [facility] system as well as any outside material available during this office visit and agree with the  radiology impressions.   Paroxysmal atrial fibrillation (HCC) Paroxysmal atrial fibrillation which actually did not recur during the February 2020 hospitalization.  At that time the patient was in sinus tachycardia.  She had a cardioversion procedure done in September 2017 for recurrent paroxysmal atrial fibrillation and now has maintained sinus rhythm.  Cardiology recommended increasing Toprol to 50 mg daily in February 2020 in resuming Eliquis for now as she has an elevated [place] vasc2 score  Essential hypertension Hypertension is well controlled at this time we will therefore maintain the metoprolol at 50 mg daily  Severe obstructive sleep apnea Patient has ongoing severe obstructive sleep apnea and will need to be reassessed with another sleep study however she needs financial assistance before we can proceed with this examination  Type II diabetes mellitus ([place]) Patient has type 2 diabetes without evidence of complication at this time  We will obtain a urine microalbumin and note a recent hemoglobin A1c was 10.1  We will have the patient visit with our clinical pharmacist and maintain metformin at thousand milligrams twice daily and glipizide 10 mg daily  I suspect this patient will need long-acting insulin  We reviewed a diabetic diet with this patient as well  Primary care follow-up will also be established  Glucose testing supplies were given to the patient today  Substernal thyroid goiter Substernal goiter was noted on CT scan of the chest however there is no active symptom complex although the patient does have a history of hyper thyroidism  Will obtain thyroid panel today The patient may yet need a thyroid ultrasound  Hypoxemia Symptom complex of hypoxemia, chest tightness, and chest pain have resolved with control  of heart rate  We will continue metoprolol for now and have encouraged the patient to follow-up with cardiology  Obesity,  Class III, BMI 40-49.9 (morbid obesity) ([place]) Significant morbid obesity which is contributed the patient's symptom complex  Diabetic diet for pursuit of weight loss was reviewed with the patient at this visit  Hypomagnesemia Hypomagnesemia detected at the last hospitalization we will recheck magnesium level now  Current use of long term anticoagulation Will provide patient assistance for the patient to maintain Eliquis and will discontinue aspirin now that she is successfully on the Eliquis at 5 mg twice daily  Colon cancer screening We will obtain a stool card to check for fecal Hemoccult  Pure hypercholesterolemia Will repeat lipid levels and consider beginning statin therapy  Breast cancer screening Screening mammogram will be scheduled   Darshay was seen today for hospitalization follow-up.  Diagnoses and all orders for this visit:  Type 2 diabetes mellitus with other specified complication, without long-term current use of insulin (HCC) -     Glucose (CBG) -     Comprehensive metabolic panel  Essential hypertension -     Comprehensive metabolic panel -     CBC with Differential/Platelet; Future -     CBC with Differential/Platelet  Severe obstructive sleep apnea  Paroxysmal atrial fibrillation (HCC) -     CBC with Differential/Platelet; Future -     CBC with Differential/Platelet  Obesity, Class III, BMI 40-49.9 (morbid obesity) (HCC)  Hypomagnesemia -     Magnesium; Future -     Magnesium  Substernal thyroid goiter -     Thyroid Profile  Current use of long term anticoagulation -     CBC with Differential/Platelet; Future -     Fecal occult blood, imunochemical -     CBC with Differential/Platelet  Colon cancer screening -     Microalbumin, urine  Breast cancer screening -     MM DIGITAL SCREENING BILATERAL; Future  Pure  hypercholesterolemia -     Lipid panel  Hypoxemia  Other orders -     metoprolol succinate (TOPROL-XL) 50 MG 24 hr tablet; Take 1 tablet (50 mg total) by mouth daily. Take with or immediately following a meal. -     metFORMIN (GLUCOPHAGE) 1000 MG tablet; Take 1 tablet (1,000 mg total) by mouth 2 (two) times daily with a meal. -     glipiZIDE (GLUCOTROL) 10 MG tablet; Take 1 tablet (10 mg total) by mouth daily before breakfast. -     diclofenac sodium (VOLTAREN) 1 % GEL; Apply 4 g topically 4 (four) times daily as needed. -     apixaban (ELIQUIS) 5 MG TABS tablet; Take 1 tablet (5 mg total) by mouth 2 (two) times daily. -     Blood Glucose Monitoring Suppl (TRUE METRIX METER) w/Device KIT; Use twice daily -     glucose blood (TRUE METRIX BLOOD GLUCOSE TEST) test strip; Use as instructed -     TRUEplus Lancets 28G MISC; Use twice daily for blood glucose check  Note a Pneumovax and tetanus vaccine were administered

## 2019-02-19 NOTE — Assessment & Plan Note (Signed)
Paroxysmal atrial fibrillation which actually did not recur during the February 2020 hospitalization.  At that time the patient was in sinus tachycardia.  She had a cardioversion procedure done in September 2017 for recurrent paroxysmal atrial fibrillation and now has maintained sinus rhythm.  Cardiology recommended increasing Toprol to 50 mg daily in February 2020 in resuming Eliquis for now as she has an elevated [place] vasc2 score

## 2019-02-20 LAB — COMPREHENSIVE METABOLIC PANEL
ALT: 13 IU/L (ref 0–32)
AST: 14 IU/L (ref 0–40)
Albumin/Globulin Ratio: 1.6 (ref 1.2–2.2)
Albumin: 4.5 g/dL (ref 3.8–4.9)
Alkaline Phosphatase: 58 IU/L (ref 39–117)
BUN/Creatinine Ratio: 19 (ref 9–23)
BUN: 10 mg/dL (ref 6–24)
Bilirubin Total: 0.3 mg/dL (ref 0.0–1.2)
CALCIUM: 10 mg/dL (ref 8.7–10.2)
CHLORIDE: 99 mmol/L (ref 96–106)
CO2: 24 mmol/L (ref 20–29)
Creatinine, Ser: 0.54 mg/dL — ABNORMAL LOW (ref 0.57–1.00)
GFR calc Af Amer: 125 mL/min/{1.73_m2} (ref >59)
GFR calc non Af Amer: 109 mL/min/{1.73_m2} (ref >59)
Globulin, Total: 2.8 g/dL (ref 1.5–4.5)
Glucose: 282 mg/dL — ABNORMAL HIGH (ref 65–99)
Potassium: 4.4 mmol/L (ref 3.5–5.2)
Sodium: 139 mmol/L (ref 134–144)
Total Protein: 7.3 g/dL (ref 6.0–8.5)

## 2019-02-20 LAB — CBC WITH DIFFERENTIAL/PLATELET
Basophils Absolute: 0.1 10*3/uL (ref 0.0–0.2)
Basos: 1 %
EOS (ABSOLUTE): 0.2 10*3/uL (ref 0.0–0.4)
Eos: 2 %
Hematocrit: 42.6 % (ref 34.0–46.6)
Hemoglobin: 14.1 g/dL (ref 11.1–15.9)
Immature Grans (Abs): 0 10*3/uL (ref 0.0–0.1)
Immature Granulocytes: 0 %
LYMPHS: 36 %
Lymphocytes Absolute: 2.7 10*3/uL (ref 0.7–3.1)
MCH: 30.5 pg (ref 26.6–33.0)
MCHC: 33.1 g/dL (ref 31.5–35.7)
MCV: 92 fL (ref 79–97)
Monocytes Absolute: 0.7 10*3/uL (ref 0.1–0.9)
Monocytes: 9 %
Neutrophils Absolute: 4 10*3/uL (ref 1.4–7.0)
Neutrophils: 52 %
Platelets: 244 10*3/uL (ref 150–450)
RBC: 4.63 x10E6/uL (ref 3.77–5.28)
RDW: 11.6 % — ABNORMAL LOW (ref 11.7–15.4)
WBC: 7.7 10*3/uL (ref 3.4–10.8)

## 2019-02-20 LAB — THYROID PANEL
Free Thyroxine Index: 1.9 (ref 1.2–4.9)
T3 Uptake Ratio: 32 % (ref 24–39)
T4, Total: 6 ug/dL (ref 4.5–12.0)

## 2019-02-20 LAB — LIPID PANEL
Chol/HDL Ratio: 3.7 ratio (ref 0.0–4.4)
Cholesterol, Total: 187 mg/dL (ref 100–199)
HDL: 51 mg/dL (ref >39)
LDL Calculated: 119 mg/dL — ABNORMAL HIGH (ref 0–99)
Triglycerides: 87 mg/dL (ref 0–149)
VLDL Cholesterol Cal: 17 mg/dL (ref 5–40)

## 2019-02-20 LAB — MICROALBUMIN, URINE: Microalbumin, Urine: 439.5 ug/mL

## 2019-02-20 LAB — MAGNESIUM: Magnesium: 1.6 mg/dL (ref 1.6–2.3)

## 2019-02-22 ENCOUNTER — Telehealth: Payer: Self-pay | Admitting: Critical Care Medicine

## 2019-02-22 DIAGNOSIS — E049 Nontoxic goiter, unspecified: Secondary | ICD-10-CM

## 2019-02-22 MED ORDER — ATORVASTATIN CALCIUM 20 MG PO TABS: 20.0000 mg | ORAL_TABLET | Freq: Every day | ORAL | 3 refills | Status: DC

## 2019-02-22 MED FILL — ATORVASTATIN 20 MG TABLET: 20 | 30 days supply | Qty: 30 | Fill #0

## 2019-02-22 NOTE — Telephone Encounter (Signed)
Patient verified DOB Patient is aware of kidney function and thyroid being normal. Patient is aware of atorvastatin being sent to the pharmacy for elevated lipid panel. Patient is aware of [place] for thyroid goiter being placed and not scheduled until 4 weeks from now. Patient is scheduled with CPP on 03/05/19 to discuss DM. MA reminded patient to take readings at home and bring to that visit. No further questions at this time.

## 2019-02-22 NOTE — Telephone Encounter (Signed)
I have tried to reach Ms Haley [name] with no success,  Can you continue to try to reach her?  Tell her the kidney function is normal, the lipid panel is abnormal and she needs to start atorvastatin and I sent Rx to our pharmacy  Her thyroid function is normal and due to her goiter I have ordered a thyroid u/s , note this might get delayed d/t COVID and reduction for 4 weeks of elective radiology studies and that is ok   She needs a visit with Haley [name] to discuss her DM as her microalbumin is high in the urine as is her BS.  Will need med adjustments to DM care .

## 2019-02-28 ENCOUNTER — Telehealth: Payer: Self-pay | Admitting: *Deleted

## 2019-02-28 ENCOUNTER — Telehealth: Payer: Self-pay | Admitting: Physician Assistant

## 2019-02-28 LAB — FECAL OCCULT BLOOD, IMMUNOCHEMICAL: FECAL OCCULT BLD: NEGATIVE

## 2019-02-28 NOTE — Telephone Encounter (Signed)
   Primary Cardiologist: [name], MD   Pt contacted.  History and symptoms reviewed.  Pt will f[facility]u with [facility] provider as scheduled.  Pt. advised that we are restricting visitors at this time and request that only patients present for check-in prior to their appointment.  All other visitors should remain in their car.  If necessary, only one visitor may come with the patient, into the building. For everyone's safety, all patients and visitor entering our practice area should expect to be screened again prior to entering our waiting area.  Patient is symptomatic with chest discomfort, will keep appointment for tomorrow.   [place], [place]  02/28/2019 2:56 PM

## 2019-02-28 NOTE — Telephone Encounter (Signed)
-----   Message from Elsie Stain, MD sent at 02/28/2019 10:14 AM EDT ----- Leodis Sias,  pls let ms [facility] lovett know her colon cancer screen test was neg, low risk for colon cancer

## 2019-02-28 NOTE — Telephone Encounter (Signed)
MA unable to reach patient and no voice mail option available.

## 2019-03-01 ENCOUNTER — Ambulatory Visit (INDEPENDENT_AMBULATORY_CARE_PROVIDER_SITE_OTHER): Payer: Self-pay | Admitting: Physician Assistant

## 2019-03-01 ENCOUNTER — Encounter: Payer: Self-pay | Admitting: Physician Assistant

## 2019-03-01 ENCOUNTER — Encounter

## 2019-03-01 ENCOUNTER — Other Ambulatory Visit: Payer: Self-pay

## 2019-03-01 VITALS — BP 120/62 | HR 80 | Ht 67.0 in | Wt 283.8 lb

## 2019-03-01 DIAGNOSIS — I10 Essential (primary) hypertension: Secondary | ICD-10-CM

## 2019-03-01 DIAGNOSIS — I480 Paroxysmal atrial fibrillation: Secondary | ICD-10-CM

## 2019-03-01 DIAGNOSIS — R0789 Other chest pain: Secondary | ICD-10-CM

## 2019-03-01 DIAGNOSIS — G4733 Obstructive sleep apnea (adult) (pediatric): Secondary | ICD-10-CM

## 2019-03-01 DIAGNOSIS — E119 Type 2 diabetes mellitus without complications: Secondary | ICD-10-CM

## 2019-03-01 MED ORDER — METOPROLOL SUCCINATE ER 50 MG PO TB24: ORAL_TABLET | ORAL | 2 refills | Status: DC

## 2019-03-01 NOTE — Patient Instructions (Addendum)
Medication Instructions:   TAKE METOPROLOL SUCCINATE XL 50 MG (1 TABLET) in the mornings and 25 MG (half tablet) in the evenings   If you need a refill on your cardiac medications before your next appointment, please call your pharmacy.   Lab work:  NONE  If you have labs (blood work) drawn today and your tests are completely normal, you will receive your results only by: [name] MyChart Message (if you have MyChart) OR . A paper copy in the mail If you have any lab test that is abnormal or we need to change your treatment, we will call you to review the results.  Testing/Procedures:  NONE  Follow-Up: At [facility], you and your health needs are our priority.  As part of our continuing mission to provide you with exceptional heart care, we have created designated Provider Care Teams.  These Care Teams include your primary Cardiologist (physician) and Advanced Practice Providers (APPs -  Physician Assistants and Nurse Practitioners) who all work together to provide you with the care you need, when you need it. You will need a follow up appointment in 2-3 months.  Please call our office 2 months in advance to schedule this appointment.  You may see [name], MD or one of the following Advanced Practice Providers on your designated Care Team: [place], [place] . Fabian Sharp, PA-C  Any Other Special Instructions Will Be Listed Below (If Applicable).

## 2019-03-01 NOTE — Progress Notes (Signed)
Cardiology Office Note    Date:  03/01/2019   ID:  Haley [name], DOB 06-11-1966, MRN [id number]  PCP:  Haley [name]  Cardiologist:  Haley [name]   Chief Complaint  Patient presents with  . Hospitalization Follow-up    seen for Haley [name]    History of Present Illness:  Haley [name] is a 53 y.o. female  female with PMH of HTN, DM II, obesity, severe sleep apnea and PAF on Eliquis.  Prior to that, patient was admitted in January 2020 for palpitation after taking DayQuil.  She stopped beta-blocker for 2 months and was not using her CPAP therapy as well.  She did have a brief episode of hypoxia in the ED with chest tightness.  Troponin was negative.  Echocardiogram obtained on 01/02/2019 which showed EF 60 to 65%, normal valve.  CTA obtained on 01/27/2019 showed no evidence of PE, enlarged main pulmonary artery which may represent pulmonary artery hypertension, mosaic attenuation of the lung compatible with small vessel or small airway disease, thyroid goiter extending into the retrosternal space, aortic atherosclerosis.  She was placed on Toprol-XL 50 mg daily.  Patient presents today for cardiology office visit.  She continued to have intermittent chest discomfort that lasting less than 10 minutes every other day.  This is associated with tachycardia palpitation.  Her symptom has significantly improved after her Toprol-XL was increased to 50 mg daily during the hospitalization.  I recommended to further increase Toprol-XL to 50 mg a.m. and 25 mg p.m.  Otherwise she appears to be euvolemic on physical exam without lower extremity edema, orthopnea or PND.  Her chest pain is fairly atypical and does not have strong correlation with exertion and typically occurs more so with increased heart rate.  It is not affected by deep inspiration, body rotation or palpation.   Past Medical History:  Diagnosis Date  . Essential hypertension   . Fibroids   . Hyperthyroidism    a. 2010,  treated w/ tapazole.  . Morbid obesity ([place])   . Paroxysmal atrial fibrillation ([place])    a. 03/2009 - in setting of hyperthyroidism and caffeine intake, converted spontaneously;  b. 08/2016 ED visit for recurrent PAF-->successful DCCV in ED.  [name] Snores   . Type II diabetes mellitus ([place])     Past Surgical History:  Procedure Laterality Date  . UTERINE FIBROID SURGERY      Current Medications: Outpatient Medications Prior to Visit  Medication Sig Dispense Refill  . acetaminophen (TYLENOL) 500 MG tablet Take 500 mg by mouth daily.    [name] apixaban (ELIQUIS) 5 MG TABS tablet Take 1 tablet (5 mg total) by mouth 2 (two) times daily. 60 tablet 3  . atorvastatin (LIPITOR) 20 MG tablet Take 1 tablet (20 mg total) by mouth daily. 90 tablet 3  . BIOTIN PO Take 2 tablets by mouth daily.    [name] BLACK CURRANT SEED OIL PO Take 2 capsules by mouth daily.    . Blood Glucose Monitoring Suppl (TRUE METRIX METER) w/Device KIT Use twice daily 1 kit 0  . diclofenac sodium (VOLTAREN) 1 % GEL Apply 4 g topically 4 (four) times daily as needed. 100 g 3  . Ginkgo Biloba 40 MG TABS Take 80 mg by mouth daily.    [name] glipiZIDE (GLUCOTROL) 10 MG tablet Take 1 tablet (10 mg total) by mouth daily before breakfast. 30 tablet 3  . Glucosamine HCl (GLUCOSAMINE PO) Take 2 tablets by mouth daily.    [name]  glucose blood (TRUE METRIX BLOOD GLUCOSE TEST) test strip Use as instructed 100 each 12  . metFORMIN (GLUCOPHAGE) 1000 MG tablet Take 1 tablet (1,000 mg total) by mouth 2 (two) times daily with a meal. 60 tablet 3  . Methylsulfonylmethane (MSM PO) Take 2 tablets by mouth daily.    . Multiple Vitamins-Minerals (ALIVE WOMENS 50+) TABS Take 1 tablet by mouth daily.    . TRUEplus Lancets 28G MISC Use twice daily for blood glucose check 100 each 1  . metoprolol succinate (TOPROL-XL) 50 MG 24 hr tablet Take 1 tablet (50 mg total) by mouth daily. Take with or immediately following a meal. 30 tablet 3   No facility-administered medications  prior to visit.      Allergies:   Patient has no known allergies.   Social History   Socioeconomic History  . Marital status: Married    Spouse name: Not on file  . Number of children: Not on file  . Years of education: Not on file  . Highest education level: Not on file  Occupational History  . Occupation: Theatre manager  Social Needs  . Financial resource strain: Not on file  . Food insecurity:    Worry: Not on file    Inability: Not on file  . Transportation needs:    Medical: Not on file    Non-medical: Not on file  Tobacco Use  . Smoking status: Former Smoker    Packs/day: 1.00    Years: 32.00    Pack years: 32.00    Last attempt to quit: 2012    Years since quitting: 8.2  . Smokeless tobacco: Never Used  . Tobacco comment: patient vapes  Substance and Sexual Activity  . Alcohol use: No    Comment: occasional drink.  . Drug use: No  . Sexual activity: Yes  Lifestyle  . Physical activity:    Days [name] week: Not on file    Minutes [name] session: Not on file  . Stress: Not on file  Relationships  . Social connections:    Talks on phone: Not on file    Gets together: Not on file    Attends religious service: Not on file    Active member of club or organization: Not on file    Attends meetings of clubs or organizations: Not on file    Relationship status: Not on file  Other Topics Concern  . Not on file  Social History Narrative   Lives in [place] with husband.  Systems analyst.  Also in school @ [employer] for [employer].  Does not routinely exercise.     Family History:  The patient's family history includes Cancer in her maternal grandmother; Diabetes Mellitus II in her sister; Hypertension in her mother.   ROS:   Please see the history of present illness.    ROS All other systems reviewed and are negative.   PHYSICAL EXAM:   VS:  BP 120/62   Pulse 80   Ht _0  (1.702 m)   Wt 283 lb 12.8 oz (128.7 kg)   LMP 02/01/2019   BMI 44.45 kg/m    GEN:  Well nourished, well developed, in no acute distress  HEENT: normal  Neck: no JVD, carotid bruits, or masses Cardiac: RRR; no murmurs, rubs, or gallops,no edema  Respiratory:  clear to auscultation bilaterally, normal work of breathing GI: soft, nontender, nondistended, + BS MS: no deformity or atrophy  Skin: warm and dry, no rash Neuro:  Alert and Oriented x  3, Strength and sensation are intact Psych: euthymic mood, full affect  Wt Readings from Last 3 Encounters:  03/01/19 283 lb 12.8 oz (128.7 kg)  02/19/19 280 lb (127 kg)  02/17/19 282 lb (127.9 kg)      Studies/Labs Reviewed:   EKG:  EKG is ordered today.  The ekg ordered today demonstrates normal sinus rhythm without significant ST-T wave changes  Recent Labs: 01/24/2019: B Natriuretic Peptide 16.7; TSH 1.624 02/19/2019: ALT 13; BUN 10; Creatinine, Ser 0.54; Hemoglobin 14.1; Magnesium 1.6; Platelets 244; Potassium 4.4; Sodium 139   Lipid Panel    Component Value Date/Time   CHOL 187 02/19/2019 1132   TRIG 87 02/19/2019 1132   HDL 51 02/19/2019 1132   CHOLHDL 3.7 02/19/2019 1132   CHOLHDL 3.5 01/25/2019 0850   VLDL 19 01/25/2019 0850   LDLCALC 119 (H) 02/19/2019 1132    Additional studies/ records that were reviewed today include:   Echo 01/02/2019 1. The left ventricle appears to be normal in size, has normal wall thickness 60-65% ejection fraction Spectral Doppler shows normal pattern of diastolic filling.  2. The right ventricle has normal size and normal systolic function.  3. Normal left atrial size.  4. Normal right atrial size.  5. Mitral valve regurgitation is trivial by color flow Doppler.  6. No atrial level shunt detected by color flow Doppler.  7. Right ventricular systolic pressure is could not be assessed.    ASSESSMENT:    1. Chest discomfort   2. Essential hypertension   3. Controlled type 2 diabetes mellitus without complication, without long-term current use of insulin ([place])   4. OSA  (obstructive sleep apnea)   5. PAF (paroxysmal atrial fibrillation) ([facility])      PLAN:  In order of problems listed above:  1. Chest discomfort: Symptom atypical, does not occur with exertion.  Chest discomfort has significantly improved after increasing to Toprol-XL.  Will further increase Toprol-XL to 50 mg a.m. and 25 mg p.m.  2. Hypertension: Blood pressure well controlled  3. DM2: Managed by primary care provider  4. Severe sleep apnea: Pending repeat sleep study.  Likely contributing to her daytime fatigue.  5. PAF: On Eliquis.  Will further increased rate control by adding 25 mg Toprol-XL to nighttime dose    Medication Adjustments/Labs and Tests Ordered: Current medicines are reviewed at length with the patient today.  Concerns regarding medicines are outlined above.  Medication changes, Labs and Tests ordered today are listed in the Patient Instructions below. Patient Instructions  Medication Instructions:   TAKE METOPROLOL SUCCINATE XL 50 MG (1 TABLET) in the mornings and 25 MG (half tablet) in the evenings   If you need a refill on your cardiac medications before your next appointment, please call your pharmacy.   Lab work:  NONE  If you have labs (blood work) drawn today and your tests are completely normal, you will receive your results only by: [name] MyChart Message (if you have MyChart) OR . A paper copy in the mail If you have any lab test that is abnormal or we need to change your treatment, we will call you to review the results.  Testing/Procedures:  NONE  Follow-Up: At [facility], you and your health needs are our priority.  As part of our continuing mission to provide you with exceptional heart care, we have created designated Provider Care Teams.  These Care Teams include your primary Cardiologist (physician) and Advanced Practice Providers (APPs -  Physician Assistants and Nurse  Practitioners) who all work together to provide you with the care you need,  when you need it. You will need a follow up appointment in 2-3 months.  Please call our office 2 months in advance to schedule this appointment.  You may see [name], MD or one of the following Advanced Practice Providers on your designated Care Team: Cecilia, [place] . Fabian Sharp, PA-C  Any Other Special Instructions Will Be Listed Below (If Applicable).       Hilbert Corrigan, [place]  03/01/2019 3:12 PM    [facility] [address], [place], [place]  [address] Phone: [phone]; Fax: [phone]

## 2019-03-05 ENCOUNTER — Other Ambulatory Visit: Payer: Self-pay

## 2019-03-05 ENCOUNTER — Encounter: Payer: Self-pay | Admitting: Pharmacist

## 2019-03-05 ENCOUNTER — Ambulatory Visit: Payer: Self-pay | Attending: Family Medicine | Admitting: Pharmacist

## 2019-03-05 DIAGNOSIS — E1169 Type 2 diabetes mellitus with other specified complication: Secondary | ICD-10-CM

## 2019-03-05 MED ORDER — METFORMIN HCL ER 500 MG PO TB24: 2000.0000 mg | ORAL_TABLET | Freq: Every day | ORAL | 2 refills | Status: DC

## 2019-03-05 MED FILL — METOPROLOL SUCCINATE ER 50: 50 | 30 days supply | Qty: 45 | Fill #0

## 2019-03-05 MED FILL — METFORMIN HCL ER 500 MG TAB: 500 | 30 days supply | Qty: 120 | Fill #0

## 2019-03-05 NOTE — Progress Notes (Signed)
S:    PCP:  No PCP  No chief complaint on file.  Patient in good spirits. Servicefor DMmanagementwas provided via telemedicine. Patient is currentlyin her home; I am corresponding from my office in clinic. Afterprovidingname and DOB verification, pt is consensual to proceed with telephone visit.   She was seen and referred by Dr. Joya Gaskins on 02/19/19.   Patient reports diabetes was diagnosed ~5 years ago.   Family/Social History:  - FHx: HTN (mother), DM (father) - Tobacco: former smoker  - Alcohol: occasionally  Insurance coverage/medication affordability: self-pay  Patient denies adherence with medications.   Current diabetes medications include:  - Glipizide 10 mg daily before breakfast - Metformin 1000 mg BID (frequently forgets to take; endorses occasional diarrhea)  Patient denies hypoglycemic events.  Patient reported dietary habits:  - at least 2 meals/day - "I love sweets"  Patient-reported exercise habits:  - limited mobility d/t knee pain - has stationary bike; began using yesterday (03/04/19)   Patient reports occasional polyuria, polydipsia.  Patient reports numbness in R hand. Patient denies visual changes. Patient reports self foot exams.   O:  Home fasting CBG: gives range 240s, 250s  2 hour post-prandial/random CBG: 290s-300.  Lab Results  Component Value Date   HGBA1C 10.1 (H) 01/02/2019   There were no vitals filed for this visit.  Lipid Panel     Component Value Date/Time   CHOL 187 02/19/2019 1132   TRIG 87 02/19/2019 1132   HDL 51 02/19/2019 1132   CHOLHDL 3.7 02/19/2019 1132   CHOLHDL 3.5 01/25/2019 0850   VLDL 19 01/25/2019 0850   LDLCALC 119 (H) 02/19/2019 1132   Clinical ASCVD: No  The 10-year ASCVD risk score Mikey Bussing DC Jr., et al., 2013) is: 7.5%   Values used to calculate the score:     Age: 53 years     Sex: Female     Is Non-Hispanic African American: Yes     Diabetic: Yes     Tobacco smoker: No     Systolic  Blood Pressure: 120 mmHg     Is BP treated: Yes     HDL Cholesterol: 51 mg/dL     Total Cholesterol: 187 mg/dL   A/P: Diabetes longstanding currently uncontrolled. Patient is able to verbalize appropriate hypoglycemia management plan. Patient is not adherent with medication. Control is suboptimal due to medication noncompliance and dietary indiscretion.   Will try XR metformin in hopes of alleviating side effects. Also she can take 2g daily vs having to take BID. With her A1c and home sugars, she could be considered for introduction of insulin. She does have occasional symptoms of hyperglycemia. However, I do think this is due in part to diet and poor compliance. Will reassess in two weeks.  -Discontinued regular release metformin.  -Started metformin 500 mg XR; take four tablets (2000 mg total) daily after supper -Continued glipizide for 10 mg daily for now. -Extensively discussed pathophysiology of DM, recommended lifestyle interventions, dietary effects on glycemic control -Counseled on s/sx of and management of hypoglycemia -Next A1C anticipated 03/2019.   ASCVD risk - primary prevention in patient with DM. Last LDL is not controlled. ASCVD risk score is not >20%  - Moderate intensity statin indicated. Encouraged compliance with with atorvastatin.  -Continued atorvastatin 20 mg.   HM: pt is UTD on recommended vaccines.   Written patient instructions provided.  Total time counseling 10 minutes.   Follow up pharmacist telemedicine visit in 2 weeks.  Benard Halsted, PharmD, [place] [phone]

## 2019-03-05 NOTE — Addendum Note (Signed)
Addended by: Venetia Maxon on: 03/05/2019 03:55 PM   Modules accepted: Orders

## 2019-03-12 MED FILL — DICLOFENAC SODIUM 1% GEL: 1 | 6 days supply | Qty: 100 | Fill #1

## 2019-03-19 ENCOUNTER — Other Ambulatory Visit: Payer: Self-pay

## 2019-03-19 ENCOUNTER — Ambulatory Visit: Payer: Self-pay | Attending: Family Medicine | Admitting: Pharmacist

## 2019-03-19 DIAGNOSIS — E1169 Type 2 diabetes mellitus with other specified complication: Secondary | ICD-10-CM

## 2019-03-19 MED ORDER — GLIPIZIDE 10 MG PO TABS: 10.0000 mg | ORAL_TABLET | Freq: Two times a day (BID) | ORAL | 2 refills | Status: DC

## 2019-03-19 MED FILL — ATORVASTATIN 20 MG TABLET: 20 | 30 days supply | Qty: 30 | Fill #1

## 2019-03-19 MED FILL — glipiZIDE 10 MG TABS: 10 | 30 days supply | Qty: 60 | Fill #0

## 2019-03-19 MED FILL — METOPROLOL SUCCINATE ER 50: 50 | 30 days supply | Qty: 30 | Fill #1

## 2019-03-19 MED FILL — !ELIQUIS 5MG TABLET: 5 | 30 days supply | Qty: 60 | Fill #1

## 2019-03-19 NOTE — Progress Notes (Signed)
S:    PCP:  No PCP  No chief complaint on file.  Patient in good spirits. Servicefor DMmanagementwas provided via telemedicine. Patient is currentlyin her home; I am corresponding from my office in clinic. Afterprovidingname and DOB verification, pt is consensual to proceed with telephone visit.   She was seen and referred by Dr. Joya Gaskins on 02/19/19. I last saw her on 03/05/19 - switched her metformin to XR.   Patient reports diabetes was diagnosed ~5 years ago.   Family/Social History:  - FHx: HTN (mother), DM (father) - Tobacco: former smoker  - Alcohol: occasionally  Insurance coverage/medication affordability: self-pay  Patient reports adherence with medications.   Current diabetes medications include:  - Glipizide 10 mg daily before breakfast - Metformin 500mg  XR; 4 tablets after dinner   Patient denies hypoglycemic events.  Patient reported dietary habits:  - at least 2 meals/day - reports limiting carbohydrates and bread  - limits sweets to the weekends   Patient-reported exercise habits:  - uses stationary bike only two days out of the week   Patient denies polyuria, polydipsia.  Patient reports numbness in R hand. Patient denies visual changes. Patient reports self foot exams.   O:  Home fasting CBG: 180s-190s (down from 240s-250s) Evening post-prandial: 240s-250s (down from 290s-300s)  Lab Results  Component Value Date   HGBA1C 10.1 (H) 01/02/2019   There were no vitals filed for this visit.  Lipid Panel     Component Value Date/Time   CHOL 187 02/19/2019 1132   TRIG 87 02/19/2019 1132   HDL 51 02/19/2019 1132   CHOLHDL 3.7 02/19/2019 1132   CHOLHDL 3.5 01/25/2019 0850   VLDL 19 01/25/2019 0850   LDLCALC 119 (H) 02/19/2019 1132   Clinical ASCVD: No  The 10-year ASCVD risk score Mikey Bussing DC Jr., et al., 2013) is: 7.5%   Values used to calculate the score:     Age: 53 years     Sex: Female     Is Non-Hispanic African American: Yes  Diabetic: Yes     Tobacco smoker: No     Systolic Blood Pressure: 741 mmHg     Is BP treated: Yes     HDL Cholesterol: 51 mg/dL     Total Cholesterol: 187 mg/dL   A/P: Diabetes longstanding currently uncontrolled. Patient is able to verbalize appropriate hypoglycemia management plan. Patient is adherent with medication.   Pt with improved adherence and resolution of side effects since switching metformin to XR. Her blood sugars have improved. Polydipsia and polyuria now resolved. Would like to add GLP-1 RA or SGLT-2 inhibitor but she is uninsured. Patient expresses interest in once weekly injection that will also help with weight loss - we will have to wait for now. With covid concerns, she is not able to come to clinic to complete financial counseling. I will increase glipizide to BID for now.   -Continued 500 mg XR; take four tablets (2000 mg total) daily after supper -Increase glipizide to 10 mg BID -Extensively discussed pathophysiology of DM, recommended lifestyle interventions, dietary effects on glycemic control -Counseled on s/sx of and management of hypoglycemia -Next A1C anticipated 03/2019.   ASCVD risk - primary prevention in patient with DM. Last LDL is not controlled. ASCVD risk score is not >20%  - Moderate intensity statin indicated. Encouraged compliance with with atorvastatin.  -Continued atorvastatin 20 mg.   HM: pt is UTD on recommended vaccines.   Written patient instructions provided.  Total time counseling 10  minutes.   Follow up pharmacist telemedicine visit in 1 month.    Benard Halsted, PharmD, [place] [phone]

## 2019-04-01 ENCOUNTER — Ambulatory Visit: Payer: Self-pay | Admitting: *Deleted

## 2019-04-01 NOTE — Telephone Encounter (Signed)
Pt called in c/o her fingernails being white and having shortness of breath with exertion.  She does not have a PCP.   She goes to [facility] and they are closed due to the COVID-19 pandemic.  I referred her to an urgent care center.   She has decided to go to the [facility] [place] [facility].    Reason for Disposition . [1] MILD difficulty breathing (e.g., minimal/no SOB at rest, SOB with walking, pulse <100) AND [2] NEW-onset or WORSE than normal  Answer Assessment - Initial Assessment Questions 1. RESPIRATORY STATUS: "Describe your breathing?" (e.g., wheezing, shortness of breath, unable to speak, severe coughing)      y fingernails are white.   For a couple of days I've had shortness of breath and I'm sleepy.   Last time my O2 was low I had these same symptoms.    I'm very overweight.   My knees hurt.   I'm sweating. 2. ONSET: "When did this breathing problem begin?"      Maybe a week or 2 now. 3. PATTERN "Does the difficult breathing come and go, or has it been constant since it started?"      Comes and goes.    4. SEVERITY: "How bad is your breathing?" (e.g., mild, moderate, severe)    - MILD: No SOB at rest, mild SOB with walking, speaks normally in sentences, can lay down, no retractions, pulse < 100.    - MODERATE: SOB at rest, SOB with minimal exertion and prefers to sit, cannot lie down flat, speaks in phrases, mild retractions, audible wheezing, pulse 100-120.    - SEVERE: Very SOB at rest, speaks in single words, struggling to breathe, sitting hunched forward, retractions, pulse > 120      I'm short of breath when I up and doing something. 5. RECURRENT SYMPTOM: "Have you had difficulty breathing before?" If so, ask: "When was the last time?" and "What happened that time?"      Last 2 times I was admitted to the hospital they said my O2 was low. 6. CARDIAC HISTORY: "Do you have any history of heart disease?" (e.g., heart attack, angina,  bypass surgery, angioplasty)      A.Fib.   hypertension 7. LUNG HISTORY: "Do you have any history of lung disease?"  (e.g., pulmonary embolus, asthma, emphysema)     No.     8. CAUSE: "What do you think is causing the breathing problem?"      I have problems with pollen as I get older.    9. OTHER SYMPTOMS: "Do you have any other symptoms? (e.g., dizziness, runny nose, cough, chest pain, fever)     Little sore throat only.   I've been a little congested and I blow out a clear mucus on occasion. 10. PREGNANCY: "Is there any chance you are pregnant?" "When was your last menstrual period?"       No   11. TRAVEL: "Have you traveled out of the country in the last month?" (e.g., travel history, exposures)       No.    No exposures.  Protocols used: BREATHING DIFFICULTY-A-AH

## 2019-04-04 MED FILL — METFORMIN HCL ER 500 MG TAB: 500 | 30 days supply | Qty: 120 | Fill #1

## 2019-04-05 MED FILL — TRUE METRIX TEST STRIP: 50 days supply | Qty: 100 | Fill #1

## 2019-04-18 ENCOUNTER — Ambulatory Visit: Payer: Self-pay | Admitting: Pharmacist

## 2019-04-22 ENCOUNTER — Ambulatory Visit: Payer: Self-pay | Attending: Family Medicine | Admitting: Pharmacist

## 2019-04-22 ENCOUNTER — Other Ambulatory Visit: Payer: Self-pay

## 2019-04-22 DIAGNOSIS — E1169 Type 2 diabetes mellitus with other specified complication: Secondary | ICD-10-CM

## 2019-04-22 MED ORDER — DULAGLUTIDE 0.75 MG/0.5ML ~~LOC~~ SOAJ: SUBCUTANEOUS | 1 refills | Status: DC

## 2019-04-22 MED FILL — !TRULICITY 0.75 MG/0.5 ML P: 0.75 | 30 days supply | Qty: 2 | Fill #0

## 2019-04-22 NOTE — Progress Notes (Signed)
S:    PCP:  Dr. Joya Gaskins  No chief complaint on file.  Patient in good spirits. Servicefor DMmanagementwas provided via telemedicine. Patient is currentlyin her home; I am corresponding from my office in clinic. Afterprovidingname and DOB verification, pt is consensual to proceed with telephone visit.   She was seen and referred by Dr. Joya Gaskins on 02/19/19. I last saw her on 03/19/19 - I increased glipizide to 10 mg BID.   Patient reports diabetes was diagnosed ~5 years ago.   Family/Social History:  - FHx: HTN (mother), DM (father) - Tobacco: former smoker  - Alcohol: occasionally  Insurance coverage/medication affordability: self-pay  Patient reports adherence with medications.   Current diabetes medications include:  - Glipizide 10 mg BID - Metformin 500mg  XR; 4 tablets after dinner   Patient denies hypoglycemic events.  Patient reported dietary habits:  - at least 2 meals/day - reports limiting carbohydrates and bread  - limits sweets to the weekends   Patient-reported exercise habits:  - uses stationary bike only two days out of the week   Patient denies polyuria, polydipsia.  Patient reports numbness in R hand. Patient denies visual changes. Patient reports self foot exams.   O:  Home fasting CBG: 170-180 Evening post-prandial: 240s  Lab Results  Component Value Date   HGBA1C 10.1 (H) 01/02/2019   There were no vitals filed for this visit.  Lipid Panel     Component Value Date/Time   CHOL 187 02/19/2019 1132   TRIG 87 02/19/2019 1132   HDL 51 02/19/2019 1132   CHOLHDL 3.7 02/19/2019 1132   CHOLHDL 3.5 01/25/2019 0850   VLDL 19 01/25/2019 0850   LDLCALC 119 (H) 02/19/2019 1132   Clinical ASCVD: No  The 10-year ASCVD risk score Mikey Bussing DC Jr., et al., 2013) is: 7.5%   Values used to calculate the score:     Age: 53 years     Sex: Female     Is Non-Hispanic African American: Yes     Diabetic: Yes     Tobacco smoker: No     Systolic Blood  Pressure: 120 mmHg     Is BP treated: Yes     HDL Cholesterol: 51 mg/dL     Total Cholesterol: 187 mg/dL   A/P: Diabetes longstanding currently uncontrolled. Patient is able to verbalize appropriate hypoglycemia management plan. Patient is adherent with medication.   Pt with reported adherence and better tolerance to metformin. However, since titration of glipizide, her sugars have not responded much. I recommend Trulicity - pt is interested in an agent she can inject weekly that will also help with weight loss. Pt denies personal or family hx of thyroid medullary tumors. Denies hx of pancreatitis.   -Continued 500 mg XR; take four tablets (2000 mg total) daily after supper -Decrease glipizide back to 10 mg in the morning.  - Start Trulicity 9.89 mg every week.  -Extensively discussed pathophysiology of DM, recommended lifestyle interventions, dietary effects on glycemic control -Counseled on s/sx of and management of hypoglycemia -Overdue A1c; will obtain with in-patient visit next month.   ASCVD risk - primary prevention in patient with DM. Last LDL is not controlled. ASCVD risk score is not >20%  - Moderate intensity statin indicated. Encouraged compliance with with atorvastatin.  -Continued atorvastatin 20 mg.   HM: pt is UTD on recommended vaccines.   Written patient instructions provided.  Total time counseling 10 minutes.   Follow up pharmacist in-person visit in 1 month.  Benard Halsted, PharmD, [place] [phone]

## 2019-04-23 ENCOUNTER — Encounter: Payer: Self-pay | Admitting: Pharmacist

## 2019-05-06 MED FILL — glipiZIDE 10 MG TABS: 10 | 30 days supply | Qty: 60 | Fill #1

## 2019-05-06 MED FILL — ATORVASTATIN 20 MG TABLET: 20 | 30 days supply | Qty: 30 | Fill #2

## 2019-05-06 MED FILL — METOPROLOL SUCCINATE ER 50: 50 | 30 days supply | Qty: 30 | Fill #2

## 2019-05-08 ENCOUNTER — Ambulatory Visit
Admission: RE | Admit: 2019-05-08 | Discharge: 2019-05-08 | Disposition: A | Discharge status: Home / Self Care | Payer: Self-pay | Source: Ambulatory Visit | Attending: Critical Care Medicine | Admitting: Critical Care Medicine

## 2019-05-08 ENCOUNTER — Other Ambulatory Visit: Payer: Self-pay

## 2019-05-08 DIAGNOSIS — E049 Nontoxic goiter, unspecified: Secondary | ICD-10-CM

## 2019-05-13 ENCOUNTER — Telehealth: Payer: Self-pay | Admitting: Critical Care Medicine

## 2019-05-13 NOTE — Telephone Encounter (Signed)
Pt aware of results. Pt will need OV with PCP in next one month. Message sent to scheduler.

## 2019-05-14 ENCOUNTER — Other Ambulatory Visit: Payer: Self-pay | Admitting: Nurse Practitioner

## 2019-05-15 ENCOUNTER — Ambulatory Visit: Payer: Self-pay | Attending: Nurse Practitioner | Admitting: Nurse Practitioner

## 2019-05-15 ENCOUNTER — Encounter: Payer: Self-pay | Admitting: Nurse Practitioner

## 2019-05-15 ENCOUNTER — Other Ambulatory Visit: Payer: Self-pay

## 2019-05-15 VITALS — BP 105/65 | HR 87 | Temp 99.4°F | Ht 67.0 in | Wt 294.6 lb

## 2019-05-15 DIAGNOSIS — Z6841 Body Mass Index (BMI) 40.0 and over, adult: Secondary | ICD-10-CM

## 2019-05-15 DIAGNOSIS — E1169 Type 2 diabetes mellitus with other specified complication: Secondary | ICD-10-CM

## 2019-05-15 DIAGNOSIS — G8929 Other chronic pain: Secondary | ICD-10-CM

## 2019-05-15 DIAGNOSIS — M25562 Pain in left knee: Secondary | ICD-10-CM

## 2019-05-15 DIAGNOSIS — E785 Hyperlipidemia, unspecified: Secondary | ICD-10-CM

## 2019-05-15 LAB — GLUCOSE, POCT (MANUAL RESULT ENTRY): POC Glucose: 213 mg/dl — AB (ref 70–99)

## 2019-05-15 LAB — POCT GLYCOSYLATED HEMOGLOBIN (HGB A1C): Hemoglobin A1C: 9.2 % — AB (ref 4.0–5.6)

## 2019-05-15 MED ORDER — GLUCOSE BLOOD VI STRP: ORAL_STRIP | 12 refills | Status: DC

## 2019-05-15 MED ORDER — DICLOFENAC SODIUM 1 % TD GEL: 4.0000 g | Freq: Four times a day (QID) | TRANSDERMAL | 3 refills | Status: DC | PRN

## 2019-05-15 MED FILL — DICLOFENAC SODIUM 1% GEL: 1 | 6 days supply | Qty: 100 | Fill #0

## 2019-05-15 MED FILL — TRUE METRIX TEST STRIP: 25 days supply | Qty: 100 | Fill #0

## 2019-05-15 NOTE — Progress Notes (Signed)
Assessment & Plan:  Haley [name] was seen today for establish care.  Diagnoses and all orders for this visit:  Type 2 diabetes mellitus with other specified complication, without long-term current use of insulin (HCC) -     HgB A1c -     Glucose (CBG) -     glucose blood (TRUE METRIX BLOOD GLUCOSE TEST) test strip; Use as instructed -     Ambulatory referral to Ophthalmology Diabetes is poorly controlled. Advised patient to keep a fasting blood sugar log fast, 2 hours post lunch and bedtime which will be reviewed at the next office visit. Continue medications as prescribed.  Continue blood sugar control as discussed in office today, low carbohydrate diet, and regular physical exercise as tolerated, 150 minutes per week (30 min each day, 5 days per week, or 50 min 3 days per week). Keep blood sugar logs with fasting goal of 90-130 mg/dl, post prandial (after you eat) less than 180.  For Hypoglycemia: BS <60 and Hyperglycemia BS >400; contact the clinic ASAP. Annual eye exams and foot exams are recommended.  Dyslipidemia, goal LDL below 70 INSTRUCTIONS: Work on a low fat, heart healthy diet and participate in regular aerobic exercise program by working out at least 150 minutes per week; 5 days a week-30 minutes per day. Avoid red meat, fried foods. junk foods, sodas, sugary drinks, unhealthy snacking, alcohol and smoking.  Drink at least 48oz of water per day and monitor your carbohydrate intake daily.    Morbid obesity with BMI of 45.0-49.9, adult ([place]) Discussed diet and exercise for person with BMI >45. Instructed: You must burn more calories than you eat. Losing 5 percent of your body weight should be considered a success. In the longer term, losing more than 15 percent of your body weight and staying at this weight is an extremely good result. However, keep in mind that even losing 5 percent of your body weight leads to important health benefits, so try not to get discouraged if you're not able  to lose more than this. Will recheck weight in 3-6 months.   Chronic pain of left knee -     diclofenac sodium (VOLTAREN) 1 % GEL; Apply 4 g topically 4 (four) times daily as needed. Work on losing weight to help reduce joint pain. May alternate with heat and ice application for pain relief. May also alternate with acetaminophen  as prescribed pain relief. Other alternatives include massage, acupuncture and water aerobics.  You must stay active and avoid a sedentary lifestyle.    Patient has been counseled on age-appropriate routine health concerns for screening and prevention. These are reviewed and up-to-date. Referrals have been placed accordingly. Immunizations are up-to-date or declined.    Subjective:   Chief Complaint  Patient presents with   Establish Care    Pt. is here to establish care with PCP for diabetes.    HPI Haley [name] 53 y.o. female presents to office today to establish care. Patient has been advised to apply for financial assistance and schedule to see our financial counselor.   has a past medical history of Essential hypertension, Fibroids, Hyperthyroidism, Morbid obesity ([place]), Paroxysmal atrial fibrillation ([place]), Snores, and Type II diabetes mellitus ([place]).  Non compliance with medications and CPAP use. Taking metoprolol for PAH and tachycardic palpitations.    DM TYPE 2 Chronic and poorly controlled.  She was seen by the in-house pharmacist on 04/22/2019.  Current medications include metformin XR taking 2000 mg daily after supper, glipizide  10 mg, and Trulicity 8.14 mg every week which was added on 5-18. Twice a day home glucose checks with average fasting readings 120-140s. Postprandial 170-200. She denies any hypo or hyperglycemic symptoms. She states she has not been taking her metformin as she thought she was supposed to stop it after being put on trulicity. I explained to her that she should take all 3 medications to help lower her a1c. She will restart  her metformin as instructed.  Lab Results  Component Value Date   HGBA1C 9.2 (A) 05/15/2019   Lab Results  Component Value Date   HGBA1C 10.1 (H) 01/02/2019   Mixed Hyperlipidemia LDL not at goal <70. Currently taking atorvastatin 43m daily. Denies any statin intolerance or myalgias. We discussed dietary and exercise modifications today.  Lab Results  Component Value Date   LDLCALC 119 (H) 02/19/2019   Review of Systems  Constitutional: Negative for fever, malaise/fatigue and weight loss.  HENT: Negative.  Negative for nosebleeds.   Eyes: Negative.  Negative for blurred vision, double vision and photophobia.  Respiratory: Negative.  Negative for cough and shortness of breath.   Cardiovascular: Negative.  Negative for chest pain, palpitations and leg swelling.  Gastrointestinal: Negative.  Negative for heartburn, nausea and vomiting.  Musculoskeletal: Positive for back pain and joint pain. Negative for myalgias.  Neurological: Negative.  Negative for dizziness, focal weakness, seizures and headaches.  Psychiatric/Behavioral: Negative.  Negative for suicidal ideas.    Past Medical History:  Diagnosis Date   Essential hypertension    Fibroids    Hyperthyroidism    a. 2010, treated w/ tapazole.   [place]bid [place]    [place]    a. 03/2009 - in setting of hyperthyroidism and caffeine intake, converted spontaneously;  b. 08/2016 ED visit for recurrent PAF-->successful DCCV in ED.   Snores    Type II diabetes mellitus (HCC)     Past Surgical History:  Procedure Laterality Date   UTERINE FIBROID SURGERY      Family History  Problem Relation Age of Onset   Hypertension Mother    Diabetes Mellitus II Sister    Cancer Maternal Grandmother     Social History Reviewed with no changes to be made today.   Outpatient Medications Prior to Visit  Medication Sig Dispense Refill   apixaban (ELIQUIS) 5 MG TABS tablet Take 1 tablet (5 mg  total) by mouth 2 (two) times daily. 60 tablet 3   atorvastatin (LIPITOR) 20 MG tablet Take 1 tablet (20 mg total) by mouth daily. 90 tablet 3   BIOTIN PO Take 2 tablets by mouth daily.     BLACK CURRANT SEED OIL PO Take 2 capsules by mouth daily.     Blood Glucose Monitoring Suppl (TRUE METRIX METER) w/Device KIT Use twice daily 1 kit 0   Dulaglutide (TRULICITY) 04.81MEH/6.3JSSOPN Inject 0.75 mg into the skin once a week on Mondays. 2 mL 1   glipiZIDE (GLUCOTROL) 10 MG tablet Take 1 tablet (10 mg total) by mouth 2 (two) times daily before a meal. 60 tablet 2   Glucosamine HCl (GLUCOSAMINE PO) Take 2 tablets by mouth daily.     metoprolol succinate (TOPROL-XL) 50 MG 24 hr tablet Take 50 mg (1 tablet) in the mornings and 25 mg (half tablet) in the evenings 135 tablet 2   Multiple Vitamins-Minerals (ALIVE WOMENS 50+) TABS Take 1 tablet by mouth daily.     TRUEplus Lancets 28G MISC Use twice daily for blood  glucose check 100 each 1   diclofenac sodium (VOLTAREN) 1 % GEL Apply 4 g topically 4 (four) times daily as needed. 100 g 3   Ginkgo Biloba 40 MG TABS Take 80 mg by mouth daily.     glucose blood (TRUE METRIX BLOOD GLUCOSE TEST) test strip Use as instructed 100 each 12   acetaminophen (TYLENOL) 500 MG tablet Take 500 mg by mouth daily.     metFORMIN (GLUCOPHAGE-XR) 500 MG 24 hr tablet Take 4 tablets (2,000 mg total) by mouth daily. (Patient not taking: Reported on 05/15/2019) 120 tablet 2   Methylsulfonylmethane (MSM PO) Take 2 tablets by mouth daily.     No facility-administered medications prior to visit.     No Known Allergies     Objective:    BP 105/65 (BP Location: Right Arm, Patient Position: Sitting, Cuff Size: Large)    Pulse 87    Temp 99.4 F (37.4 C) (Oral)    Ht _0  (1.702 m)    Wt 294 lb 9.6 oz (133.6 kg)    BMI 46.14 kg/m  Wt Readings from Last 3 Encounters:  05/15/19 294 lb 9.6 oz (133.6 kg)  03/01/19 283 lb 12.8 oz (128.7 kg)  02/19/19 280 lb (127 kg)     Physical Exam Vitals signs and nursing note reviewed.  Constitutional:      Appearance: She is well-developed.  HENT:     Head: Normocephalic and atraumatic.  Neck:     Musculoskeletal: Normal range of motion.  Cardiovascular:     Rate and Rhythm: Normal rate and regular rhythm.     Heart sounds: Normal heart sounds. No murmur. No friction rub. No gallop.   Pulmonary:     Effort: Pulmonary effort is normal. No tachypnea or respiratory distress.     Breath sounds: Normal breath sounds. No decreased breath sounds, wheezing, rhonchi or rales.  Chest:     Chest wall: No tenderness.  Abdominal:     General: Bowel sounds are normal.     Palpations: Abdomen is soft.  Musculoskeletal: Normal range of motion.     Left knee: She exhibits swelling (minimal swelling.). She exhibits normal range of motion, no effusion, no ecchymosis and no deformity.  Skin:    General: Skin is warm and dry.  Neurological:     Mental Status: She is alert and oriented to person, place, and time.     Coordination: Coordination normal.  Psychiatric:        Behavior: Behavior normal. Behavior is cooperative.        Thought Content: Thought content normal.        Judgment: Judgment normal.        Patient has been counseled extensively about nutrition and exercise as well as the importance of adherence with medications and regular follow-up. The patient was given clear instructions to go to ER or return to medical center if symptoms don't improve, worsen or new problems develop. The patient verbalized understanding.   Follow-up: Return in about 3 months (around 08/15/2019) for DM/HPL.   [name], FNP-BC [facility] and [place], [place]   05/15/2019, 10:15 AM

## 2019-05-20 ENCOUNTER — Other Ambulatory Visit: Payer: Self-pay

## 2019-05-20 ENCOUNTER — Ambulatory Visit: Payer: Self-pay | Attending: Nurse Practitioner | Admitting: Pharmacist

## 2019-05-20 ENCOUNTER — Encounter: Payer: Self-pay | Admitting: Pharmacist

## 2019-05-20 DIAGNOSIS — E1169 Type 2 diabetes mellitus with other specified complication: Secondary | ICD-10-CM

## 2019-05-20 LAB — GLUCOSE, POCT (MANUAL RESULT ENTRY): POC Glucose: 204 mg/dl — AB (ref 70–99)

## 2019-05-20 MED ORDER — TRULICITY 1.5 MG/0.5ML ~~LOC~~ SOAJ: 1.5000 mg | SUBCUTANEOUS | 2 refills | Status: DC

## 2019-05-20 MED FILL — METFORMIN HCL ER 500 MG TAB: 500 | 30 days supply | Qty: 120 | Fill #2

## 2019-05-20 MED FILL — TRULICITY 1.5 MG/0.5 ML PEN: 1.5 | 28 days supply | Qty: 2 | Fill #0

## 2019-05-20 NOTE — Progress Notes (Signed)
    S:    PCP:  Dr. Joya Gaskins  No chief complaint on file.  Arrives in good spirits. Patient presents for DM follow-up. Was referred by Dr. Joya Gaskins 02/19/19. I last saw her via Telemedicine 06/04/76 and started Trulicity.   Patient reports diabetes was diagnosed ~5 years ago.   Family/Social History:  - FHx: HTN (mother), DM (father) - Tobacco: former smoker  - Alcohol: occasionally  Insurance coverage/medication affordability: self-pay  Patient denies adherence with medications.   Current diabetes medications include:  - Glipizide 10 mg in the AM - Metformin 500mg  XR; 4 tablets after dinner (stopped taking 04/22/19 d/t misunderstanding instructions) -Trulicity 9.39 mg weekly   Patient denies hypoglycemic events.  Patient reported dietary habits:  - At least 2 meals/day - Drinks glucerna - Admits to occasional bread/carbs (trying to limit) - Limits sweets to the weekends   Patient-reported exercise habits:  - uses stationary bike only two days out of the week   Patient denies polyuria, polydipsia.  Patient reports numbness in R hand. Patient denies visual changes. Patient reports self foot exams.   O:  POCT glucose: 204  Home fasting CBG: 160-170s Evening post-prandial: 240s  Lab Results  Component Value Date   HGBA1C 9.2 (A) 05/15/2019   There were no vitals filed for this visit.  Lipid Panel     Component Value Date/Time   CHOL 187 02/19/2019 1132   TRIG 87 02/19/2019 1132   HDL 51 02/19/2019 1132   CHOLHDL 3.7 02/19/2019 1132   CHOLHDL 3.5 01/25/2019 0850   VLDL 19 01/25/2019 0850   LDLCALC 119 (H) 02/19/2019 1132   Clinical ASCVD: No  The 10-year ASCVD risk score Mikey Bussing DC Jr., et al., 2013) is: 4.6%   Values used to calculate the score:     Age: 53 years     Sex: Female     Is Non-Hispanic African American: Yes     Diabetic: Yes     Tobacco smoker: No     Systolic Blood Pressure: 030 mmHg     Is BP treated: Yes     HDL Cholesterol: 51 mg/dL  Total Cholesterol: 187 mg/dL   A/P: Diabetes longstanding currently uncontrolled. Patient is able to verbalize appropriate hypoglycemia management plan. Patient is not adherent with medication.  -Restart metformin 500 mg XR; take four tablets (2000 mg total) daily after supper -Increase Trulicity to 1.5 mg every week.  -Continued glipizide  -Extensively discussed pathophysiology of DM, recommended lifestyle interventions, dietary effects on glycemic control -Counseled on s/sx of and management of hypoglycemia  ASCVD risk - primary prevention in patient with DM. Last LDL is not controlled. ASCVD risk score is not >20%  - Moderate intensity statin indicated. Encouraged compliance with with atorvastatin.  -Continued atorvastatin 20 mg.   HM: pt is UTD on recommended vaccines.   Written patient instructions provided.  Total time counseling 10 minutes.   Follow up pharmacist in-person visit in 1 month.    Patient seen with:  Jonathon Bellows PharmD Candidate [phone] [place], PharmD, [place] [phone]

## 2019-05-20 NOTE — Patient Instructions (Signed)
Thank you for coming to see me today. Please do the following:  1. Restart metformin. I've sent the refills to the pharmacy.  2. We are increasing your Trulicity to 1.5 mg weekly. Finish your 0.75 mg shot today. Then start the new strength next week.  3. Continue glipizide in the morning.  4. Continue checking blood sugars at home.  5. Continue making the lifestyle changes we've discussed together during our visit. Diet and exercise play a significant role in improving your blood sugars.  6. Follow-up with me in 1 month.    Hypoglycemia or low blood sugar:   Low blood sugar can happen quickly and may become an emergency if not treated right away.   While this shouldn't happen often, it can be brought upon if you skip a meal or do not eat enough. Also, if your insulin or other diabetes medications are dosed too high, this can cause your blood sugar to go to low.   Warning signs of low blood sugar include: 1. Feeling shaky or dizzy 2. Feeling weak or tired  3. Excessive hunger 4. Feeling anxious or upset  5. Sweating even when you aren't exercising  What to do if I experience low blood sugar? 1. Check your blood sugar with your meter. If lower than 70, proceed to step 2.  2. Treat with 3-4 glucose tablets or 3 packets of regular sugar. If these aren't around, you can try hard candy. Yet another option would be to drink 4 ounces of fruit juice or 6 ounces of REGULAR soda.  3. Re-check your sugar in 15 minutes. If it is still below 70, do what you did in step 2 again. If has come back up, go ahead and eat a snack or small meal at this time.

## 2019-06-04 MED FILL — ATORVASTATIN 20 MG TABLET: 20 | 30 days supply | Qty: 30 | Fill #3

## 2019-06-04 MED FILL — METOPROLOL SUCCINATE ER 50: 50 | 30 days supply | Qty: 30 | Fill #3

## 2019-06-25 ENCOUNTER — Other Ambulatory Visit: Payer: Self-pay

## 2019-06-25 ENCOUNTER — Ambulatory Visit: Payer: Self-pay | Attending: Nurse Practitioner | Admitting: Pharmacist

## 2019-06-25 DIAGNOSIS — E1169 Type 2 diabetes mellitus with other specified complication: Secondary | ICD-10-CM

## 2019-06-25 LAB — GLUCOSE, POCT (MANUAL RESULT ENTRY): POC Glucose: 125 mg/dl — AB (ref 70–99)

## 2019-06-25 MED FILL — TRULICITY 1.5 MG/0.5 ML PEN: 1.5 | 28 days supply | Qty: 2 | Fill #1

## 2019-06-25 NOTE — Progress Notes (Signed)
    S:    PCP:  Zelda  No chief complaint on file.  Arrives in good spirits. Patient presents for DM follow-up. Was referred by Dr. Joya Gaskins 02/19/19. I last saw her via 05/20/19 and increased Trulicity. We also restarted metformin.   Patient reports diabetes was diagnosed ~5 years ago.   Family/Social History:  - FHx: HTN (mother), DM (father) - Tobacco: former smoker  - Alcohol: occasionally  Insurance coverage/medication affordability: self-pay  Patient denies adherence with medications.   Current diabetes medications include:  - Glipizide 10 mg in the AM - Metformin 500mg  XR; 4 tablets after dinner  -Trulicity 1.5mg  weekly   Patient denies hypoglycemic events.  Patient reported dietary habits:  - At least 2 meals/day - Drinks glucerna - Admits to occasional bread/carbs (trying to limit) - Limits sweets to the weekends   Patient-reported exercise habits:  - uses stationary bike only two days out of the week   Patient denies polyuria, polydipsia.  Patient reports numbness in R hand. Patient denies visual changes. Patient reports self foot exams.   O:  POCT glucose: 125  Home fasting CBG: 110s Evening post-prandial: 120s - 140s  Lab Results  Component Value Date   HGBA1C 9.2 (A) 05/15/2019   There were no vitals filed for this visit.  Lipid Panel     Component Value Date/Time   CHOL 187 02/19/2019 1132   TRIG 87 02/19/2019 1132   HDL 51 02/19/2019 1132   CHOLHDL 3.7 02/19/2019 1132   CHOLHDL 3.5 01/25/2019 0850   VLDL 19 01/25/2019 0850   LDLCALC 119 (H) 02/19/2019 1132   Clinical ASCVD: No  The 10-year ASCVD risk score Mikey Bussing DC Jr., et al., 2013) is: 4.6%   Values used to calculate the score:     Age: 53 years     Sex: Female     Is Non-Hispanic African American: Yes     Diabetic: Yes     Tobacco smoker: No     Systolic Blood Pressure: 553 mmHg     Is BP treated: Yes     HDL Cholesterol: 51 mg/dL     Total Cholesterol: 187 mg/dL   A/P:  Diabetes longstanding currently uncontrolled, but home sugars have improved. Patient is able to verbalize appropriate hypoglycemia management plan. Patient is adherent with medication.  -Continue current regimen. -Extensively discussed pathophysiology of DM, recommended lifestyle interventions, dietary effects on glycemic control -Counseled on s/sx of and management of hypoglycemia  ASCVD risk - primary prevention in patient with DM. Last LDL is not controlled. ASCVD risk score is not >20%  - Moderate intensity statin indicated. Encouraged compliance with with atorvastatin.  -Continued atorvastatin 20 mg.   HM: pt is UTD on recommended vaccines.   Written patient instructions provided. Total time counseling 10 minutes.   Follow up w/ PCP in September.     Benard Halsted, PharmD, [place] [phone]

## 2019-06-25 NOTE — Patient Instructions (Signed)
Thank you for coming to see me today. Please do the following:  1. Continue current regimen.  2. Continue checking blood sugars at home.   3. Continue making the lifestyle changes we've discussed together during our visit. Diet and exercise play a significant role in improving your blood sugars.  4. Follow-up with Zelda in September.   Hypoglycemia or low blood sugar:   Low blood sugar can happen quickly and may become an emergency if not treated right away.   While this shouldn't happen often, it can be brought upon if you skip a meal or do not eat enough. Also, if your insulin or other diabetes medications are dosed too high, this can cause your blood sugar to go to low.   Warning signs of low blood sugar include: 1. Feeling shaky or dizzy 2. Feeling weak or tired  3. Excessive hunger 4. Feeling anxious or upset  5. Sweating even when you aren't exercising  What to do if I experience low blood sugar? 1. Check your blood sugar with your meter. If lower than 70, proceed to step 2.  2. Treat with 3-4 glucose tablets or 3 packets of regular sugar. If these aren't around, you can try hard candy. Yet another option would be to drink 4 ounces of fruit juice or 6 ounces of REGULAR soda.  3. Re-check your sugar in 15 minutes. If it is still below 70, do what you did in step 2 again. If has come back up, go ahead and eat a snack or small meal at this time.

## 2019-06-26 ENCOUNTER — Encounter: Payer: Self-pay | Admitting: Pharmacist

## 2019-06-26 MED ORDER — METFORMIN HCL ER 500 MG PO TB24: 2000.0000 mg | ORAL_TABLET | Freq: Every day | ORAL | 2 refills | Status: DC

## 2019-06-26 MED FILL — !ELIQUIS 5MG TABLET: 5 | 30 days supply | Qty: 60 | Fill #2

## 2019-06-26 MED FILL — METFORMIN HCL ER 500 MG TB2: 500 | 30 days supply | Qty: 120 | Fill #0

## 2019-07-03 MED FILL — glipiZIDE 10 MG TABS: 10 | 30 days supply | Qty: 60 | Fill #2

## 2019-07-10 ENCOUNTER — Other Ambulatory Visit: Payer: Self-pay | Admitting: Critical Care Medicine

## 2019-07-11 MED ORDER — METOPROLOL SUCCINATE ER 50 MG PO TB24: ORAL_TABLET | ORAL | 2 refills | Status: DC

## 2019-07-11 MED FILL — METOPROLOL SUCCINATE ER 50: 50 | 30 days supply | Qty: 45 | Fill #0

## 2019-07-12 MED FILL — glipiZIDE 10 MG TABS: 10 | 30 days supply | Qty: 60 | Fill #2

## 2019-07-29 MED FILL — $ELIQUIS 5 MG TABLET: 5 | 30 days supply | Qty: 60 | Fill #3

## 2019-08-05 ENCOUNTER — Encounter: Payer: Self-pay | Admitting: Cardiovascular Disease

## 2019-08-05 ENCOUNTER — Ambulatory Visit (INDEPENDENT_AMBULATORY_CARE_PROVIDER_SITE_OTHER): Payer: Self-pay | Admitting: Cardiovascular Disease

## 2019-08-05 ENCOUNTER — Other Ambulatory Visit: Payer: Self-pay

## 2019-08-05 VITALS — BP 122/68 | HR 84 | Temp 97.0°F | Ht 67.0 in | Wt 293.0 lb

## 2019-08-05 DIAGNOSIS — I480 Paroxysmal atrial fibrillation: Secondary | ICD-10-CM

## 2019-08-05 DIAGNOSIS — Z7901 Long term (current) use of anticoagulants: Secondary | ICD-10-CM

## 2019-08-05 DIAGNOSIS — E119 Type 2 diabetes mellitus without complications: Secondary | ICD-10-CM

## 2019-08-05 DIAGNOSIS — E785 Hyperlipidemia, unspecified: Secondary | ICD-10-CM

## 2019-08-05 DIAGNOSIS — I10 Essential (primary) hypertension: Secondary | ICD-10-CM

## 2019-08-05 DIAGNOSIS — G4733 Obstructive sleep apnea (adult) (pediatric): Secondary | ICD-10-CM

## 2019-08-05 NOTE — Patient Instructions (Signed)
Medication Instructions:  The current medical regimen is effective;  continue present plan and medications.  If you need a refill on your cardiac medications before your next appointment, please call your pharmacy.   Follow-Up: At [facility], you and your health needs are our priority.  As part of our continuing mission to provide you with exceptional heart care, we have created designated Provider Care Teams.  These Care Teams include your primary Cardiologist (physician) and Advanced Practice Providers (APPs -  Physician Assistants and Nurse Practitioners) who all work together to provide you with the care you need, when you need it. . Follow up based on when you are able to get your machine.  Any Other Special Instructions Will Be Listed Below (If Applicable). I will reach out to [place], our sleep coordinator to see if she can be of any assistance.

## 2019-08-05 NOTE — Progress Notes (Signed)
Cardiology Office Note    Date:  08/07/2019   ID:  Haley [name], DOB 01-06-1966, MRN [id number]  PCP:  [name], NP  Cardiologist:  [name], MD (sleep)    History of Present Illness:  Haley [name] is a 53 y.o. female who presents for a follow-up sleep  evaluation.  Haley [name] has a history of PAF, which initially occurred in the setting of hyperthyroidism in 2010.  She recently developed recurrent paroxysmal atrial fibrillation and underwent cardioversion in the ED in September 2017.  She has a history of type 2 diabetes mellitus, morbid obesity, hypertension, and she had recently seen [name] in November 2017.  At that time, she was started on beta blocker therapy.  She was started on eliquis anticoagulation.  Due to concerns for obstructive sleep apnea.  She was referred for a sleep study which was done on 12/13/2016.  She had severe sleep apnea with an AHI of 111.2 per hour, an RDI of 113.  AHI during REM sleep was 74.2.  She had severe oxygen desaturation to 51%.  She underwent a CPAP, and ultimate BiPAP titration on 01/04/2017.  On her titration study CPAP was advanced to 19 cm water pressure but due to continued events, BiPAP was implemented and she required maximum titration up to 25/21.  At 23/19, AHI was still elevated 6.4, but 25/21 at AHI was 0.  Apparently, the patient had concerns about cost since she currently does not have insurance.  She was wondering about using a CPAP from a family member since he no longer uses treatment.  I was concerned that CPAP may not be as beneficial.  When I saw her in July 2018, she  brought the machine with her to the office today for our review and assessment.  She was given a ResMed S9 and had a  full face mas given to her the time of first titration study.  Presently, she goes to bed around 1 AM and wakes up for good around noon.  She had frequent awakenings during the night and  daytime sleepiness.  Epworth  Sleepiness Scale: Situation   Chance of Dozing/Sleeping (0 = never , 1 = slight chance , 2 = moderate chance , 3 = high chance )   sitting and reading 2   watching TV 2   sitting inactive in a public place 2   being a passenger in a motor vehicle for an hour or more 1   lying down in the afternoon 2   sitting and talking to someone 1   sitting quietly after lunch (no alcohol) 2   while stopped for a few minutes in traffic as the driver 2   Total Score  14   When I saw her, I recommended that her CPAP unit  adjusted to it 20 cm maximum pressure.  We had set up the process through assisted support for her to try to get a BiPAP machine.  This is still in process.  She did reach out to advance home care since this was accompanied that had supplied her friend with the CPAP unit that she is receiving.  She has felt improved since using CPAP therapy.  However, she still admits to fatigue.  She feels  that in the middle of night she was not getting enough air and for this reason , was taking her mask off when she felt the sensation.  When I saw her in March 2020 he had  not received a BiPAP machine she did not bring the CPAP of her friend who she was using for interrogation.  Presently, Haley [name] continues to have issues admits to no energy, poor sleep, snoring, and nocturia at least 3 times per night.  She is now working doing senior sitting.  She still does not have insurance.  She is unaware of any recurrent or fibrillation and continues to be on Eliquis for anticoagulation in addition to metoprolol succinate 50 mg in the morning and 25 mg in the evening.  She is diabetic on Trulicity in addition to metformin and glipizide.  She is on atorvastatin 20 mg daily for hyperlipidemia.  Past Medical History:  Diagnosis Date  . Essential hypertension   . Fibroids   . Hyperthyroidism    a. 2010, treated w/ tapazole.  . Morbid obesity ([place])   . Paroxysmal atrial fibrillation ([place])    a. 03/2009 - in  setting of hyperthyroidism and caffeine intake, converted spontaneously;  b. 08/2016 ED visit for recurrent PAF-->successful DCCV in ED.  [name] Snores   . Type II diabetes mellitus ([place])     Past Surgical History:  Procedure Laterality Date  . UTERINE FIBROID SURGERY      Current Medications: Outpatient Medications Prior to Visit  Medication Sig Dispense Refill  . acetaminophen (TYLENOL 8 HOUR ARTHRITIS PAIN) 650 MG CR tablet Take 650 mg by mouth every 8 (eight) hours as needed for pain.    [name] apixaban (ELIQUIS) 5 MG TABS tablet Take 1 tablet (5 mg total) by mouth 2 (two) times daily. 60 tablet 3  . Aspirin-Caffeine (BC FAST PAIN RELIEF ARTHRITIS PO) Take by mouth as needed.    [name] atorvastatin (LIPITOR) 20 MG tablet Take 1 tablet (20 mg total) by mouth daily. 90 tablet 3  . BIOTIN PO Take 2 tablets by mouth daily.    [name] BLACK CURRANT SEED OIL PO Take 2 capsules by mouth daily.    . Blood Glucose Monitoring Suppl (TRUE METRIX METER) w/Device KIT Use twice daily 1 kit 0  . diclofenac sodium (VOLTAREN) 1 % GEL Apply 4 g topically 4 (four) times daily as needed. 100 g 3  . Dulaglutide (TRULICITY) 1.5 FY/9.2KM SOPN Inject 1.5 mg into the skin once a week. 2 mL 2  . glipiZIDE (GLUCOTROL) 10 MG tablet Take 1 tablet (10 mg total) by mouth 2 (two) times daily before a meal. 60 tablet 2  . Glucosamine HCl (GLUCOSAMINE PO) Take 2 tablets by mouth daily.    [name] glucose blood (TRUE METRIX BLOOD GLUCOSE TEST) test strip Use as instructed 100 each 12  . metFORMIN (GLUCOPHAGE-XR) 500 MG 24 hr tablet Take 4 tablets (2,000 mg total) by mouth daily. 120 tablet 2  . Methylsulfonylmethane (MSM PO) Take 2 tablets by mouth daily.    . metoprolol succinate (TOPROL-XL) 50 MG 24 hr tablet Take 50 mg (1 tablet) in the mornings and 25 mg (half tablet) in the evenings 45 tablet 2  . Multiple Vitamins-Minerals (ALIVE WOMENS 50+) TABS Take 1 tablet by mouth daily.    . TRUEplus Lancets 28G MISC Use twice daily for blood glucose  check 100 each 1  . acetaminophen (TYLENOL) 500 MG tablet Take 500 mg by mouth daily.     No facility-administered medications prior to visit.      Allergies:   Patient has no known allergies.   Social History   Socioeconomic History  . Marital status: Married    Spouse name: Not  on file  . Number of children: Not on file  . Years of education: Not on file  . Highest education level: Not on file  Occupational History  . Occupation: Theatre manager  Social Needs  . Financial resource strain: Not on file  . Food insecurity    Worry: Not on file    Inability: Not on file  . Transportation needs    Medical: Not on file    Non-medical: Not on file  Tobacco Use  . Smoking status: Former Smoker    Packs/day: 1.00    Years: 32.00    Pack years: 32.00    Quit date: 2012    Years since quitting: 8.6  . Smokeless tobacco: Never Used  . Tobacco comment: patient vapes  Substance and Sexual Activity  . Alcohol use: No    Comment: occasional drink.  . Drug use: No  . Sexual activity: Yes  Lifestyle  . Physical activity    Days per week: Not on file    Minutes per session: Not on file  . Stress: Not on file  Relationships  . Social Herbalist on phone: Not on file    Gets together: Not on file    Attends religious service: Not on file    Active member of club or organization: Not on file    Attends meetings of clubs or organizations: Not on file    Relationship status: Not on file  Other Topics Concern  . Not on file  Social History Narrative   Lives in [place] with husband.  Systems analyst.  Also in school @ [employer] for [employer].  Does not routinely exercise.     Family History:  The patient's family history includes Cancer in her maternal grandmother; Diabetes Mellitus II in her sister; Hypertension in her mother.  Family history is notable in that her uncle  had sleep apnea but has not utilized treatment and he gave her his machine.  ROS General:  Negative; No fevers, chills, or night sweats;  HEENT: Negative; No changes in vision or hearing, sinus congestion, difficulty swallowing Pulmonary: Negative; No cough, wheezing, shortness of breath, hemoptysis Cardiovascular: History of PAF GI: Negative; No nausea, vomiting, diarrhea, or abdominal pain GU: Negative; No dysuria, hematuria, or difficulty voiding Musculoskeletal: Negative; no myalgias, joint pain, or weakness Hematologic/Oncology: Negative; no easy bruising, bleeding Endocrine: Positive for diabetes mellitus Neuro: Negative; no changes in balance, headaches Skin: Negative; No rashes or skin lesions Psychiatric: Negative; No behavioral problems, depression Sleep: Positive for severe sleep apnea; snoring, daytime sleepiness, hypersomnolence;  No bruxism, restless legs, hypnogognic hallucinations, no cataplexy Other comprehensive 14 point system review is negative.   PHYSICAL EXAM:   VS:  BP 122/68   Pulse 84   Temp (!) 97 F (36.1 C)   Ht '5\' 7"'$  (1.702 m)   Wt 293 lb (132.9 kg)   BMI 45.89 kg/m     Repeat blood pressure by me 120/70  Wt Readings from Last 3 Encounters:  08/05/19 293 lb (132.9 kg)  05/15/19 294 lb 9.6 oz (133.6 kg)  03/01/19 283 lb 12.8 oz (128.7 kg)    General: Alert, oriented, no distress.  Morbid obesity Skin: normal turgor, no rashes, warm and dry HEENT: Normocephalic, atraumatic. Pupils equal round and reactive to light; sclera anicteric; extraocular muscles intact;  Nose without nasal septal hypertrophy Mouth/Parynx benign; Mallinpatti scale 4 Neck: No JVD, no carotid bruits; normal carotid upstroke Lungs: clear to ausculatation and percussion; no wheezing  or rales Chest wall: without tenderness to palpitation Heart: PMI not displaced, RRR, s1 s2 normal, 1/6 systolic murmur, no diastolic murmur, no rubs, gallops, thrills, or heaves Abdomen: Central adiposity; soft, nontender; no hepatosplenomehaly, BS+; abdominal aorta nontender and not  dilated by palpation. Back: no CVA tenderness Pulses 2+ Musculoskeletal: full range of motion, normal strength, no joint deformities Extremities: no clubbing cyanosis or edema, Homan's sign negative  Neurologic: grossly nonfocal; Cranial nerves grossly wnl Psychologic: Normal mood and affect   Studies/Labs Reviewed:   EKG:  EKG is ordered today.  ECG (independently read by me): Normal sinus rhythm at 84 bpm.  Normal intervals.  No ectopy  October 2018 ECG (independently read by me): Normal sinus rhythm at 73 bpm.  No ectopy.  Normal intervals.  Recent Labs: BMP Latest Ref Rng & Units 02/19/2019 01/25/2019 01/24/2019  Glucose 65 - 99 mg/dL 282(H) 311(H) 293(H)  BUN 6 - 24 mg/dL '10 8 9  '$ Creatinine 0.57 - 1.00 mg/dL 0.54(L) 0.53 0.50  BUN/Creat Ratio 9 - 23 19 - -  Sodium 134 - 144 mmol/L 139 135 137  Potassium 3.5 - 5.2 mmol/L 4.4 3.8 4.0  Chloride 96 - 106 mmol/L 99 97(L) 99  CO2 20 - 29 mmol/L '24 27 28  '$ Calcium 8.7 - 10.2 mg/dL 10.0 9.2 9.6     Hepatic Function Latest Ref Rng & Units 02/19/2019 03/21/2018 05/04/2009  Total Protein 6.0 - 8.5 g/dL 7.3 8.1 7.5  Albumin 3.8 - 4.9 g/dL 4.5 4.3 4.1  AST 0 - 40 IU/L 14 23 38(H)  ALT 0 - 32 IU/L '13 20 19  '$ Alk Phosphatase 39 - 117 IU/L 58 53 45  Total Bilirubin 0.0 - 1.2 mg/dL 0.3 0.6 0.7    CBC Latest Ref Rng & Units 02/19/2019 01/25/2019 01/24/2019  WBC 3.4 - 10.8 x10E3/uL 7.7 8.0 8.1  Hemoglobin 11.1 - 15.9 g/dL 14.1 13.3 14.1  Hematocrit 34.0 - 46.6 % 42.6 43.5 45.0  Platelets 150 - 450 x10E3/uL 244 244 262   Lab Results  Component Value Date   MCV 92 02/19/2019   MCV 94.4 01/25/2019   MCV 95.5 01/24/2019   Lab Results  Component Value Date   TSH 1.624 01/24/2019   Lab Results  Component Value Date   HGBA1C 9.2 (A) 05/15/2019     BNP    Component Value Date/Time   BNP 16.7 01/24/2019 1834    ProBNP No results found for: PROBNP   Lipid Panel     Component Value Date/Time   CHOL 187 02/19/2019 1132   TRIG 87  02/19/2019 1132   HDL 51 02/19/2019 1132   CHOLHDL 3.7 02/19/2019 1132   CHOLHDL 3.5 01/25/2019 0850   VLDL 19 01/25/2019 0850   LDLCALC 119 (H) 02/19/2019 1132     RADIOLOGY: No results found.   Additional studies/ records that were reviewed today include:  I have reviewed the patient's prior ER evaluation for cardioversion, A. fib clinic note, and evaluation by Ignacia Bayley, NP    ASSESSMENT:    1. OSA (obstructive sleep apnea)   2. Essential hypertension   3. Controlled type 2 diabetes mellitus without complication, without long-term current use of insulin ([place])   4. Morbid obesity ([place])   5. Hyperlipidemia with target LDL less than 70   6. Paroxysmal atrial fibrillation (HCC)   7. Current use of long term anticoagulation      PLAN:  Haley [name] is a 53 year old female who history of morbid obesity,  hypertension, paroxysmal atrial fibrillation, type 2 diabetes mellitus, and was diagnosed as having very severe sleep apnea.  She failed CPAP therapy and required maximum BiPAP therapy at 25/21 for optimal treatment.  She never instituted BiPAP therapy due to lack of insurance.  When I last saw her she was borrowing a friend's CPAP unit.  At that time she still had issues and was remaining sleepy felt as though she was not getting adequate air particularly in the middle of the night.  I believe she would benefit from a ResMed air curve 10 BiPAP auto unit.  We will see if there is any way she can get financial assistance and discussed potential even getting this online which may be at reduced cost through her DME company if she did not have any insurance.  She has lost weight since her initial evaluation and BMI today is 45.89 still consistent with morbid obesity.  I discussed the importance of exercise in addition to weight loss which would have positive benefit on her's severe sleep apnea.  Blood pressure today is stable on repeat by me was 120/70 on metoprolol 50 mg in the  morning and 25 mg in the evening.  She is unaware of any recurrent atrial fibrillation.  She is on Eliquis anticoagulation without bleeding.  She is diabetic on glipizide, metformin, and Trulicity.  Target LDL is less than 70 in this diabetic female and she continues to be on atorvastatin 20 mg.  Patient will be seeing her primary provider in several weeks.  I will see her back in the office hopefully after she gets a BiPAP unit

## 2019-08-07 ENCOUNTER — Encounter: Payer: Self-pay | Admitting: Cardiovascular Disease

## 2019-08-16 ENCOUNTER — Ambulatory Visit: Payer: Self-pay | Admitting: Nurse Practitioner

## 2019-08-16 NOTE — Addendum Note (Signed)
Addended by: Therisa Doyne on: 08/16/2019 11:35 AM   Modules accepted: Orders

## 2019-08-23 ENCOUNTER — Other Ambulatory Visit: Payer: Self-pay | Admitting: Critical Care Medicine

## 2019-08-23 MED FILL — !TRULICITY 1.5 MG/0.5 ML PE: 1.5 | 28 days supply | Qty: 2 | Fill #2

## 2019-08-23 MED FILL — METOPROLOL SUCCINATE ER 50: 50 | 30 days supply | Qty: 45 | Fill #1

## 2019-08-23 MED FILL — glipiZIDE 10 MG TABS: 10 | 30 days supply | Qty: 60 | Fill #0

## 2019-08-23 MED FILL — METFORMIN HCL ER 500 MG TB2: 500 | 30 days supply | Qty: 120 | Fill #1

## 2019-08-23 MED FILL — $ELIQUIS 5 MG TABLET: 5 | 30 days supply | Qty: 60 | Fill #3

## 2019-09-06 ENCOUNTER — Encounter: Payer: Self-pay | Admitting: Nurse Practitioner

## 2019-09-06 ENCOUNTER — Other Ambulatory Visit: Payer: Self-pay

## 2019-09-06 ENCOUNTER — Ambulatory Visit: Payer: Self-pay | Attending: Nurse Practitioner | Admitting: Nurse Practitioner

## 2019-09-06 ENCOUNTER — Ambulatory Visit (HOSPITAL_BASED_OUTPATIENT_CLINIC_OR_DEPARTMENT_OTHER): Payer: Self-pay | Admitting: Pharmacist

## 2019-09-06 VITALS — BP 119/73 | HR 73 | Temp 98.5°F | Ht 67.0 in | Wt 298.0 lb

## 2019-09-06 DIAGNOSIS — Z23 Encounter for immunization: Secondary | ICD-10-CM

## 2019-09-06 DIAGNOSIS — I10 Essential (primary) hypertension: Secondary | ICD-10-CM

## 2019-09-06 DIAGNOSIS — E1169 Type 2 diabetes mellitus with other specified complication: Secondary | ICD-10-CM

## 2019-09-06 DIAGNOSIS — E7800 Pure hypercholesterolemia, unspecified: Secondary | ICD-10-CM

## 2019-09-06 LAB — POCT GLYCOSYLATED HEMOGLOBIN (HGB A1C): Hemoglobin A1C: 7.3 % — AB (ref 4.0–5.6)

## 2019-09-06 LAB — GLUCOSE, POCT (MANUAL RESULT ENTRY): POC Glucose: 108 mg/dl — AB (ref 70–99)

## 2019-09-06 MED ORDER — METFORMIN HCL ER 500 MG PO TB24: 2000.0000 mg | ORAL_TABLET | Freq: Every day | ORAL | 3 refills | Status: DC

## 2019-09-06 MED ORDER — ATORVASTATIN CALCIUM 20 MG PO TABS: 20.0000 mg | ORAL_TABLET | Freq: Every day | ORAL | 3 refills | Status: DC

## 2019-09-06 MED ORDER — METOPROLOL SUCCINATE ER 50 MG PO TB24: ORAL_TABLET | ORAL | 3 refills | Status: DC

## 2019-09-06 MED FILL — ATORVASTATIN CALCIUM 20 MG: 20 | 30 days supply | Qty: 30 | Fill #0

## 2019-09-06 NOTE — Progress Notes (Signed)
Patient presents for vaccination against influenza per orders of Zelda. Consent given. Counseling provided. No contraindications exists. Vaccine administered without incident.   

## 2019-09-06 NOTE — Progress Notes (Signed)
Assessment & Plan:  Haley [name] was seen today for follow-up.  Diagnoses and all orders for this visit:  Type 2 diabetes mellitus with other specified complication, without long-term current use of insulin (HCC) -     Glucose (CBG) -     HgB A1c -     metFORMIN (GLUCOPHAGE-XR) 500 MG 24 hr tablet; Take 4 tablets (2,000 mg total) by mouth daily. Continue blood sugar control as discussed in office today, low carbohydrate diet, and regular physical exercise as tolerated, 150 minutes per week (30 min each day, 5 days per week, or 50 min 3 days per week). Keep blood sugar logs with fasting goal of 90-130 mg/dl, post prandial (after you eat) less than 180.  For Hypoglycemia: BS <60 and Hyperglycemia BS >400; contact the clinic ASAP. Annual eye exams and foot exams are recommended.   Essential hypertension -     Basic metabolic panel -     metoprolol succinate (TOPROL-XL) 50 MG 24 hr tablet; Take 50 mg (1 tablet) in the mornings and 25 mg (half tablet) in the evenings Continue all antihypertensives as prescribed.  Remember to bring in your blood pressure log with you for your follow up appointment.  DASH/Mediterranean Diets are healthier choices for HTN.    Pure hypercholesterolemia -     Lipid panel -     atorvastatin (LIPITOR) 20 MG tablet; Take 1 tablet (20 mg total) by mouth daily. INSTRUCTIONS: Work on a low fat, heart healthy diet and participate in regular aerobic exercise program by working out at least 150 minutes per week; 5 days a week-30 minutes per day. Avoid red meat, fried foods. junk foods, sodas, sugary drinks, unhealthy snacking, alcohol and smoking.  Drink at least 48oz of water per day and monitor your carbohydrate intake daily.      Patient has been counseled on age-appropriate routine health concerns for screening and prevention. These are reviewed and up-to-date. Referrals have been placed accordingly. Immunizations are up-to-date or declined.    Subjective:   Chief  Complaint  Patient presents with  . Follow-up    Pt. is here for 3 months follow up.    HPI Haley [name] 53 y.o. female presents to office today for follow up.   has a past medical history of Essential hypertension, Fibroids, Hyperthyroidism, Morbid obesity ([place]), Paroxysmal atrial fibrillation ([place]), Snores, and Type II diabetes mellitus ([place]).    She has been made aware of our financial assistance program on multiple occasions. As of today she still has not applied for the FA program.    Essential Hypertension Chronic and well controlled. She is not consistent with her diet in regard to decreased sodium intake. She is morbidly obese and not exercising. States she has one day off a week and work schedule too inconsistent to schedule a time to work out. She takes metoprolol 46m BID for blood pressure and rate control. Denies chest pain, shortness of breath, palpitations, lightheadedness, dizziness, headaches or visual disturbances.   BP Readings from Last 3 Encounters:  09/06/19 119/73  08/05/19 122/68  05/15/19 105/65    DM TYPE 2 Improved. Down from 9.2 to 7.3.  Mostly checking fasting glucose levels in the am and not postprandial. I have recommended she check twice per day. Once in the am and second reading should be after a meal. She is not diet compliant and consumes foods high in carbs and sweets weekly. Medications include: trulicity 1.5 mg weekly, glipizide 10 mg BID, metformin XR  2000 mg daily. Denies hyperglycemic symptoms such as neuropathy.  Lab Results  Component Value Date   HGBA1C 7.3 (A) 09/06/2019   Lab Results  Component Value Date   HGBA1C 9.2 (A) 05/15/2019    Mixed Hyperlipidemia LDL not at goal of <70. Risk score 115. She initially had not been taking atorvastatin. After discussing her risk score today she is agreeable to start. She denies any statin intolerance or myalgias.  Lab Results  Component Value Date   LDLCALC 82 09/06/2019    Review of  Systems  Constitutional: Negative for fever, malaise/fatigue and weight loss.  HENT: Negative.  Negative for nosebleeds.   Eyes: Negative.  Negative for blurred vision, double vision and photophobia.  Respiratory: Negative.  Negative for cough and shortness of breath.   Cardiovascular: Negative.  Negative for chest pain, palpitations and leg swelling.  Gastrointestinal: Negative.  Negative for heartburn, nausea and vomiting.  Musculoskeletal: Negative.  Negative for myalgias.  Neurological: Negative.  Negative for dizziness, focal weakness, seizures and headaches.  Psychiatric/Behavioral: Negative.  Negative for suicidal ideas.    Past Medical History:  Diagnosis Date  . Essential hypertension   . Fibroids   . Hyperthyroidism    a. 2010, treated w/ tapazole.  . Morbid obesity (HCC)   . Paroxysmal atrial fibrillation (HCC)    a. 03/2009 - in setting of hyperthyroidism and caffeine intake, converted spontaneously;  b. 08/2016 ED visit for recurrent PAF-->successful DCCV in ED.  . Snores   . Type II diabetes mellitus (HCC)     Past Surgical History:  Procedure Laterality Date  . UTERINE FIBROID SURGERY      Family History  Problem Relation Age of Onset  . Hypertension Mother   . Diabetes Mellitus II Sister   . Cancer Maternal Grandmother     Social History Reviewed with no changes to be made today.   Outpatient Medications Prior to Visit  Medication Sig Dispense Refill  . acetaminophen (TYLENOL 8 HOUR ARTHRITIS PAIN) 650 MG CR tablet Take 650 mg by mouth every 8 (eight) hours as needed for pain.    . apixaban (ELIQUIS) 5 MG TABS tablet Take 1 tablet (5 mg total) by mouth 2 (two) times daily. 60 tablet 3  . BLACK CURRANT SEED OIL PO Take 2 capsules by mouth daily.    . Blood Glucose Monitoring Suppl (TRUE METRIX METER) w/Device KIT Use twice daily 1 kit 0  . Dulaglutide (TRULICITY) 1.5 MG/0.5ML SOPN Inject 1.5 mg into the skin once a week. 2 mL 2  . glipiZIDE (GLUCOTROL) 10 MG  tablet TAKE 1 TABLET (10 MG TOTAL) BY MOUTH 2 (TWO) TIMES DAILY BEFORE A MEAL. 60 tablet 2  . glucose blood (TRUE METRIX BLOOD GLUCOSE TEST) test strip Use as instructed 100 each 12  . Multiple Vitamins-Minerals (ALIVE WOMENS 50+) TABS Take 1 tablet by mouth daily.    . TRUEplus Lancets 28G MISC Use twice daily for blood glucose check 100 each 1  . Aspirin-Caffeine (BC FAST PAIN RELIEF ARTHRITIS PO) Take by mouth as needed.    . metFORMIN (GLUCOPHAGE-XR) 500 MG 24 hr tablet Take 4 tablets (2,000 mg total) by mouth daily. 120 tablet 2  . metoprolol succinate (TOPROL-XL) 50 MG 24 hr tablet Take 50 mg (1 tablet) in the mornings and 25 mg (half tablet) in the evenings 45 tablet 2  . BIOTIN PO Take 2 tablets by mouth daily.    . diclofenac sodium (VOLTAREN) 1 % GEL Apply 4 g topically   4 (four) times daily as needed. (Patient not taking: Reported on 09/06/2019) 100 g 3  . Glucosamine HCl (GLUCOSAMINE PO) Take 2 tablets by mouth daily.    . Methylsulfonylmethane (MSM PO) Take 2 tablets by mouth daily.    . atorvastatin (LIPITOR) 20 MG tablet Take 1 tablet (20 mg total) by mouth daily. (Patient not taking: Reported on 09/06/2019) 90 tablet 3   No facility-administered medications prior to visit.     No Known Allergies     Objective:    BP 119/73 (BP Location: Left Arm, Patient Position: Sitting, Cuff Size: Large)   Pulse 73   Temp 98.5 F (36.9 C) (Oral)   Ht 5' 7" (1.702 m)   Wt 298 lb (135.2 kg)   SpO2 94%   BMI 46.67 kg/m  Wt Readings from Last 3 Encounters:  09/06/19 298 lb (135.2 kg)  08/05/19 293 lb (132.9 kg)  05/15/19 294 lb 9.6 oz (133.6 kg)    Physical Exam Vitals signs and nursing note reviewed.  Constitutional:      Appearance: She is well-developed.  HENT:     Head: Normocephalic and atraumatic.  Neck:     Musculoskeletal: Normal range of motion.  Cardiovascular:     Rate and Rhythm: Normal rate and regular rhythm.     Heart sounds: Normal heart sounds. No murmur. No  friction rub. No gallop.   Pulmonary:     Effort: Pulmonary effort is normal. No tachypnea or respiratory distress.     Breath sounds: Normal breath sounds. No decreased breath sounds, wheezing, rhonchi or rales.  Chest:     Chest wall: No tenderness.  Abdominal:     General: Bowel sounds are normal.     Palpations: Abdomen is soft.  Musculoskeletal: Normal range of motion.  Skin:    General: Skin is warm and dry.  Neurological:     Mental Status: She is alert and oriented to person, place, and time.     Coordination: Coordination normal.  Psychiatric:        Behavior: Behavior normal. Behavior is cooperative.        Thought Content: Thought content normal.        Judgment: Judgment normal.          Patient has been counseled extensively about nutrition and exercise as well as the importance of adherence with medications and regular follow-up. The patient was given clear instructions to go to ER or return to medical center if symptoms don't improve, worsen or new problems develop. The patient verbalized understanding.   Follow-up: Return for PAP SMEAR.   Zelda W Fleming, FNP-BC [facility] [place], [place] [phone]   09/07/2019, 4:19 PM 

## 2019-09-07 ENCOUNTER — Encounter: Payer: Self-pay | Admitting: Nurse Practitioner

## 2019-09-07 LAB — BASIC METABOLIC PANEL
BUN/Creatinine Ratio: 16 (ref 9–23)
BUN: 9 mg/dL (ref 6–24)
CO2: 29 mmol/L (ref 20–29)
Calcium: 9.9 mg/dL (ref 8.7–10.2)
Chloride: 99 mmol/L (ref 96–106)
Creatinine, Ser: 0.55 mg/dL — ABNORMAL LOW (ref 0.57–1.00)
GFR calc Af Amer: 124 mL/min/{1.73_m2} (ref >59)
GFR calc non Af Amer: 107 mL/min/{1.73_m2} (ref >59)
Glucose: 139 mg/dL — ABNORMAL HIGH (ref 65–99)
Potassium: 4.7 mmol/L (ref 3.5–5.2)
Sodium: 144 mmol/L (ref 134–144)

## 2019-09-07 LAB — LIPID PANEL
Chol/HDL Ratio: 3.1 ratio (ref 0.0–4.4)
Cholesterol, Total: 153 mg/dL (ref 100–199)
HDL: 50 mg/dL (ref >39)
LDL Chol Calc (NIH): 82 mg/dL (ref 0–99)
Triglycerides: 116 mg/dL (ref 0–149)
VLDL Cholesterol Cal: 21 mg/dL (ref 5–40)

## 2019-09-25 ENCOUNTER — Other Ambulatory Visit: Payer: Self-pay | Admitting: Family Medicine

## 2019-09-25 ENCOUNTER — Other Ambulatory Visit: Payer: Self-pay

## 2019-09-25 DIAGNOSIS — E1169 Type 2 diabetes mellitus with other specified complication: Secondary | ICD-10-CM

## 2019-09-25 MED ORDER — TRULICITY 1.5 MG/0.5ML ~~LOC~~ SOAJ: 1.5000 mg | SUBCUTANEOUS | 2 refills | Status: DC

## 2019-09-25 NOTE — Telephone Encounter (Signed)
SENT RX TO [facility] FOR PT PASS-SAME AS WHAT WAS ON APPLICATION THAT WAS AUTHORIZED BY ZELDA ON 09/19/19

## 2019-09-26 MED FILL — TRULICITY 1.5 MG/0.5 ML PEN: 1.5 | 30 days supply | Qty: 2 | Fill #0

## 2019-09-30 MED FILL — ATORVASTATIN CALCIUM 20 MG: 20 | 30 days supply | Qty: 30 | Fill #1

## 2019-10-07 MED FILL — METFORMIN HCL ER 500 MG TB2: 500 | 30 days supply | Qty: 120 | Fill #2

## 2019-10-07 MED FILL — METOPROLOL SUCCINATE ER 50: 50 | 30 days supply | Qty: 45 | Fill #2

## 2019-10-23 MED FILL — $TRULICITY 1.5 MG/0.5 ML PE: 1.5 | 28 days supply | Qty: 2 | Fill #1

## 2019-10-29 ENCOUNTER — Other Ambulatory Visit: Payer: Self-pay | Admitting: Nurse Practitioner

## 2019-11-06 ENCOUNTER — Other Ambulatory Visit: Payer: Self-pay | Admitting: Critical Care Medicine

## 2019-11-07 MED FILL — METOPROLOL SUCCINATE ER 50: 50 | 30 days supply | Qty: 45 | Fill #0

## 2019-11-07 MED FILL — METFORMIN HCL ER 500 MG TB2: 500 | 30 days supply | Qty: 120 | Fill #0

## 2019-11-07 MED FILL — $ELIQUIS 5 MG TABLET: 5 | 90 days supply | Qty: 180 | Fill #0

## 2019-11-07 MED FILL — ATORVASTATIN CALCIUM 20 MG: 20 | 30 days supply | Qty: 30 | Fill #2

## 2019-11-11 ENCOUNTER — Other Ambulatory Visit: Payer: Self-pay | Admitting: Nurse Practitioner

## 2019-11-14 ENCOUNTER — Other Ambulatory Visit: Payer: Self-pay

## 2019-11-14 ENCOUNTER — Ambulatory Visit: Payer: Self-pay | Attending: Nurse Practitioner | Admitting: Physician Assistant

## 2019-11-14 VITALS — BP 114/72 | HR 85 | Temp 98.0°F | Wt 295.2 lb

## 2019-11-14 DIAGNOSIS — Z113 Encounter for screening for infections with a predominantly sexual mode of transmission: Secondary | ICD-10-CM

## 2019-11-14 DIAGNOSIS — R350 Frequency of micturition: Secondary | ICD-10-CM

## 2019-11-14 DIAGNOSIS — Z124 Encounter for screening for malignant neoplasm of cervix: Secondary | ICD-10-CM

## 2019-11-14 DIAGNOSIS — N898 Other specified noninflammatory disorders of vagina: Secondary | ICD-10-CM

## 2019-11-14 DIAGNOSIS — E1169 Type 2 diabetes mellitus with other specified complication: Secondary | ICD-10-CM

## 2019-11-14 LAB — POCT URINALYSIS DIP (CLINITEK)
Bilirubin, UA: NEGATIVE
Blood, UA: NEGATIVE
Glucose, UA: NEGATIVE mg/dL
Ketones, POC UA: NEGATIVE mg/dL
Leukocytes, UA: NEGATIVE
Nitrite, UA: NEGATIVE
POC PROTEIN,UA: 100 — AB
Spec Grav, UA: >=1.03 — AB (ref 1.010–1.025)
Urobilinogen, UA: 0.2 E.U./dL
pH, UA: 6 (ref 5.0–8.0)

## 2019-11-14 LAB — GLUCOSE, POCT (MANUAL RESULT ENTRY): POC Glucose: 120 mg/dl — AB (ref 70–99)

## 2019-11-14 NOTE — Progress Notes (Signed)
Patient ID: Haley [name], female   DOB: [birth year], 53 y.o.   MRN: [id number]   Haley [name], is a 53 y.o. female  WSF:[id number]  [id number]:[id number]  DOB - [birth year]  Subjective:  Chief Complaint and HPI: Haley [name] is a 53 y.o. female here today for papa and vaginal dryness.  She also wants to get STD testing.  She is married and monogamous.  Also having some urinary frequency w/o dysuria.  Denies f/c/abdominal or pelvic pain.   Blood sugars doing pretty good OOO.   ROS:   Constitutional:  No f/c, No night sweats, No unexplained weight loss. EENT:  No vision changes, No blurry vision, No hearing changes. No mouth, throat, or ear problems.  Respiratory: No cough, No SOB Cardiac: No CP, no palpitations GI:  No abd pain, No N/V/D. GU: No Urinary s/sx Musculoskeletal: No joint pain Neuro: No headache, no dizziness, no motor weakness.  Skin: No rash Endocrine:  No polydipsia. No polyuria.  Psych: Denies SI/HI  No problems updated.  ALLERGIES: No Known Allergies  PAST MEDICAL HISTORY: Past Medical History:  Diagnosis Date  . Essential hypertension   . Fibroids   . Hyperthyroidism    a. 2010, treated w/ tapazole.  . Morbid obesity ([place])   . Paroxysmal atrial fibrillation ([place])    a. 03/2009 - in setting of hyperthyroidism and caffeine intake, converted spontaneously;  b. 08/2016 ED visit for recurrent PAF-->successful DCCV in ED.  [name] Snores   . Type II diabetes mellitus ([place])     MEDICATIONS AT HOME: Prior to Admission medications   Medication Sig Start Date End Date Taking? Authorizing Provider  acetaminophen (TYLENOL 8 HOUR ARTHRITIS PAIN) 650 MG CR tablet Take 650 mg by mouth every 8 (eight) hours as needed for pain.   Yes [provider]  atorvastatin (LIPITOR) 20 MG tablet Take 1 tablet (20 mg total) by mouth daily. 09/06/19  Yes [name], NP  BIOTIN PO Take 2 tablets by mouth daily.   Yes [provider]  BLACK CURRANT SEED OIL  PO Take 2 capsules by mouth daily.   Yes [provider]  Blood Glucose Monitoring Suppl (TRUE METRIX METER) w/Device KIT Use twice daily 02/19/19  Yes Elsie Stain, MD  Dulaglutide (TRULICITY) 1.5 FM/3.8GY SOPN Inject 1.5 mg into the skin once a week. 09/25/19  Yes Fleming, Vernia Buff, NP  ELIQUIS 5 MG TABS tablet TAKE 1 TABLET (5 MG TOTAL) BY MOUTH 2 (TWO) TIMES DAILY. 11/06/19  Yes Newlin, Enobong, MD  glipiZIDE (GLUCOTROL) 10 MG tablet TAKE 1 TABLET (10 MG TOTAL) BY MOUTH 2 (TWO) TIMES DAILY BEFORE A MEAL. 08/23/19  Yes Elsie Stain, MD  Glucosamine HCl (GLUCOSAMINE PO) Take 2 tablets by mouth daily.   Yes [provider]  glucose blood (TRUE METRIX BLOOD GLUCOSE TEST) test strip Use as instructed 05/15/19  Yes [name], NP  metFORMIN (GLUCOPHAGE-XR) 500 MG 24 hr tablet Take 4 tablets (2,000 mg total) by mouth daily. 09/06/19  Yes [name], NP  Methylsulfonylmethane (MSM PO) Take 2 tablets by mouth daily.   Yes [provider]  metoprolol succinate (TOPROL-XL) 50 MG 24 hr tablet Take 50 mg (1 tablet) in the mornings and 25 mg (half tablet) in the evenings 09/06/19  Yes [name], NP  Multiple Vitamins-Minerals (ALIVE WOMENS 50+) TABS Take 1 tablet by mouth daily.   Yes [provider]  TRUEplus Lancets 28G MISC Use twice daily for blood glucose  check 02/19/19  Yes Elsie Stain, MD  diclofenac sodium (VOLTAREN) 1 % GEL Apply 4 g topically 4 (four) times daily as needed. Patient not taking: Reported on 09/06/2019 05/15/19   [name], NP     Objective:  EXAM:   Vitals:   11/14/19 1344  BP: 114/72  Pulse: 85  Temp: 98 F (36.7 C)  TempSrc: Oral  SpO2: 90%  Weight: 295 lb 3.2 oz (133.9 kg)    General appearance : A&OX3. NAD. Non-toxic-appearing HEENT: Atraumatic and Normocephalic.  PERRLA. EOM intact.   Chest/Lungs:  Breathing-non-labored, Good air entry bilaterally, breath sounds normal without rales, rhonchi, or  wheezing  CVS: S1 S2 regular, no murmurs, gallops, rubs  GU:  External genitalia WNL, some dryness in vagina with slight vaginal atrophy, but speculum advanced w/o problem.  Cervix visualized, pap and swabs for ancillary testing taken.  Bimanual exam unremarkable but limited by obesity.   Extremities: Bilateral Lower Ext shows no edema, both legs are warm to touch with = pulse throughout Neurology:  CN II-XII grossly intact, Non focal.   Psych:  TP linear. J/I WNL. Normal speech. Appropriate eye contact and affect.  Skin:  No Rash  Data Review Lab Results  Component Value Date   HGBA1C 7.3 (A) 09/06/2019   HGBA1C 9.2 (A) 05/15/2019   HGBA1C 10.1 (H) 01/02/2019     Assessment & Plan   1. Type 2 diabetes mellitus with other specified complication, without long-term current use of insulin (HCC) Wasn't at goal but was improving at last visit.  Continue current med regimen and decrease sugar/white carbohydrate intake - Glucose (CBG)  2. Screening for cervical cancer - Cervicovaginal ancillary only - Cytology - PAP([name])  3. Routine screening for STI (sexually transmitted infection) - POCT URINALYSIS DIP ([facility]) - Cervicovaginal ancillary only - Cytology - PAP([name]) - Urine culture  4. Urinary frequency  - POCT URINALYSIS DIP (CLIN[facility]K) - Urine culture  5. Vaginal dryness replens 2 nights weekly and astroglide with intercourse/SA   Patient have been counseled extensively about nutrition and exercise  Return in about 3 months (around 02/12/2020) for with PCP;  chronic conditions.  The patient was given clear instructions to go to ER or return to medical center if symptoms don't improve, worsen or new problems develop. The patient verbalized understanding. The patient was told to call to get lab results if they haven't heard anything in the next week.     Freeman Caldron, PA-C [facility], [place]     11/14/2019, 2:24 PM

## 2019-11-14 NOTE — Patient Instructions (Signed)
replens-apply at bedtime mon and Thursday nights Astroglide with purple lid

## 2019-11-15 LAB — CERVICOVAGINAL ANCILLARY ONLY
Bacterial Vaginitis (gardnerella): NEGATIVE
Candida Glabrata: NEGATIVE
Candida Vaginitis: NEGATIVE
Chlamydia: NEGATIVE
Comment: NEGATIVE
Comment: NEGATIVE
Comment: NEGATIVE
Comment: NEGATIVE
Comment: NEGATIVE
Comment: NORMAL
Neisseria Gonorrhea: NEGATIVE
Trichomonas: NEGATIVE

## 2019-11-16 LAB — URINE CULTURE: Organism ID, Bacteria: NO GROWTH

## 2019-11-19 LAB — CYTOLOGY - PAP
Comment: NEGATIVE
Diagnosis: NEGATIVE
High risk HPV: NEGATIVE

## 2019-11-20 ENCOUNTER — Telehealth (INDEPENDENT_AMBULATORY_CARE_PROVIDER_SITE_OTHER): Payer: Self-pay

## 2019-11-20 NOTE — Telephone Encounter (Signed)
-----   Message from [place] Donovan, [place] sent at 11/20/2019 10:20 AM EST ----- Pap smear was normal.  Follow-up as planned.  Thanks, Freeman Caldron, PA-C

## 2019-11-20 NOTE — Telephone Encounter (Signed)
Patient verified date of birth. She is aware that pap is normal, STD negative. Follow up with PCP. She expressed understanding. Nat Christen, CMA

## 2019-11-26 ENCOUNTER — Other Ambulatory Visit: Payer: Self-pay | Admitting: Family Medicine

## 2019-11-26 DIAGNOSIS — E1169 Type 2 diabetes mellitus with other specified complication: Secondary | ICD-10-CM

## 2019-11-26 MED FILL — glipiZIDE 10 MG TABS: 10 | 30 days supply | Qty: 60 | Fill #1

## 2019-11-26 MED FILL — $TRULICITY 1.5 MG/0.5 ML PE: 1.5 | 28 days supply | Qty: 2 | Fill #0

## 2019-12-16 MED FILL — METOPROLOL SUCCINATE ER 50: 50 | 30 days supply | Qty: 45 | Fill #1

## 2019-12-16 MED FILL — ATORVASTATIN CALCIUM 20 MG: 20 | 30 days supply | Qty: 30 | Fill #3

## 2019-12-21 ENCOUNTER — Encounter (HOSPITAL_COMMUNITY): Payer: Self-pay | Admitting: Emergency Medicine

## 2019-12-21 ENCOUNTER — Other Ambulatory Visit: Payer: Self-pay

## 2019-12-21 ENCOUNTER — Emergency Department (HOSPITAL_COMMUNITY): Payer: Self-pay

## 2019-12-21 ENCOUNTER — Inpatient Hospital Stay (HOSPITAL_COMMUNITY)
Admission: EM | Admit: 2019-12-21 | Discharge: 2019-12-24 | DRG: 291 | Disposition: A | Discharge status: Home / Self Care | Payer: Self-pay | Attending: Cardiovascular Disease | Admitting: Cardiovascular Disease

## 2019-12-21 DIAGNOSIS — Z833 Family history of diabetes mellitus: Secondary | ICD-10-CM

## 2019-12-21 DIAGNOSIS — I5031 Acute diastolic (congestive) heart failure: Secondary | ICD-10-CM | POA: Diagnosis present

## 2019-12-21 DIAGNOSIS — E785 Hyperlipidemia, unspecified: Secondary | ICD-10-CM | POA: Diagnosis present

## 2019-12-21 DIAGNOSIS — E0500 Thyrotoxicosis with diffuse goiter without thyrotoxic crisis or storm: Secondary | ICD-10-CM | POA: Diagnosis present

## 2019-12-21 DIAGNOSIS — G478 Other sleep disorders: Secondary | ICD-10-CM

## 2019-12-21 DIAGNOSIS — E119 Type 2 diabetes mellitus without complications: Secondary | ICD-10-CM | POA: Diagnosis present

## 2019-12-21 DIAGNOSIS — E0590 Thyrotoxicosis, unspecified without thyrotoxic crisis or storm: Secondary | ICD-10-CM

## 2019-12-21 DIAGNOSIS — Z87891 Personal history of nicotine dependence: Secondary | ICD-10-CM

## 2019-12-21 DIAGNOSIS — Z7984 Long term (current) use of oral hypoglycemic drugs: Secondary | ICD-10-CM

## 2019-12-21 DIAGNOSIS — Z7901 Long term (current) use of anticoagulants: Secondary | ICD-10-CM

## 2019-12-21 DIAGNOSIS — I10 Essential (primary) hypertension: Secondary | ICD-10-CM

## 2019-12-21 DIAGNOSIS — I509 Heart failure, unspecified: Secondary | ICD-10-CM

## 2019-12-21 DIAGNOSIS — Z79899 Other long term (current) drug therapy: Secondary | ICD-10-CM

## 2019-12-21 DIAGNOSIS — G4730 Sleep apnea, unspecified: Secondary | ICD-10-CM

## 2019-12-21 DIAGNOSIS — Z8249 Family history of ischemic heart disease and other diseases of the circulatory system: Secondary | ICD-10-CM

## 2019-12-21 DIAGNOSIS — I110 Hypertensive heart disease with heart failure: Principal | ICD-10-CM | POA: Diagnosis present

## 2019-12-21 DIAGNOSIS — I480 Paroxysmal atrial fibrillation: Secondary | ICD-10-CM | POA: Diagnosis present

## 2019-12-21 DIAGNOSIS — J9601 Acute respiratory failure with hypoxia: Secondary | ICD-10-CM | POA: Diagnosis present

## 2019-12-21 DIAGNOSIS — R0902 Hypoxemia: Secondary | ICD-10-CM | POA: Diagnosis present

## 2019-12-21 DIAGNOSIS — G4733 Obstructive sleep apnea (adult) (pediatric): Secondary | ICD-10-CM | POA: Diagnosis present

## 2019-12-21 DIAGNOSIS — Z20822 Contact with and (suspected) exposure to covid-19: Secondary | ICD-10-CM | POA: Diagnosis present

## 2019-12-21 LAB — I-STAT BETA HCG BLOOD, ED (MC, WL, AP ONLY): I-stat hCG, quantitative: <5 m[IU]/mL (ref <5)

## 2019-12-21 LAB — BASIC METABOLIC PANEL
Anion gap: 9 (ref 5–15)
BUN: 11 mg/dL (ref 6–20)
CO2: 30 mmol/L (ref 22–32)
Calcium: 9 mg/dL (ref 8.9–10.3)
Chloride: 102 mmol/L (ref 98–111)
Creatinine, Ser: 0.52 mg/dL (ref 0.44–1.00)
GFR calc Af Amer: >60 mL/min (ref >60)
GFR calc non Af Amer: >60 mL/min (ref >60)
Glucose, Bld: 237 mg/dL — ABNORMAL HIGH (ref 70–99)
Potassium: 4 mmol/L (ref 3.5–5.1)
Sodium: 141 mmol/L (ref 135–145)

## 2019-12-21 LAB — CBC
HCT: 45.6 % (ref 36.0–46.0)
Hemoglobin: 13.7 g/dL (ref 12.0–15.0)
MCH: 28.7 pg (ref 26.0–34.0)
MCHC: 30 g/dL (ref 30.0–36.0)
MCV: 95.6 fL (ref 80.0–100.0)
Platelets: 246 10*3/uL (ref 150–400)
RBC: 4.77 MIL/uL (ref 3.87–5.11)
RDW: 13.7 % (ref 11.5–15.5)
WBC: 9.9 10*3/uL (ref 4.0–10.5)
nRBC: 0 % (ref 0.0–0.2)

## 2019-12-21 LAB — TSH: TSH: 0.01 u[IU]/mL — ABNORMAL LOW (ref 0.350–4.500)

## 2019-12-21 LAB — TROPONIN I (HIGH SENSITIVITY)
Troponin I (High Sensitivity): 10 ng/L (ref <18)
Troponin I (High Sensitivity): 7 ng/L (ref <18)

## 2019-12-21 LAB — SARS CORONAVIRUS 2 (TAT 6-24 HRS): SARS Coronavirus 2: NEGATIVE

## 2019-12-21 LAB — BRAIN NATRIURETIC PEPTIDE: B Natriuretic Peptide: 205.8 pg/mL — ABNORMAL HIGH (ref 0.0–100.0)

## 2019-12-21 MED ORDER — SODIUM CHLORIDE 0.9% FLUSH
3.0000 mL | Freq: Two times a day (BID) | INTRAVENOUS | Status: DC
  Administered 2019-12-22 – 2019-12-24 (×5): 3 mL via INTRAVENOUS

## 2019-12-21 MED ORDER — FUROSEMIDE 10 MG/ML IJ SOLN
20.0000 mg | Freq: Once | INTRAMUSCULAR | Status: AC
  Administered 2019-12-21: 20 mg via INTRAVENOUS
  Filled 2019-12-21: qty 2

## 2019-12-21 MED ORDER — SODIUM CHLORIDE 0.9 % IV SOLN: 250.0000 mL | INTRAVENOUS | Status: DC | PRN

## 2019-12-21 MED ORDER — APIXABAN 5 MG PO TABS
5.0000 mg | ORAL_TABLET | Freq: Two times a day (BID) | ORAL | Status: DC
  Administered 2019-12-22 – 2019-12-24 (×6): 5 mg via ORAL
  Filled 2019-12-21 (×7): qty 1

## 2019-12-21 MED ORDER — SODIUM CHLORIDE 0.9% FLUSH: 3.0000 mL | Freq: Once | INTRAVENOUS | Status: DC

## 2019-12-21 MED ORDER — METOPROLOL SUCCINATE ER 50 MG PO TB24
50.0000 mg | ORAL_TABLET | Freq: Every day | ORAL | Status: DC
  Administered 2019-12-21 – 2019-12-23 (×3): 50 mg via ORAL
  Filled 2019-12-21: qty 1
  Filled 2019-12-21: qty 2
  Filled 2019-12-21: qty 1

## 2019-12-21 MED ORDER — SODIUM CHLORIDE 0.9% FLUSH: 3.0000 mL | INTRAVENOUS | Status: DC | PRN

## 2019-12-21 MED ORDER — ACETAMINOPHEN 325 MG PO TABS
650.0000 mg | ORAL_TABLET | ORAL | Status: DC | PRN
  Administered 2019-12-22 – 2019-12-24 (×7): 650 mg via ORAL
  Filled 2019-12-21 (×7): qty 2

## 2019-12-21 MED ORDER — ATORVASTATIN CALCIUM 10 MG PO TABS
20.0000 mg | ORAL_TABLET | Freq: Every day | ORAL | Status: DC
  Administered 2019-12-21 – 2019-12-24 (×4): 20 mg via ORAL
  Filled 2019-12-21 (×4): qty 2

## 2019-12-21 NOTE — ED Notes (Signed)
Patient transported to X-ray 

## 2019-12-21 NOTE — ED Provider Notes (Signed)
[facility] EMERGENCY DEPARTMENT Provider Note   CSN: [id number] Arrival date & time: 12/21/19  1412     History Chief Complaint  Patient presents with  . Chest Pain    Haley [name] is a 54 y.o. female.  The history is provided by the patient and medical records. No language interpreter was used.  Chest Pain  Haley [name] is a 54 y.o. female who presents to the Emergency Department complaining of sob.  She presents to the ED complaining of two weeks of progressive sob, hearing her blood swishing in her ears.  She has associated central chest pain described as a tightness sensation that is constant in nature.  She cannot sleep.  She cannot lay flat due to sob. She also has DOE.  She also has bilateral ankle swelling.    Denies fever, cough, abdominal pain, nausea, vomiting, diarrhea. No loss of taste/smell.  No known COVID19 exposures.      Past Medical History:  Diagnosis Date  . Essential hypertension   . Fibroids   . Hyperthyroidism    a. 2010, treated w/ tapazole.  . Morbid obesity ([place])   . Paroxysmal atrial fibrillation ([place])    a. 03/2009 - in setting of hyperthyroidism and caffeine intake, converted spontaneously;  b. 08/2016 ED visit for recurrent PAF-->successful DCCV in ED.  [name] Snores   . Type II diabetes mellitus [facility])     Patient Active Problem List   Diagnosis Date Noted  . Acute diastolic (congestive) heart failure ([place]) 12/21/2019  . Hypoxia 12/21/2019  . 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 Current use of long term anticoagulation 02/19/2019  . Colon cancer screening 02/19/2019  . Pure hypercholesterolemia 02/19/2019  . Breast cancer screening 02/19/2019  . Obesity, Class III, BMI 40-49.9 (morbid obesity) ([place]) 01/27/2019  . Hypomagnesemia 01/27/2019  . Type II diabetes mellitus ([place]) 01/01/2019  . Severe obstructive sleep apnea 01/14/2017  . Hypoxemia 01/14/2017  . Paroxysmal atrial fibrillation (HCC)   . Essential  hypertension     Past Surgical History:  Procedure Laterality Date  . UTERINE FIBROID SURGERY       OB History   No obstetric history on file.     Family History  Problem Relation Age of Onset  . Hypertension Mother   . Diabetes Mellitus II Sister   . Cancer Maternal Grandmother     Social History   Tobacco Use  . Smoking status: Former Smoker    Packs/day: 1.00    Years: 32.00    Pack years: 32.00    Quit date: 2012    Years since quitting: 9.0  . Smokeless tobacco: Never Used  . Tobacco comment: patient vapes  Substance Use Topics  . Alcohol use: No    Comment: occasional drink.  . Drug use: No    Home Medications Prior to Admission medications   Medication Sig Start Date End Date Taking? Authorizing Provider  acetaminophen (TYLENOL 8 HOUR ARTHRITIS PAIN) 650 MG CR tablet Take 650 mg by mouth every 8 (eight) hours as needed for pain.   Yes [provider]  atorvastatin (LIPITOR) 20 MG tablet Take 1 tablet (20 mg total) by mouth daily. 09/06/19  Yes [name], NP  Blood Glucose Monitoring Suppl (TRUE METRIX METER) w/Device KIT Use twice daily 02/19/19  Yes Elsie Stain, MD  diclofenac sodium (VOLTAREN) 1 % GEL Apply 4 g topically 4 (four) times daily as needed. 05/15/19  Yes [name], NP  ELIQUIS 5 MG TABS tablet TAKE 1 TABLET (5 MG TOTAL) BY MOUTH 2 (TWO) TIMES DAILY. Patient taking differently: Take 5 mg by mouth 2 (two) times daily.  11/06/19  Yes Newlin, Enobong, MD  glipiZIDE (GLUCOTROL) 10 MG tablet TAKE 1 TABLET (10 MG TOTAL) BY MOUTH 2 (TWO) TIMES DAILY BEFORE A MEAL. Patient taking differently: Take 10 mg by mouth daily.  08/23/19  Yes Elsie Stain, MD  glucose blood (TRUE METRIX BLOOD GLUCOSE TEST) test strip Use as instructed 05/15/19  Yes [name], NP  metFORMIN (GLUCOPHAGE-XR) 500 MG 24 hr tablet Take 4 tablets (2,000 mg total) by mouth daily. Patient taking differently: Take 2,000 mg by mouth at bedtime.  09/06/19   Yes [name], NP  Methylsulfonylmethane (MSM PO) Take 2 tablets by mouth daily.   Yes [provider]  metoprolol succinate (TOPROL-XL) 50 MG 24 hr tablet Take 50 mg (1 tablet) in the mornings and 25 mg (half tablet) in the evenings Patient taking differently: Take 50 mg by mouth daily.  09/06/19  Yes [name], NP  Multiple Vitamins-Minerals (ALIVE WOMENS 50+) TABS Take 1 tablet by mouth daily.   Yes [provider]  TRUEplus Lancets 28G MISC Use twice daily for blood glucose check 02/19/19  Yes Elsie Stain, MD  TRULICITY 1.5 XI/3.3AS SOPN INJECT 1.5 MG INTO THE SKIN ONCE A WEEK. Patient taking differently: Inject 1.5 mg into the skin once a week. Mondays 11/26/19  Yes Charlott Rakes, MD  TURMERIC PO Take 1 tablet by mouth daily.   Yes [provider]    Allergies    Patient has no known allergies.  Review of Systems   Review of Systems  Cardiovascular: Positive for chest pain.  All other systems reviewed and are negative.   Physical Exam Updated Vital Signs BP (!) 148/77   Pulse 82   Temp 99.2 F (37.3 C) (Oral)   Resp 19   SpO2 94%   Physical Exam Vitals and nursing note reviewed.  Constitutional:      Appearance: She is well-developed.  HENT:     Head: Normocephalic and atraumatic.  Cardiovascular:     Rate and Rhythm: Normal rate and regular rhythm.     Heart sounds: No murmur.  Pulmonary:     Effort: Pulmonary effort is normal. No respiratory distress.     Breath sounds: Normal breath sounds.  Abdominal:     Palpations: Abdomen is soft.     Tenderness: There is no abdominal tenderness. There is no guarding or rebound.  Musculoskeletal:        General: No tenderness.     Comments: 2+ pitting edema to BLE  Skin:    General: Skin is warm and dry.  Neurological:     Mental Status: She is alert and oriented to person, place, and time.  Psychiatric:        Behavior: Behavior normal.     ED Results / Procedures /  Treatments   Labs (all labs ordered are listed, but only abnormal results are displayed) Labs Reviewed  BASIC METABOLIC PANEL - Abnormal; Notable for the following components:      Result Value   Glucose, Bld 237 (*)    All other components within normal limits  BRAIN NATRIURETIC PEPTIDE - Abnormal; Notable for the following components:   B Natriuretic Peptide 205.8 (*)    All other components within normal limits  TSH - Abnormal; Notable for the following components:  TSH 0.010 (*)    All other components within normal limits  CBG MONITORING, ED - Abnormal; Notable for the following components:   Glucose-Capillary 238 (*)    All other components within normal limits  SARS CORONAVIRUS 2 (TAT 6-24 HRS)  CBC  BASIC METABOLIC PANEL  MAGNESIUM  HEMOGLOBIN A1C  T4, FREE  I-STAT BETA HCG BLOOD, ED ([facility], [facility], [facility] ONLY)  TROPONIN I (HIGH SENSITIVITY)  TROPONIN I (HIGH SENSITIVITY)    EKG EKG Interpretation  Date/Time:  Saturday December 21 2019 14:20:05 EST Ventricular Rate:  82 PR Interval:  138 QRS Duration: 78 QT Interval:  368 QTC Calculation: 429 R Axis:   97 Text Interpretation: Normal sinus rhythm Rightward axis Borderline ECG Confirmed by Quintella Reichert [phone]) on 12/21/2019 2:55:36 PM   Radiology DG Chest 2 View  Result Date: 12/21/2019 CLINICAL DATA:  Chest pain, shortness of breath for 1 week, history of AFib EXAM: CHEST - 2 VIEW COMPARISON:  CT 01/24/2019, radiograph 01/24/2019 FINDINGS: Mixed hazy interstitial and airspace opacities in the lungs with some fissural thickening. There is indistinctness of pulmonary vascularity and enlargement of the cardiac silhouette when compared to prior exams. No pneumothorax or visible effusion is seen. No acute osseous or soft tissue abnormality. Degenerative changes are present in the imaged spine and shoulders. IMPRESSION: Appearance favors congestive heart failure with increased size of the cardiac silhouette likely reflecting  cardiomegaly and features of interstitial and alveolar edema. Electronically Signed   By: Lovena Le M.D.   On: 12/21/2019 15:13    Procedures Procedures (including critical care time)  Medications Ordered in ED Medications  sodium chloride flush (NS) 0.9 % injection 3 mL (has no administration in time range)  sodium chloride flush (NS) 0.9 % injection 3 mL (3 mLs Intravenous Given 12/22/19 0124)  sodium chloride flush (NS) 0.9 % injection 3 mL (has no administration in time range)  0.9 %  sodium chloride infusion (has no administration in time range)  acetaminophen (TYLENOL) tablet 650 mg (has no administration in time range)  atorvastatin (LIPITOR) tablet 20 mg (20 mg Oral Given 12/21/19 2231)  metoprolol succinate (TOPROL-XL) 24 hr tablet 50 mg (50 mg Oral Given 12/21/19 2231)  apixaban (ELIQUIS) tablet 5 mg (has no administration in time range)  furosemide (LASIX) injection 20 mg (has no administration in time range)  insulin aspart (novoLOG) injection 0-15 Units (has no administration in time range)  insulin aspart (novoLOG) injection 0-5 Units (2 Units Subcutaneous Given 12/22/19 0123)  furosemide (LASIX) injection 20 mg (20 mg Intravenous Given 12/21/19 1551)    ED Course  I have reviewed the triage vital signs and the nursing notes.  Pertinent labs & imaging results that were available during my care of the patient were reviewed by me and considered in my medical decision making (see chart for details).    MDM Rules/Calculators/A&P                     Patient here for evaluation of shortness of breath, lower extremity edema. She has a new oxygen requirement in the emergency department but no respiratory distress. Lungs are clear on examination but chest x-ray demonstrates pulmonary edema. She was treated with Lasix with good diuresis but persistent hypoxemia. Cardiology consulted for admission for further treatment of CHF exacerbation with hypoxic respiratory failure. Patient  updated findings of studies and recommendation for admission and she is in agreement with treatment plan.   Damyia A Arriaga was evaluated in Emergency  Department on 12/22/2019 for the symptoms described in the history of present illness. She was evaluated in the context of the global COVID-19 pandemic, which necessitated consideration that the patient might be at risk for infection with the SARS-CoV-2 virus that causes COVID-19. Institutional protocols and algorithms that pertain to the evaluation of patients at risk for COVID-19 are in a state of rapid change based on information released by regulatory bodies including the CDC and federal and state organizations. These policies and algorithms were followed during the patient's care in the ED.  Final Clinical Impression(s) / ED Diagnoses Final diagnoses:  Acute congestive heart failure, unspecified heart failure type [facility])  Hypoxia    Rx / DC Orders ED Discharge Orders    None       Quintella Reichert, MD 12/22/19 [phone]

## 2019-12-21 NOTE — ED Triage Notes (Signed)
Pt reports intermittent pain across chest and SOB x 1 week. History of afib.

## 2019-12-21 NOTE — ED Notes (Signed)
Pt placed on 2L O2 [place]. O2 sat is now at 94%

## 2019-12-21 NOTE — Consult Note (Signed)
 CONSULTATION NOTE   Patient Name: Haley [name] Date of Encounter: 12/21/2019 Cardiologist: Thomas Kelly, MD  Chief Complaint   Shortness of breath  Patient Profile   53 year-old female with a history of PAF, type 2 diabetes, morbid obesity, hypertension, hypothyroidism and obstructive sleep apnea followed by Dr. Kelly.  He presents with 2 weeks of acute worsening shortness of breath, central chest tightness and ankle swelling.  Cardiology is asked to consult for heart failure.  HPI   Haley [name] is a 53 y.o. female who is being seen today for the evaluation of shortness of breath at the request of Dr. Rees.  This is a pleasant 53-year-old female with a history of PAF, type 2 diabetes, morbid obesity, hypertension, hypothyroidism and obstructive sleep apnea.  She is followed by Dr. Kelly who saw her last in August 2020.  She has had recurrent PAF and was last cardioverted in September 2017.  She is also had A. fib in the setting of hyperthyroidism.  Noted on Eliquis.  She has severe sleep apnea with an AHI of over 100/h and has been prescribed BiPAP but never started due to lack of insurance.  She now presents with 2 weeks of worsening shortness of breath, central chest pressure and ankle edema concerning for possible heart failure.  She is noted to have a low-grade T-max of 99.2.  Labs otherwise unremarkable with normal creatinine.  High-sensitivity troponin of 10.  Suggestive of CHF with cardiomegaly and some mild pulmonary edema.  Echo in January 2020 which showed an LVEF of 60 to 65% with normal diastolic filling.  Atrial sizes were normal bilaterally.  PMHx   Past Medical History:  Diagnosis Date  . Essential hypertension   . Fibroids   . Hyperthyroidism    a. 2010, treated w/ tapazole.  . Morbid obesity (HCC)   . Paroxysmal atrial fibrillation (HCC)    a. 03/2009 - in setting of hyperthyroidism and caffeine intake, converted spontaneously;  b. 08/2016 ED visit for  recurrent PAF-->successful DCCV in ED.  . Snores   . Type II diabetes mellitus (HCC)     Past Surgical History:  Procedure Laterality Date  . UTERINE FIBROID SURGERY      FAMHx   Family History  Problem Relation Age of Onset  . Hypertension Mother   . Diabetes Mellitus II Sister   . Cancer Maternal Grandmother     SOCHx    reports that she quit smoking about 9 years ago. She has a 32.00 pack-year smoking history. She has never used smokeless tobacco. She reports that she does not drink alcohol or use drugs.  Outpatient Medications   No current facility-administered medications on file prior to encounter.   Current Outpatient Medications on File Prior to Encounter  Medication Sig Dispense Refill  . acetaminophen (TYLENOL 8 HOUR ARTHRITIS PAIN) 650 MG CR tablet Take 650 mg by mouth every 8 (eight) hours as needed for pain.    . atorvastatin (LIPITOR) 20 MG tablet Take 1 tablet (20 mg total) by mouth daily. 90 tablet 3  . Blood Glucose Monitoring Suppl (TRUE METRIX METER) w/Device KIT Use twice daily 1 kit 0  . diclofenac sodium (VOLTAREN) 1 % GEL Apply 4 g topically 4 (four) times daily as needed. 100 g 3  . ELIQUIS 5 MG TABS tablet TAKE 1 TABLET (5 MG TOTAL) BY MOUTH 2 (TWO) TIMES DAILY. (Patient taking differently: Take 5 mg by mouth 2 (two) times daily. ) 60 tablet 3  .   glipiZIDE (GLUCOTROL) 10 MG tablet TAKE 1 TABLET (10 MG TOTAL) BY MOUTH 2 (TWO) TIMES DAILY BEFORE A MEAL. (Patient taking differently: Take 10 mg by mouth daily. ) 60 tablet 2  . glucose blood (TRUE METRIX BLOOD GLUCOSE TEST) test strip Use as instructed 100 each 12  . metFORMIN (GLUCOPHAGE-XR) 500 MG 24 hr tablet Take 4 tablets (2,000 mg total) by mouth daily. (Patient taking differently: Take 2,000 mg by mouth at bedtime. ) 120 tablet 3  . Methylsulfonylmethane (MSM PO) Take 2 tablets by mouth daily.    . metoprolol succinate (TOPROL-XL) 50 MG 24 hr tablet Take 50 mg (1 tablet) in the mornings and 25 mg (half  tablet) in the evenings (Patient taking differently: Take 50 mg by mouth daily. ) 45 tablet 3  . Multiple Vitamins-Minerals (ALIVE WOMENS 50+) TABS Take 1 tablet by mouth daily.    . TRUEplus Lancets 28G MISC Use twice daily for blood glucose check 100 each 1  . TRULICITY 1.5 MG/0.5ML SOPN INJECT 1.5 MG INTO THE SKIN ONCE A WEEK. (Patient taking differently: Inject 1.5 mg into the skin once a week. Mondays) 2 mL 2  . TURMERIC PO Take 1 tablet by mouth daily.      Inpatient Medications    Scheduled Meds: . sodium chloride flush  3 mL Intravenous Once    Continuous Infusions:   PRN Meds:    ALLERGIES   No Known Allergies  ROS   Pertinent items noted in HPI and remainder of comprehensive ROS otherwise negative.  Vitals   Vitals:   12/21/19 1429 12/21/19 1515 12/21/19 1530 12/21/19 1639  BP: (!) 121/56 (!) 132/56 (!) 125/55 (!) 121/56  Pulse: 80 74 67 64  Resp: 17 (!) 22 20 19  Temp: 99.2 F (37.3 C)     TempSrc: Oral     SpO2: (!) 86% 92% 98% 97%   No intake or output data in the 24 hours ending 12/21/19 1705 There were no vitals filed for this visit.  Physical Exam   General appearance: alert, no distress and morbidly obese Neck: no carotid bruit, no JVD and thyroid not enlarged, symmetric, no tenderness/mass/nodules Lungs: clear to auscultation bilaterally Heart: regular rate and rhythm, S1, S2 normal, no murmur, click, rub or gallop Abdomen: soft, non-tender; bowel sounds normal; no masses,  no organomegaly Extremities: extremities normal, atraumatic, no cyanosis or edema Pulses: 2+ and symmetric Skin: Skin color, texture, turgor normal. No rashes or lesions Neurologic: Grossly normal Psych: Pleasant  Labs   Results for orders placed or performed during the hospital encounter of 12/21/19 (from the past 48 hour(s))  Basic metabolic panel     Status: Abnormal   Collection Time: 12/21/19  2:27 PM  Result Value Ref Range   Sodium 141 135 - 145 mmol/L    Potassium 4.0 3.5 - 5.1 mmol/L   Chloride 102 98 - 111 mmol/L   CO2 30 22 - 32 mmol/L   Glucose, Bld 237 (H) 70 - 99 mg/dL   BUN 11 6 - 20 mg/dL   Creatinine, Ser 0.52 0.44 - 1.00 mg/dL   Calcium 9.0 8.9 - 10.3 mg/dL   GFR calc non Af Amer >60 >60 mL/min   GFR calc Af Amer >60 >60 mL/min   Anion gap 9 5 - 15    Comment: Performed at [facility], [address] [place], [place] [address]  CBC     Status: None   Collection Time: 12/21/19  2:27 PM    Result Value Ref Range   WBC 9.9 4.0 - 10.5 K/uL   RBC 4.77 3.87 - 5.11 MIL/uL   Hemoglobin 13.7 12.0 - 15.0 g/dL   HCT 45.6 36.0 - 46.0 %   MCV 95.6 80.0 - 100.0 fL   MCH 28.7 26.0 - 34.0 pg   MCHC 30.0 30.0 - 36.0 g/dL   RDW 13.7 11.5 - 15.5 %   Platelets 246 150 - 400 K/uL   nRBC 0.0 0.0 - 0.2 %    Comment: Performed at [facility] [address], [place], [place] [address]  Troponin I (High Sensitivity)     Status: None   Collection Time: 12/21/19  2:27 PM  Result Value Ref Range   Troponin I (High Sensitivity) 10 <18 ng/L    Comment: (NOTE) Elevated high sensitivity troponin I (hsTnI) values and significant  changes across serial measurements may suggest ACS but many other  chronic and acute conditions are known to elevate hsTnI results.  Refer to the "Links" section for chest pain algorithms and additional  guidance. Performed at [facility], [place] [address], [place], [place] [address]   Brain natriuretic peptide     Status: Abnormal   Collection Time: 12/21/19  2:27 PM  Result Value Ref Range   B Natriuretic Peptide 205.8 (H) 0.0 - 100.0 pg/mL    Comment: Performed at [facility] [address] [place], [place] [address]  I-Stat beta hCG blood, ED     Status: None   Collection Time: 12/21/19  2:31 PM  Result Value Ref Range   I-stat hCG, quantitative <5.0 <5 mIU/mL   Comment 3            Comment:   GEST. AGE      CONC.  (mIU/mL)   <=1 WEEK        5 - 50     2 WEEKS       50 - 500     3  WEEKS       100 - 10,000     4 WEEKS     1,000 - 30,000        FEMALE AND NON-PREGNANT FEMALE:     LESS THAN 5 mIU/mL   Troponin I (High Sensitivity)     Status: None   Collection Time: 12/21/19  3:55 PM  Result Value Ref Range   Troponin I (High Sensitivity) 7 <18 ng/L    Comment: (NOTE) Elevated high sensitivity troponin I (hsTnI) values and significant  changes across serial measurements may suggest ACS but many other  chronic and acute conditions are known to elevate hsTnI results.  Refer to the "Links" section for chest pain algorithms and additional  guidance. Performed at [facility], [place] [address], [place], [place] [address]     ECG   Normal sinus rhythm at 82, rightward axis- Personally Reviewed  Telemetry   Sinus rhythm- Personally Reviewed  Radiology   DG Chest 2 View  Result Date: 12/21/2019 CLINICAL DATA:  Chest pain, shortness of breath for 1 week, history of AFib EXAM: CHEST - 2 VIEW COMPARISON:  CT 01/24/2019, radiograph 01/24/2019 FINDINGS: Mixed hazy interstitial and airspace opacities in the lungs with some fissural thickening. There is indistinctness of pulmonary vascularity and enlargement of the cardiac silhouette when compared to prior exams. No pneumothorax or visible effusion is seen. No acute osseous or soft tissue abnormality. Degenerative changes are present in the imaged spine and shoulders.  IMPRESSION: Appearance favors congestive heart failure with increased size of the cardiac silhouette likely reflecting cardiomegaly and features of interstitial and alveolar edema. Electronically Signed   By: Price  DeHay M.D.   On: 12/21/2019 15:13    Cardiac Studies   N/A  Impression   Principal Problem:   Acute diastolic (congestive) heart failure (HCC)   Recommendation   1. Ms. Tarango presents with several weeks of worsening shortness of breath and was noted to have an elevated BNP, mild interstitial congestion and lower extremity edema as  well as some orthopnea, consistent with acute diastolic congestive heart failure.  BNP notably was much lower in January at the time when she had normal systolic and diastolic function on her echo.  She was given 20 mg IV Lasix by the emergency department and is breathing better.  Delta troponins are negative at 10 and 7.  I feel she is safe from a cardiac standpoint for discharge from the emergency room.  I would advise continuing p.o. Lasix 20 mg daily.  She has follow-up already scheduled [facility]  We will arrange for follow-up at heart care next week.  Thank for the consultation.  Time Spent Directly with Patient:  I have spent a total of 45 minutes with the patient reviewing hospital notes, telemetry, EKGs, labs and examining the patient as well as establishing an assessment and plan that was discussed personally with the patient.  > 50% of time was spent in direct patient care.  Length of Stay:  LOS: 0 days   Kenneth C. Hilty, MD, FACC, FACP  [place]  [facility]  Medical Director of the [facility] Diplomate of the American Board of Clinical Lipidology Attending Cardiologist  Direct Dial: [phone]  Fax: [phone]  Website:  [email or web address]   Kenneth C Hilty 12/21/2019, 5:05 PM    

## 2019-12-22 ENCOUNTER — Inpatient Hospital Stay (HOSPITAL_COMMUNITY): Payer: Self-pay

## 2019-12-22 DIAGNOSIS — R0902 Hypoxemia: Secondary | ICD-10-CM

## 2019-12-22 DIAGNOSIS — R0602 Shortness of breath: Secondary | ICD-10-CM

## 2019-12-22 DIAGNOSIS — J9601 Acute respiratory failure with hypoxia: Secondary | ICD-10-CM

## 2019-12-22 DIAGNOSIS — R609 Edema, unspecified: Secondary | ICD-10-CM

## 2019-12-22 DIAGNOSIS — E0590 Thyrotoxicosis, unspecified without thyrotoxic crisis or storm: Secondary | ICD-10-CM

## 2019-12-22 LAB — BASIC METABOLIC PANEL
Anion gap: 10 (ref 5–15)
BUN: 10 mg/dL (ref 6–20)
CO2: 32 mmol/L (ref 22–32)
Calcium: 8.9 mg/dL (ref 8.9–10.3)
Chloride: 98 mmol/L (ref 98–111)
Creatinine, Ser: 0.51 mg/dL (ref 0.44–1.00)
GFR calc Af Amer: >60 mL/min (ref >60)
GFR calc non Af Amer: >60 mL/min (ref >60)
Glucose, Bld: 264 mg/dL — ABNORMAL HIGH (ref 70–99)
Potassium: 3.7 mmol/L (ref 3.5–5.1)
Sodium: 140 mmol/L (ref 135–145)

## 2019-12-22 LAB — D-DIMER, QUANTITATIVE: D-Dimer, Quant: 0.6 ug/mL-FEU — ABNORMAL HIGH (ref 0.00–0.50)

## 2019-12-22 LAB — HEMOGLOBIN A1C
Hgb A1c MFr Bld: 7.8 % — ABNORMAL HIGH (ref 4.8–5.6)
Mean Plasma Glucose: 177.16 mg/dL

## 2019-12-22 LAB — MAGNESIUM: Magnesium: 1.5 mg/dL — ABNORMAL LOW (ref 1.7–2.4)

## 2019-12-22 LAB — ECHOCARDIOGRAM COMPLETE BUBBLE STUDY

## 2019-12-22 LAB — GLUCOSE, CAPILLARY
Glucose-Capillary: 176 mg/dL — ABNORMAL HIGH (ref 70–99)
Glucose-Capillary: 207 mg/dL — ABNORMAL HIGH (ref 70–99)
Glucose-Capillary: 119 mg/dL — ABNORMAL HIGH (ref 70–99)
Glucose-Capillary: 276 mg/dL — ABNORMAL HIGH (ref 70–99)

## 2019-12-22 LAB — CBG MONITORING, ED: Glucose-Capillary: 238 mg/dL — ABNORMAL HIGH (ref 70–99)

## 2019-12-22 LAB — T4, FREE: Free T4: 1.33 ng/dL — ABNORMAL HIGH (ref 0.61–1.12)

## 2019-12-22 MED ORDER — FUROSEMIDE 10 MG/ML IJ SOLN
20.0000 mg | Freq: Once | INTRAMUSCULAR | Status: AC
  Administered 2019-12-22: 20 mg via INTRAVENOUS
  Filled 2019-12-22: qty 2

## 2019-12-22 MED ORDER — IOHEXOL 350 MG/ML SOLN
100.0000 mL | Freq: Once | INTRAVENOUS | Status: AC | PRN
  Administered 2019-12-22: 100 mL via INTRAVENOUS

## 2019-12-22 MED ORDER — INSULIN ASPART 100 UNIT/ML ~~LOC~~ SOLN
0.0000 [IU] | Freq: Three times a day (TID) | SUBCUTANEOUS | Status: DC
  Administered 2019-12-22: 3 [IU] via SUBCUTANEOUS
  Administered 2019-12-22: 5 [IU] via SUBCUTANEOUS
  Administered 2019-12-23: 3 [IU] via SUBCUTANEOUS
  Administered 2019-12-23 (×2): 5 [IU] via SUBCUTANEOUS
  Administered 2019-12-24: 11 [IU] via SUBCUTANEOUS
  Administered 2019-12-24: 2 [IU] via SUBCUTANEOUS

## 2019-12-22 MED ORDER — NITROGLYCERIN 0.4 MG SL SUBL: 0.4000 mg | SUBLINGUAL_TABLET | SUBLINGUAL | Status: DC | PRN

## 2019-12-22 MED ORDER — INSULIN ASPART 100 UNIT/ML ~~LOC~~ SOLN
0.0000 [IU] | Freq: Every day | SUBCUTANEOUS | Status: DC
  Administered 2019-12-22: 3 [IU] via SUBCUTANEOUS
  Administered 2019-12-22 – 2019-12-23 (×2): 2 [IU] via SUBCUTANEOUS

## 2019-12-22 MED ORDER — METHIMAZOLE 5 MG PO TABS
5.0000 mg | ORAL_TABLET | Freq: Three times a day (TID) | ORAL | Status: DC
  Administered 2019-12-22 – 2019-12-24 (×7): 5 mg via ORAL
  Filled 2019-12-22 (×9): qty 1

## 2019-12-22 NOTE — ED Notes (Signed)
ED TO INPATIENT HANDOFF REPORT  ED Nurse Name and Phone #: Beatrice Lecher [phone]  S Name/Age/Gender Haley [name] 54 y.o. female Room/Bed: 025C/025C  Code Status   Code Status: Full Code  Home/SNF/Other Home Patient oriented to: self Is this baseline? Yes   Triage Complete: Triage complete  Chief Complaint Hypoxia [R09.02]  Triage Note Pt reports intermittent pain across chest and SOB x 1 week. History of afib.    Allergies No Known Allergies  Level of Care/Admitting Diagnosis ED Disposition    ED Disposition Condition [facility] Area: [place] [[id number]]  Level of Care: Progressive [102]  Admit to Progressive based on following criteria: CARDIOVASCULAR & THORACIC of moderate stability with acute coronary syndrome symptoms/low risk myocardial infarction/hypertensive urgency/arrhythmias/heart failure potentially compromising stability and stable post cardiovascular intervention patients.  Covid Evaluation: Confirmed COVID Negative  Date Laboratory Confirmed COVID Negative: 12/21/2019  Diagnosis: Hypoxia [id number]  Admitting Physician: Milus Banister [id number]  Attending Physician: Sanda Klein [id number]  Estimated length of stay: past midnight tomorrow  Certification:: I certify this patient will need inpatient services for at least 2 midnights       B Medical/Surgery History Past Medical History:  Diagnosis Date  . Essential hypertension   . Fibroids   . Hyperthyroidism    a. 2010, treated w/ tapazole.  . Morbid obesity ([place])   . Paroxysmal atrial fibrillation ([place])    a. 03/2009 - in setting of hyperthyroidism and caffeine intake, converted spontaneously;  b. 08/2016 ED visit for recurrent PAF-->successful DCCV in ED.  [name] Snores   . Type II diabetes mellitus ([place])    Past Surgical History:  Procedure Laterality Date  . UTERINE FIBROID SURGERY       A IV Location/Drains/Wounds Patient Lines/Drains/Airways Status   Active  Line/Drains/Airways    Name:   Placement date:   Placement time:   Site:   Days:   Peripheral IV 12/21/19 Right Forearm   12/21/19    1548    Forearm   1          Intake/Output Last 24 hours  Intake/Output Summary (Last 24 hours) at 12/22/2019 0257 Last data filed at 12/22/2019 0124 Gross per 24 hour  Intake 3 ml  Output -  Net 3 ml    Labs/Imaging Results for orders placed or performed during the hospital encounter of 12/21/19 (from the past 48 hour(s))  Basic metabolic panel     Status: Abnormal   Collection Time: 12/21/19  2:27 PM  Result Value Ref Range   Sodium 141 135 - 145 mmol/L   Potassium 4.0 3.5 - 5.1 mmol/L   Chloride 102 98 - 111 mmol/L   CO2 30 22 - 32 mmol/L   Glucose, Bld 237 (H) 70 - 99 mg/dL   BUN 11 6 - 20 mg/dL   Creatinine, Ser 0.52 0.44 - 1.00 mg/dL   Calcium 9.0 8.9 - 10.3 mg/dL   GFR calc non Af Amer >60 >60 mL/min   GFR calc Af Amer >60 >60 mL/min   Anion gap 9 5 - 15    Comment: Performed at [facility], [place] [address] [place] [address]  CBC     Status: None   Collection Time: 12/21/19  2:27 PM  Result Value Ref Range   WBC 9.9 4.0 - 10.5 K/uL   RBC 4.77 3.87 - 5.11 MIL/uL   Hemoglobin 13.7 12.0 - 15.0 g/dL   HCT 45.6 36.0 -  46.0 %   MCV 95.6 80.0 - 100.0 fL   MCH 28.7 26.0 - 34.0 pg   MCHC 30.0 30.0 - 36.0 g/dL   RDW 13.7 11.5 - 15.5 %   Platelets 246 150 - 400 K/uL   nRBC 0.0 0.0 - 0.2 %    Comment: Performed at [facility] [address] [place], [place] [address]  Troponin I (High Sensitivity)     Status: None   Collection Time: 12/21/19  2:27 PM  Result Value Ref Range   Troponin I (High Sensitivity) 10 <18 ng/L    Comment: (NOTE) Elevated high sensitivity troponin I (hsTnI) values and significant  changes across serial measurements may suggest ACS but many other  chronic and acute conditions are known to elevate hsTnI results.  Refer to the "Links" section for chest pain algorithms and additional   guidance. Performed at [facility], [place] [address] [place], [place] [address]   Brain natriuretic peptide     Status: Abnormal   Collection Time: 12/21/19  2:27 PM  Result Value Ref Range   B Natriuretic Peptide 205.8 (H) 0.0 - 100.0 pg/mL    Comment: Performed at [facility] [address] [place], [place] [address]  I-Stat beta hCG blood, ED     Status: None   Collection Time: 12/21/19  2:31 PM  Result Value Ref Range   I-stat hCG, quantitative <5.0 <5 mIU/mL   Comment 3            Comment:   GEST. AGE      CONC.  (mIU/mL)   <=1 WEEK        5 - 50     2 WEEKS       50 - 500     3 WEEKS       100 - 10,000     4 WEEKS     1,000 - 30,000        FEMALE AND NON-PREGNANT FEMALE:     LESS THAN 5 mIU/mL   SARS CORONAVIRUS 2 (TAT 6-24 HRS) Nasopharyngeal Nasopharyngeal Swab     Status: None   Collection Time: 12/21/19  3:55 PM   Specimen: Nasopharyngeal Swab  Result Value Ref Range   SARS Coronavirus 2 NEGATIVE NEGATIVE    Comment: (NOTE) SARS-CoV-2 target nucleic acids are NOT DETECTED. The SARS-CoV-2 RNA is generally detectable in upper and lower respiratory specimens during the acute phase of infection. Negative results do not preclude SARS-CoV-2 infection, do not rule out co-infections with other pathogens, and should not be used as the sole basis for treatment or other patient management decisions. Negative results must be combined with clinical observations, patient history, and epidemiological information. The expected result is Negative. Fact Sheet for Patients: SugarRoll.be Fact Sheet for Healthcare Providers: https://www.woods-mathews.com/ This test is not yet approved or cleared by the [place] FDA and  has been authorized for detection and/or diagnosis of SARS-CoV-2 by FDA under an Emergency Use Authorization (EUA). This EUA will remain  in effect (meaning this test can be used) for the duration of  the COVID-19 declaration under Section 56 4(b)(1) of the Act, 21 U.S.C. section 360bbb-3(b)(1), unless the authorization is terminated or revoked sooner. Performed at [facility], [place] [address], [place], [place] [address]   Troponin I (High Sensitivity)     Status: None   Collection Time: 12/21/19  3:55 PM  Result Value Ref Range   Troponin I (High  Sensitivity) 7 <18 ng/L    Comment: (NOTE) Elevated high sensitivity troponin I (hsTnI) values and significant  changes across serial measurements may suggest ACS but many other  chronic and acute conditions are known to elevate hsTnI results.  Refer to the "Links" section for chest pain algorithms and additional  guidance. Performed at [facility], [place] [address], [place], [place] [address]   TSH     Status: Abnormal   Collection Time: 12/21/19  4:40 PM  Result Value Ref Range   TSH 0.010 (L) 0.350 - 4.500 uIU/mL    Comment: Performed by a 3rd Generation assay with a functional sensitivity of <=0.01 uIU/mL. Performed at [facility], [place] [address] [place], [place] [address]   CBG monitoring, ED     Status: Abnormal   Collection Time: 12/22/19  1:06 AM  Result Value Ref Range   Glucose-Capillary 238 (H) 70 - 99 mg/dL  Basic metabolic panel     Status: Abnormal   Collection Time: 12/22/19  1:25 AM  Result Value Ref Range   Sodium 140 135 - 145 mmol/L   Potassium 3.7 3.5 - 5.1 mmol/L   Chloride 98 98 - 111 mmol/L   CO2 32 22 - 32 mmol/L   Glucose, Bld 264 (H) 70 - 99 mg/dL   BUN 10 6 - 20 mg/dL   Creatinine, Ser 0.51 0.44 - 1.00 mg/dL   Calcium 8.9 8.9 - 10.3 mg/dL   GFR calc non Af Amer >60 >60 mL/min   GFR calc Af Amer >60 >60 mL/min   Anion gap 10 5 - 15    Comment: Performed at [facility], [place] [address], [place], [place] [address]  Magnesium     Status: Abnormal   Collection Time: 12/22/19  1:25 AM  Result Value Ref Range   Magnesium 1.5 (L) 1.7 - 2.4 mg/dL    Comment: Performed at [facility] [address] [place], [place] [address]  Hemoglobin A1c     Status: Abnormal   Collection Time: 12/22/19  1:25 AM  Result Value Ref Range   Hgb A1c MFr Bld 7.8 (H) 4.8 - 5.6 %    Comment: (NOTE) Pre diabetes:          5.7%-6.4% Diabetes:              >6.4% Glycemic control for   <7.0% adults with diabetes    Mean Plasma Glucose 177.16 mg/dL    Comment: Performed at [facility] [address], [place], [place] [address]   DG Chest 2 View  Result Date: 12/21/2019 CLINICAL DATA:  Chest pain, shortness of breath for 1 week, history of AFib EXAM: CHEST - 2 VIEW COMPARISON:  CT 01/24/2019, radiograph 01/24/2019 FINDINGS: Mixed hazy interstitial and airspace opacities in the lungs with some fissural thickening. There is indistinctness of pulmonary vascularity and enlargement of the cardiac silhouette when compared to prior exams. No pneumothorax or visible effusion is seen. No acute osseous or soft tissue abnormality. Degenerative changes are present in the imaged spine and shoulders. IMPRESSION: Appearance favors congestive heart failure with increased size of the cardiac silhouette likely reflecting cardiomegaly and features of interstitial and alveolar edema. Electronically Signed   By: Lovena Le M.D.   On: 12/21/2019 15:13    Pending Labs Unresulted Labs (From admission, onward)    Start     Ordered   12/22/19 [id number]  Basic metabolic panel  Daily,  R     12/21/19 2152   12/22/19 0500  Magnesium  Daily,   R     12/22/19 0018   12/22/19 0218  D-dimer, quantitative (not at [facility])  Add-on,   AD     12/22/19 0217   12/22/19 0026  T4, free  Add-on,   AD     12/22/19 0025          Vitals/Pain Today's Vitals   12/22/19 0100 12/22/19 0105 12/22/19 0136 12/22/19 0200  BP: (!) 148/77   (!) 144/113  Pulse: 82   80  Resp: 19   18  Temp:      TempSrc:      SpO2: (!) 88% 94%  95%  PainSc:   3      Isolation Precautions No active  isolations  Medications Medications  sodium chloride flush (NS) 0.9 % injection 3 mL (has no administration in time range)  sodium chloride flush (NS) 0.9 % injection 3 mL (3 mLs Intravenous Given 12/22/19 0124)  sodium chloride flush (NS) 0.9 % injection 3 mL (has no administration in time range)  0.9 %  sodium chloride infusion (has no administration in time range)  acetaminophen (TYLENOL) tablet 650 mg (has no administration in time range)  atorvastatin (LIPITOR) tablet 20 mg (20 mg Oral Given 12/21/19 2231)  metoprolol succinate (TOPROL-XL) 24 hr tablet 50 mg (50 mg Oral Given 12/21/19 2231)  apixaban (ELIQUIS) tablet 5 mg (has no administration in time range)  furosemide (LASIX) injection 20 mg (has no administration in time range)  insulin aspart (novoLOG) injection 0-15 Units (has no administration in time range)  insulin aspart (novoLOG) injection 0-5 Units (2 Units Subcutaneous Given 12/22/19 0123)  furosemide (LASIX) injection 20 mg (20 mg Intravenous Given 12/21/19 1551)    Mobility walks Low fall risk   Focused Assessments Cardiac Assessment Handoff:  Cardiac Rhythm: Normal sinus rhythm Lab Results  Component Value Date   CKTOTAL 87 03/15/2009   CKMB 1.2 03/15/2009   TROPONINI <0.03 01/25/2019   Lab Results  Component Value Date   DDIMER 0.34 01/01/2019   Does the Patient currently have chest pain? No     R Recommendations: See Admitting Provider Note  Report given to:   Additional Notes:

## 2019-12-22 NOTE — Progress Notes (Signed)
DAILY PROGRESS NOTE   Patient Name: Haley [name] Date of Encounter: 12/22/2019 Cardiologist: [name], MD  Chief Complaint   Breathing is better  Patient Profile   54 year-old female with a history of PAF, type 2 diabetes, morbid obesity, hypertension, hypothyroidism and obstructive sleep apnea followed by Dr. Claiborne Billings.  He presents with 2 weeks of acute worsening shortness of breath, central chest tightness and ankle swelling.  Cardiology is asked to consult for heart failure. Although she initially improved with diuresis, she remained hypoxic and was admitted. Concern for possible PE.  Subjective   No recorded diuresis, but breathing better today. Able lie flat. Started on CPAP last night. TSH is suppressed and free T4 is elevated - she thought it may be the case. She has not been on treatment for this in years. Could have lead to cardiomyopathy. Echo with bubble pending today. Will order CT pulmonary angio since her D-dimer was elevated and she has been inconsistent with Eliquis. Venous dopplers are pending.  Objective   Vitals:   12/22/19 0300 12/22/19 0355 12/22/19 0426 12/22/19 0728  BP: 120/79 (!) 122/55    Pulse: 65 64 63 72  Resp: 18 20 16 18   Temp:  98.8 F (37.1 C)    TempSrc:  Oral    SpO2: 93% 92% 95% (!) 89%    Intake/Output Summary (Last 24 hours) at 12/22/2019 0951 Last data filed at 12/22/2019 0124 Gross per 24 hour  Intake 3 ml  Output --  Net 3 ml   There were no vitals filed for this visit.  Physical Exam   General appearance: alert, no distress and morbidly obese Neck: no carotid bruit, no JVD and thyroid not enlarged, symmetric, no tenderness/mass/nodules Lungs: clear to auscultation bilaterally Heart: regular rate and rhythm, S1, S2 normal, no murmur, click, rub or gallop Abdomen: soft, non-tender; bowel sounds normal; no masses,  no organomegaly and morbidly obese Extremities: extremities normal, atraumatic, no cyanosis or edema Pulses:  2+ and symmetric Skin: Skin color, texture, turgor normal. No rashes or lesions Neurologic: Grossly normal Psych: Pleasant  Inpatient Medications    Scheduled Meds: . apixaban  5 mg Oral BID  . atorvastatin  20 mg Oral Daily  . insulin aspart  0-15 Units Subcutaneous TID WC  . insulin aspart  0-5 Units Subcutaneous QHS  . metoprolol succinate  50 mg Oral QHS  . sodium chloride flush  3 mL Intravenous Once  . sodium chloride flush  3 mL Intravenous Q12H    Continuous Infusions: . sodium chloride      PRN Meds: sodium chloride, acetaminophen, sodium chloride flush   Labs   Results for orders placed or performed during the hospital encounter of 12/21/19 (from the past 48 hour(s))  Basic metabolic panel     Status: Abnormal   Collection Time: 12/21/19  2:27 PM  Result Value Ref Range   Sodium 141 135 - 145 mmol/L   Potassium 4.0 3.5 - 5.1 mmol/L   Chloride 102 98 - 111 mmol/L   CO2 30 22 - 32 mmol/L   Glucose, Bld 237 (H) 70 - 99 mg/dL   BUN 11 6 - 20 mg/dL   Creatinine, Ser 0.52 0.44 - 1.00 mg/dL   Calcium 9.0 8.9 - 10.3 mg/dL   GFR calc non Af Amer >60 >60 mL/min   GFR calc Af Amer >60 >60 mL/min   Anion gap 9 5 - 15    Comment: Performed at [facility],  [address], [place] [address]  CBC     Status: None   Collection Time: 12/21/19  2:27 PM  Result Value Ref Range   WBC 9.9 4.0 - 10.5 K/uL   RBC 4.77 3.87 - 5.11 MIL/uL   Hemoglobin 13.7 12.0 - 15.0 g/dL   HCT 45.6 36.0 - 46.0 %   MCV 95.6 80.0 - 100.0 fL   MCH 28.7 26.0 - 34.0 pg   MCHC 30.0 30.0 - 36.0 g/dL   RDW 13.7 11.5 - 15.5 %   Platelets 246 150 - 400 K/uL   nRBC 0.0 0.0 - 0.2 %    Comment: Performed at [facility], [place] [address], [place], [place] [address]  Troponin I (High Sensitivity)     Status: None   Collection Time: 12/21/19  2:27 PM  Result Value Ref Range   Troponin I (High Sensitivity) 10 <18 ng/L    Comment: (NOTE) Elevated high sensitivity troponin I (hsTnI)  values and significant  changes across serial measurements may suggest ACS but many other  chronic and acute conditions are known to elevate hsTnI results.  Refer to the "Links" section for chest pain algorithms and additional  guidance. Performed at [facility], [place] [address], [place], [place] [address]   Brain natriuretic peptide     Status: Abnormal   Collection Time: 12/21/19  2:27 PM  Result Value Ref Range   B Natriuretic Peptide 205.8 (H) 0.0 - 100.0 pg/mL    Comment: Performed at [facility] [address], [place], [place] [address]  I-Stat beta hCG blood, ED     Status: None   Collection Time: 12/21/19  2:31 PM  Result Value Ref Range   I-stat hCG, quantitative <5.0 <5 mIU/mL   Comment 3            Comment:   GEST. AGE      CONC.  (mIU/mL)   <=1 WEEK        5 - 50     2 WEEKS       50 - 500     3 WEEKS       100 - 10,000     4 WEEKS     1,000 - 30,000        FEMALE AND NON-PREGNANT FEMALE:     LESS THAN 5 mIU/mL   SARS CORONAVIRUS 2 (TAT 6-24 HRS) Nasopharyngeal Nasopharyngeal Swab     Status: None   Collection Time: 12/21/19  3:55 PM   Specimen: Nasopharyngeal Swab  Result Value Ref Range   SARS Coronavirus 2 NEGATIVE NEGATIVE    Comment: (NOTE) SARS-CoV-2 target nucleic acids are NOT DETECTED. The SARS-CoV-2 RNA is generally detectable in upper and lower respiratory specimens during the acute phase of infection. Negative results do not preclude SARS-CoV-2 infection, do not rule out co-infections with other pathogens, and should not be used as the sole basis for treatment or other patient management decisions. Negative results must be combined with clinical observations, patient history, and epidemiological information. The expected result is Negative. Fact Sheet for Patients: SugarRoll.be Fact Sheet for Healthcare Providers: https://www.woods-mathews.com/ This test is not yet approved or cleared by the  [place] FDA and  has been authorized for detection and/or diagnosis of SARS-CoV-2 by FDA under an Emergency Use Authorization (EUA). This EUA will remain  in effect (meaning this test can be used) for the duration of the COVID-19 declaration under Section 56 4(b)(1)  of the Act, 21 U.S.C. section 360bbb-3(b)(1), unless the authorization is terminated or revoked sooner. Performed at [facility], [place] [address], [place], [place] [address]   Troponin I (High Sensitivity)     Status: None   Collection Time: 12/21/19  3:55 PM  Result Value Ref Range   Troponin I (High Sensitivity) 7 <18 ng/L    Comment: (NOTE) Elevated high sensitivity troponin I (hsTnI) values and significant  changes across serial measurements may suggest ACS but many other  chronic and acute conditions are known to elevate hsTnI results.  Refer to the "Links" section for chest pain algorithms and additional  guidance. Performed at [facility], [place] [address], [place], [place] [address]   TSH     Status: Abnormal   Collection Time: 12/21/19  4:40 PM  Result Value Ref Range   TSH 0.010 (L) 0.350 - 4.500 uIU/mL    Comment: Performed by a 3rd Generation assay with a functional sensitivity of <=0.01 uIU/mL. Performed at [facility], [place] [address] [place], [place] [address]   CBG monitoring, ED     Status: Abnormal   Collection Time: 12/22/19  1:06 AM  Result Value Ref Range   Glucose-Capillary 238 (H) 70 - 99 mg/dL  Basic metabolic panel     Status: Abnormal   Collection Time: 12/22/19  1:25 AM  Result Value Ref Range   Sodium 140 135 - 145 mmol/L   Potassium 3.7 3.5 - 5.1 mmol/L   Chloride 98 98 - 111 mmol/L   CO2 32 22 - 32 mmol/L   Glucose, Bld 264 (H) 70 - 99 mg/dL   BUN 10 6 - 20 mg/dL   Creatinine, Ser 0.51 0.44 - 1.00 mg/dL   Calcium 8.9 8.9 - 10.3 mg/dL   GFR calc non Af Amer >60 >60 mL/min   GFR calc Af Amer >60 >60 mL/min   Anion gap 10 5 - 15    Comment: Performed at  [facility], [place] [address], [place], [place] [address]  Magnesium     Status: Abnormal   Collection Time: 12/22/19  1:25 AM  Result Value Ref Range   Magnesium 1.5 (L) 1.7 - 2.4 mg/dL    Comment: Performed at [facility] [address], [place], [place] [address]  Hemoglobin A1c     Status: Abnormal   Collection Time: 12/22/19  1:25 AM  Result Value Ref Range   Hgb A1c MFr Bld 7.8 (H) 4.8 - 5.6 %    Comment: (NOTE) Pre diabetes:          5.7%-6.4% Diabetes:              >6.4% Glycemic control for   <7.0% adults with diabetes    Mean Plasma Glucose 177.16 mg/dL    Comment: Performed at [facility] [address], [place],  [address]  T4, free     Status: Abnormal   Collection Time: 12/22/19  4:34 AM  Result Value Ref Range   Free T4 1.33 (H) 0.61 - 1.12 ng/dL    Comment: (NOTE) Biotin ingestion may interfere with free T4 tests. If the results are inconsistent with the TSH level, previous test results, or the clinical presentation, then consider biotin interference. If needed, order repeat testing after stopping biotin. Performed at [facility], [place] [address], [place],  [address]   D-dimer, quantitative (not at [facility])     Status: Abnormal  Collection Time: 12/22/19  4:34 AM  Result Value Ref Range   D-Dimer, Quant 0.60 (H) 0.00 - 0.50 ug/mL-FEU    Comment: (NOTE) At the manufacturer cut-off of 0.50 ug/mL FEU, this assay has been documented to exclude PE with a sensitivity and negative predictive value of 97 to 99%.  At this time, this assay has not been approved by the FDA to exclude DVT/VTE. Results should be correlated with clinical presentation. Performed at [facility], [place] [address] [place], [place] [address]   Glucose, capillary     Status: Abnormal   Collection Time: 12/22/19  8:05 AM  Result Value Ref Range   Glucose-Capillary 176 (H) 70 - 99 mg/dL    ECG   N/A  Telemetry   Sinus rhythm - Personally  Reviewed  Radiology    DG Chest 2 View  Result Date: 12/21/2019 CLINICAL DATA:  Chest pain, shortness of breath for 1 week, history of AFib EXAM: CHEST - 2 VIEW COMPARISON:  CT 01/24/2019, radiograph 01/24/2019 FINDINGS: Mixed hazy interstitial and airspace opacities in the lungs with some fissural thickening. There is indistinctness of pulmonary vascularity and enlargement of the cardiac silhouette when compared to prior exams. No pneumothorax or visible effusion is seen. No acute osseous or soft tissue abnormality. Degenerative changes are present in the imaged spine and shoulders. IMPRESSION: Appearance favors congestive heart failure with increased size of the cardiac silhouette likely reflecting cardiomegaly and features of interstitial and alveolar edema. Electronically Signed   By: Lovena Le M.D.   On: 12/21/2019 15:13    Cardiac Studies   N/A  Assessment   1. Principal Problem: 2.   Acute diastolic (congestive) heart failure ([place]) 3. Active Problems: 4.   Hypoxia 5.   Plan   1. Hypoxia and persistent dyspnea with clear lungs. Mildly elevated d-dimer with inconsistent Eliquis use - plan CT angio to r/o PE today and LE venous dopplers. Continue Eliquis. Breathing improved with lasix - this is more consistent with CHF. Appears to be hyperthyroid - this was an issue in the past, but she has not been on treatment recently. Start tapazole 5 mg TID. Awaiting echo with bubble study today.  Time Spent Directly with Patient:  I have spent a total of 35 minutes with the patient reviewing hospital notes, telemetry, EKGs, labs and examining the patient as well as establishing an assessment and plan that was discussed personally with the patient.  > 50% of time was spent in direct patient care.  Length of Stay:  LOS: 1 day   Pixie Casino, MD, [facility], [place] Director of the Advanced Lipid Disorders &  Cardiovascular Risk Reduction  Clinic Diplomate of the American Board of Clinical Lipidology Attending Cardiologist  Direct Dial: [phone]  Fax: [phone]  Website:  [email or web address] 12/22/2019, 9:51 AM

## 2019-12-22 NOTE — Progress Notes (Signed)
  Echocardiogram 2D Echocardiogram has been performed.  Haley [name] 12/22/2019, 1:16 PM

## 2019-12-22 NOTE — H&P (Signed)
Cardiology Admission History and Physical:   Patient ID: JENNINGS STIRLING MRN: [id number]; DOB: Sep 05, 1966   Admission date: 12/21/2019  Primary Care Provider: [name], NP Primary Cardiologist: [name], MD  Primary Electrophysiologist:  None   Chief Complaint:  Shortness of breath.  Patient Profile:   54 year-old female with a history of PAF, type 2 diabetes, morbid obesity, hypertension, hypothyroidism and obstructive sleep apnea followed by Dr. Claiborne Billings.  He presents with 2 weeks of acute worsening shortness of breath, central chest tightness and ankle swelling.  Cardiology is asked to admit for persistent hypoxia despite diuresis.  History of Present Illness:   This is a pleasant 54 year old female with a history of PAF, type 2 diabetes, morbid obesity, hypertension, hypothyroidism and obstructive sleep apnea.  She is followed by Dr. Claiborne Billings who saw her last in August 2020.  She has had recurrent PAF and was last cardioverted in September 2017.  She is also had A. fib in the setting of hyperthyroidism.  Noted on Eliquis.  She has severe sleep apnea with an AHI of over 100/h and has been prescribed BiPAP but never started due to lack of insurance.  She now presents with 2 weeks of worsening shortness of breath, central chest pressure and ankle edema concerning for possible heart failure.  She is noted to have a low-grade T-max of 99.2.  Labs otherwise unremarkable with normal creatinine.  High-sensitivity troponin of 10.  Suggestive of CHF with cardiomegaly and some mild pulmonary edema.  Echo in January 2020 which showed an LVEF of 60 to 65% with normal diastolic filling.  Atrial sizes were normal bilaterally.  She was given '20mg'$  IV lasix x 1 in the ED with excellent urine output. Initial plan was to discharge home given improvement in her symptoms. However, when she was trialed off of oxygen her SpO2 was noted to fall into the low 80s. A COVID swab was sent and returned negative.  Cardiology was reconsulted for admission given her persistent hypoxia.   Of note, patient states that she has been taking Eliquis only once daily. She has noted L>R LE swelling but this has been chronic. No new calf tenderness. Does report ongoing recliner orthopnea as well as PND.   Past Medical History:  Diagnosis Date  . Essential hypertension   . Fibroids   . Hyperthyroidism    a. 2010, treated w/ tapazole.  . Morbid obesity ([place])   . Paroxysmal atrial fibrillation ([place])    a. 03/2009 - in setting of hyperthyroidism and caffeine intake, converted spontaneously;  b. 08/2016 ED visit for recurrent PAF-->successful DCCV in ED.  [name] Snores   . Type II diabetes mellitus ([place])     Past Surgical History:  Procedure Laterality Date  . UTERINE FIBROID SURGERY       Medications Prior to Admission: Prior to Admission medications   Medication Sig Start Date End Date Taking? Authorizing Provider  acetaminophen (TYLENOL 8 HOUR ARTHRITIS PAIN) 650 MG CR tablet Take 650 mg by mouth every 8 (eight) hours as needed for pain.   Yes [provider]  atorvastatin (LIPITOR) 20 MG tablet Take 1 tablet (20 mg total) by mouth daily. 09/06/19  Yes [name], NP  Blood Glucose Monitoring Suppl (TRUE METRIX METER) w/Device KIT Use twice daily 02/19/19  Yes Elsie Stain, MD  diclofenac sodium (VOLTAREN) 1 % GEL Apply 4 g topically 4 (four) times daily as needed. 05/15/19  Yes [name], NP  ELIQUIS 5  MG TABS tablet TAKE 1 TABLET (5 MG TOTAL) BY MOUTH 2 (TWO) TIMES DAILY. Patient taking differently: Take 5 mg by mouth 2 (two) times daily.  11/06/19  Yes Newlin, Enobong, MD  glipiZIDE (GLUCOTROL) 10 MG tablet TAKE 1 TABLET (10 MG TOTAL) BY MOUTH 2 (TWO) TIMES DAILY BEFORE A MEAL. Patient taking differently: Take 10 mg by mouth daily.  08/23/19  Yes Elsie Stain, MD  glucose blood (TRUE METRIX BLOOD GLUCOSE TEST) test strip Use as instructed 05/15/19  Yes [name], NP  metFORMIN  (GLUCOPHAGE-XR) 500 MG 24 hr tablet Take 4 tablets (2,000 mg total) by mouth daily. Patient taking differently: Take 2,000 mg by mouth at bedtime.  09/06/19  Yes [name], NP  Methylsulfonylmethane (MSM PO) Take 2 tablets by mouth daily.   Yes [provider]  metoprolol succinate (TOPROL-XL) 50 MG 24 hr tablet Take 50 mg (1 tablet) in the mornings and 25 mg (half tablet) in the evenings Patient taking differently: Take 50 mg by mouth daily.  09/06/19  Yes [name], NP  Multiple Vitamins-Minerals (ALIVE WOMENS 50+) TABS Take 1 tablet by mouth daily.   Yes [provider]  TRUEplus Lancets 28G MISC Use twice daily for blood glucose check 02/19/19  Yes Elsie Stain, MD  TRULICITY 1.5 ZC/5.8IF SOPN INJECT 1.5 MG INTO THE SKIN ONCE A WEEK. Patient taking differently: Inject 1.5 mg into the skin once a week. Mondays 11/26/19  Yes Charlott Rakes, MD  TURMERIC PO Take 1 tablet by mouth daily.   Yes [provider]     Allergies:   No Known Allergies  Social History:   Social History   Socioeconomic History  . Marital status: Married    Spouse name: Not on file  . Number of children: Not on file  . Years of education: Not on file  . Highest education level: Not on file  Occupational History  . Occupation: cosmetologist  Tobacco Use  . Smoking status: Former Smoker    Packs/day: 1.00    Years: 32.00    Pack years: 32.00    Quit date: 2012    Years since quitting: 9.0  . Smokeless tobacco: Never Used  . Tobacco comment: patient vapes  Substance and Sexual Activity  . Alcohol use: No    Comment: occasional drink.  . Drug use: No  . Sexual activity: Yes  Other Topics Concern  . Not on file  Social History Narrative   Lives in [place] with husband.  Systems analyst.  Also in school @ [employer] for [employer].  Does not routinely exercise.   Social Determinants of Health   Financial Resource Strain:   . Difficulty of Paying Living  Expenses: Not on file  Food Insecurity:   . Worried About Charity fundraiser in the Last Year: Not on file  . Ran Out of Food in the Last Year: Not on file  Transportation Needs:   . Lack of Transportation (Medical): Not on file  . Lack of Transportation (Non-Medical): Not on file  Physical Activity:   . Days of Exercise per Week: Not on file  . Minutes of Exercise per Session: Not on file  Stress:   . Feeling of Stress : Not on file  Social Connections:   . Frequency of Communication with Friends and Family: Not on file  . Frequency of Social Gatherings with Friends and Family: Not on file  . Attends Religious Services: Not on file  .  Active Member of Clubs or Organizations: Not on file  . Attends Archivist Meetings: Not on file  . Marital Status: Not on file  Intimate Partner Violence:   . Fear of Current or Ex-Partner: Not on file  . Emotionally Abused: Not on file  . Physically Abused: Not on file  . Sexually Abused: Not on file    Family History:   The patient's family history includes Cancer in her maternal grandmother; Diabetes Mellitus II in her sister; Hypertension in her mother.    Review of Systems: [y] = yes, '[ ]'$  = no     General: Weight gain Blue.Reese ]; Weight loss '[ ]'$ ; Anorexia '[ ]'$ ; Fatigue '[ ]'$ ; Fever '[ ]'$ ; Chills '[ ]'$ ; Weakness '[ ]'$    Cardiac: Chest pain/pressure '[ ]'$ ; Resting SOB Blue.Reese ]; Exertional SOB Blue.Reese ]; Orthopnea Blue.Reese ]; Pedal Edema [ y]; Palpitations '[ ]'$ ; Syncope '[ ]'$ ; Presyncope '[ ]'$ ; Paroxysmal nocturnal dyspnea[ y]   Pulmonary: Cough '[ ]'$ ; Wheezing'[ ]'$ ; Hemoptysis'[ ]'$ ; Sputum '[ ]'$ ; Snoring '[ ]'$    GI: Vomiting'[ ]'$ ; Dysphagia'[ ]'$ ; Melena'[ ]'$ ; Hematochezia '[ ]'$ ; Heartburn'[ ]'$ ; Abdominal pain '[ ]'$ ; Constipation '[ ]'$ ; Diarrhea '[ ]'$ ; BRBPR '[ ]'$    GU: Hematuria'[ ]'$ ; Dysuria '[ ]'$ ; Nocturia'[ ]'$    Vascular: Pain in legs with walking '[ ]'$ ; Pain in feet with lying flat '[ ]'$ ; Non-healing sores '[ ]'$ ; Stroke '[ ]'$ ; TIA '[ ]'$ ; Slurred speech '[ ]'$ ;   Neuro: Headaches'[ ]'$ ; Vertigo'[ ]'$ ; Seizures[  ]; Paresthesias'[ ]'$ ;Blurred vision '[ ]'$ ; Diplopia '[ ]'$ ; Vision changes '[ ]'$    Ortho/Skin: Arthritis '[ ]'$ ; Joint pain '[ ]'$ ; Muscle pain '[ ]'$ ; Joint swelling '[ ]'$ ; Back Pain '[ ]'$ ; Rash '[ ]'$    Psych: Depression'[ ]'$ ; Anxiety'[ ]'$    Heme: Bleeding problems '[ ]'$ ; Clotting disorders '[ ]'$ ; Anemia '[ ]'$    Endocrine: Diabetes '[ ]'$ ; Thyroid dysfunction'[ ]'$   Physical Exam/Data:   Vitals:   12/21/19 2130 12/21/19 2200 12/21/19 2231 12/21/19 2335  BP: (!) 102/56 109/76 109/76 104/64  Pulse: 71 72 73 75  Resp: (!) 27 (!) 24  20  Temp:      TempSrc:      SpO2: 92% 98%  98%   No intake or output data in the 24 hours ending 12/22/19 0014 There were no vitals filed for this visit. There is no height or weight on file to calculate BMI.  General:  Well nourished, well developed, in no acute distress. On 4L O2 with SpO2 93%.  HEENT: normal Neck: no JVD Vascular: No carotid bruits; FA pulses 2+ bilaterally without bruits  Cardiac:  normal S1, S2; RRR; no murmur  Lungs:  clear to auscultation bilaterally, no wheezing, rhonchi or rales  Abd: soft, nontender, no hepatomegaly  Ext: 2+ pitting edema bilaterally.  Musculoskeletal:  No deformities, BUE and BLE strength normal and equal Skin: warm and dry  Neuro:  CNs 2-12 intact, no focal abnormalities noted Psych:  Normal affect   EKG:  The ECG that was done was personally reviewed and demonstrates SR with R axis deviation  Relevant CV Studies: 1. The left ventricle appears to be normal in size, has normal wall thickness 60-65% ejection fraction Spectral Doppler shows normal pattern of diastolic filling.  2. The right ventricle has normal size and normal systolic function.  3. Normal left atrial size.  4. Normal right atrial size.  5. Mitral valve regurgitation is trivial by color flow Doppler.  6. No atrial level shunt  detected by color flow Doppler.  7. Right ventricular systolic pressure is could not be assessed.  Laboratory Data:  Chemistry Recent Labs  Lab  12/21/19 1427  NA 141  K 4.0  CL 102  CO2 30  GLUCOSE 237*  BUN 11  CREATININE 0.52  CALCIUM 9.0  GFRNONAA >60  GFRAA >60  ANIONGAP 9    No results for input(s): PROT, ALBUMIN, AST, ALT, ALKPHOS, BILITOT in the last 168 hours. Hematology Recent Labs  Lab 12/21/19 1427  WBC 9.9  RBC 4.77  HGB 13.7  HCT 45.6  MCV 95.6  MCH 28.7  MCHC 30.0  RDW 13.7  PLT 246   Cardiac EnzymesNo results for input(s): TROPONINI in the last 168 hours. No results for input(s): TROPIPOC in the last 168 hours.  BNP Recent Labs  Lab 12/21/19 1427  BNP 205.8*    DDimer No results for input(s): DDIMER in the last 168 hours.  Radiology/Studies:  DG Chest 2 View  Result Date: 12/21/2019 CLINICAL DATA:  Chest pain, shortness of breath for 1 week, history of AFib EXAM: CHEST - 2 VIEW COMPARISON:  CT 01/24/2019, radiograph 01/24/2019 FINDINGS: Mixed hazy interstitial and airspace opacities in the lungs with some fissural thickening. There is indistinctness of pulmonary vascularity and enlargement of the cardiac silhouette when compared to prior exams. No pneumothorax or visible effusion is seen. No acute osseous or soft tissue abnormality. Degenerative changes are present in the imaged spine and shoulders. IMPRESSION: Appearance favors congestive heart failure with increased size of the cardiac silhouette likely reflecting cardiomegaly and features of interstitial and alveolar edema. Electronically Signed   By: Lovena Le M.D.   On: 12/21/2019 15:13    Assessment and Plan:   1. Hypoxic Respiratory Failure -- Initially suspected in the setting of volume overload and presumed diastolic HF, however, patient with hypoxia that seems out of proportion to pulmonary edema on CXR. Would ideally pursue CTA for PE given taking Eliquis incorrectly, but patient unable to lie flat. COVID negative. Will plan to admit and continue diuresing with goal of obtaining CTA. Will also repeat TTE and try to assess for any  shunt physiology -- Complete TTE with bubble to evaluate for shunt physiology -- Continue lasix '20mg'$  IV daily for ongoing diuresis -- CTA for PE with and without contrast when able to lie flat.  -- Will obtain LE dopplers   2. pAF -- Continue Eliquis - ensure BID dosing. -- On metoprolol '50mg'$  XL qHS (taking differently than prescribed).   3. Hx of Hyperthryoidism -- TSH low today; awaiting free T4  Severity of Illness: The appropriate patient status for this patient is INPATIENT. Inpatient status is judged to be reasonable and necessary in order to provide the required intensity of service to ensure the patient's safety. The patient's presenting symptoms, physical exam findings, and initial radiographic and laboratory data in the context of their chronic comorbidities is felt to place them at high risk for further clinical deterioration. Furthermore, it is not anticipated that the patient will be medically stable for discharge from the hospital within 2 midnights of admission. The following factors support the patient status of inpatient.   " The patient's presenting symptoms include shortness of breath.. " The worrisome physical exam findings include LE edema, hypoxia.  " The initial radiographic and laboratory data are worrisome because of elevated BNP " The chronic co-morbidities include HTN, obesity, afib.    * I certify that at the point of admission it is my  clinical judgment that the patient will require inpatient hospital care spanning beyond 2 midnights from the point of admission due to high intensity of service, high risk for further deterioration and high frequency of surveillance required.*    For questions or updates, please contact [place] Please consult [email or web address] for contact info under    Signed, Milus Banister, MD  12/22/2019 12:14 AM

## 2019-12-22 NOTE — Progress Notes (Signed)
VASCULAR LAB PRELIMINARY  PRELIMINARY  PRELIMINARY  PRELIMINARY  Bilateral lower extremity venous duplex completed.    Preliminary report:  See CV proc for preliminary results.   Haani Bakula, RVT 12/22/2019, 3:03 PM

## 2019-12-22 NOTE — Discharge Instructions (Signed)

## 2019-12-22 NOTE — ED Notes (Signed)
Patient has headache but denies need for pain medication.

## 2019-12-22 NOTE — Progress Notes (Signed)
C/O mid sternal chest tightness. Denies SOB. [place] notified. EKG done. No new changes .  Chest tightness resolving on it's own currently. No meds given except does c/o of h/a as well. APAP 650 mg po given for h/a. Will continue to monitor.

## 2019-12-22 NOTE — Progress Notes (Deleted)
   Subjective:    Patient ID: Haley [name], female    DOB: 04-11-1966, 54 y.o.   MRN: [id number]  HPI    Review of Systems     Objective:   Physical Exam        Assessment & Plan:

## 2019-12-22 NOTE — Progress Notes (Signed)
RT set up CPAP with 4L O2 bled into circuit and placed on patient. Patient tolerating CPAP well at this time. RT will monitor as needed.

## 2019-12-23 ENCOUNTER — Ambulatory Visit: Payer: Self-pay | Admitting: Critical Care Medicine

## 2019-12-23 DIAGNOSIS — E0590 Thyrotoxicosis, unspecified without thyrotoxic crisis or storm: Secondary | ICD-10-CM

## 2019-12-23 DIAGNOSIS — G4730 Sleep apnea, unspecified: Secondary | ICD-10-CM

## 2019-12-23 LAB — BLOOD GAS, ARTERIAL
Acid-Base Excess: 9.6 mmol/L — ABNORMAL HIGH (ref 0.0–2.0)
Bicarbonate: 35.2 mmol/L — ABNORMAL HIGH (ref 20.0–28.0)
Drawn by: 38340
FIO2: 40
O2 Saturation: 98 %
Patient temperature: 37
pCO2 arterial: 64.7 mmHg — ABNORMAL HIGH (ref 32.0–48.0)
pH, Arterial: 7.355 (ref 7.350–7.450)
pO2, Arterial: 110 mmHg — ABNORMAL HIGH (ref 83.0–108.0)

## 2019-12-23 LAB — BASIC METABOLIC PANEL
Anion gap: 9 (ref 5–15)
BUN: 10 mg/dL (ref 6–20)
CO2: 34 mmol/L — ABNORMAL HIGH (ref 22–32)
Calcium: 9 mg/dL (ref 8.9–10.3)
Chloride: 99 mmol/L (ref 98–111)
Creatinine, Ser: 0.47 mg/dL (ref 0.44–1.00)
GFR calc Af Amer: >60 mL/min (ref >60)
GFR calc non Af Amer: >60 mL/min (ref >60)
Glucose, Bld: 158 mg/dL — ABNORMAL HIGH (ref 70–99)
Potassium: 4.2 mmol/L (ref 3.5–5.1)
Sodium: 142 mmol/L (ref 135–145)

## 2019-12-23 LAB — GLUCOSE, CAPILLARY
Glucose-Capillary: 232 mg/dL — ABNORMAL HIGH (ref 70–99)
Glucose-Capillary: 208 mg/dL — ABNORMAL HIGH (ref 70–99)
Glucose-Capillary: 163 mg/dL — ABNORMAL HIGH (ref 70–99)
Glucose-Capillary: 216 mg/dL — ABNORMAL HIGH (ref 70–99)

## 2019-12-23 LAB — MAGNESIUM: Magnesium: 1.6 mg/dL — ABNORMAL LOW (ref 1.7–2.4)

## 2019-12-23 MED ORDER — FUROSEMIDE 20 MG PO TABS: 20.0000 mg | ORAL_TABLET | ORAL | 2 refills | Status: DC | PRN

## 2019-12-23 MED ORDER — APIXABAN 5 MG PO TABS: ORAL_TABLET | ORAL | 3 refills | Status: DC

## 2019-12-23 MED ORDER — METHIMAZOLE 5 MG PO TABS: 5.0000 mg | ORAL_TABLET | Freq: Three times a day (TID) | ORAL | 2 refills | Status: DC

## 2019-12-23 MED ORDER — METOPROLOL SUCCINATE ER 50 MG PO TB24: 50.0000 mg | ORAL_TABLET | Freq: Every day | ORAL | 3 refills | Status: DC

## 2019-12-23 MED ORDER — MAGNESIUM SULFATE 4 GM/100ML IV SOLN
4.0000 g | Freq: Once | INTRAVENOUS | Status: AC
  Administered 2019-12-23: 4 g via INTRAVENOUS
  Filled 2019-12-23: qty 100

## 2019-12-23 MED FILL — FUROSEMIDE 20 MG TABS: 20 | 30 days supply | Qty: 30 | Fill #0

## 2019-12-23 MED FILL — methIMAzole 5 MG TABS: 5 | 30 days supply | Qty: 90 | Fill #0

## 2019-12-23 NOTE — Progress Notes (Signed)
RT called for Pt desat while on Bipap auto with 4L O2 bleed in. ABG ordered 7.35/67/110/35 98%. Initially pt did not tolerate minimal pressures. Per pt she does not wear BiPAP at home. While asleep Bipap set to 18/10 with 10L O2 bleed in. When woken up Pt sat 97-99%. Pt resting comfortably at this time SPO2 99% RR 18

## 2019-12-23 NOTE — Progress Notes (Signed)
Pt de sats to 77% while sleeping and is difficulty to arouse, which occurred several times in less than 45 minutes. Pt is on CPAP. Dr. Charissa Bash was made aware and said he would order ABG respiratory therapy was made aware of above.

## 2019-12-23 NOTE — Progress Notes (Signed)
SATURATION QUALIFICATIONS:   Patient Saturations on Room Air at Rest = 85*%  Patient Saturations on Room Air while Ambulating = Unable to ambulate on room air due to oxygen requirments  Patient Saturations on 4 Liters of oxygen while Ambulating = 95%  Please briefly explain why patient needs home oxygen: Pt needs home Oxygen to maintain oxygen saturation >95%.

## 2019-12-23 NOTE — Progress Notes (Signed)
Patient is wearing an adult full face medium mask. Patient is on BiPAP   IPAP 18 EPAP10 5L of oxygen bled in the machine  No distress or complications noted, VSS. Pulse 94 RR 19 SATs 95% on 5L bled in

## 2019-12-23 NOTE — Discharge Summary (Addendum)
Discharge Summary    Patient ID: RUHANI UMLAND MRN: [id number]; DOB: Jul 13, 1966  Admit date: 12/21/2019 Discharge date: 12/23/2019  Primary Care Provider: [name], NP  Primary Cardiologist: [name], MD   Discharge Diagnoses    Principal Problem:   Acute diastolic (congestive) heart failure [facility]) Active Problems:   Acute hypoxic respiratory failure   Morbid obesity   OSA   HTN   Hyperthyroidism   DM   PAF  Diagnostic Studies/Procedures    Echo 12/22/19 with bubble study   1. Left ventricular ejection fraction, by visual estimation, is 60 to 65%. The left ventricle has normal function. There is moderately increased left ventricular hypertrophy.  2. The left ventricle has no regional wall motion abnormalities.  3. Global right ventricle has normal systolic function.The right ventricular size is normal. No increase in right ventricular wall thickness.  4. Left atrial size was mildly dilated.  5. Right atrial size was normal.  6. The mitral valve is normal in structure. Trivial mitral valve regurgitation. No evidence of mitral stenosis.  7. The tricuspid valve is normal in structure.  8. The aortic valve was not well visualized. Aortic valve regurgitation is not visualized. Mild to moderate aortic valve sclerosis/calcification without any evidence of aortic stenosis.  9. The pulmonic valve was normal in structure. Pulmonic valve regurgitation is not visualized. 10. The inferior vena cava is normal in size with greater than 50% respiratory variability, suggesting right atrial pressure of 3 mmHg. 11. The interatrial septum was not well visualized.   History of Present Illness     Haley [name] is a 54 y.o. female with a history of PAF, type 2 diabetes, morbid obesity, hypertension, hypothyroidism and obstructive sleep apnea admitted for acute diastolic CHF/acute hypoxic respiratory failure.   [name] has a history of PAF, which initially  occurred in the setting of hyperthyroidism in 2010.  She had recurrent paroxysmal atrial fibrillation and underwent cardioversion in the ED in September 2017. She has severe sleep apnea with an AHI of over 100/h and has been prescribed BiPAP but never started due to lack of insurance.  Echo in January 2020 which showed an LVEF of 60 to 65% with normal diastolic filling. Atrial sizes were normal bilaterally.  She presented with 2 weeks of worsening shortness of breath, central chest pressure and ankle edema concerning for possible heart failure. She is noted to have a low-grade T-max of 99.2.  High-sensitivity troponin of 10. CXR suggestive of CHF with cardiomegaly and some mild pulmonary edema.   She was given '20mg'$  IV lasix x 1 in the ED with excellent urine output. Initial plan was to discharge home given improvement in her symptoms. However, when she was trialed off of oxygen her SpO2 was noted to fall into the low 80s. A COVID swab was sent and returned negative. Cardiology was reconsulted for admission given her persistent hypoxia.   Of note, patient states that she has been taking Eliquis only once daily. She has noted L>R LE swelling but this has been chronic. No new calf tenderness.  Hospital Course     Consultants: None  1. Acute hypoxic respiratory failure 2nd to acute diastolic CHF & untreated sleep apnea - BNP 205. D-dimer 0.6. COVID negative. CTA of chest negative for PE however showed small airway disease or possibly multifocal inflammation. LE doppler negative for DVT. Echo showed LVEF of 60-65%, moderate LVH, no WM abnormality, normal RV function. No I & O recorded. Volume status  stable. Will give PRN lasix at discharge.   2. PAF - Maintaining sinus rhythm. Continue BB and Elqiuis.  - She was taking Eliquis daily prior to admit. Recommended BID dosage.   3. Severe OSA  - She was prescribed BiPAP as outpatient but never started due to lack of insurance. She had intermittent  hypoxia on CPAP during admit. She really needs a BiPAP. She was not using CPAP at home, says "its smells".  Consulted [facility] Freight forwarder. May consider help with [facility] as outpatient. Recommended weight loss. Seems poor health in site and literacy.   4. Morbid obesity - Recommended daily exercise and weight loss   5. Hyperthyroidism - Previously tx then fall off. Free T4 1.33. TSH 0.010.  - Started methimazole '5mg'$  TID - Outpatient endocrinology referral  6. DM - A1c 7.8 - resume home medications  Did the patient have an acute coronary syndrome (MI, NSTEMI, STEMI, etc) this admission?:  No                               Did the patient have a percutaneous coronary intervention (stent / angioplasty)?:  No.   _____________  Discharge Vitals Blood pressure 136/67, pulse 82, temperature 99.3 F (37.4 C), temperature source Oral, resp. rate 17, weight (!) 136.2 kg, SpO2 93 %.  Filed Weights   12/23/19 0500  Weight: (!) 136.2 kg   Physical Exam  Constitutional: She is oriented to person, place, and time.  Obese female in NAD  HENT:  Head: Normocephalic and atraumatic.  Eyes: Pupils are equal, round, and reactive to light.  Cardiovascular: Normal rate and regular rhythm.  Pulmonary/Chest: Effort normal and breath sounds normal.  Abdominal: Soft. Bowel sounds are normal.  Musculoskeletal:        General: Edema present.     Cervical back: Normal range of motion and neck supple.  Neurological: She is alert and oriented to person, place, and time.  Skin: Skin is warm and dry.  Psychiatric: Affect normal.     Labs & Radiologic Studies    CBC Recent Labs    12/21/19 1427  WBC 9.9  HGB 13.7  HCT 45.6  MCV 95.6  PLT 212   Basic Metabolic Panel Recent Labs    12/22/19 0125 12/23/19 0420  NA 140 142  K 3.7 4.2  CL 98 99  CO2 32 34*  GLUCOSE 264* 158*  BUN 10 10  CREATININE 0.51 0.47  CALCIUM 8.9 9.0  MG 1.5* 1.6*    High Sensitivity Troponin:   Recent  Labs  Lab 12/21/19 1427 12/21/19 1555  TROPONINIHS 10 7    D-Dimer Recent Labs    12/22/19 0434  DDIMER 0.60*   Hemoglobin A1C Recent Labs    12/22/19 0125  HGBA1C 7.8*   Thyroid Function Tests Recent Labs    12/21/19 1640  TSH 0.010*   _____________  DG Chest 2 View  Result Date: 12/21/2019 CLINICAL DATA:  Chest pain, shortness of breath for 1 week, history of AFib EXAM: CHEST - 2 VIEW COMPARISON:  CT 01/24/2019, radiograph 01/24/2019 FINDINGS: Mixed hazy interstitial and airspace opacities in the lungs with some fissural thickening. There is indistinctness of pulmonary vascularity and enlargement of the cardiac silhouette when compared to prior exams. No pneumothorax or visible effusion is seen. No acute osseous or soft tissue abnormality. Degenerative changes are present in the imaged spine and shoulders. IMPRESSION: Appearance favors congestive heart  failure with increased size of the cardiac silhouette likely reflecting cardiomegaly and features of interstitial and alveolar edema. Electronically Signed   By: Lovena Le M.D.   On: 12/21/2019 15:13   CT ANGIO CHEST PE W OR WO CONTRAST  Result Date: 12/22/2019 CLINICAL DATA:  Shortness of breath, elevated D-dimer level. EXAM: CT ANGIOGRAPHY CHEST WITH CONTRAST TECHNIQUE: Multidetector CT imaging of the chest was performed using the standard protocol during bolus administration of intravenous contrast. Multiplanar CT image reconstructions and MIPs were obtained to evaluate the vascular anatomy. CONTRAST:  110m OMNIPAQUE IOHEXOL 350 MG/ML SOLN COMPARISON:  January 24, 2019. FINDINGS: Cardiovascular: Satisfactory opacification of the pulmonary arteries to the segmental level. No evidence of pulmonary embolism. Mild cardiomegaly is noted. No pericardial effusion. Mediastinum/Nodes: Stable large thyroid goiter is noted extending into retrosternal space. No adenopathy is noted. Esophagus is unremarkable. Lungs/Pleura: No pneumothorax  or pleural effusion is noted. Mosaic pattern is noted throughout both lungs which may represent small airway disease or possibly multifocal inflammation. Upper Abdomen: No acute abnormality. Musculoskeletal: No chest wall abnormality. No acute or significant osseous findings. Review of the MIP images confirms the above findings. IMPRESSION: 1. No definite evidence of pulmonary embolus. 2. Stable large thyroid goiter extending into retrosternal space. 3. Mosaic pattern is noted throughout both lungs which may represent small airway disease or possibly multifocal inflammation. Electronically Signed   By: JMarijo ConceptionM.D.   On: 12/22/2019 11:32   ECHOCARDIOGRAM COMPLETE BUBBLE STUDY  Result Date: 12/22/2019   ECHOCARDIOGRAM REPORT   Patient Name:   Lillion A [name] Date of Exam: 12/22/2019 Medical Rec #:  [id number]          Height:       67.0 in Accession #:    [id number]         Weight:       295.2 lb Date of Birth:  [birth year]          BSA:          2.39 m Patient Age:    89years            BP:           122/55 mmHg Patient Gender: F                   HR:           80 bpm. Exam Location:  Inpatient Procedure: 2D Echo, Color Doppler, Limited Color Doppler and Saline Contrast            Bubble Study Indications:    PFO  History:        Patient has prior history of Echocardiogram examinations, most                 recent 01/02/2019. CHF, Abnormal ECG, Arrythmias:Atrial                 Fibrillation, Signs/Symptoms:Chest Pain and Dyspnea; Risk                 Factors:Hypertension, Diabetes, Dyslipidemia and Sleep Apnea.                 Hypoxia.  Sonographer:    GRadene GunningReferring Phys: [id number] Banister Sonographer Comments: Technically difficult study due to poor echo windows and patient is morbidly obese. Image acquisition challenging due to patient body habitus. IMPRESSIONS  1. Left ventricular ejection fraction, by visual estimation, is 60 to 65%.  The left ventricle has normal function. There is  moderately increased left ventricular hypertrophy.  2. The left ventricle has no regional wall motion abnormalities.  3. Global right ventricle has normal systolic function.The right ventricular size is normal. No increase in right ventricular wall thickness.  4. Left atrial size was mildly dilated.  5. Right atrial size was normal.  6. The mitral valve is normal in structure. Trivial mitral valve regurgitation. No evidence of mitral stenosis.  7. The tricuspid valve is normal in structure.  8. The aortic valve was not well visualized. Aortic valve regurgitation is not visualized. Mild to moderate aortic valve sclerosis/calcification without any evidence of aortic stenosis.  9. The pulmonic valve was normal in structure. Pulmonic valve regurgitation is not visualized. 10. The inferior vena cava is normal in size with greater than 50% respiratory variability, suggesting right atrial pressure of 3 mmHg. 11. The interatrial septum was not well visualized. FINDINGS  Left Ventricle: Left ventricular ejection fraction, by visual estimation, is 60 to 65%. The left ventricle has normal function. The left ventricle has no regional wall motion abnormalities. There is moderately increased left ventricular hypertrophy. Normal left atrial pressure. Right Ventricle: The right ventricular size is normal. No increase in right ventricular wall thickness. Global RV systolic function is has normal systolic function. Left Atrium: Left atrial size was mildly dilated. Right Atrium: Right atrial size was normal in size Pericardium: There is no evidence of pericardial effusion. Mitral Valve: The mitral valve is normal in structure. There is mild thickening of the mitral valve leaflet(s). There is mild calcification of the mitral valve leaflet(s). Trivial mitral valve regurgitation. No evidence of mitral valve stenosis by observation. Tricuspid Valve: The tricuspid valve is normal in structure. Tricuspid valve regurgitation is trivial.  Aortic Valve: The aortic valve was not well visualized. Aortic valve regurgitation is not visualized. Mild to moderate aortic valve sclerosis/calcification is present, without any evidence of aortic stenosis. Pulmonic Valve: The pulmonic valve was normal in structure. Pulmonic valve regurgitation is not visualized. Pulmonic regurgitation is not visualized. Aorta: The aortic root, ascending aorta and aortic arch are all structurally normal, with no evidence of dilitation or obstruction. Venous: The inferior vena cava is normal in size with greater than 50% respiratory variability, suggesting right atrial pressure of 3 mmHg. IAS/Shunts: The interatrial septum was not well visualized. Agitated saline contrast was given intravenously to evaluate for intracardiac shunting. There is no evidence of a patent foramen ovale. No ventricular septal defect is seen or detected. There is  no evidence of an atrial septal defect.  LEFT VENTRICLE PLAX 2D LVIDd:         4.55 cm       Diastology LVIDs:         2.30 cm       LV e' lateral:   10.90 cm/s LV PW:         1.50 cm       LV E/e' lateral: 12.0 LV IVS:        1.45 cm       LV e' medial:    8.16 cm/s LVOT diam:     1.80 cm       LV E/e' medial:  16.1 LV SV:         77 ml LV SV Index:   29.65 LVOT Area:     2.54 cm  LV Volumes (MOD) LV area d, A2C:    29.90 cm LV area d, A4C:  28.20 cm LV area s, A2C:    15.70 cm LV area s, A4C:    13.80 cm LV major d, A2C:   7.49 cm LV major d, A4C:   7.49 cm LV major s, A2C:   6.31 cm LV major s, A4C:   6.09 cm LV vol d, MOD A2C: 98.2 ml LV vol d, MOD A4C: 86.1 ml LV vol s, MOD A2C: 33.5 ml LV vol s, MOD A4C: 26.5 ml LV SV MOD A2C:     64.7 ml LV SV MOD A4C:     86.1 ml LV SV MOD BP:      61.6 ml RIGHT VENTRICLE             IVC RV S prime:     11.10 cm/s  IVC diam: 2.70 cm TAPSE (M-mode): 2.7 cm LEFT ATRIUM             Index       RIGHT ATRIUM           Index LA diam:        3.90 cm 1.63 cm/m  RA Area:     15.60 cm LA Vol (A2C):   45.2  ml 18.94 ml/m RA Volume:   46.00 ml  19.28 ml/m LA Vol (A4C):   53.0 ml 22.21 ml/m LA Biplane Vol: 52.4 ml 21.96 ml/m  AORTIC VALVE LVOT Vmax:   163.00 cm/s LVOT Vmean:  107.000 cm/s LVOT VTI:    0.321 m  AORTA Ao Root diam: 3.30 cm MITRAL VALVE MV Area (PHT): 3.42 cm              SHUNTS MV PHT:        64.38 msec            Systemic VTI:  0.32 m MV Decel Time: 222 msec              Systemic Diam: 1.80 cm MV E velocity: 131.00 cm/s 103 cm/s MV A velocity: 94.60 cm/s  70.3 cm/s MV E/A ratio:  1.38        1.5  Jenkins Rouge MD Electronically signed by Jenkins Rouge MD Signature Date/Time: 12/22/2019/2:59:00 PM    Final    VAS [place] LOWER EXTREMITY VENOUS (DVT)  Result Date: 12/22/2019  Lower Venous Study Indications: Edema, and SOB.  Limitations: Body habitus. Comparison Study: Prior left lower extremity venous duplex from 01/01/19 is                   available for comparison Performing Technologist: Sharion Dove RVS  Examination Guidelines: A complete evaluation includes B-mode imaging, spectral Doppler, color Doppler, and power Doppler as needed of all accessible portions of each vessel. Bilateral testing is considered an integral part of a complete examination. Limited examinations for reoccurring indications may be performed as noted.  +---------+---------------+---------+-----------+----------+--------------+ RIGHT    CompressibilityPhasicitySpontaneityPropertiesThrombus Aging +---------+---------------+---------+-----------+----------+--------------+ CFV      Full                                         pulsatile      +---------+---------------+---------+-----------+----------+--------------+ SFJ      Full                                                        +---------+---------------+---------+-----------+----------+--------------+  FV Prox  Full                                                        +---------+---------------+---------+-----------+----------+--------------+ FV  Mid   Full                                                        +---------+---------------+---------+-----------+----------+--------------+ FV DistalFull                                                        +---------+---------------+---------+-----------+----------+--------------+ PFV      Full                                                        +---------+---------------+---------+-----------+----------+--------------+ POP      Full           Yes      Yes                                 +---------+---------------+---------+-----------+----------+--------------+ PTV      Full                                                        +---------+---------------+---------+-----------+----------+--------------+ PERO     Full                                                        +---------+---------------+---------+-----------+----------+--------------+   +---------+---------------+---------+-----------+----------+--------------+ LEFT     CompressibilityPhasicitySpontaneityPropertiesThrombus Aging +---------+---------------+---------+-----------+----------+--------------+ CFV      Full                                         pulsatile      +---------+---------------+---------+-----------+----------+--------------+ SFJ      Full                                                        +---------+---------------+---------+-----------+----------+--------------+ FV Prox  Full                                                        +---------+---------------+---------+-----------+----------+--------------+  FV Mid   Full                                                        +---------+---------------+---------+-----------+----------+--------------+ FV DistalFull                                                        +---------+---------------+---------+-----------+----------+--------------+ PFV      Full                                                         +---------+---------------+---------+-----------+----------+--------------+ POP      Full           Yes      Yes                                 +---------+---------------+---------+-----------+----------+--------------+ PTV      Full                                                        +---------+---------------+---------+-----------+----------+--------------+ PERO     Full                                                        +---------+---------------+---------+-----------+----------+--------------+     Summary: Right: There is no evidence of deep vein thrombosis in the lower extremity. Left: There is no evidence of deep vein thrombosis in the lower extremity.  *See table(s) above for measurements and observations. Electronically signed by Monica Martinez MD on 12/22/2019 at 4:01:06 PM.    Final    Disposition   Pt is being discharged home today in good condition.  Follow-up Plans & Appointments    Follow-up Information    Troy Sine, MD. Go on 01/16/2020.   Specialty: Cardiology Why: '@2'$ :40am for hospital follow up Contact information: [address] [place] [address] [phone]          Discharge Instructions    Ambulatory referral to Endocrinology   Complete by: As directed    Needs to be seen in 4 weeks. If no opening, other provider is okay   Diet - low sodium heart healthy   Complete by: As directed    Increase activity slowly   Complete by: As directed       Discharge Medications   Allergies as of 12/23/2019   No Known Allergies     Medication List    TAKE these medications   Alive Womens 50+ Tabs Take 1 tablet by mouth daily.   apixaban 5 MG Tabs tablet Commonly known as: Eliquis TAKE 1 TABLET (5  MG TOTAL) BY MOUTH 2 (TWO) TIMES DAILY. What changed: See the new instructions.   atorvastatin 20 MG tablet Commonly known as: LIPITOR Take 1 tablet (20 mg total) by mouth daily.     diclofenac sodium 1 % Gel Commonly known as: VOLTAREN Apply 4 g topically 4 (four) times daily as needed.   furosemide 20 MG tablet Commonly known as: Lasix Take 1 tablet (20 mg total) by mouth as needed for edema.   glipiZIDE 10 MG tablet Commonly known as: GLUCOTROL TAKE 1 TABLET (10 MG TOTAL) BY MOUTH 2 (TWO) TIMES DAILY BEFORE A MEAL. What changed: when to take this   glucose blood test strip Commonly known as: True Metrix Blood Glucose Test Use as instructed   metFORMIN 500 MG 24 hr tablet Commonly known as: GLUCOPHAGE-XR Take 4 tablets (2,000 mg total) by mouth daily. What changed: when to take this   methimazole 5 MG tablet Commonly known as: TAPAZOLE Take 1 tablet (5 mg total) by mouth 3 (three) times daily.   metoprolol succinate 50 MG 24 hr tablet Commonly known as: TOPROL-XL Take 1 tablet (50 mg total) by mouth daily.   MSM PO Take 2 tablets by mouth daily.   True Metrix Meter w/Device Kit Use twice daily   TRUEplus Lancets 28G Misc Use twice daily for blood glucose check   Trulicity 1.5 LL/9.7IX Sopn Generic drug: Dulaglutide INJECT 1.5 MG INTO THE SKIN ONCE A WEEK. What changed: additional instructions   TURMERIC PO Take 1 tablet by mouth daily.   Tylenol 8 Hour Arthritis Pain 650 MG CR tablet Generic drug: acetaminophen Take 650 mg by mouth every 8 (eight) hours as needed for pain.          Outstanding Labs/Studies   BiPAP initiation   Duration of Discharge Encounter   Greater than 30 minutes including physician time.  Jarrett Soho, PA 12/23/2019, 11:21 AM

## 2019-12-23 NOTE — TOC Initial Note (Signed)
Transition of Care [facility]) - Initial/Assessment Note    Patient Details  Name: Haley [name] MRN: [id number] Date of Birth: [birth year]  Transition of Care [facility]) CM/SW Contact:    Bethena Roys, RN Phone Number: 12/23/2019, 2:58 PM  Clinical Narrative: Patient very sleepy upon initial meeting. Case Manager discussed with patient regarding BIPAP for home- last sleep study noted was Jan of 2018. Patient has CPAP in the room; however needs BIPAP for home. Prior to arrival from home with spouse. Patient states she works part-time. Case Manager asked patient if [employer] Liaison could speak with patient about BIPAP-patient is willing to pay for BIPAP with a credit card. Patient will also need Durable medical equipment oxygen. Patient will have the portable concentrator delivered to the room prior to transition home and additional equipment delivered to home address. Case Manager has spoken to Clinical Social Worker with the Heart Failure Team and she will try to see if the patient is eligible for Medicaid. Case Manager had also left a voicemail for [employer] Counselor covering the patient. [employer] to make sure the patient has what she needs for home. No further needs from Case Manager at this time.                  Expected Discharge Plan: Home/Self Care Barriers to Discharge: No Barriers Identified   Patient Goals and CMS Choice Patient states their goals for this hospitalization and ongoing recovery are:: "to return home with BIPAP"      Expected Discharge Plan and Services Expected Discharge Plan: Home/Self Care In-house Referral: NA Discharge Planning Services: CM Consult Post Acute Care Choice: Durable Medical Equipment Living arrangements for the past 2 months: Apartment Expected Discharge Date: 12/23/19               DME Arranged: Carolan Clines, Oxygen DME Agency: [employer] Date DME Agency Contacted: 12/23/19 Time DME Agency Contacted: 76 Representative spoke with at DME  Agency: [place] Arranged: NA          Prior Living Arrangements/Services Living arrangements for the past 2 months: Apartment Lives with:: Spouse Patient language and need for interpreter reviewed:: Yes Do you feel safe going back to the place where you live?: Yes      Need for Family Participation in Patient Care: Yes (Comment) Care giver support system in place?: Yes (comment)   Criminal Activity/Legal Involvement Pertinent to Current Situation/Hospitalization: No - Comment as needed  Activities of Daily Living      Permission Sought/Granted Permission sought to share information with : Family Supports, Investment banker, corporate granted to share info w AGENCY: [employer]        Emotional Assessment Appearance:: Appears stated age Attitude/Demeanor/Rapport: Engaged Affect (typically observed): Appropriate Orientation: : Oriented to Situation, Oriented to  Time, Oriented to Place, Oriented to Self Alcohol / Substance Use: Not Applicable Psych Involvement: No (comment)  Admission diagnosis:  Hypoxia [R09.02] Acute congestive heart failure, unspecified heart failure type [facility]) [I50.9] Patient Active Problem List   Diagnosis Date Noted  . Acute diastolic (congestive) heart failure ([place]) 12/21/2019  . Hypoxia 12/21/2019  . 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 Current use of long term anticoagulation 02/19/2019  . Colon cancer screening 02/19/2019  . Pure hypercholesterolemia 02/19/2019  . Breast cancer screening 02/19/2019  . Morbid obesity ([place]) 01/27/2019  . Hypomagnesemia 01/27/2019  . Type II diabetes mellitus ([place]) 01/01/2019  . Severe sleep apnea 01/14/2017  . Hypoxemia 01/14/2017  .  Paroxysmal atrial fibrillation (HCC)   . Essential hypertension    PCP:  [name], NP Pharmacy:   [facility], [place] [address] [place] [address] Phone: [phone] Fax:  [phone]  [facility] [place], [place] - [address] [place] [address] Phone: [phone] Fax: [phone]     Social Determinants of Health (SDOH) Interventions    Readmission Risk Interventions No flowsheet data found.

## 2019-12-24 DIAGNOSIS — G478 Other sleep disorders: Secondary | ICD-10-CM

## 2019-12-24 DIAGNOSIS — E0500 Thyrotoxicosis with diffuse goiter without thyrotoxic crisis or storm: Secondary | ICD-10-CM

## 2019-12-24 DIAGNOSIS — I10 Essential (primary) hypertension: Secondary | ICD-10-CM

## 2019-12-24 LAB — BASIC METABOLIC PANEL
Anion gap: 9 (ref 5–15)
BUN: 12 mg/dL (ref 6–20)
CO2: 32 mmol/L (ref 22–32)
Calcium: 8.8 mg/dL — ABNORMAL LOW (ref 8.9–10.3)
Chloride: 99 mmol/L (ref 98–111)
Creatinine, Ser: 0.51 mg/dL (ref 0.44–1.00)
GFR calc Af Amer: >60 mL/min (ref >60)
GFR calc non Af Amer: >60 mL/min (ref >60)
Glucose, Bld: 184 mg/dL — ABNORMAL HIGH (ref 70–99)
Potassium: 4.1 mmol/L (ref 3.5–5.1)
Sodium: 140 mmol/L (ref 135–145)

## 2019-12-24 LAB — MAGNESIUM: Magnesium: 1.9 mg/dL (ref 1.7–2.4)

## 2019-12-24 LAB — GLUCOSE, CAPILLARY
Glucose-Capillary: 150 mg/dL — ABNORMAL HIGH (ref 70–99)
Glucose-Capillary: 301 mg/dL — ABNORMAL HIGH (ref 70–99)

## 2019-12-24 NOTE — Progress Notes (Signed)
Progress Note  Patient Name: Haley [name] Date of Encounter: 12/24/2019  Primary Cardiologist: [name], MD   Subjective   Sleeping comfortably, did not answer any questions  Inpatient Medications    Scheduled Meds: . apixaban  5 mg Oral BID  . atorvastatin  20 mg Oral Daily  . insulin aspart  0-15 Units Subcutaneous TID WC  . insulin aspart  0-5 Units Subcutaneous QHS  . methimazole  5 mg Oral TID  . metoprolol succinate  50 mg Oral QHS  . sodium chloride flush  3 mL Intravenous Once  . sodium chloride flush  3 mL Intravenous Q12H   Continuous Infusions: . sodium chloride     PRN Meds: sodium chloride, acetaminophen, nitroGLYCERIN, sodium chloride flush   Vital Signs    Vitals:   12/23/19 1459 12/23/19 2038 12/23/19 2311 12/24/19 0500  BP: (!) 115/54 (!) 150/60  118/66  Pulse: 73 89 94 65  Resp:  20 20 18   Temp: 98.5 F (36.9 C) 99.1 F (37.3 C)  98.3 F (36.8 C)  TempSrc: Oral Oral  Axillary  SpO2:  100% 95% 93%  Weight:        Intake/Output Summary (Last 24 hours) at 12/24/2019 0815 Last data filed at 12/23/2019 2000 Gross per 24 hour  Intake 946 ml  Output --  Net 946 ml   Last 3 Weights 12/23/2019 11/14/2019 09/06/2019  Weight (lbs) 300 lb 4.8 oz 295 lb 3.2 oz 298 lb  Weight (kg) 136.215 kg 133.902 kg 135.172 kg      Telemetry    NSR with rare transient burst of SVT (no more than 5 seconds) - Personally Reviewed  ECG    NSR without significant ST-T wave changes - Personally Reviewed  Physical Exam   GEN: No acute distress.   Neck: No JVD Cardiac: RRR, no murmurs, rubs, or gallops.  Respiratory: Clear to auscultation bilaterally. GI: Soft, nontender, non-distended  MS: No edema; No deformity. Neuro:  Nonfocal  Psych: Normal affect   Labs    High Sensitivity Troponin:   Recent Labs  Lab 12/21/19 1427 12/21/19 1555  TROPONINIHS 10 7      Chemistry Recent Labs  Lab 12/22/19 0125 12/23/19 0420 12/24/19 0417  NA 140  142 140  K 3.7 4.2 4.1  CL 98 99 99  CO2 32 34* 32  GLUCOSE 264* 158* 184*  BUN 10 10 12   CREATININE 0.51 0.47 0.51  CALCIUM 8.9 9.0 8.8*  GFRNONAA >60 >60 >60  GFRAA >60 >60 >60  ANIONGAP 10 9 9      Hematology Recent Labs  Lab 12/21/19 1427  WBC 9.9  RBC 4.77  HGB 13.7  HCT 45.6  MCV 95.6  MCH 28.7  MCHC 30.0  RDW 13.7  PLT 246    BNP Recent Labs  Lab 12/21/19 1427  BNP 205.8*     DDimer  Recent Labs  Lab 12/22/19 0434  DDIMER 0.60*     Radiology    CT ANGIO CHEST PE W OR WO CONTRAST  Result Date: 12/22/2019 CLINICAL DATA:  Shortness of breath, elevated D-dimer level. EXAM: CT ANGIOGRAPHY CHEST WITH CONTRAST TECHNIQUE: Multidetector CT imaging of the chest was performed using the standard protocol during bolus administration of intravenous contrast. Multiplanar CT image reconstructions and MIPs were obtained to evaluate the vascular anatomy. CONTRAST:  18mL OMNIPAQUE IOHEXOL 350 MG/ML SOLN COMPARISON:  January 24, 2019. FINDINGS: Cardiovascular: Satisfactory opacification of the pulmonary arteries to the segmental level. No evidence  of pulmonary embolism. Mild cardiomegaly is noted. No pericardial effusion. Mediastinum/Nodes: Stable large thyroid goiter is noted extending into retrosternal space. No adenopathy is noted. Esophagus is unremarkable. Lungs/Pleura: No pneumothorax or pleural effusion is noted. Mosaic pattern is noted throughout both lungs which may represent small airway disease or possibly multifocal inflammation. Upper Abdomen: No acute abnormality. Musculoskeletal: No chest wall abnormality. No acute or significant osseous findings. Review of the MIP images confirms the above findings. IMPRESSION: 1. No definite evidence of pulmonary embolus. 2. Stable large thyroid goiter extending into retrosternal space. 3. Mosaic pattern is noted throughout both lungs which may represent small airway disease or possibly multifocal inflammation. Electronically Signed    By: Marijo Conception M.D.   On: 12/22/2019 11:32   ECHOCARDIOGRAM COMPLETE BUBBLE STUDY  Result Date: 12/22/2019   ECHOCARDIOGRAM REPORT   Patient Name:   Haley [name] Date of Exam: 12/22/2019 Medical Rec #:  [id number]           Height:       67.0 in Accession #:    [id number]          Weight:       295.2 lb Date of Birth:  April 02, 1966           BSA:          2.39 m Patient Age:    54 years            BP:           122/55 mmHg Patient Gender: F                   HR:           80 bpm. Exam Location:  Inpatient Procedure: 2D Echo, Color Doppler, Limited Color Doppler and Saline Contrast            Bubble Study Indications:    PFO  History:        Patient has prior history of Echocardiogram examinations, most                 recent 01/02/2019. CHF, Abnormal ECG, Arrythmias:Atrial                 Fibrillation, Signs/Symptoms:Chest Pain and Dyspnea; Risk                 Factors:Hypertension, Diabetes, Dyslipidemia and Sleep Apnea.                 Hypoxia.  Sonographer:    Radene Gunning Referring Phys: [id number] Milus Banister  Sonographer Comments: Technically difficult study due to poor echo windows and patient is morbidly obese. Image acquisition challenging due to patient body habitus. IMPRESSIONS  1. Left ventricular ejection fraction, by visual estimation, is 60 to 65%. The left ventricle has normal function. There is moderately increased left ventricular hypertrophy.  2. The left ventricle has no regional wall motion abnormalities.  3. Global right ventricle has normal systolic function.The right ventricular size is normal. No increase in right ventricular wall thickness.  4. Left atrial size was mildly dilated.  5. Right atrial size was normal.  6. The mitral valve is normal in structure. Trivial mitral valve regurgitation. No evidence of mitral stenosis.  7. The tricuspid valve is normal in structure.  8. The aortic valve was not well visualized. Aortic valve regurgitation is not visualized. Mild to  moderate aortic valve sclerosis/calcification without any evidence of aortic stenosis.  9. The pulmonic valve  was normal in structure. Pulmonic valve regurgitation is not visualized. 10. The inferior vena cava is normal in size with greater than 50% respiratory variability, suggesting right atrial pressure of 3 mmHg. 11. The interatrial septum was not well visualized. FINDINGS  Left Ventricle: Left ventricular ejection fraction, by visual estimation, is 60 to 65%. The left ventricle has normal function. The left ventricle has no regional wall motion abnormalities. There is moderately increased left ventricular hypertrophy. Normal left atrial pressure. Right Ventricle: The right ventricular size is normal. No increase in right ventricular wall thickness. Global RV systolic function is has normal systolic function. Left Atrium: Left atrial size was mildly dilated. Right Atrium: Right atrial size was normal in size Pericardium: There is no evidence of pericardial effusion. Mitral Valve: The mitral valve is normal in structure. There is mild thickening of the mitral valve leaflet(s). There is mild calcification of the mitral valve leaflet(s). Trivial mitral valve regurgitation. No evidence of mitral valve stenosis by observation. Tricuspid Valve: The tricuspid valve is normal in structure. Tricuspid valve regurgitation is trivial. Aortic Valve: The aortic valve was not well visualized. Aortic valve regurgitation is not visualized. Mild to moderate aortic valve sclerosis/calcification is present, without any evidence of aortic stenosis. Pulmonic Valve: The pulmonic valve was normal in structure. Pulmonic valve regurgitation is not visualized. Pulmonic regurgitation is not visualized. Aorta: The aortic root, ascending aorta and aortic arch are all structurally normal, with no evidence of dilitation or obstruction. Venous: The inferior vena cava is normal in size with greater than 50% respiratory variability, suggesting  right atrial pressure of 3 mmHg. IAS/Shunts: The interatrial septum was not well visualized. Agitated saline contrast was given intravenously to evaluate for intracardiac shunting. There is no evidence of a patent foramen ovale. No ventricular septal defect is seen or detected. There is  no evidence of an atrial septal defect.  LEFT VENTRICLE PLAX 2D LVIDd:         4.55 cm       Diastology LVIDs:         2.30 cm       LV e' lateral:   10.90 cm/s LV PW:         1.50 cm       LV E/e' lateral: 12.0 LV IVS:        1.45 cm       LV e' medial:    8.16 cm/s LVOT diam:     1.80 cm       LV E/e' medial:  16.1 LV SV:         77 ml LV SV Index:   29.65 LVOT Area:     2.54 cm  LV Volumes (MOD) LV area d, A2C:    29.90 cm LV area d, A4C:    28.20 cm LV area s, A2C:    15.70 cm LV area s, A4C:    13.80 cm LV major d, A2C:   7.49 cm LV major d, A4C:   7.49 cm LV major s, A2C:   6.31 cm LV major s, A4C:   6.09 cm LV vol d, MOD A2C: 98.2 ml LV vol d, MOD A4C: 86.1 ml LV vol s, MOD A2C: 33.5 ml LV vol s, MOD A4C: 26.5 ml LV SV MOD A2C:     64.7 ml LV SV MOD A4C:     86.1 ml LV SV MOD BP:      61.6 ml RIGHT VENTRICLE  IVC RV S prime:     11.10 cm/s  IVC diam: 2.70 cm TAPSE (M-mode): 2.7 cm LEFT ATRIUM             Index       RIGHT ATRIUM           Index LA diam:        3.90 cm 1.63 cm/m  RA Area:     15.60 cm LA Vol (A2C):   45.2 ml 18.94 ml/m RA Volume:   46.00 ml  19.28 ml/m LA Vol (A4C):   53.0 ml 22.21 ml/m LA Biplane Vol: 52.4 ml 21.96 ml/m  AORTIC VALVE LVOT Vmax:   163.00 cm/s LVOT Vmean:  107.000 cm/s LVOT VTI:    0.321 m  AORTA Ao Root diam: 3.30 cm MITRAL VALVE MV Area (PHT): 3.42 cm              SHUNTS MV PHT:        64.38 msec            Systemic VTI:  0.32 m MV Decel Time: 222 msec              Systemic Diam: 1.80 cm MV E velocity: 131.00 cm/s 103 cm/s MV A velocity: 94.60 cm/s  70.3 cm/s MV E/A ratio:  1.38        1.5  Jenkins Rouge MD Electronically signed by Jenkins Rouge MD Signature Date/Time:  12/22/2019/2:59:00 PM    Final    VAS [place] LOWER EXTREMITY VENOUS (DVT)  Result Date: 12/22/2019  Lower Venous Study Indications: Edema, and SOB.  Limitations: Body habitus. Comparison Study: Prior left lower extremity venous duplex from 01/01/19 is                   available for comparison Performing Technologist: Sharion Dove RVS  Examination Guidelines: A complete evaluation includes B-mode imaging, spectral Doppler, color Doppler, and power Doppler as needed of all accessible portions of each vessel. Bilateral testing is considered an integral part of a complete examination. Limited examinations for reoccurring indications may be performed as noted.  +---------+---------------+---------+-----------+----------+--------------+ RIGHT    CompressibilityPhasicitySpontaneityPropertiesThrombus Aging +---------+---------------+---------+-----------+----------+--------------+ CFV      Full                                         pulsatile      +---------+---------------+---------+-----------+----------+--------------+ SFJ      Full                                                        +---------+---------------+---------+-----------+----------+--------------+ FV Prox  Full                                                        +---------+---------------+---------+-----------+----------+--------------+ FV Mid   Full                                                        +---------+---------------+---------+-----------+----------+--------------+  FV DistalFull                                                        +---------+---------------+---------+-----------+----------+--------------+ PFV      Full                                                        +---------+---------------+---------+-----------+----------+--------------+ POP      Full           Yes      Yes                                  +---------+---------------+---------+-----------+----------+--------------+ PTV      Full                                                        +---------+---------------+---------+-----------+----------+--------------+ PERO     Full                                                        +---------+---------------+---------+-----------+----------+--------------+   +---------+---------------+---------+-----------+----------+--------------+ LEFT     CompressibilityPhasicitySpontaneityPropertiesThrombus Aging +---------+---------------+---------+-----------+----------+--------------+ CFV      Full                                         pulsatile      +---------+---------------+---------+-----------+----------+--------------+ SFJ      Full                                                        +---------+---------------+---------+-----------+----------+--------------+ FV Prox  Full                                                        +---------+---------------+---------+-----------+----------+--------------+ FV Mid   Full                                                        +---------+---------------+---------+-----------+----------+--------------+ FV DistalFull                                                        +---------+---------------+---------+-----------+----------+--------------+  PFV      Full                                                        +---------+---------------+---------+-----------+----------+--------------+ POP      Full           Yes      Yes                                 +---------+---------------+---------+-----------+----------+--------------+ PTV      Full                                                        +---------+---------------+---------+-----------+----------+--------------+ PERO     Full                                                         +---------+---------------+---------+-----------+----------+--------------+     Summary: Right: There is no evidence of deep vein thrombosis in the lower extremity. Left: There is no evidence of deep vein thrombosis in the lower extremity.  *See table(s) above for measurements and observations. Electronically signed by Monica Martinez MD on 12/22/2019 at 4:01:06 PM.    Final     Cardiac Studies   Echo 12/22/2019  1. Left ventricular ejection fraction, by visual estimation, is 60 to 65%. The left ventricle has normal function. There is moderately increased left ventricular hypertrophy.  2. The left ventricle has no regional wall motion abnormalities.  3. Global right ventricle has normal systolic function.The right ventricular size is normal. No increase in right ventricular wall thickness.  4. Left atrial size was mildly dilated.  5. Right atrial size was normal.  6. The mitral valve is normal in structure. Trivial mitral valve regurgitation. No evidence of mitral stenosis.  7. The tricuspid valve is normal in structure.  8. The aortic valve was not well visualized. Aortic valve regurgitation is not visualized. Mild to moderate aortic valve sclerosis/calcification without any evidence of aortic stenosis.  9. The pulmonic valve was normal in structure. Pulmonic valve regurgitation is not visualized. 10. The inferior vena cava is normal in size with greater than 50% respiratory variability, suggesting right atrial pressure of 3 mmHg. 11. The interatrial septum was not well visualized.  Patient Profile     55 y.o. female with PMH of PAF, DM II, morbid obesity, HTN, hyperthyroidism and OSA presented with 2 week onset of worsening dyspnea. Initially felt to be volume overloaded, however degree of hypoxia is out of proportion, concerning for PE as patient was not taking her eliquis correctly. Difficult to obtain CT as patient unable to lay flat.   Assessment & Plan    1. Hypoxic respiratory  failure  - elevated D-dimer, CTA of chest 1/17 negative for PE, stable large thyroid goiter extending into retrosternal space. Mosaic pattern noted throughout both luing which may represent small airway disease or possibly multifocal inflammation. COVID test negative 1/16  -  venous doppler negative for DVT  - Echo 12/22/2019 EF 60-65%, moderate LVH.  - will setup home O2 during the day. O2 sat 85% at rest  - discharge delayed to wait for BiPAP and home O2 delivery.  2. PAF on eliquis  3. Hyperthyroidism: started on tapazole  4. DM II: hemoglobin A1C 7.8  5. Severe OSA: plan to setup on BiPAP  For questions or updates, please contact [place] [facility] [place] Please consult [email or web address] for contact info under        Signed, Almyra Deforest, [place]  12/24/2019, 8:15 AM

## 2019-12-24 NOTE — TOC Transition Note (Signed)
Transition of Care [facility]) - CM/SW Discharge Note   Patient Details  Name: Haley [name] MRN: [id number] Date of Birth: 1966-08-09  [facility] CM/SW Contact:  Bethena Roys, RN Phone Number: 12/24/2019, 11:30 AM   Clinical Narrative: [employer] able to work with the patient regarding BIPAP and oxygen. Patient should be able to be serviced today. No further needs from Case Manager at this time.     Final next level of care: Home/Self Care Barriers to Discharge: No Barriers Identified   Patient Goals and CMS Choice Patient states their goals for this hospitalization and ongoing recovery are:: "to return home with BIPAP"    Discharge Plan and Services In-house Referral: NA Discharge Planning Services: CM Consult Post Acute Care Choice: Durable Medical Equipment          DME Arranged: Bipap, Oxygen DME Agency: [employer] Date DME Agency Contacted: 12/23/19 Time DME Agency Contacted: 1000 Representative spoke with at DME Agency: [place]: NA     Readmission Risk Interventions No flowsheet data found.

## 2019-12-25 ENCOUNTER — Telehealth: Payer: Self-pay

## 2019-12-25 NOTE — Telephone Encounter (Signed)
Transition Care Management Follow-up Telephone Call Date of discharge and from where:12/24/2019, [facility]   Call placed to the patient # [phone], message left with call back requested to this CM

## 2019-12-26 ENCOUNTER — Telehealth: Payer: Self-pay

## 2019-12-26 NOTE — Telephone Encounter (Signed)
Transition Care Management Follow-up Telephone Call Date of discharge and from where:12/24/2019, [facility].  Call placed to patient. # [phone], message left with call back requested to this CM .  Patient has an appt scheduled with Ms Raul Del 2/2/21021

## 2019-12-26 NOTE — Telephone Encounter (Signed)
Transition Care Management Follow-up Telephone Call  Date of discharge and from where: 12/24/2019, [facility]   How have you been since you were released from the hospital? She said she is feeling better,more rested. She denied any bleeding.   Any questions or concerns? She said she was told that "they"  would apply for medicaid for her.  Instructed her to call Financial Counseling at [facility] to inquire if an application was complete or if she needs to contact DSS and /or submit additional documentation.   Items Reviewed:  Did the pt receive and understand the discharge instructions provided? She has the instructions, no questions.  Upcoming appointments reviewed.   Medications obtained and verified? She said that she has the new medications - furosemide and methimazole and she stated the orders correctly.  She needs to pick up refills for apixaban and metoprolol.  She said she is taking her medications as ordered.  She did not have any questions. She also said that she has taken simply sleep at night.  Any new allergies since your discharge? None reported   Do you have support at home? Her husband  Other (ie: DME, Home Health, etc) no home health ordered.  Has glucometer. She said she checks it daily but had not checked yet today.   Does not have a scale  Has O2 though [employer]. She said that she has a room concentrator and large tanks. She said she has been using the O2 @2  L but not continuously   She explained that she was supposed to pick up the BiPAP at [facility]  today but overslept and has re-scheduled pick up for tomorrow at 1330.  She noted that her husband will need to pay out of pocket for the machine.   Functional Questionnaire: (I = Independent and D = Dependent) ADL's:independent   Follow up appointments reviewed:    PCP Hospital f/u appt confirmed? Appointment with Ms Raul Del, NP 2/2/21021 @ [facility] f/u appt confirmed? Cardiology - 01/16/2020.   Referral has been made to endocrinology.   Instructed her to call to schedule an appt if she has not heard from them in a couple of days.   Are transportation arrangements needed?she said that her husband will need to drive her.   If their condition worsens, is the pt aware to call  their PCP or go to the ED? yes  Was the patient provided with contact information for the PCP's office or ED?she has the phone number for the clinic  Was the pt encouraged to call back with questions or concerns? yes

## 2019-12-27 ENCOUNTER — Telehealth (HOSPITAL_COMMUNITY): Payer: Self-pay | Admitting: Licensed Clinical Social Worker

## 2019-12-27 NOTE — Telephone Encounter (Signed)
CSW called pt following hospital DC to discuss current lack of insurance.  Pt reports she has not had insurance in many years.  Currently working part time and no way to get insurance through her job- pt spouse also working but insurance would be cost prohibitive.  Pt reports it has been hard for her to continue working given medical problems and is interested applying for medicaid/disability.  Pt reports she applied for medicaid/disability before but it was 5 years ago now.  CSW provided pt with numbers for SSA and DHHS so she can call and make phone appts to apply.  CSW also discussed CAFA and mailed application to her.  Pt was on CAFA 4 years ago so is familiar with how it works.  CSW will continue to follow and assist as needed  Jorge Ny, [facility] Desk#: [phone] Cell#: [phone]

## 2020-01-02 ENCOUNTER — Telehealth (HOSPITAL_COMMUNITY): Payer: Self-pay | Admitting: Licensed Clinical Social Worker

## 2020-01-02 NOTE — Telephone Encounter (Signed)
CSW called pt to make sure she had received CAFA and had been able to call SSA and DHHS to set up phone appts to apply for benefits.  Unable to reach- left voicemail requesting return call  Jorge Ny, [facility] Desk#: [phone] Cell#: [phone]

## 2020-01-07 ENCOUNTER — Ambulatory Visit: Payer: Self-pay | Attending: Nurse Practitioner | Admitting: Nurse Practitioner

## 2020-01-07 ENCOUNTER — Encounter: Payer: Self-pay | Admitting: Nurse Practitioner

## 2020-01-07 ENCOUNTER — Other Ambulatory Visit: Payer: Self-pay

## 2020-01-07 ENCOUNTER — Telehealth (HOSPITAL_COMMUNITY): Payer: Self-pay | Admitting: Licensed Clinical Social Worker

## 2020-01-07 DIAGNOSIS — E1169 Type 2 diabetes mellitus with other specified complication: Secondary | ICD-10-CM

## 2020-01-07 DIAGNOSIS — Z09 Encounter for follow-up examination after completed treatment for conditions other than malignant neoplasm: Secondary | ICD-10-CM

## 2020-01-07 MED ORDER — TRULICITY 1.5 MG/0.5ML ~~LOC~~ SOAJ: 1.5000 mg | SUBCUTANEOUS | 2 refills | Status: DC

## 2020-01-07 MED ORDER — TRUE METRIX BLOOD GLUCOSE TEST VI STRP: ORAL_STRIP | 12 refills | Status: DC

## 2020-01-07 MED ORDER — BD PEN NEEDLE MINI U/F 31G X 5 MM MISC: 6 refills | Status: DC

## 2020-01-07 MED FILL — TRUE METRIX GLUCOSE TEST ST: 50 days supply | Qty: 100 | Fill #0

## 2020-01-07 MED FILL — $TRULICITY 1.5 MG/0.5 ML PE: 1.5 | 56 days supply | Qty: 4 | Fill #0

## 2020-01-07 NOTE — Progress Notes (Signed)
Virtual Visit via Telephone Note Due to national recommendations of social distancing due to [place] 19, telehealth visit is felt to be most appropriate for this patient at this time.  I discussed the limitations, risks, security and privacy concerns of performing an evaluation and management service by telephone and the availability of in person appointments. I also discussed with the patient that there may be a patient responsible charge related to this service. The patient expressed understanding and agreed to proceed.    I connected with Haley [name] on 01/07/20  at   9:30 AM EST  EDT by telephone and verified that I am speaking with the correct person using two identifiers.   Consent I discussed the limitations, risks, security and privacy concerns of performing an evaluation and management service by telephone and the availability of in person appointments. I also discussed with the patient that there may be a patient responsible charge related to this service. The patient expressed understanding and agreed to proceed.   Location of Patient: Private residence   Location of Provider: [place] [facility] [place] participating in Telemedicine visit: Haley Rankins FNP-BC [place]    History of Present Illness: Telemedicine visit for: HFU  has a past medical history of Essential hypertension, Fibroids, Hyperthyroidism, Morbid obesity ([place]), Paroxysmal atrial fibrillation ([place]), Snores, and Type II diabetes mellitus ([place]).  Admitted to the hospital for 3 days 12-21-2019 due to acute diastolic CHF with hypoxic resp failure due to untreated sleep apnea. Echo showed normal LVEF.  She was discharged home and instructed to continue eliquis twice a day for history of PAF 2/2 hyperthyroidism ( apparently she had only been taking this once a day), prn lasix, BB (metoprolol). Recommended follow up for BIPAP due to OSA. She was also started on  methimazole and referred to Endocrinology. She has a history of poor compliance in the past.   Today she reports feeling much better. Energy has increased. She has an appt with Cardiology on 01-16-2020. She is currently on 2L of O2 which she feels she needs throughout the day. .  Using her BIPAP faithfully at night. Diet is not healthy. Eats out a lot. Working on this.     Past Medical History:  Diagnosis Date  . Essential hypertension   . Fibroids   . Hyperthyroidism    a. 2010, treated w/ tapazole.  . Morbid obesity ([place])   . Paroxysmal atrial fibrillation ([place])    a. 03/2009 - in setting of hyperthyroidism and caffeine intake, converted spontaneously;  b. 08/2016 ED visit for recurrent PAF-->successful DCCV in ED.  [name] Snores   . Type II diabetes mellitus ([place])     Past Surgical History:  Procedure Laterality Date  . UTERINE FIBROID SURGERY      Family History  Problem Relation Age of Onset  . Hypertension Mother   . Diabetes Mellitus II Sister   . Cancer Maternal Grandmother     Social History   Socioeconomic History  . Marital status: Married    Spouse name: Not on file  . Number of children: Not on file  . Years of education: Not on file  . Highest education level: Not on file  Occupational History  . Occupation: cosmetologist  Tobacco Use  . Smoking status: Former Smoker    Packs/day: 1.00    Years: 32.00    Pack years: 32.00    Quit date: 2012    Years since quitting:  9.1  . Smokeless tobacco: Never Used  . Tobacco comment: patient vapes  Substance and Sexual Activity  . Alcohol use: No    Comment: occasional drink.  . Drug use: No  . Sexual activity: Yes  Other Topics Concern  . Not on file  Social History Narrative   Lives in [place] with husband.  Systems analyst.  Also in school @ [employer] for [place].  Does not routinely exercise.   Social Determinants of Health   Financial Resource Strain:   . Difficulty of Paying Living Expenses: Not on file   Food Insecurity:   . Worried About Charity fundraiser in the Last Year: Not on file  . Ran Out of Food in the Last Year: Not on file  Transportation Needs:   . Lack of Transportation (Medical): Not on file  . Lack of Transportation (Non-Medical): Not on file  Physical Activity:   . Days of Exercise per Week: Not on file  . Minutes of Exercise per Session: Not on file  Stress:   . Feeling of Stress : Not on file  Social Connections:   . Frequency of Communication with Friends and Family: Not on file  . Frequency of Social Gatherings with Friends and Family: Not on file  . Attends Religious Services: Not on file  . Active Member of Clubs or Organizations: Not on file  . Attends Archivist Meetings: Not on file  . Marital Status: Not on file     Observations/Objective: Awake, alert and oriented x 3   Review of Systems  Constitutional: Negative for fever, malaise/fatigue and weight loss.  HENT: Negative.  Negative for nosebleeds.   Eyes: Negative.  Negative for blurred vision, double vision and photophobia.  Respiratory: Negative.  Negative for cough and shortness of breath.   Cardiovascular: Negative.  Negative for chest pain, palpitations and leg swelling.  Gastrointestinal: Negative.  Negative for heartburn, nausea and vomiting.  Musculoskeletal: Negative.  Negative for myalgias.  Neurological: Negative.  Negative for dizziness, focal weakness, seizures and headaches.  Psychiatric/Behavioral: Negative.  Negative for suicidal ideas.    Assessment and Plan: [name] was seen today for hospitalization follow-up.  Diagnoses and all orders for this visit:  Hospital discharge follow-up  Type 2 diabetes mellitus with other specified complication, without long-term current use of insulin (HCC) -     Dulaglutide (TRULICITY) 1.5 0000000 SOPN; Inject 1.5 mg into the skin once a week. -     glucose blood (TRUE METRIX BLOOD GLUCOSE TEST) test strip; Use as instructed. Check  blood glucose level by fingerstick twice per day. -     Insulin Pen Needle (B-D UF III MINI PEN NEEDLES) 31G X 5 MM MISC; Use as instructed. Inject into the skin once nightly. Continue blood sugar control as discussed in office today, low carbohydrate diet, and regular physical exercise as tolerated, 150 minutes per week (30 min each day, 5 days per week, or 50 min 3 days per week). Keep blood sugar logs with fasting goal of 90-130 mg/dl, post prandial (after you eat) less than 180.  For Hypoglycemia: BS <60 and Hyperglycemia BS >400; contact the clinic ASAP. Annual eye exams and foot exams are recommended.  Lab Results  Component Value Date   HGBA1C 7.8 (H) 12/22/2019       Follow Up Instructions Return in about 12 weeks (around 03/31/2020).     I discussed the assessment and treatment plan with the patient. The patient was provided an opportunity  to ask questions and all were answered. The patient agreed with the plan and demonstrated an understanding of the instructions.   The patient was advised to call back or seek an in-person evaluation if the symptoms worsen or if the condition fails to improve as anticipated.  I provided 21 minutes of non-face-to-face time during this encounter including median intraservice time, reviewing previous notes, labs, imaging, medications and explaining diagnosis and management.  [name], FNP-BC

## 2020-01-07 NOTE — Telephone Encounter (Signed)
CSW called pt to check in regarding CAFA, Medicaid, and Disability.  Pt reports she has received the CAFA in the mail but hasn't worked on it yet- states she lost the numbers to [facility] and DHHS so CSW provided those again and encouraged her to reach out to them ASAP to start application process.  CSW will continue to follow and assist as needed  Jorge Ny, [facility] Desk#: [phone] Cell#: [phone]

## 2020-01-13 ENCOUNTER — Other Ambulatory Visit: Payer: Self-pay

## 2020-01-13 ENCOUNTER — Ambulatory Visit: Payer: Self-pay | Attending: Nurse Practitioner | Admitting: Pharmacist

## 2020-01-13 DIAGNOSIS — E1169 Type 2 diabetes mellitus with other specified complication: Secondary | ICD-10-CM

## 2020-01-13 NOTE — Progress Notes (Signed)
Patient was educated on the use of the MyPlate method. Reviewed recommended dietary restrictions for patients with DM. Also reviewed goal blood glucose levels. All questions and concerns were addressed.

## 2020-01-16 ENCOUNTER — Telehealth: Payer: Self-pay

## 2020-01-16 ENCOUNTER — Other Ambulatory Visit: Payer: Self-pay

## 2020-01-16 ENCOUNTER — Ambulatory Visit (INDEPENDENT_AMBULATORY_CARE_PROVIDER_SITE_OTHER): Payer: Self-pay | Admitting: Cardiovascular Disease

## 2020-01-16 ENCOUNTER — Encounter: Payer: Self-pay | Admitting: Cardiovascular Disease

## 2020-01-16 VITALS — BP 111/68 | HR 85 | Temp 97.3°F | Ht 67.0 in | Wt 290.2 lb

## 2020-01-16 DIAGNOSIS — Z7901 Long term (current) use of anticoagulants: Secondary | ICD-10-CM

## 2020-01-16 DIAGNOSIS — E0590 Thyrotoxicosis, unspecified without thyrotoxic crisis or storm: Secondary | ICD-10-CM

## 2020-01-16 DIAGNOSIS — I5031 Acute diastolic (congestive) heart failure: Secondary | ICD-10-CM

## 2020-01-16 DIAGNOSIS — I10 Essential (primary) hypertension: Secondary | ICD-10-CM

## 2020-01-16 DIAGNOSIS — G4733 Obstructive sleep apnea (adult) (pediatric): Secondary | ICD-10-CM

## 2020-01-16 DIAGNOSIS — I480 Paroxysmal atrial fibrillation: Secondary | ICD-10-CM

## 2020-01-16 MED FILL — ATORVASTATIN CALCIUM 20 MG: 20 | 30 days supply | Qty: 30 | Fill #4

## 2020-01-16 NOTE — Telephone Encounter (Signed)
Opened in error

## 2020-01-16 NOTE — Patient Instructions (Signed)
Medication Instructions:  CONTINUE WITH CURRENT MEDICATIONS. NO CHANGES. *If you need a refill on your cardiac medications before your next appointment, please call your pharmacy*  Follow-Up: At [facility], you and your health needs are our priority.  As part of our continuing mission to provide you with exceptional heart care, we have created designated Provider Care Teams.  These Care Teams include your primary Cardiologist (physician) and Advanced Practice Providers (APPs -  Physician Assistants and Nurse Practitioners) who all work together to provide you with the care you need, when you need it.  Your next appointment:   3-4 month(s)  The format for your next appointment:   In Person  Provider:   [name], MD

## 2020-01-16 NOTE — Progress Notes (Signed)
Cardiology Office Note    Date:  01/18/2020   ID:  Haley [name], DOB 1966-01-11, MRN [id number]  PCP:  [name], NP  Cardiologist:  [name], MD (sleep)  1-monthevaluation initial follow-up after she had received a BiPAP unit  History of Present Illness:  Haley [name] is a 54y.o. female who presents for a follow-up sleep  evaluation.  Haley [name] has a history of PAF, which initially occurred in the setting of hyperthyroidism in 2010.  She recently developed recurrent paroxysmal atrial fibrillation and underwent cardioversion in the ED in September 2017.  She has a history of type 2 diabetes mellitus, morbid obesity, hypertension, and she had recently seen CLeroy Seain November 2017.  At that time, she was started on beta blocker therapy.  She was started on eliquis anticoagulation.  Due to concerns for obstructive sleep apnea.  She was referred for a sleep study which was done on 12/13/2016.  She had severe sleep apnea with an AHI of 111.2 per hour, an RDI of 113.  AHI during REM sleep was 74.2.  She had severe oxygen desaturation to 51%.  She underwent a CPAP, and ultimate BiPAP titration on 01/04/2017.  On her titration study CPAP was advanced to 19 cm water pressure but due to continued events, BiPAP was implemented and she required maximum titration up to 25/21.  At 23/19, AHI was still elevated 6.4, but 25/21 at AHI was 0.  Apparently, the patient had concerns about cost since she currently does not have insurance.  She was wondering about using a CPAP from a family member since he no longer uses treatment.  I was concerned that CPAP may not be as beneficial.  When I saw her in July 2018, she  brought the machine with her to the office today for our review and assessment.  She was given a ResMed S9 and had a  full face mas given to her the time of first titration study.  At that time she was going  to bed around 1 AM and waking up for good around noon.   She had frequent awakenings during the night and daytime sleepiness.  Epworth Sleepiness Scale: Situation   Chance of Dozing/Sleeping (0 = never , 1 = slight chance , 2 = moderate chance , 3 = high chance )   sitting and reading 2   watching TV 2   sitting inactive in a public place 2   being a passenger in a motor vehicle for an hour or more 1   lying down in the afternoon 2   sitting and talking to someone 1   sitting quietly after lunch (no alcohol) 2   while stopped for a few minutes in traffic as the driver 2   Total Score  14   When I saw her, I recommended that her CPAP unit  adjusted to it 20 cm maximum pressure.  We had set up the process through assisted support for her to try to get a BiPAP machine.  She did reach out to [facility] since this was the DME company that had supplied her friend with the CPAP unit that she was using.  She has felt improved since using CPAP therapy.  However, she still admits to fatigue.  She feels  that in the middle of night she was not getting enough air and for this reason , was taking her mask off when she felt the sensation.  When I saw her in March 2020 he had not received a BiPAP machine she did not bring the CPAP of her friend who she was using for interrogation.  When I last saw her in August 2020, she was still having issues with sleep and admitted to no energy, poor sleep, snoring, and nocturia at least 3 times per night.  She is now working doing senior sitting.  She still does not have insurance.  She is unaware of any recurrent or fibrillation and continued to be on Eliquis for anticoagulation in addition to metoprolol succinate 50 mg in the morning and 25 mg in the evening.  She is diabetic on Trulicity in addition to metformin and glipizide.  She is on atorvastatin 20 mg daily for hyperlipidemia.  Since I last saw her, she was recently admitted to hospital in January 2021 with acute hypoxic respiratory failure secondary to acute  diastolic CHF contributed by untreated sleep apnea.  CTA was negative for PE although she had small airway disease.  Lower extremity Dopplers were negative for DVT.  An echo Doppler study revealed an EF of 60 to 65%, moderate LVH without wall motion abnormality.  She was treated with a 6.  She was maintaining sinus rhythm on beta-blocker and Eliquis.  Prior to her hospitalization she not using her CPAP therapy.  Care management was consulted instantly with help from the heart and vascular patient find she ultimately was able to receive a new ResMed air curve BiPAP unit with set up date December 27, 2019.  While I saw her in the office I was unable to obtain data since she had not been linked to our office.  However subsequently we were able to get her BiPAP unit linked  to our office.  Since her set up date of December 27, 2019 she has used it all days with the exception of 1.  Her EPAP pressure is set at 10 with IPAP pressure of 18.  AHI is 4.4 with a central index of 1.4.  She has felt significantly improved since reinitiating therapy and feels significantly better with compared to her prior CPAP.  She is unaware of any breakthrough snoring.  She is also having oxygen supplementation through her BiPAP unit.  She is try to make dietary adjustments with her morbid obesity.  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  Past Medical History:  Diagnosis Date  . Essential hypertension   . Fibroids   . Hyperthyroidism    a. 2010, treated w/ tapazole.  . Morbid obesity ([place])   . Paroxysmal atrial fibrillation ([place])    a. 03/2009 - in setting of hyperthyroidism and caffeine intake, converted spontaneously;  b. 08/2016 ED visit for recurrent PAF-->successful DCCV in ED.  [name] Snores   . Type II diabetes mellitus ([place])     Past Surgical History:  Procedure Laterality Date  . UTERINE FIBROID SURGERY      Current Medications: Outpatient Medications Prior to Visit  Medication Sig  Dispense Refill  . acetaminophen (TYLENOL 8 HOUR ARTHRITIS PAIN) 650 MG CR tablet Take 650 mg by mouth every 8 (eight) hours as needed for pain.    [name] apixaban (ELIQUIS) 5 MG TABS tablet TAKE 1 TABLET (5 MG TOTAL) BY MOUTH 2 (TWO) TIMES DAILY. 60 tablet 3  . atorvastatin (LIPITOR) 20 MG tablet Take 1 tablet (20 mg total) by mouth daily. 90 tablet 3  . Blood Glucose Monitoring Suppl (TRUE METRIX METER) w/Device KIT  Use twice daily 1 kit 0  . diclofenac sodium (VOLTAREN) 1 % GEL Apply 4 g topically 4 (four) times daily as needed. 100 g 3  . Dulaglutide (TRULICITY) 1.5 BJ/4.7WG SOPN Inject 1.5 mg into the skin once a week. 2 mL 2  . furosemide (LASIX) 20 MG tablet Take 1 tablet (20 mg total) by mouth as needed for edema. 30 tablet 2  . glipiZIDE (GLUCOTROL) 10 MG tablet TAKE 1 TABLET (10 MG TOTAL) BY MOUTH 2 (TWO) TIMES DAILY BEFORE A MEAL. (Patient taking differently: Take 10 mg by mouth daily. ) 60 tablet 2  . glucose blood (TRUE METRIX BLOOD GLUCOSE TEST) test strip Use as instructed. Check blood glucose level by fingerstick twice per day. 200 each 12  . Insulin Pen Needle (B-D UF III MINI PEN NEEDLES) 31G X 5 MM MISC Use as instructed. Inject into the skin once nightly. 100 each 6  . metFORMIN (GLUCOPHAGE-XR) 500 MG 24 hr tablet Take 4 tablets (2,000 mg total) by mouth daily. (Patient taking differently: Take 2,000 mg by mouth at bedtime. ) 120 tablet 3  . methimazole (TAPAZOLE) 5 MG tablet Take 1 tablet (5 mg total) by mouth 3 (three) times daily. 90 tablet 2  . Methylsulfonylmethane (MSM PO) Take 2 tablets by mouth daily.    . metoprolol succinate (TOPROL-XL) 50 MG 24 hr tablet Take 1 tablet (50 mg total) by mouth daily. 30 tablet 3  . Multiple Vitamins-Minerals (ALIVE WOMENS 50+) TABS Take 1 tablet by mouth daily.    . TRUEplus Lancets 28G MISC Use twice daily for blood glucose check 100 each 1   No facility-administered medications prior to visit.     Allergies:   Patient has no known  allergies.   Social History   Socioeconomic History  . Marital status: Married    Spouse name: Not on file  . Number of children: Not on file  . Years of education: Not on file  . Highest education level: Not on file  Occupational History  . Occupation: cosmetologist  Tobacco Use  . Smoking status: Former Smoker    Packs/day: 1.00    Years: 32.00    Pack years: 32.00    Quit date: 2012    Years since quitting: 9.1  . Smokeless tobacco: Never Used  . Tobacco comment: patient vapes  Substance and Sexual Activity  . Alcohol use: No    Comment: occasional drink.  . Drug use: No  . Sexual activity: Yes  Other Topics Concern  . Not on file  Social History Narrative   Lives in [place] with husband.  Systems analyst.  Also in school @ [employer] for [employer].  Does not routinely exercise.   Social Determinants of Health   Financial Resource Strain:   . Difficulty of Paying Living Expenses: Not on file  Food Insecurity:   . Worried About Charity fundraiser in the Last Year: Not on file  . Ran Out of Food in the Last Year: Not on file  Transportation Needs:   . Lack of Transportation (Medical): Not on file  . Lack of Transportation (Non-Medical): Not on file  Physical Activity:   . Days of Exercise per Week: Not on file  . Minutes of Exercise per Session: Not on file  Stress:   . Feeling of Stress : Not on file  Social Connections:   . Frequency of Communication with Friends and Family: Not on file  . Frequency of Social Gatherings with Friends  and Family: Not on file  . Attends Religious Services: Not on file  . Active Member of Clubs or Organizations: Not on file  . Attends Archivist Meetings: Not on file  . Marital Status: Not on file     Family History:  The patient's family history includes Cancer in her maternal grandmother; Diabetes Mellitus II in her sister; Hypertension in her mother.  Family history is notable in that her uncle  had sleep apnea  but has not utilized treatment and he gave her his machine.  ROS General: Negative; No fevers, chills, or night sweats; morbid obesity HEENT: Negative; No changes in vision or hearing, sinus congestion, difficulty swallowing Pulmonary: Negative; No cough, wheezing, shortness of breath, hemoptysis Cardiovascular: History of PAF GI: Negative; No nausea, vomiting, diarrhea, or abdominal pain GU: Negative; No dysuria, hematuria, or difficulty voiding Musculoskeletal: Negative; no myalgias, joint pain, or weakness Hematologic/Oncology: Negative; no easy bruising, bleeding Endocrine: Positive for diabetes mellitus Neuro: Negative; no changes in balance, headaches Skin: Negative; No rashes or skin lesions Psychiatric: Negative; No behavioral problems, depression Sleep: Positive for severe sleep apnea; snoring, daytime sleepiness, hypersomnolence;  No bruxism, restless legs, hypnogognic hallucinations, no cataplexy Other comprehensive 14 point system review is negative.   PHYSICAL EXAM:   VS:  BP 111/68   Pulse 85   Temp (!) 97.3 F (36.3 C)   Ht '5\' 7"'$  (1.702 m)   Wt 290 lb 3.2 oz (131.6 kg)   SpO2 93%   BMI 45.45 kg/m     Repeat blood pressure by me 120/70  Wt Readings from Last 3 Encounters:  01/16/20 290 lb 3.2 oz (131.6 kg)  12/23/19 (!) 300 lb 4.8 oz (136.2 kg)  11/14/19 295 lb 3.2 oz (133.9 kg)    General: Alert, oriented, no distress.  Skin: normal turgor, no rashes, warm and dry HEENT: Normocephalic, atraumatic. Pupils equal round and reactive to light; sclera anicteric; extraocular muscles intact;  Nose without nasal septal hypertrophy Mouth/Parynx benign; Mallinpatti scale 4 Neck: No JVD, no carotid bruits; normal carotid upstroke Lungs: clear to ausculatation and percussion; no wheezing or rales Chest wall: without tenderness to palpitation Heart: PMI not displaced, RRR, s1 s2 normal, 1/6 systolic murmur, no diastolic murmur, no rubs, gallops, thrills, or  heaves Abdomen: Significant central adiposity: Soft, nontender; no hepatosplenomehaly, BS+; abdominal aorta nontender and not dilated by palpation. Back: no CVA tenderness Pulses 2+ Musculoskeletal: full range of motion, normal strength, no joint deformities Extremities: no clubbing cyanosis or edema, Homan's sign negative  Neurologic: grossly nonfocal; Cranial nerves grossly wnl Psychologic: Normal mood and affect   Studies/Labs Reviewed:   I personally reviewed most recent ECG from December 22, 2019 which showed sinus rhythm at 69 bpm.  There were nonspecific ST changes.  August 05, 2019 ECG (independently read by me): Normal sinus rhythm at 83 bpm.  Probable left atrial enlargement.  QTc interval 432 ms PR interval 146 ms  ECG (independently read by me): Normal sinus rhythm at 84 bpm.  Normal intervals.  No ectopy  October 2018 ECG (independently read by me): Normal sinus rhythm at 73 bpm.  No ectopy.  Normal intervals.  Recent Labs: BMP Latest Ref Rng & Units 12/24/2019 12/23/2019 12/22/2019  Glucose 70 - 99 mg/dL 184(H) 158(H) 264(H)  BUN 6 - 20 mg/dL '12 10 10  '$ Creatinine 0.44 - 1.00 mg/dL 0.51 0.47 0.51  BUN/Creat Ratio 9 - 23 - - -  Sodium 135 - 145 mmol/L 140 142 140  Potassium 3.5 - 5.1 mmol/L 4.1 4.2 3.7  Chloride 98 - 111 mmol/L 99 99 98  CO2 22 - 32 mmol/L 32 34(H) 32  Calcium 8.9 - 10.3 mg/dL 8.8(L) 9.0 8.9     Hepatic Function Latest Ref Rng & Units 02/19/2019 03/21/2018 05/04/2009  Total Protein 6.0 - 8.5 g/dL 7.3 8.1 7.5  Albumin 3.8 - 4.9 g/dL 4.5 4.3 4.1  AST 0 - 40 IU/L 14 23 38(H)  ALT 0 - 32 IU/L '13 20 19  '$ Alk Phosphatase 39 - 117 IU/L 58 53 45  Total Bilirubin 0.0 - 1.2 mg/dL 0.3 0.6 0.7    CBC Latest Ref Rng & Units 12/21/2019 02/19/2019 01/25/2019  WBC 4.0 - 10.5 K/uL 9.9 7.7 8.0  Hemoglobin 12.0 - 15.0 g/dL 13.7 14.1 13.3  Hematocrit 36.0 - 46.0 % 45.6 42.6 43.5  Platelets 150 - 400 K/uL 246 244 244   Lab Results  Component Value Date   MCV 95.6  12/21/2019   MCV 92 02/19/2019   MCV 94.4 01/25/2019   Lab Results  Component Value Date   TSH 0.010 (L) 12/21/2019   Lab Results  Component Value Date   HGBA1C 7.8 (H) 12/22/2019     BNP    Component Value Date/Time   BNP 205.8 (H) 12/21/2019 1427    ProBNP No results found for: PROBNP   Lipid Panel     Component Value Date/Time   CHOL 153 09/06/2019 1703   TRIG 116 09/06/2019 1703   HDL 50 09/06/2019 1703   CHOLHDL 3.1 09/06/2019 1703   CHOLHDL 3.5 01/25/2019 0850   VLDL 19 01/25/2019 0850   LDLCALC 82 09/06/2019 1703     RADIOLOGY: DG Chest 2 View  Result Date: 12/21/2019 CLINICAL DATA:  Chest pain, shortness of breath for 1 week, history of AFib EXAM: CHEST - 2 VIEW COMPARISON:  CT 01/24/2019, radiograph 01/24/2019 FINDINGS: Mixed hazy interstitial and airspace opacities in the lungs with some fissural thickening. There is indistinctness of pulmonary vascularity and enlargement of the cardiac silhouette when compared to prior exams. No pneumothorax or visible effusion is seen. No acute osseous or soft tissue abnormality. Degenerative changes are present in the imaged spine and shoulders. IMPRESSION: Appearance favors congestive heart failure with increased size of the cardiac silhouette likely reflecting cardiomegaly and features of interstitial and alveolar edema. Electronically Signed   By: Lovena Le M.D.   On: 12/21/2019 15:13   CT ANGIO CHEST PE W OR WO CONTRAST  Result Date: 12/22/2019 CLINICAL DATA:  Shortness of breath, elevated D-dimer level. EXAM: CT ANGIOGRAPHY CHEST WITH CONTRAST TECHNIQUE: Multidetector CT imaging of the chest was performed using the standard protocol during bolus administration of intravenous contrast. Multiplanar CT image reconstructions and MIPs were obtained to evaluate the vascular anatomy. CONTRAST:  159m OMNIPAQUE IOHEXOL 350 MG/ML SOLN COMPARISON:  January 24, 2019. FINDINGS: Cardiovascular: Satisfactory opacification of the  pulmonary arteries to the segmental level. No evidence of pulmonary embolism. Mild cardiomegaly is noted. No pericardial effusion. Mediastinum/Nodes: Stable large thyroid goiter is noted extending into retrosternal space. No adenopathy is noted. Esophagus is unremarkable. Lungs/Pleura: No pneumothorax or pleural effusion is noted. Mosaic pattern is noted throughout both lungs which may represent small airway disease or possibly multifocal inflammation. Upper Abdomen: No acute abnormality. Musculoskeletal: No chest wall abnormality. No acute or significant osseous findings. Review of the MIP images confirms the above findings. IMPRESSION: 1. No definite evidence of pulmonary embolus. 2. Stable large thyroid goiter extending into retrosternal space.  3. Mosaic pattern is noted throughout both lungs which may represent small airway disease or possibly multifocal inflammation. Electronically Signed   By: Marijo Conception M.D.   On: 12/22/2019 11:32   ECHOCARDIOGRAM COMPLETE BUBBLE STUDY  Result Date: 12/22/2019   ECHOCARDIOGRAM REPORT   Patient Name:   Haley [name] Date of Exam: 12/22/2019 Medical Rec #:  [id number]           Height:       67.0 in Accession #:    [id number]          Weight:       295.2 lb Date of Birth:  1966-06-24           BSA:          2.39 m Patient Age:    58 years            BP:           122/55 mmHg Patient Gender: F                   HR:           80 bpm. Exam Location:  Inpatient Procedure: 2D Echo, Color Doppler, Limited Color Doppler and Saline Contrast            Bubble Study Indications:    PFO  History:        Patient has prior history of Echocardiogram examinations, most                 recent 01/02/2019. CHF, Abnormal ECG, Arrythmias:Atrial                 Fibrillation, Signs/Symptoms:Chest Pain and Dyspnea; Risk                 Factors:Hypertension, Diabetes, Dyslipidemia and Sleep Apnea.                 Hypoxia.  Sonographer:    Radene Gunning Referring Phys: [id number] Milus Banister   Sonographer Comments: Technically difficult study due to poor echo windows and patient is morbidly obese. Image acquisition challenging due to patient body habitus. IMPRESSIONS  1. Left ventricular ejection fraction, by visual estimation, is 60 to 65%. The left ventricle has normal function. There is moderately increased left ventricular hypertrophy.  2. The left ventricle has no regional wall motion abnormalities.  3. Global right ventricle has normal systolic function.The right ventricular size is normal. No increase in right ventricular wall thickness.  4. Left atrial size was mildly dilated.  5. Right atrial size was normal.  6. The mitral valve is normal in structure. Trivial mitral valve regurgitation. No evidence of mitral stenosis.  7. The tricuspid valve is normal in structure.  8. The aortic valve was not well visualized. Aortic valve regurgitation is not visualized. Mild to moderate aortic valve sclerosis/calcification without any evidence of aortic stenosis.  9. The pulmonic valve was normal in structure. Pulmonic valve regurgitation is not visualized. 10. The inferior vena cava is normal in size with greater than 50% respiratory variability, suggesting right atrial pressure of 3 mmHg. 11. The interatrial septum was not well visualized. FINDINGS  Left Ventricle: Left ventricular ejection fraction, by visual estimation, is 60 to 65%. The left ventricle has normal function. The left ventricle has no regional wall motion abnormalities. There is moderately increased left ventricular hypertrophy. Normal left atrial pressure. Right Ventricle: The right ventricular size is normal. No increase in right ventricular wall thickness.  Global RV systolic function is has normal systolic function. Left Atrium: Left atrial size was mildly dilated. Right Atrium: Right atrial size was normal in size Pericardium: There is no evidence of pericardial effusion. Mitral Valve: The mitral valve is normal in structure. There is  mild thickening of the mitral valve leaflet(s). There is mild calcification of the mitral valve leaflet(s). Trivial mitral valve regurgitation. No evidence of mitral valve stenosis by observation. Tricuspid Valve: The tricuspid valve is normal in structure. Tricuspid valve regurgitation is trivial. Aortic Valve: The aortic valve was not well visualized. Aortic valve regurgitation is not visualized. Mild to moderate aortic valve sclerosis/calcification is present, without any evidence of aortic stenosis. Pulmonic Valve: The pulmonic valve was normal in structure. Pulmonic valve regurgitation is not visualized. Pulmonic regurgitation is not visualized. Aorta: The aortic root, ascending aorta and aortic arch are all structurally normal, with no evidence of dilitation or obstruction. Venous: The inferior vena cava is normal in size with greater than 50% respiratory variability, suggesting right atrial pressure of 3 mmHg. IAS/Shunts: The interatrial septum was not well visualized. Agitated saline contrast was given intravenously to evaluate for intracardiac shunting. There is no evidence of a patent foramen ovale. No ventricular septal defect is seen or detected. There is  no evidence of an atrial septal defect.  LEFT VENTRICLE PLAX 2D LVIDd:         4.55 cm       Diastology LVIDs:         2.30 cm       LV e' lateral:   10.90 cm/s LV PW:         1.50 cm       LV E/e' lateral: 12.0 LV IVS:        1.45 cm       LV e' medial:    8.16 cm/s LVOT diam:     1.80 cm       LV E/e' medial:  16.1 LV SV:         77 ml LV SV Index:   29.65 LVOT Area:     2.54 cm  LV Volumes (MOD) LV area d, A2C:    29.90 cm LV area d, A4C:    28.20 cm LV area s, A2C:    15.70 cm LV area s, A4C:    13.80 cm LV major d, A2C:   7.49 cm LV major d, A4C:   7.49 cm LV major s, A2C:   6.31 cm LV major s, A4C:   6.09 cm LV vol d, MOD A2C: 98.2 ml LV vol d, MOD A4C: 86.1 ml LV vol s, MOD A2C: 33.5 ml LV vol s, MOD A4C: 26.5 ml LV SV MOD A2C:     64.7 ml  LV SV MOD A4C:     86.1 ml LV SV MOD BP:      61.6 ml RIGHT VENTRICLE             IVC RV S prime:     11.10 cm/s  IVC diam: 2.70 cm TAPSE (M-mode): 2.7 cm LEFT ATRIUM             Index       RIGHT ATRIUM           Index LA diam:        3.90 cm 1.63 cm/m  RA Area:     15.60 cm LA Vol (A2C):   45.2 ml 18.94 ml/m RA Volume:   46.00 ml  19.28 ml/m LA Vol (A4C):   53.0 ml 22.21 ml/m LA Biplane Vol: 52.4 ml 21.96 ml/m  AORTIC VALVE LVOT Vmax:   163.00 cm/s LVOT Vmean:  107.000 cm/s LVOT VTI:    0.321 m  AORTA Ao Root diam: 3.30 cm MITRAL VALVE MV Area (PHT): 3.42 cm              SHUNTS MV PHT:        64.38 msec            Systemic VTI:  0.32 m MV Decel Time: 222 msec              Systemic Diam: 1.80 cm MV E velocity: 131.00 cm/s 103 cm/s MV A velocity: 94.60 cm/s  70.3 cm/s MV E/A ratio:  1.38        1.5  Jenkins Rouge MD Electronically signed by Jenkins Rouge MD Signature Date/Time: 12/22/2019/2:59:00 PM    Final    VAS [place] LOWER EXTREMITY VENOUS (DVT)  Result Date: 12/22/2019  Lower Venous Study Indications: Edema, and SOB.  Limitations: Body habitus. Comparison Study: Prior left lower extremity venous duplex from 01/01/19 is                   available for comparison Performing Technologist: Sharion Dove RVS  Examination Guidelines: A complete evaluation includes B-mode imaging, spectral Doppler, color Doppler, and power Doppler as needed of all accessible portions of each vessel. Bilateral testing is considered an integral part of a complete examination. Limited examinations for reoccurring indications may be performed as noted.  +---------+---------------+---------+-----------+----------+--------------+ RIGHT    CompressibilityPhasicitySpontaneityPropertiesThrombus Aging +---------+---------------+---------+-----------+----------+--------------+ CFV      Full                                         pulsatile      +---------+---------------+---------+-----------+----------+--------------+ SFJ       Full                                                        +---------+---------------+---------+-----------+----------+--------------+ FV Prox  Full                                                        +---------+---------------+---------+-----------+----------+--------------+ FV Mid   Full                                                        +---------+---------------+---------+-----------+----------+--------------+ FV DistalFull                                                        +---------+---------------+---------+-----------+----------+--------------+ PFV      Full                                                        +---------+---------------+---------+-----------+----------+--------------+  POP      Full           Yes      Yes                                 +---------+---------------+---------+-----------+----------+--------------+ PTV      Full                                                        +---------+---------------+---------+-----------+----------+--------------+ PERO     Full                                                        +---------+---------------+---------+-----------+----------+--------------+   +---------+---------------+---------+-----------+----------+--------------+ LEFT     CompressibilityPhasicitySpontaneityPropertiesThrombus Aging +---------+---------------+---------+-----------+----------+--------------+ CFV      Full                                         pulsatile      +---------+---------------+---------+-----------+----------+--------------+ SFJ      Full                                                        +---------+---------------+---------+-----------+----------+--------------+ FV Prox  Full                                                        +---------+---------------+---------+-----------+----------+--------------+ FV Mid   Full                                                         +---------+---------------+---------+-----------+----------+--------------+ FV DistalFull                                                        +---------+---------------+---------+-----------+----------+--------------+ PFV      Full                                                        +---------+---------------+---------+-----------+----------+--------------+ POP      Full           Yes      Yes                                 +---------+---------------+---------+-----------+----------+--------------+  PTV      Full                                                        +---------+---------------+---------+-----------+----------+--------------+ PERO     Full                                                        +---------+---------------+---------+-----------+----------+--------------+     Summary: Right: There is no evidence of deep vein thrombosis in the lower extremity. Left: There is no evidence of deep vein thrombosis in the lower extremity.  *See table(s) above for measurements and observations. Electronically signed by Monica Martinez MD on 12/22/2019 at 4:01:06 PM.    Final      Additional studies/ records that were reviewed today include:  I have reviewed the patient's prior ER evaluation for cardioversion, A. fib clinic note, and evaluation by Ignacia Bayley, NP  I have reviewed her recent hospitalization from 1016 through December 23, 2019.   ASSESSMENT:    1. Essential hypertension   2. OSA (obstructive sleep apnea)   3. Acute diastolic (congestive) heart failure ([place])   4. Morbid obesity (HCC)   5. Paroxysmal atrial fibrillation ([place])   6. Current use of long term anticoagulation   7. Hyperthyroidism     PLAN:  Haley [name] is a 54 year old female who history of morbid obesity, hypertension, paroxysmal atrial fibrillation, type 2 diabetes mellitus, and was diagnosed as having very severe sleep apnea.  She failed CPAP  therapy and required maximum BiPAP therapy at 25/21 for optimal treatment.  She never instituted BiPAP therapy due to lack of insurance.  When I last saw her she was borrowing a friend's CPAP unit.  At that time she still had issues and was remaining sleepy felt as though she was not getting adequate air particularly in the middle of the night.  Since I last saw her, she was admitted to the hospital in January 2021 with hypoxic respiratory failure felt secondary to acute diastolic heart failure.  She had not been using her CPAP and clearly was affected by her untreated sleep apnea.  During that evaluation she was also started on methimazole hyperthyroidism with TSH at 0.010.  Fortunately, she was evaluated by care manager and through the generosity of the heart and vascular found as an outpatient she was able to receive a new ResMed air curve and BiPAP unit.  Her set up date was December 27, 2019.  Presently, she is meeting compliance with reference to usage and has only had 1 day without usage.  He is averaging 5000 minutes of use.  She is set at a 18/10 pressure.  Her most recent download reveals an AHI of 4.4 and central apnea of 1.4.  When I saw him in the office I did not have have data regarding her therapy.  The following day I was able to have her BiPAP unit linked to our office interrogate her data.  Presently, I am recommending aging of her EPAP pressure and instead of starting at 4 she will start at 6.  She will continue with a 10-minute ramp time.  I am slightly  increasing her IPAP pressure and pressure support such that her pressure will be 19/10.  I will obtain a new download in 4 weeks.  I discussed the importance of weight loss.  She is morbidly obese with a BMI of 45.45.  She has been trying hard to have dietary adjustment.  She notes significant benefit since initiating BiPAP.  She is much more rested.  She is unaware of breakthrough snoring.  She is not waking up as frequently in her nocturia has  significantly improved.  Her blood pressure today is stable at 111/78 on metoprolol succinate 50 mg and she has been taking as needed furosemide.  She is on atorvastatin for hyperlipidemia with target LDL less than 70 in this diabetic female.  She is diabetic on Trulicity, Metformin, and glipizide.  She is tolerating methimazole initiation for hypertension.  I will see her in follow-up in 3 to 4 months.  I have given her clearance to return to work.  [name], MD

## 2020-01-18 ENCOUNTER — Encounter: Payer: Self-pay | Admitting: Cardiovascular Disease

## 2020-01-23 ENCOUNTER — Telehealth: Payer: Self-pay

## 2020-01-23 ENCOUNTER — Telehealth: Payer: Self-pay | Admitting: *Deleted

## 2020-01-23 ENCOUNTER — Telehealth: Payer: Self-pay | Admitting: Cardiovascular Disease

## 2020-01-23 NOTE — Telephone Encounter (Signed)
Left message per Dr Claiborne Billings to continue to use the oxygen at night  with her BIPAP.

## 2020-01-23 NOTE — Telephone Encounter (Signed)
Returned a call to patient. She wanted to know if she should be using her oxygen with her CPAP. I told her I would have to defer the call to Dr Claiborne Billings and call her back. Message sent to Dr Claiborne Billings for review and recommendations.

## 2020-01-23 NOTE — Telephone Encounter (Signed)
Telephoned patient at home. Left voice to call and schedule appointment with [facility].

## 2020-01-23 NOTE — Telephone Encounter (Signed)
New message    Pt is calling to speak with Mariann Laster   Please call back

## 2020-01-23 NOTE — Progress Notes (Signed)
Patient ID: Haley [name], female   DOB: Nov 28, 1966, 54 y.o.   MRN: [id number]   This visit occurred during the SARS-CoV-2 public health emergency.  Safety protocols were in place, including screening questions prior to the visit, additional usage of staff PPE, and extensive cleaning of exam room while observing appropriate contact time as indicated for disinfecting solutions.   HPI  Haley [name] is a 53 y.o.-year-old female, referred by her PCP, Leanor Kail, PA, for evaluation and management of thyrotoxicosis.  Patient has a history of thyrotoxicosis since 2010. At that time she was hospitalized with pericarditis and Acute heart failure.   After diagnosis, she was started on a medication (? MMI) >> she could not tolerate 2/2 extreme fatigue >> changed to another medication (? PTU).  Tests normalized afterwards and she was taken off the medication.    She recently started to have the same symptoms again: Weakness, shortness of breath. A TSH was checked and it returned low last month.  Free T4 was high.   She was started on: - Methimazole 5 mg 3 times a day she is tolerating this well - Toprol-XL 50 mg daily + 25 mg at night  She now feels better on the above medications.  I reviewed pt's thyroid tests: Lab Results  Component Value Date   TSH 0.010 (L) 12/21/2019   TSH 1.624 01/24/2019   TSH 1.015 01/02/2019   TSH 1.47 10/11/2016   TSH 0.007 (L) 03/14/2009   FREET4 1.33 (H) 12/22/2019   No components found for: FREET3   Antithyroid antibodies: No results found for: TSI   Reviewing her chart, she also has a large goiter with substernal extension.  Thyroid ultrasound (05/08/2019): Enlarged thyroid, no nodules. Inferior margin of the left thyroid lobe is difficult to visualize due to the substernal extension.  CXR (12/21/2019): Per review of the images, trachea is slightly deviated to the right  CT chest (12/22/2019): Stable, large goiter, extending into the  substernal space  Pt denies: - feeling nodules in neck - hoarseness - dysphagia - choking - SOB with lying down  She does mention: - weight gain, however, she does have weight loss per review of the chart, approximately 7 pounds net in the last month - Heat intolerance-chronic - Poor sleep - Shortness of breath - Leg swelling - Hair loss  She denies: - fatigue - tremors - anxiety - palpitations - hyperdefecation  Pt does have a FH of thyroid ds.: M aunt and M cousin. No FH of thyroid cancer. No h/o radiation tx to head or neck.  No seaweed or kelp.  She had a recent CT scan with contrast, however, this was done the day after she had thyroid tests checked. No steroid use. No herbal supplements. No Biotin use.  She has not been on amiodarone before.  Pt. also has a pertinent history of CHF, and also has severe OSA - just started a CPAP 12/2019 >> feels better.  She started exercising: Stationary bike, walking, chair aerobics, weightlifting.  ROS: Constitutional: + see HPI Eyes: no blurry vision, no xerophthalmia ENT: + Sore throat, + see HPI Cardiovascular: no CP/+ SOB/no palpitations/+ leg swelling (wears compression hoses) Respiratory: no cough/+ SOB Gastrointestinal: no N/V/D/C Musculoskeletal: no muscle/+ joint aches Skin: no rashes, + hair loss Neurological: no tremors/numbness/tingling/dizziness Psychiatric: no depression/anxiety + Low libido  Past Medical History:  Diagnosis Date  . Essential hypertension   . Fibroids   . Hyperthyroidism    a. 2010, treated w/  tapazole.  . Morbid obesity ([place])   . Paroxysmal atrial fibrillation ([place])    a. 03/2009 - in setting of hyperthyroidism and caffeine intake, converted spontaneously;  b. 08/2016 ED visit for recurrent PAF-->successful DCCV in ED.  [name] Snores   . Type II diabetes mellitus ([place])    Past Surgical History:  Procedure Laterality Date  . UTERINE FIBROID SURGERY     Social History   Socioeconomic  History  . Marital status: Married    Spouse name: Not on file  . Number of children: 0  . Years of education: Not on file  . Highest education level: Not on file  Occupational History  . Occupation:  Charity fundraiser, Haematologist  Tobacco Use  . Smoking status: Former Smoker, quit in 2018    Packs/day: 1.00    Years: 32.00    Pack years: 32.00    Quit date: 2012    Years since quitting: 9.1  . Smokeless tobacco: Never Used  . Tobacco comment: patient vapes  Substance and Sexual Activity  . Alcohol use: No    Comment: Wine, 2-3 glasses, every 3 weeks  . Drug use: No  Social History Narrative   Lives in [place] with husband.  Systems analyst.  Also in school @ [employer] for [employer].     Social Determinants of Health   Financial Resource Strain:   . Difficulty of Paying Living Expenses: Not on file  Food Insecurity:   . Worried About Charity fundraiser in the Last Year: Not on file  . Ran Out of Food in the Last Year: Not on file  Transportation Needs:   . Lack of Transportation (Medical): Not on file  . Lack of Transportation (Non-Medical): Not on file  Physical Activity:   . Days of Exercise per Week: Not on file  . Minutes of Exercise per Session: Not on file  Stress:   . Feeling of Stress : Not on file  Social Connections:   . Frequency of Communication with Friends and Family: Not on file  . Frequency of Social Gatherings with Friends and Family: Not on file  . Attends Religious Services: Not on file  . Active Member of Clubs or Organizations: Not on file  . Attends Archivist Meetings: Not on file  . Marital Status: Not on file  Intimate Partner Violence:   . Fear of Current or Ex-Partner: Not on file  . Emotionally Abused: Not on file  . Physically Abused: Not on file  . Sexually Abused: Not on file   Current Outpatient Medications on File Prior to Visit  Medication Sig Dispense Refill  . acetaminophen (TYLENOL 8 HOUR ARTHRITIS PAIN) 650 MG CR  tablet Take 650 mg by mouth every 8 (eight) hours as needed for pain.    [name] apixaban (ELIQUIS) 5 MG TABS tablet TAKE 1 TABLET (5 MG TOTAL) BY MOUTH 2 (TWO) TIMES DAILY. 60 tablet 3  . atorvastatin (LIPITOR) 20 MG tablet Take 1 tablet (20 mg total) by mouth daily. 90 tablet 3  . Blood Glucose Monitoring Suppl (TRUE METRIX METER) w/Device KIT Use twice daily 1 kit 0  . diclofenac sodium (VOLTAREN) 1 % GEL Apply 4 g topically 4 (four) times daily as needed. 100 g 3  . Dulaglutide (TRULICITY) 1.5 JS/2.8BT SOPN Inject 1.5 mg into the skin once a week. 2 mL 2  . furosemide (LASIX) 20 MG tablet Take 1 tablet (20 mg total) by mouth as needed for edema. 30 tablet  2  . glipiZIDE (GLUCOTROL) 10 MG tablet TAKE 1 TABLET (10 MG TOTAL) BY MOUTH 2 (TWO) TIMES DAILY BEFORE A MEAL. (Patient taking differently: Take 10 mg by mouth daily. ) 60 tablet 2  . glucose blood (TRUE METRIX BLOOD GLUCOSE TEST) test strip Use as instructed. Check blood glucose level by fingerstick twice per day. 200 each 12  . Insulin Pen Needle (B-D UF III MINI PEN NEEDLES) 31G X 5 MM MISC Use as instructed. Inject into the skin once nightly. 100 each 6  . metFORMIN (GLUCOPHAGE-XR) 500 MG 24 hr tablet Take 4 tablets (2,000 mg total) by mouth daily. (Patient taking differently: Take 2,000 mg by mouth at bedtime. ) 120 tablet 3  . methimazole (TAPAZOLE) 5 MG tablet Take 1 tablet (5 mg total) by mouth 3 (three) times daily. 90 tablet 2  . Methylsulfonylmethane (MSM PO) Take 2 tablets by mouth daily.    . metoprolol succinate (TOPROL-XL) 50 MG 24 hr tablet Take 1 tablet (50 mg total) by mouth daily. 30 tablet 3  . Multiple Vitamins-Minerals (ALIVE WOMENS 50+) TABS Take 1 tablet by mouth daily.    . TRUEplus Lancets 28G MISC Use twice daily for blood glucose check 100 each 1   No current facility-administered medications on file prior to visit.   No Known Allergies Family History  Problem Relation Age of Onset  . Hypertension Mother   .  Diabetes Mellitus II Sister   . Cancer Maternal Grandmother     PE: BP 130/88   Pulse 78   Ht '5\' 7"'$  (1.702 m)   Wt 293 lb (132.9 kg)   SpO2 95%   BMI 45.89 kg/m  Wt Readings from Last 3 Encounters:  01/24/20 293 lb (132.9 kg)  01/16/20 290 lb 3.2 oz (131.6 kg)  12/23/19 (!) 300 lb 4.8 oz (136.2 kg)   Constitutional: overweight, in NAD Eyes: PERRLA, EOMI, no exophthalmos, no lid lag, no stare ENT: moist mucous membranes, no thyromegaly, no thyroid bruits, no cervical lymphadenopathy Cardiovascular: RRR, No MRG Respiratory: CTA B Gastrointestinal: abdomen soft, NT, ND, BS+ Musculoskeletal: no deformities, strength intact in all 4 Skin: moist, warm, no rashes Neurological: no tremor with outstretched hands, DTR normal in all 4  ASSESSMENT: 1. Thyrotoxicosis  2.  Goiter  PLAN:  1. Patient with a recently found low TSH, with thyrotoxic sxs: weight loss, heat intolerance (which is chronic, though), shortness of breath, weakness.  - she does not appear to have exogenous causes for the low TSH.  - We discussed that possible causes of thyrotoxicosis are:  [name] Graves ds   . Thyroiditis) . toxic multinodular goiter/ toxic adenoma (she has no nodules on recent thyroid ultrasound) - will check the TSH, fT3 and fT4 and also add thyroid stimulating antibodies to screen for Graves' disease.  - If the tests remain abnormal, we may need an uptake and scan to differentiate between the 3 above possible etiologies  - we discussed about possible modalities of treatment for the above conditions, to include methimazole use, radioactive iodine ablation or (last resort) surgery.  She is currently on methimazole 5 mg 3 times a day, which she tolerates well.  We will continue this dose for now - will also continue the Toprol-XL 75 mg daily -no complaints of palpitations and her pulse today is normal - no signs of Graves' ophthalmopathy: she does not have any double vision, blurry vision, eye pain,  chemosis. - I advised her to join my chart to communicate easier -  RTC in 3 months, but likely sooner for repeat labs  2.  Goiter -This is large extending in her upper mediastinum -Reviewing the images of her recent chest x-ray from 12/20/2019, her trachea is slightly deviated to the right -No significant neck compression symptoms -We discussed about the possible need for surgery for such a large goiter especially in the setting of thyrotoxicosis.  For now, she just started on a CPAP for sleep apnea and she feels much better.  I will reevaluate her at next visit.  We may also need a thyroid uptake and scan for her thyrotoxicosis and if her left thyroid lobe has higher uptake, she may benefit from left hemithyroidectomy.  She agrees with the plan.  Lab on 01/24/2020  Component Date Value Ref Range Status  . TSI 01/24/2020 658* <140 % baseline Final   Comment: . Thyroid stimulating immunoglobulins (TSI) can engage the TSH receptors resulting in hyperthyroidism in Graves' disease patients. TSI levels can be useful in monitoring the clinical outcome of Graves' disease as well as assessing the potential for hyperthyroidism from maternal-fetal transfer. TSI results greater than or equal to (>=) 140% of the Reference Control are considered positive. [name] NOTE: A serum TSH level greater than 350 micro-International Units/mL can interfere with the TSI bioassay and potentially give false positive results. . Patients who are pregnant and are suspected of having hyperthyroidism should have both TSI and human Chorionic Gonadotropin(hCG) tests measured. A serum hCG level greater than 40,625 mIU/mL can interfere with the TSI bioassay and may give false negative results. In these patients it is recommended that a second TSI be obtained when the hCG concentration falls below 40,625 mIU/mL (usually after approximately 20-weeks gestation)                          . . The analytical performance  characteristics of this assay have been determined by [facility], [place], [place].  The modifications have not been cleared or approved by the FDA.  This assay has been validated pursuant to the CLIA regulations and is used for clinical purposes. .   . T3, Free 01/24/2020 3.0  2.3 - 4.2 pg/mL Final  . Free T4 01/24/2020 0.45* 0.60 - 1.60 ng/dL Final   Comment: Specimens from patients who are undergoing biotin therapy and /or ingesting biotin supplements may contain high levels of biotin.  The higher biotin concentration in these specimens interferes with this Free T4 assay.  Specimens that contain high levels  of biotin may cause false high results for this Free T4 assay.  Please interpret results in light of the total clinical presentation of the patient.    [name] TSH 01/24/2020 4.51* 0.35 - 4.50 uIU/mL Final   TSI antibodies high. TSH is slightly elevated and free T4 is slightly low.  At this point, I will suggest to decrease the dose of methimazole to 5 mg twice a day and repeat her tests in 4 weeks.  We can also back off Toprol-XL to only 50 mg daily.  Philemon Kingdom, MD PhD [facility] Endocrinology

## 2020-01-24 ENCOUNTER — Other Ambulatory Visit (INDEPENDENT_AMBULATORY_CARE_PROVIDER_SITE_OTHER): Payer: Self-pay

## 2020-01-24 ENCOUNTER — Encounter: Payer: Self-pay | Admitting: Nurse Practitioner

## 2020-01-24 ENCOUNTER — Other Ambulatory Visit: Payer: Self-pay

## 2020-01-24 ENCOUNTER — Ambulatory Visit: Payer: Self-pay | Admitting: Internal Medicine

## 2020-01-24 ENCOUNTER — Encounter: Payer: Self-pay | Admitting: Internal Medicine

## 2020-01-24 ENCOUNTER — Telehealth: Payer: Self-pay

## 2020-01-24 ENCOUNTER — Ambulatory Visit (INDEPENDENT_AMBULATORY_CARE_PROVIDER_SITE_OTHER): Payer: Self-pay | Admitting: Internal Medicine

## 2020-01-24 VITALS — BP 130/88 | HR 78 | Ht 67.0 in | Wt 293.0 lb

## 2020-01-24 DIAGNOSIS — E049 Nontoxic goiter, unspecified: Secondary | ICD-10-CM

## 2020-01-24 DIAGNOSIS — E040 Nontoxic diffuse goiter: Secondary | ICD-10-CM

## 2020-01-24 DIAGNOSIS — E0590 Thyrotoxicosis, unspecified without thyrotoxic crisis or storm: Secondary | ICD-10-CM

## 2020-01-24 LAB — T3, FREE: T3, Free: 3 pg/mL (ref 2.3–4.2)

## 2020-01-24 LAB — T4, FREE: Free T4: 0.45 ng/dL — ABNORMAL LOW (ref 0.60–1.60)

## 2020-01-24 LAB — TSH: TSH: 4.51 u[IU]/mL — ABNORMAL HIGH (ref 0.35–4.50)

## 2020-01-24 MED ORDER — METHIMAZOLE 5 MG PO TABS: 5.0000 mg | ORAL_TABLET | Freq: Two times a day (BID) | ORAL | 2 refills | Status: DC

## 2020-01-24 NOTE — Patient Instructions (Addendum)
Please stop at the lab.  Please continue: - Methimazole 5 mg 3x a day with meals - Toprol XL 50 mg in am and 25 mg at night  Please come back for a follow-up appointment in 3 months.  Hyperthyroidism  Hyperthyroidism is when the thyroid gland is too active (overactive). The thyroid gland is a small gland located in the lower front part of the neck, just in front of the windpipe (trachea). This gland makes hormones that help control how the body uses food for energy (metabolism) as well as how the heart and brain function. These hormones also play a role in keeping your bones strong. When the thyroid is overactive, it produces too much of a hormone called thyroxine. What are the causes? This condition may be caused by:  Graves' disease. This is a disorder in which the body's disease-fighting system (immune system) attacks the thyroid gland. This is the most common cause.  Inflammation of the thyroid gland.  A tumor in the thyroid gland.  Use of certain medicines, including: ? Prescription thyroid hormone replacement. ? Herbal supplements that mimic thyroid hormones. ? Amiodarone therapy.  Solid or fluid-filled lumps within your thyroid gland (thyroid nodules).  Taking in a large amount of iodine from foods or medicines. What increases the risk? You are more likely to develop this condition if:  You are female.  You have a family history of thyroid conditions.  You smoke tobacco.  You use a medicine called lithium.  You take medicines that affect the immune system (immunosuppressants). What are the signs or symptoms? Symptoms of this condition include:  Nervousness.  Inability to tolerate heat.  Unexplained weight loss.  Diarrhea.  Change in the texture of hair or skin.  Heart skipping beats or making extra beats.  Rapid heart rate.  Loss of menstruation.  Shaky hands.  Fatigue.  Restlessness.  Sleep problems.  Enlarged thyroid gland or a lump in the  thyroid (nodule). You may also have symptoms of Graves' disease, which may include:  Protruding eyes.  Dry eyes.  Red or swollen eyes.  Problems with vision. How is this diagnosed? This condition may be diagnosed based on:  Your symptoms and medical history.  A physical exam.  Blood tests.  Thyroid ultrasound. This test involves using sound waves to produce images of the thyroid gland.  A thyroid scan. A radioactive substance is injected into a vein, and images show how much iodine is present in the thyroid.  Radioactive iodine uptake test (RAIU). A small amount of radioactive iodine is given by mouth to see how much iodine the thyroid absorbs after a certain amount of time. How is this treated? Treatment depends on the cause and severity of the condition. Treatment may include:  Medicines to reduce the amount of thyroid hormone your body makes.  Radioactive iodine treatment (radioiodine therapy). This involves swallowing a small dose of radioactive iodine, in capsule or liquid form, to kill thyroid cells.  Surgery to remove part or all of your thyroid gland. You may need to take thyroid hormone replacement medicine for the rest of your life after thyroid surgery.  Medicines to help manage your symptoms. Follow these instructions at home:   Take over-the-counter and prescription medicines only as told by your health care provider.  Do not use any products that contain nicotine or tobacco, such as cigarettes and e-cigarettes. If you need help quitting, ask your health care provider.  Follow any instructions from your health care provider about diet.  You may be instructed to limit foods that contain iodine.  Keep all follow-up visits as told by your health care provider. This is important. ? You will need to have blood tests regularly so that your health care provider can monitor your condition. Contact a health care provider if:  Your symptoms do not get better with  treatment.  You have a fever.  You are taking thyroid hormone replacement medicine and you: ? Have symptoms of depression. ? Feel like you are tired all the time. ? Gain weight. Get help right away if:  You have chest pain.  You have decreased alertness or a change in your awareness.  You have abdominal pain.  You feel dizzy.  You have a rapid heartbeat.  You have an irregular heartbeat.  You have difficulty breathing. Summary  The thyroid gland is a small gland located in the lower front part of the neck, just in front of the windpipe (trachea).  Hyperthyroidism is when the thyroid gland is too active (overactive) and produces too much of a hormone called thyroxine.  The most common cause is Graves' disease, a disorder in which your immune system attacks the thyroid gland.  Hyperthyroidism can cause various symptoms, such as unexplained weight loss, nervousness, inability to tolerate heat, or changes in your heartbeat.  Treatment may include medicine to reduce the amount of thyroid hormone your body makes, radioiodine therapy, surgery, or medicines to manage symptoms. This information is not intended to replace advice given to you by your health care provider. Make sure you discuss any questions you have with your health care provider. Document Revised: 11/03/2017 Document Reviewed: 11/01/2017 Elsevier Patient Education  2020 [employer].

## 2020-01-24 NOTE — Telephone Encounter (Signed)
-----   Message from Philemon Kingdom, MD sent at 01/24/2020  3:24 PM EST ----- Lenna Sciara, can you please call pt:  Her TSH is slightly elevated now and free T4 is slightly low.  This means that we can decrease the dose of methimazole to 5 mg twice a day and repeat her tests in 4 weeks.  We can also back off Toprol-XL to only 50 mg daily. I will let her know as soon as her Graves' antibodies returned.

## 2020-01-25 IMAGING — CR DG CHEST 2V
2 series · 2 of 2 positions shown · non-contrast
Comparison: 08/09/2016

CLINICAL DATA: Short of breath

EXAM:
CHEST - 2 VIEW

[w chest pa]
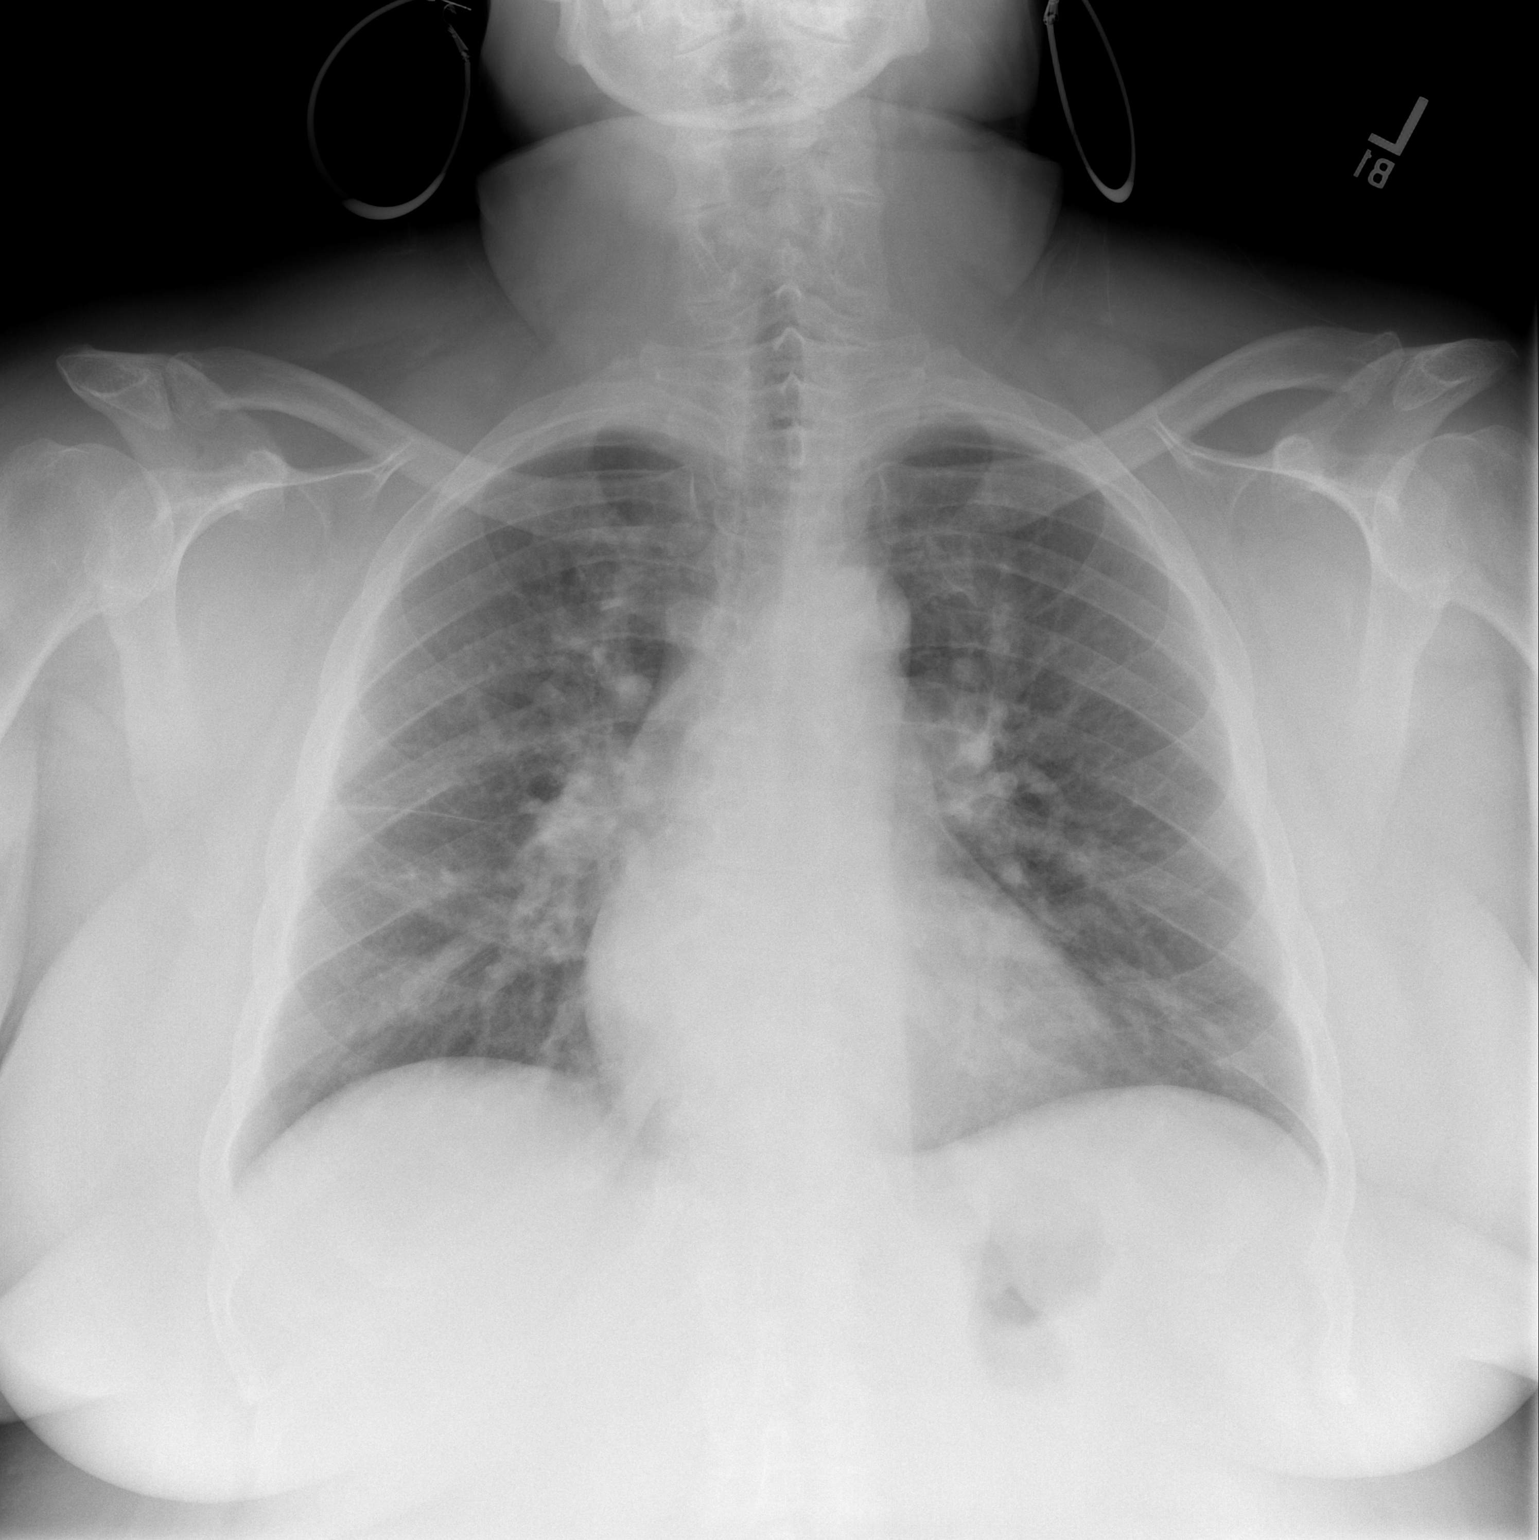

[w chest lat]
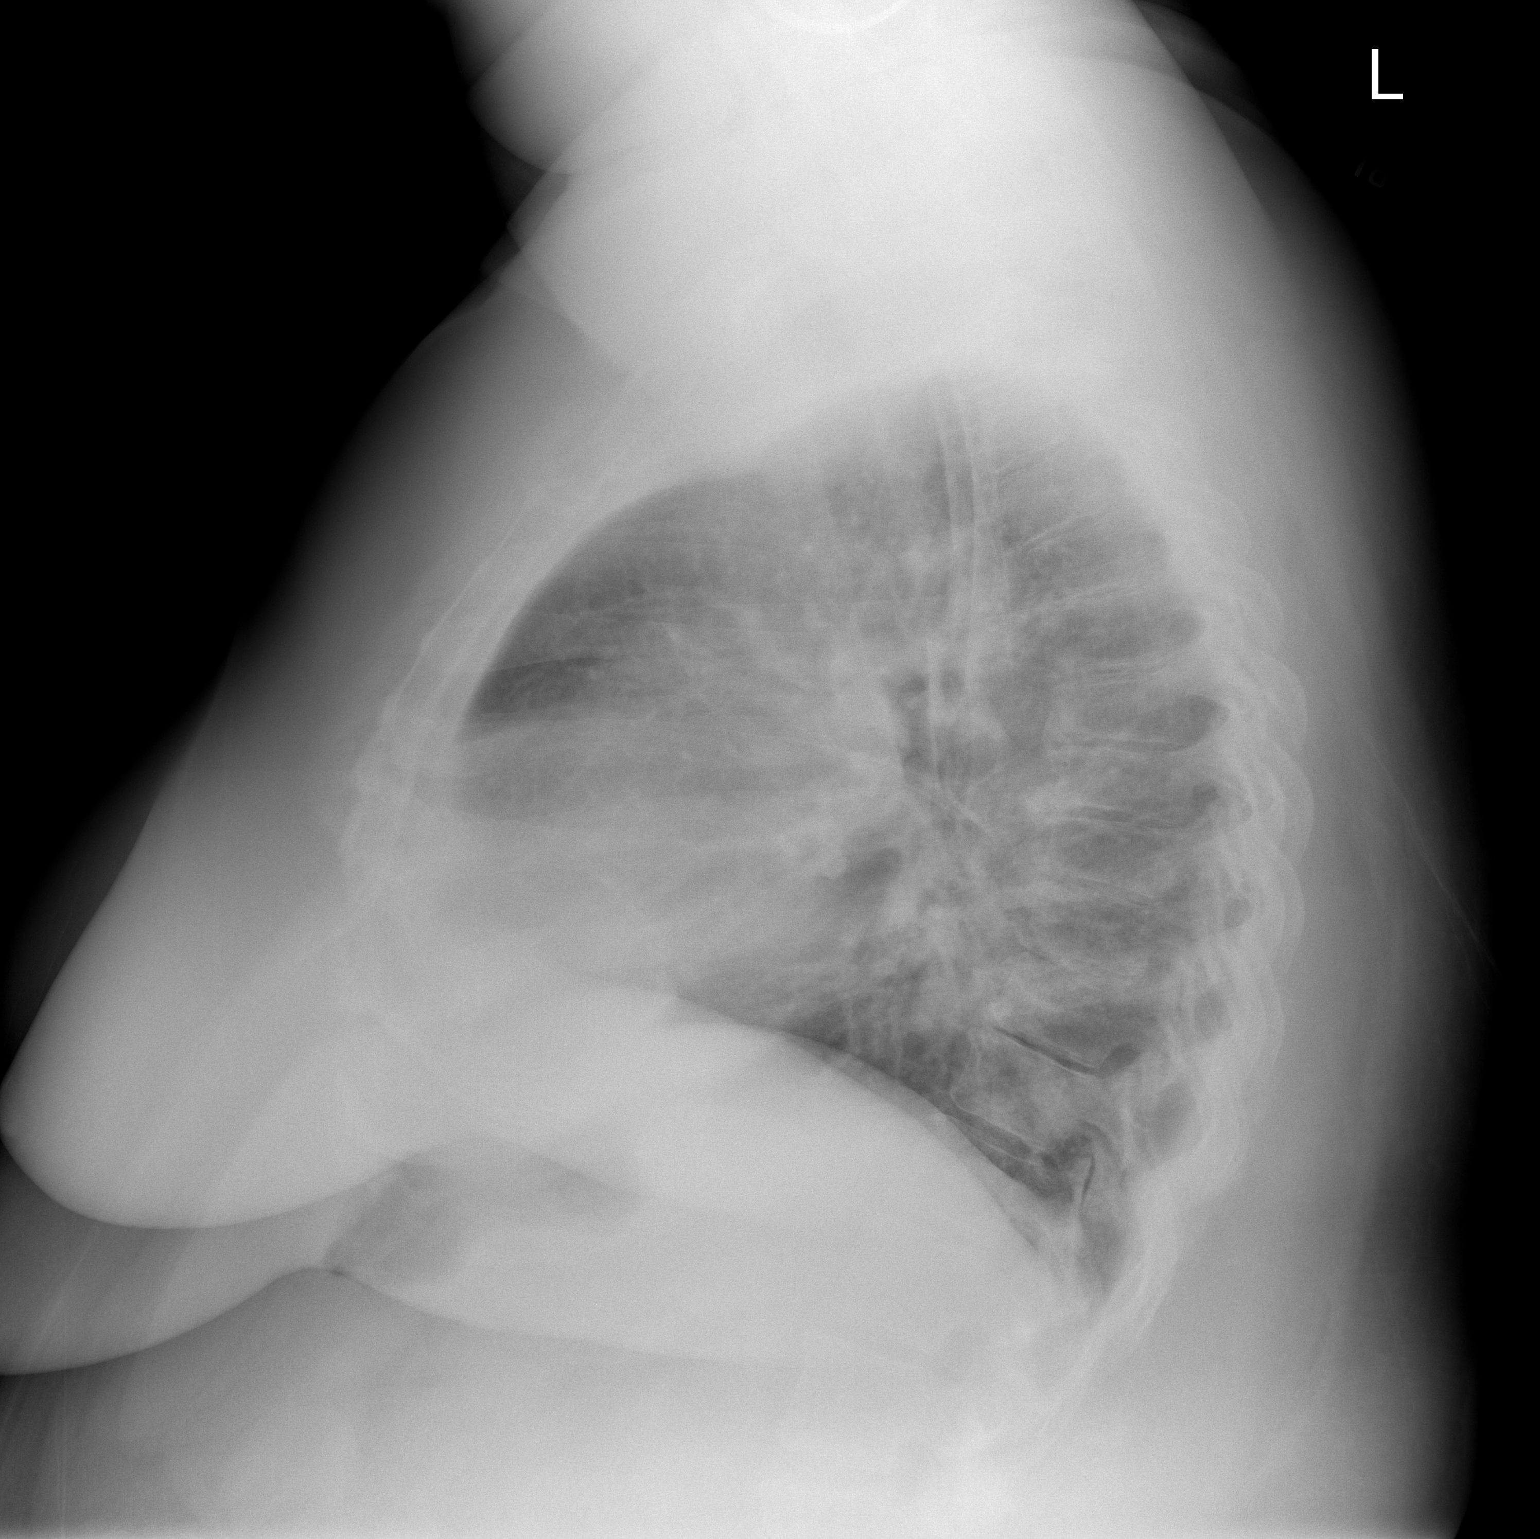

[2 of 2 positions shown; findings below may reference images not displayed]

FINDINGS: The heart size and mediastinal contours are within normal limits.
Both lungs are clear. The visualized skeletal structures are
unremarkable.
IMPRESSION: No active cardiopulmonary disease.

## 2020-01-27 ENCOUNTER — Telehealth: Payer: Self-pay | Admitting: Cardiovascular Disease

## 2020-01-27 MED ORDER — METHIMAZOLE 5 MG PO TABS: 5.0000 mg | ORAL_TABLET | Freq: Two times a day (BID) | ORAL | 1 refills | Status: DC

## 2020-01-27 MED FILL — methIMAzole 5 MG TABS: 5 | 30 days supply | Qty: 60 | Fill #0

## 2020-01-27 MED FILL — FUROSEMIDE 20 MG TABS: 20 | 30 days supply | Qty: 30 | Fill #1

## 2020-01-27 MED FILL — METOPROLOL SUCCINATE ER 50: 50 | 30 days supply | Qty: 45 | Fill #2

## 2020-01-27 NOTE — Telephone Encounter (Signed)
New Message    Pt is calling and would like for Mariann Laster to call her back    Please call

## 2020-01-27 NOTE — Telephone Encounter (Signed)
Left message for patient to return our call at [phone].  

## 2020-01-27 NOTE — Telephone Encounter (Signed)
Notified patient of message from Dr. Cruzita Lederer, patient expressed understanding and agreement. No further questions.  RX for Methimazole sent.

## 2020-01-28 NOTE — Telephone Encounter (Signed)
Left message returning call. Call me back if assistance still needed. Direct # left for patient to return a call.

## 2020-01-30 LAB — THYROID STIMULATING IMMUNOGLOBULIN: TSI: 658 % baseline — ABNORMAL HIGH (ref <140)

## 2020-01-30 NOTE — Progress Notes (Signed)
Melissa, can you please call pt: Patient's Graves' antibodies are high, confirming Graves' disease, suspected.  We can continue with the plan to decrease the methimazole and metoprolol for now.

## 2020-01-31 ENCOUNTER — Telehealth: Payer: Self-pay

## 2020-01-31 NOTE — Telephone Encounter (Signed)
-----   Message from Philemon Kingdom, MD sent at 01/30/2020  5:01 PM EST ----- Lenna Sciara, can you please call pt: Patient's Graves' antibodies are high, confirming Graves' disease, suspected.  We can continue with the plan to decrease the methimazole and metoprolol for now.

## 2020-01-31 NOTE — Telephone Encounter (Signed)
Notified patient of message from Dr. Gherghe, patient expressed understanding and agreement. No further questions.  

## 2020-02-07 ENCOUNTER — Telehealth: Payer: Self-pay | Admitting: *Deleted

## 2020-02-07 ENCOUNTER — Telehealth: Payer: Self-pay | Admitting: Internal Medicine

## 2020-02-07 NOTE — Telephone Encounter (Signed)
Patient called to advise that she does not believe that her medicine is work well. She is unable to sleep.  She has requested a call back regarding this and advice on how to proceed

## 2020-02-07 NOTE — Telephone Encounter (Signed)
Patient called in to tell me that her BIPAP machine did not seem to be putting out the same pressure. She accidentally hit it, and feels she may have "messed up the setting." I told her that I wouldn't be able to tell until the machine was turned back on. However I went into airview and clicked off of her settings and reentered the same pressure settings just in case. This way the pressures will be the same if she did hit something.

## 2020-02-07 NOTE — Telephone Encounter (Signed)
Spoke to patient, she takes 50 MG during the day and half of the 50 at night.

## 2020-02-07 NOTE — Telephone Encounter (Signed)
Left message for patient to return our call at [phone].  

## 2020-02-07 NOTE — Telephone Encounter (Signed)
OK, Let's switch to take the lower dose in the morning and the higher dose at night

## 2020-02-07 NOTE — Telephone Encounter (Signed)
M, she was previously on 75 mg Toprol-XL daily.  We recently backed off to 50 mg daily.  Please advise her that for now, she can either take this dose at bedtime or, if she is already doing that, she can take a full tablet during the day and half a tablet at bedtime and this will help her a little bit better with sleep.  I would continue the methimazole at the current dose.

## 2020-02-07 NOTE — Telephone Encounter (Signed)
Notified patient of message from Dr. Gherghe, patient expressed understanding and agreement. No further questions.  

## 2020-02-10 ENCOUNTER — Ambulatory Visit: Payer: Self-pay | Admitting: Pharmacist

## 2020-02-10 ENCOUNTER — Telehealth: Payer: Self-pay

## 2020-02-10 NOTE — Telephone Encounter (Signed)
Telephoned patient at home number. Left a voice message for patient to schedule with [facility].

## 2020-02-11 ENCOUNTER — Other Ambulatory Visit: Payer: Self-pay

## 2020-02-11 ENCOUNTER — Ambulatory Visit: Payer: Self-pay | Attending: Nurse Practitioner | Admitting: Pharmacist

## 2020-02-11 DIAGNOSIS — E1169 Type 2 diabetes mellitus with other specified complication: Secondary | ICD-10-CM

## 2020-02-11 NOTE — Progress Notes (Signed)
Patient was educated on the use of the MyPlate method. Reviewed recommended dietary restrictions for patients with DM. Also reviewed goal blood glucose levels. All questions and concerns were addressed.  POCT glucose: [place], PharmD, [place] [phone]

## 2020-02-17 ENCOUNTER — Telehealth: Payer: Self-pay

## 2020-02-17 NOTE — Telephone Encounter (Signed)
Telephoned patient at home number. Left a voice message for patient to call and schedule with [facility]. °

## 2020-02-27 MED FILL — glipiZIDE 10 MG TABS: 10 | 30 days supply | Qty: 60 | Fill #2

## 2020-02-27 MED FILL — methIMAzole 5 MG TABS: 5 | 30 days supply | Qty: 60 | Fill #1

## 2020-02-27 MED FILL — ATORVASTATIN CALCIUM 20 MG: 20 | 30 days supply | Qty: 30 | Fill #5

## 2020-02-27 MED FILL — $ELIQUIS 5 MG TABLET: 5 | 30 days supply | Qty: 60 | Fill #1

## 2020-02-27 MED FILL — METOPROLOL SUCCINATE ER 50: 50 | 30 days supply | Qty: 45 | Fill #3

## 2020-03-03 MED FILL — metFORMIN HCL ER 500 MG TB2: 500 | 30 days supply | Qty: 120 | Fill #1

## 2020-03-13 ENCOUNTER — Telehealth: Payer: Self-pay | Admitting: Nurse Practitioner

## 2020-03-13 ENCOUNTER — Ambulatory Visit (LOCAL_COMMUNITY_HEALTH_CENTER): Payer: Self-pay

## 2020-03-13 ENCOUNTER — Other Ambulatory Visit: Payer: Self-pay

## 2020-03-13 DIAGNOSIS — Z111 Encounter for screening for respiratory tuberculosis: Secondary | ICD-10-CM

## 2020-03-13 NOTE — Telephone Encounter (Signed)
Patient called requesting to speak with the nurse regarding the COVID vaccine. Please f/u

## 2020-03-16 ENCOUNTER — Other Ambulatory Visit: Payer: Self-pay

## 2020-03-16 ENCOUNTER — Ambulatory Visit (LOCAL_COMMUNITY_HEALTH_CENTER): Payer: Self-pay

## 2020-03-16 DIAGNOSIS — Z111 Encounter for screening for respiratory tuberculosis: Secondary | ICD-10-CM

## 2020-03-16 LAB — TB SKIN TEST
Induration: 0 mm
TB Skin Test: NEGATIVE

## 2020-03-16 NOTE — Telephone Encounter (Addendum)
Spoke to patient. Pt. Wanted to see which Covid vaccine should she get based on her medical conditions. CMA informed patient that is a a patient's preference on which covid vaccine she would like to get.   Pt. Understood.

## 2020-03-17 MED FILL — TRULICITY 1.5 MG/0.5 ML PEN: 1.5 | 27 days supply | Qty: 2 | Fill #1

## 2020-03-23 ENCOUNTER — Ambulatory Visit: Payer: Self-pay | Admitting: Nurse Practitioner

## 2020-03-24 ENCOUNTER — Other Ambulatory Visit: Payer: Self-pay | Admitting: Nurse Practitioner

## 2020-03-24 ENCOUNTER — Encounter: Payer: Self-pay | Admitting: Nurse Practitioner

## 2020-03-24 ENCOUNTER — Ambulatory Visit: Payer: Self-pay | Attending: Nurse Practitioner | Admitting: Nurse Practitioner

## 2020-03-24 ENCOUNTER — Other Ambulatory Visit: Payer: Self-pay

## 2020-03-24 VITALS — BP 116/72 | HR 81 | Temp 97.7°F | Ht 67.0 in | Wt 297.0 lb

## 2020-03-24 DIAGNOSIS — E1169 Type 2 diabetes mellitus with other specified complication: Secondary | ICD-10-CM

## 2020-03-24 DIAGNOSIS — E7800 Pure hypercholesterolemia, unspecified: Secondary | ICD-10-CM

## 2020-03-24 DIAGNOSIS — I10 Essential (primary) hypertension: Secondary | ICD-10-CM

## 2020-03-24 LAB — GLUCOSE, POCT (MANUAL RESULT ENTRY): POC Glucose: 101 mg/dl — AB (ref 70–99)

## 2020-03-24 MED ORDER — TRUE METRIX BLOOD GLUCOSE TEST VI STRP: ORAL_STRIP | 12 refills | Status: AC

## 2020-03-24 MED ORDER — GLIPIZIDE 10 MG PO TABS: 10.0000 mg | ORAL_TABLET | Freq: Two times a day (BID) | ORAL | 1 refills | Status: DC

## 2020-03-24 MED ORDER — TRULICITY 1.5 MG/0.5ML ~~LOC~~ SOAJ: 1.5000 mg | SUBCUTANEOUS | 2 refills | Status: DC

## 2020-03-24 MED ORDER — TRUEPLUS LANCETS 28G MISC: 6 refills | Status: AC

## 2020-03-24 MED ORDER — ATORVASTATIN CALCIUM 20 MG PO TABS: 20.0000 mg | ORAL_TABLET | Freq: Every day | ORAL | 3 refills | Status: DC

## 2020-03-24 MED ORDER — METFORMIN HCL ER 500 MG PO TB24: 2000.0000 mg | ORAL_TABLET | Freq: Every day | ORAL | 1 refills | Status: DC

## 2020-03-24 MED ORDER — BD PEN NEEDLE MINI U/F 31G X 5 MM MISC: 6 refills | Status: DC

## 2020-03-24 MED FILL — ATORVASTATIN CALCIUM 20 MG: 20 | 30 days supply | Qty: 30 | Fill #0

## 2020-03-24 MED FILL — TRUEplus LANCETS 28G MISC: 50 days supply | Qty: 100 | Fill #0

## 2020-03-24 MED FILL — TRUE METRIX TEST STRIP: 50 days supply | Qty: 100 | Fill #0

## 2020-03-24 MED FILL — glipiZIDE 10 MG TABS: 10 | 30 days supply | Qty: 60 | Fill #0

## 2020-03-24 NOTE — Progress Notes (Signed)
Assessment & Plan:  Diagnoses and all orders for this visit:  Type 2 diabetes mellitus with other specified complication, without long-term current use of insulin (HCC) -     Glucose (CBG) -     Hemoglobin A1c -     Microalbumin/Creatinine Ratio, Urine -     Dulaglutide (TRULICITY) 1.5 GB/2.0FE SOPN; Inject 1.5 mg into the skin once a week. -     glipiZIDE (GLUCOTROL) 10 MG tablet; Take 1 tablet (10 mg total) by mouth 2 (two) times daily before a meal. -     glucose blood (TRUE METRIX BLOOD GLUCOSE TEST) test strip; Use as instructed. Check blood glucose level by fingerstick twice per day. -     Insulin Pen Needle (B-D UF III MINI PEN NEEDLES) 31G X 5 MM MISC; Use as instructed. Inject into the skin once nightly. -     metFORMIN (GLUCOPHAGE-XR) 500 MG 24 hr tablet; Take 4 tablets (2,000 mg total) by mouth at bedtime. -     TRUEplus Lancets 28G MISC; Use twice daily for blood glucose check -     CMP14+EGFR Continue blood sugar control as discussed in office today, low carbohydrate diet, and regular physical exercise as tolerated, 150 minutes per week (30 min each day, 5 days per week, or 50 min 3 days per week). Keep blood sugar logs with fasting goal of 90-130 mg/dl, post prandial (after you eat) less than 180.  For Hypoglycemia: BS <60 and Hyperglycemia BS >400; contact the clinic ASAP. Annual eye exams and foot exams are recommended.   Pure hypercholesterolemia -     atorvastatin (LIPITOR) 20 MG tablet; Take 1 tablet (20 mg total) by mouth daily. INSTRUCTIONS: Work on a low fat, heart healthy diet and participate in regular aerobic exercise program by working out at least 150 minutes per week; 5 days a week-30 minutes per day. Avoid red meat/beef/steak,  fried foods. junk foods, sodas, sugary drinks, unhealthy snacking, alcohol and smoking.  Drink at least 80 oz of water per day and monitor your carbohydrate intake daily.    Essential hypertension -     CMP14+EGFR Continue all  antihypertensives as prescribed.  Remember to bring in your blood pressure log with you for your follow up appointment.  DASH/Mediterranean Diets are healthier choices for HTN. AVOID  NSAIDs   Patient has been counseled on age-appropriate routine health concerns for screening and prevention. These are reviewed and up-to-date. Referrals have been placed accordingly. Immunizations are up-to-date or declined.    Subjective:  No chief complaint on file.  HPI Haley [name] 54 y.o. female presents to office today for follow up.  has a past medical history of Essential hypertension, Fibroids, Hyperthyroidism, Morbid obesity ([place]), Paroxysmal atrial fibrillation ([place]), Snores, and Type II diabetes mellitus ([place]).   Has an appointment with Dr. Renne Crigler next month for Hyperthyroidism  History of PAF 2/2 hyperthyroidsm: Taking Toprol XL 50 mg daily and eliquis 5 mg BID. She takes lasix as needed for intermittent BLE swelling.   Referral placed to [facility] today for mammogram.   She does endorse fatigue and shortness of breath with activity/exertion. Has gained almost 7lbs since February. We discussed her diet in detail today. She has been eating a lot of processed meats which I have asked her today to avoid. She denies chest pain or increased BLE swelling.   DM TYPE 2 Checking postprandial readings with average: 100-110s. Taking trulicity 1.5 mg weekly, glipizide 10 mg BID, metformin 2000 mg at  bedtime (patient preference). Denies any hypoglycemic symptoms. Overdue for eye exam. LDL not at goal of <70. Likely diet related. Taking atorvastatin 20 mg daily as prescribed.  BP at goal of <130/80.  Lab Results  Component Value Date   HGBA1C 7.8 (H) 12/22/2019   BP Readings from Last 3 Encounters:  03/24/20 116/72  01/24/20 130/88  01/16/20 111/68   Lab Results  Component Value Date   LDLCALC 82 09/06/2019    Review of Systems  Constitutional: Positive for malaise/fatigue. Negative for fever  and weight loss.  HENT: Negative.  Negative for nosebleeds.   Eyes: Negative.  Negative for blurred vision, double vision and photophobia.  Respiratory: Positive for shortness of breath. Negative for cough.   Cardiovascular: Negative.  Negative for chest pain, palpitations and leg swelling.  Gastrointestinal: Negative.  Negative for heartburn, nausea and vomiting.  Musculoskeletal: Negative.  Negative for myalgias.  Neurological: Negative.  Negative for dizziness, focal weakness, seizures and headaches.  Psychiatric/Behavioral: Negative.  Negative for suicidal ideas.    Past Medical History:  Diagnosis Date  . Essential hypertension   . Fibroids   . Hyperthyroidism    a. 2010, treated w/ tapazole.  . Morbid obesity ([place])   . Paroxysmal atrial fibrillation ([place])    a. 03/2009 - in setting of hyperthyroidism and caffeine intake, converted spontaneously;  b. 08/2016 ED visit for recurrent PAF-->successful DCCV in ED.  [name] Snores   . Type II diabetes mellitus ([place])     Past Surgical History:  Procedure Laterality Date  . UTERINE FIBROID SURGERY      Family History  Problem Relation Age of Onset  . Hypertension Mother   . Diabetes Mellitus II Sister   . Cancer Maternal Grandmother     Social History Reviewed with no changes to be made today.   Outpatient Medications Prior to Visit  Medication Sig Dispense Refill  . apixaban (ELIQUIS) 5 MG TABS tablet TAKE 1 TABLET (5 MG TOTAL) BY MOUTH 2 (TWO) TIMES DAILY. 60 tablet 3  . Blood Glucose Monitoring Suppl (TRUE METRIX METER) w/Device KIT Use twice daily 1 kit 0  . furosemide (LASIX) 20 MG tablet Take 1 tablet (20 mg total) by mouth as needed for edema. 30 tablet 2  . methimazole (TAPAZOLE) 5 MG tablet Take 1 tablet (5 mg total) by mouth 2 (two) times daily. 180 tablet 1  . Methylsulfonylmethane (MSM PO) Take 2 tablets by mouth daily.    . metoprolol succinate (TOPROL-XL) 50 MG 24 hr tablet Take 1 tablet (50 mg total) by mouth daily.  30 tablet 3  . Multiple Vitamins-Minerals (ALIVE WOMENS 50+) TABS Take 1 tablet by mouth daily.    [name] atorvastatin (LIPITOR) 20 MG tablet Take 1 tablet (20 mg total) by mouth daily. 90 tablet 3  . Dulaglutide (TRULICITY) 1.5 DQ/2.2WL SOPN Inject 1.5 mg into the skin once a week. 2 mL 2  . glipiZIDE (GLUCOTROL) 10 MG tablet TAKE 1 TABLET (10 MG TOTAL) BY MOUTH 2 (TWO) TIMES DAILY BEFORE A MEAL. (Patient taking differently: Take 10 mg by mouth daily. ) 60 tablet 2  . glucose blood (TRUE METRIX BLOOD GLUCOSE TEST) test strip Use as instructed. Check blood glucose level by fingerstick twice per day. 200 each 12  . Insulin Pen Needle (B-D UF III MINI PEN NEEDLES) 31G X 5 MM MISC Use as instructed. Inject into the skin once nightly. 100 each 6  . metFORMIN (GLUCOPHAGE-XR) 500 MG 24 hr tablet Take 4 tablets (2,000  mg total) by mouth daily. (Patient taking differently: Take 2,000 mg by mouth at bedtime. ) 120 tablet 3  . TRUEplus Lancets 28G MISC Use twice daily for blood glucose check 100 each 1  . acetaminophen (TYLENOL 8 HOUR ARTHRITIS PAIN) 650 MG CR tablet Take 650 mg by mouth every 8 (eight) hours as needed for pain.    [name] diclofenac sodium (VOLTAREN) 1 % GEL Apply 4 g topically 4 (four) times daily as needed. (Patient not taking: Reported on 03/24/2020) 100 g 3   No facility-administered medications prior to visit.    No Known Allergies     Objective:    BP 116/72 (BP Location: Left Arm, Patient Position: Sitting, Cuff Size: Large)   Pulse 81   Temp 97.7 F (36.5 C) (Temporal)   Ht '5\' 7"'$  (1.702 m)   Wt 297 lb (134.7 kg)   SpO2 92%   BMI 46.52 kg/m  Wt Readings from Last 3 Encounters:  03/24/20 297 lb (134.7 kg)  01/24/20 293 lb (132.9 kg)  01/16/20 290 lb 3.2 oz (131.6 kg)    Physical Exam Vitals and nursing note reviewed.  Constitutional:      Appearance: She is well-developed.  HENT:     Head: Normocephalic and atraumatic.  Cardiovascular:     Rate and Rhythm: Normal rate and  regular rhythm.     Heart sounds: Normal heart sounds. No murmur. No friction rub. No gallop.   Pulmonary:     Effort: Pulmonary effort is normal. No tachypnea or respiratory distress.     Breath sounds: Normal breath sounds. No decreased breath sounds, wheezing, rhonchi or rales.  Chest:     Chest wall: No tenderness.  Abdominal:     General: Bowel sounds are normal.     Palpations: Abdomen is soft.  Musculoskeletal:        General: No swelling. Normal range of motion.     Cervical back: Normal range of motion.     Right lower leg: No edema.     Left lower leg: No edema.  Skin:    General: Skin is warm and dry.  Neurological:     Mental Status: She is alert and oriented to person, place, and time.     Coordination: Coordination normal.  Psychiatric:        Behavior: Behavior normal. Behavior is cooperative.        Thought Content: Thought content normal.        Judgment: Judgment normal.          Patient has been counseled extensively about nutrition and exercise as well as the importance of adherence with medications and regular follow-up. The patient was given clear instructions to go to ER or return to medical center if symptoms don't improve, worsen or new problems develop. The patient verbalized understanding.   Follow-up: Return in about 3 months (around 06/23/2020).   [name], FNP-BC [facility], [place]   03/24/2020, 7:52 PM

## 2020-03-25 ENCOUNTER — Other Ambulatory Visit: Payer: Self-pay | Admitting: Nurse Practitioner

## 2020-03-25 LAB — CMP14+EGFR
ALT: 15 IU/L (ref 0–32)
AST: 19 IU/L (ref 0–40)
Albumin/Globulin Ratio: 1.6 (ref 1.2–2.2)
Albumin: 4.6 g/dL (ref 3.8–4.9)
Alkaline Phosphatase: 62 IU/L (ref 39–117)
BUN/Creatinine Ratio: 18 (ref 9–23)
BUN: 10 mg/dL (ref 6–24)
Bilirubin Total: 0.3 mg/dL (ref 0.0–1.2)
CO2: 25 mmol/L (ref 20–29)
Calcium: 9.7 mg/dL (ref 8.7–10.2)
Chloride: 102 mmol/L (ref 96–106)
Creatinine, Ser: 0.57 mg/dL (ref 0.57–1.00)
GFR calc Af Amer: 122 mL/min/{1.73_m2} (ref >59)
GFR calc non Af Amer: 106 mL/min/{1.73_m2} (ref >59)
Globulin, Total: 2.8 g/dL (ref 1.5–4.5)
Glucose: 95 mg/dL (ref 65–99)
Potassium: 4 mmol/L (ref 3.5–5.2)
Sodium: 144 mmol/L (ref 134–144)
Total Protein: 7.4 g/dL (ref 6.0–8.5)

## 2020-03-25 LAB — MICROALBUMIN / CREATININE URINE RATIO
Creatinine, Urine: 105.2 mg/dL
Microalb/Creat Ratio: 163 mg/g creat — ABNORMAL HIGH (ref 0–29)
Microalbumin, Urine: 171 ug/mL

## 2020-03-25 LAB — HEMOGLOBIN A1C
Est. average glucose Bld gHb Est-mCnc: 166 mg/dL
Hgb A1c MFr Bld: 7.4 % — ABNORMAL HIGH (ref 4.8–5.6)

## 2020-03-25 MED ORDER — LISINOPRIL 2.5 MG PO TABS: 5.0000 mg | ORAL_TABLET | Freq: Every day | ORAL | 1 refills | Status: DC

## 2020-03-25 MED FILL — LISINOPRIL 2.5 MG TABLET: 2.5 | 30 days supply | Qty: 60 | Fill #0

## 2020-03-30 ENCOUNTER — Telehealth: Payer: Self-pay | Admitting: Cardiovascular Disease

## 2020-03-30 ENCOUNTER — Other Ambulatory Visit: Payer: Self-pay

## 2020-03-30 ENCOUNTER — Other Ambulatory Visit: Payer: Self-pay | Admitting: Cardiovascular Disease

## 2020-03-30 DIAGNOSIS — I10 Essential (primary) hypertension: Secondary | ICD-10-CM

## 2020-03-30 DIAGNOSIS — Z1231 Encounter for screening mammogram for malignant neoplasm of breast: Secondary | ICD-10-CM

## 2020-03-30 MED ORDER — METOPROLOL SUCCINATE ER 50 MG PO TB24: 50.0000 mg | ORAL_TABLET | Freq: Every day | ORAL | 1 refills | Status: DC

## 2020-03-30 MED FILL — METOPROLOL SUCCINATE ER 50: 50 | 30 days supply | Qty: 30 | Fill #0

## 2020-03-30 NOTE — Telephone Encounter (Signed)
Rx request sent to pharmacy.  

## 2020-03-30 NOTE — Telephone Encounter (Signed)
*  STAT* If patient is at the pharmacy, call can be transferred to refill team.   1. Which medications need to be refilled? (please list name of each medication and dose if known) metoprolol succinate (TOPROL-XL) 50 MG 24 hr tablet  2. Which pharmacy/location (including street and city if local pharmacy) is medication to be sent to? [place], [place]  3. Do they need a 30 day or 90 day supply? [place]

## 2020-03-30 NOTE — Telephone Encounter (Signed)
Patient would like Haley [name] to take a look at her cpap machine. Please advise.

## 2020-03-31 ENCOUNTER — Telehealth: Payer: Self-pay | Admitting: General Practice

## 2020-04-07 MED FILL — $TRULICITY 1.5 MG/0.5 ML PE: 1.5 | 84 days supply | Qty: 6 | Fill #0

## 2020-04-09 ENCOUNTER — Ambulatory Visit: Payer: Self-pay

## 2020-04-21 MED FILL — metFORMIN HCL ER 500 MG TB2: 500 | 30 days supply | Qty: 120 | Fill #2

## 2020-04-21 MED FILL — ATORVASTATIN CALCIUM 20 MG: 20 | 30 days supply | Qty: 30 | Fill #1

## 2020-04-21 MED FILL — glipiZIDE 10 MG TABS: 10 | 30 days supply | Qty: 60 | Fill #1

## 2020-04-21 MED FILL — LISINOPRIL 2.5 MG TABLET: 2.5 | 30 days supply | Qty: 60 | Fill #1

## 2020-04-22 ENCOUNTER — Ambulatory Visit: Payer: Self-pay | Admitting: Internal Medicine

## 2020-04-23 ENCOUNTER — Other Ambulatory Visit: Payer: Self-pay

## 2020-04-23 ENCOUNTER — Ambulatory Visit: Payer: Self-pay | Admitting: Internal Medicine

## 2020-04-23 ENCOUNTER — Telehealth: Payer: Self-pay

## 2020-04-23 ENCOUNTER — Encounter: Payer: Self-pay | Admitting: Internal Medicine

## 2020-04-23 VITALS — BP 130/70 | HR 88 | Ht 67.0 in | Wt 294.0 lb

## 2020-04-23 DIAGNOSIS — E040 Nontoxic diffuse goiter: Secondary | ICD-10-CM

## 2020-04-23 DIAGNOSIS — E049 Nontoxic goiter, unspecified: Secondary | ICD-10-CM

## 2020-04-23 DIAGNOSIS — E0590 Thyrotoxicosis, unspecified without thyrotoxic crisis or storm: Secondary | ICD-10-CM

## 2020-04-23 DIAGNOSIS — E0500 Thyrotoxicosis with diffuse goiter without thyrotoxic crisis or storm: Secondary | ICD-10-CM

## 2020-04-23 LAB — T4, FREE: Free T4: 0.75 ng/dL (ref 0.60–1.60)

## 2020-04-23 LAB — T3, FREE: T3, Free: 3.5 pg/mL (ref 2.3–4.2)

## 2020-04-23 LAB — TSH: TSH: 0.73 u[IU]/mL (ref 0.35–4.50)

## 2020-04-23 NOTE — Patient Instructions (Signed)
Please stop at the lab.  Please continue: - Methimazole 5 mg 2x a day with meals - Toprol XL 25 mg in am and 50 mg at night  Please come back for a follow-up appointment in 4 months.

## 2020-04-23 NOTE — Progress Notes (Signed)
Patient ID: Haley [name], female   DOB: 08-28-1966, 54 y.o.   MRN: [id number]   This visit occurred during the SARS-CoV-2 public health emergency.  Safety protocols were in place, including screening questions prior to the visit, additional usage of staff PPE, and extensive cleaning of exam room while observing appropriate contact time as indicated for disinfecting solutions.   HPI  Haley [name] is a 54 y.o.-year-old female, initially referred by her PCP, Haley Pounds, NP, returning for follow-up for thyrotoxicosis, diagnosed as Graves' disease at last visit and also large goiter.  Last visit 3 months ago.  Reviewed and addended history: Patient has a history of thyrotoxicosis since 2010. At that time she was hospitalized with pericarditis and Acute heart failure.   After diagnosis, she was started on a medication (? MMI) >> she could not tolerate 2/2 extreme fatigue >> changed to another medication (? PTU).  Tests normalized afterwards and she was taken off the medication.    However, she again started to have symptoms: Weakness, shortness of breath.  The TSH was rechecked and this was low while her free T4 was high.   She was started on methimazole and Toprol.  When I last saw her, her TSH returned elevated and a free T4 returned low.  We decreased her methimazole and Toprol.  She contacted me after last visit that she had insomnia and we increased her metoprolol to the previous dose, but gave her the higher dose at night.  She is treated with: - Methimazole 5 mg 3 times a day  >> 5 mg 2x a day - Toprol-XL 50 mg daily + 25 mg at night >> 50 mg daily >> 25 mg in am and 50 mg at night  She now feels better on these medicines.  I reviewed her TFTs: Lab Results  Component Value Date   TSH 4.51 (H) 01/24/2020   TSH 0.010 (L) 12/21/2019   TSH 1.624 01/24/2019   TSH 1.015 01/02/2019   TSH 1.47 10/11/2016   TSH 0.007  03/14/2009   Lab Results  Component Value Date    FREET4 0.45 (L) 01/24/2020   FREET4 1.33 (H) 12/22/2019   No components found for: FREET3   Her TSI antibodies were elevated: Lab Results  Component Value Date   TSI 658 (H) 01/24/2020    She also has a large goiter with substernal extension.  Thyroid ultrasound (05/08/2019): Enlarged thyroid, no nodules. Inferior margin of the left thyroid lobe is difficult to visualize due to the substernal extension.  CXR (12/21/2019): Per review of the images, trachea is slightly deviated to the right  CT chest (12/22/2019): Stable, large goiter, extending into the substernal space  At last visit, she complained of: - weight gain, however, she does have weight loss per review of the chart, approximately 7 [name] net in the last month - Heat intolerance-chronic - Poor sleep - Shortness of breath - Leg swelling - Hair loss  At this visit, SOB is still present (she feels this is related to deconditioning), still has heat intolerance, but improved sleep, improved hair loss.  Pt denies: - feeling nodules in neck - hoarseness - dysphagia - choking - SOB with lying down She had some cough lately.  Pt does have a FH of thyroid ds.: M aunt and M cousin.No FH of thyroid cancer. No h/o radiation tx to head or neck.  No herbal supplements. No Biotin use. No recent steroids use.  She has not been on amiodarone  before.  Patient also has CHF, and also has severe OSA - just started a CPAP 12/2019 >> feels better.  Before last visit, she started exercising: Stationary bike, walking, chair aerobics, weightlifting.  ROS: Constitutional: + weight gain/no weight loss, no fatigue, + subjective hyperthermia, no subjective hypothermia Eyes: no blurry vision, no xerophthalmia ENT: no sore throat, + see HPI Cardiovascular: no CP/+ SOB/no palpitations/+leg swelling  Respiratory: no cough/+ SOB/no wheezing Gastrointestinal: no N/no V/no D/no C/no acid reflux Musculoskeletal: no muscle aches/+ joint  aches Skin: no rashes, + improved hair loss Neurological: no tremors/no numbness/no tingling/no dizziness  I reviewed pt's medications, allergies, PMH, social hx, family hx, and changes were documented in the history of present illness. Otherwise, unchanged from my initial visit note.  Past Medical History:  Diagnosis Date  . Essential hypertension   . Fibroids   . Hyperthyroidism    a. 2010, treated w/ tapazole.  . Morbid obesity ([place])   . Paroxysmal atrial fibrillation ([place])    a. 03/2009 - in setting of hyperthyroidism and caffeine intake, converted spontaneously;  b. 08/2016 ED visit for recurrent PAF-->successful DCCV in ED.  Haley [name]   . Type II diabetes mellitus ([place])    Past Surgical History:  Procedure Laterality Date  . UTERINE FIBROID SURGERY     Social History   Socioeconomic History  . Marital status: Married    Spouse name: Not on file  . Number of children: 0  . Years of education: Not on file  . Highest education level: Not on file  Occupational History  . Occupation:  Charity fundraiser, Haematologist  Tobacco Use  . Smoking status: Former Smoker, quit in 2018    Packs/day: 1.00    Years: 32.00    Pack years: 32.00    Quit date: 2012    Years since quitting: 9.1  . Smokeless tobacco: Never Used  . Tobacco comment: patient vapes  Substance and Sexual Activity  . Alcohol use: No    Comment: Wine, 2-3 glasses, every 3 weeks  . Drug use: No  Social History Narrative   Lives in [place] with husband.  Systems analyst.  Also in school @ [employer] for [employer].     Social Determinants of Health   Financial Resource Strain:   . Difficulty of Paying Living Expenses: Not on file  Food Insecurity:   . Worried About Charity fundraiser in the Last Year: Not on file  . Ran Out of Food in the Last Year: Not on file  Transportation Needs:   . Lack of Transportation (Medical): Not on file  . Lack of Transportation (Non-Medical): Not on file  Physical Activity:    . Days of Exercise per Week: Not on file  . Minutes of Exercise per Session: Not on file  Stress:   . Feeling of Stress : Not on file  Social Connections:   . Frequency of Communication with Friends and Family: Not on file  . Frequency of Social Gatherings with Friends and Family: Not on file  . Attends Religious Services: Not on file  . Active Member of Clubs or Organizations: Not on file  . Attends Archivist Meetings: Not on file  . Marital Status: Not on file  Intimate Partner Violence:   . Fear of Current or Ex-Partner: Not on file  . Emotionally Abused: Not on file  . Physically Abused: Not on file  . Sexually Abused: Not on file   Current Outpatient Medications on File  Prior to Visit  Medication Sig Dispense Refill  . acetaminophen (TYLENOL 8 HOUR ARTHRITIS PAIN) 650 MG CR tablet Take 650 mg by mouth every 8 (eight) hours as needed for pain.    Haley Kitchen apixaban (ELIQUIS) 5 MG TABS tablet TAKE 1 TABLET (5 MG TOTAL) BY MOUTH 2 (TWO) TIMES DAILY. 60 tablet 3  . atorvastatin (LIPITOR) 20 MG tablet Take 1 tablet (20 mg total) by mouth daily. 90 tablet 3  . Blood Glucose Monitoring Suppl (TRUE METRIX METER) w/Device KIT Use twice daily 1 kit 0  . Dulaglutide (TRULICITY) 1.5 EP/3.2RJ SOPN Inject 1.5 mg into the skin once a week. 2 mL 2  . furosemide (LASIX) 20 MG tablet Take 1 tablet (20 mg total) by mouth as needed for edema. 30 tablet 2  . glipiZIDE (GLUCOTROL) 10 MG tablet Take 1 tablet (10 mg total) by mouth 2 (two) times daily before a meal. 180 tablet 1  . glucose blood (TRUE METRIX BLOOD GLUCOSE TEST) test strip Use as instructed. Check blood glucose level by fingerstick twice per day. 200 each 12  . Insulin Pen Needle (B-D UF III MINI PEN NEEDLES) 31G X 5 MM MISC Use as instructed. Inject into the skin once nightly. 100 each 6  . lisinopril (ZESTRIL) 2.5 MG tablet Take 2 tablets (5 mg total) by mouth daily. 180 tablet 1  . metFORMIN (GLUCOPHAGE-XR) 500 MG 24 hr tablet Take  4 tablets (2,000 mg total) by mouth at bedtime. 360 tablet 1  . methimazole (TAPAZOLE) 5 MG tablet Take 1 tablet (5 mg total) by mouth 2 (two) times daily. 180 tablet 1  . Methylsulfonylmethane (MSM PO) Take 2 tablets by mouth daily.    . metoprolol succinate (TOPROL-XL) 50 MG 24 hr tablet Take 1 tablet (50 mg total) by mouth daily. 90 tablet 1  . Multiple Vitamins-Minerals (ALIVE WOMENS 50+) TABS Take 1 tablet by mouth daily.    . TRUEplus Lancets 28G MISC Use twice daily for blood glucose check 200 each 6   No current facility-administered medications on file prior to visit.   No Known Allergies Family History  Problem Relation Age of Onset  . Hypertension Mother   . Diabetes Mellitus II Sister   . Cancer Maternal Grandmother     PE: BP 130/70   Pulse 88   Ht _0  (1.702 m)   Wt 294 lb (133.4 kg)   SpO2 98%   BMI 46.05 kg/m  Wt Readings from Last 3 Encounters:  04/23/20 294 lb (133.4 kg)  03/24/20 297 lb (134.7 kg)  01/24/20 293 lb (132.9 kg)   Constitutional: overweight, in NAD Eyes: PERRLA, EOMI, no exophthalmos, no lid lag, no stare ENT: moist mucous membranes, no palpable thyromegaly, no cervical lymphadenopathy Cardiovascular: RRR, No MRG, + mild bilateral lower extremity edema, pitting Respiratory: CTA B Gastrointestinal: abdomen soft, NT, ND, BS+ Musculoskeletal: no deformities, strength intact in all 4 Skin: moist, warm, no rashes.  Wears a wig. Neurological: no tremor with outstretched hands, DTR normal in all 4  ASSESSMENT: 1. Thyrotoxicosis  2.  Goiter  PLAN:  1. Patient with biochemical thyrotoxicosis and thyrotoxic symptoms: Weight loss, heat intolerance (chronic, though), shortness of breath, weakness.  She was started on methimazole 5 mg 3 times a day before our last visit.  Her symptoms improved. -At last visit, TSI antibodies were elevated giving her a diagnosis of Graves' disease. -We again discussed about possible modalities of treatment for the  above conditions, to include methimazole use, radioactive  iodine treatment, or surgery.  She is currently on methimazole, to which she responded well (her TSH was actually slightly high and free T4 slightly low) at last visit so we reduced the dose to 5 mg twice a day. -At last visit we tried to reduce the Toprol-XL from 75 mg daily to 50 mg daily.  However, she had trouble sleeping and we increased the dose back.  I advised her to take 25 mg in a.m. and 50 mg at night. -No signs of Graves' ophthalmopathy: she does not have any double vision, blurry vision, eye pain, chemosis -I again advised her to join my chart to communicate easier -I will see her back in 4 months  2.  Goiter -This is large and extending in the mediastinum -On the chest x-ray from 12/20/2019, trachea was slightly deviated to the right -However, she has no significant neck compression symptoms.  At this visit, she does complain of occasional saliva catching in throat and also some cough.  However none of these are bothersome. -We again discussed about the possible need for surgery for such a large goiter, especially in the setting of thyrotoxicosis.  At last visit, she just started on a CPAP for sleep apnea and she was feeling much better.  We decided to only follow her clinically for now.  She agrees.  She may benefit from hemi or total thyroidectomy in the future.  We will reevaluate for this at next visit.  Office Visit on 04/23/2020  Component Date Value Ref Range Status  . T3, Free 04/23/2020 3.5  2.3 - 4.2 pg/mL Final  . Free T4 04/23/2020 0.75  0.60 - 1.60 ng/dL Final   Comment: Specimens from patients who are undergoing biotin therapy and /or ingesting biotin supplements may contain high levels of biotin.  The higher biotin concentration in these specimens interferes with this Free T4 assay.  Specimens that contain high levels  of biotin may cause false high results for this Free T4 assay.  Please interpret results in light  of the total clinical presentation of the patient.    Haley Kitchen TSH 04/23/2020 0.73  0.35 - 4.50 uIU/mL Final   TFTs are normal.  We will continue the current dose of methimazole and recheck her tests at next visit.  Philemon Kingdom, MD PhD [facility]ocr[facility]

## 2020-04-23 NOTE — Telephone Encounter (Signed)
-----   Message from Philemon Kingdom, MD sent at 04/23/2020  4:43 PM EDT ----- Lenna Sciara, can you please call pt: TFTs are excellent.  Please continue the current dose of methimazole and we will repeat the tests at next visit.

## 2020-04-24 NOTE — Telephone Encounter (Signed)
Notified patient.

## 2020-04-28 ENCOUNTER — Other Ambulatory Visit: Payer: Self-pay

## 2020-04-28 ENCOUNTER — Ambulatory Visit (INDEPENDENT_AMBULATORY_CARE_PROVIDER_SITE_OTHER): Payer: Self-pay | Admitting: Cardiovascular Disease

## 2020-04-28 ENCOUNTER — Encounter: Payer: Self-pay | Admitting: Cardiovascular Disease

## 2020-04-28 VITALS — BP 126/74 | HR 84 | Temp 97.1°F | Ht 67.0 in | Wt 292.0 lb

## 2020-04-28 DIAGNOSIS — E0590 Thyrotoxicosis, unspecified without thyrotoxic crisis or storm: Secondary | ICD-10-CM

## 2020-04-28 DIAGNOSIS — G4733 Obstructive sleep apnea (adult) (pediatric): Secondary | ICD-10-CM

## 2020-04-28 DIAGNOSIS — E0500 Thyrotoxicosis with diffuse goiter without thyrotoxic crisis or storm: Secondary | ICD-10-CM

## 2020-04-28 DIAGNOSIS — E119 Type 2 diabetes mellitus without complications: Secondary | ICD-10-CM

## 2020-04-28 DIAGNOSIS — I480 Paroxysmal atrial fibrillation: Secondary | ICD-10-CM

## 2020-04-28 DIAGNOSIS — I10 Essential (primary) hypertension: Secondary | ICD-10-CM

## 2020-04-28 NOTE — Patient Instructions (Signed)
Medication Instructions:  CONTINUE WITH CURRENT MEDICATIONS. NO CHANGES.  *If you need a refill on your cardiac medications before your next appointment, please call your pharmacy*  Follow-Up: At [facility], you and your health needs are our priority.  As part of our continuing mission to provide you with exceptional heart care, we have created designated Provider Care Teams.  These Care Teams include your primary Cardiologist (physician) and Advanced Practice Providers (APPs -  Physician Assistants and Nurse Practitioners) who all work together to provide you with the care you need, when you need it.  We recommend signing up for the patient portal called "MyChart".  Sign up information is provided on this After Visit Summary.  MyChart is used to connect with patients for Virtual Visits (Telemedicine).  Patients are able to view lab/test results, encounter notes, upcoming appointments, etc.  Non-urgent messages can be sent to your provider as well.   To learn more about what you can do with MyChart, go to [email or web address].    Your next appointment:   4 month(s)  The format for your next appointment:   In Person  Provider:   Thomas Kelly, MD    

## 2020-04-28 NOTE — Progress Notes (Signed)
Cardiology Office Note    Date:  05/04/2020   ID:  Haley [name], DOB May 07, 1966, MRN [id number]  PCP:  Haley Pounds, NP  Cardiologist:  Haley Majestic, MD (sleep)  3 month evaluation initial follow-up after she had received a BiPAP unit  History of Present Illness:  Haley [name] is a 54 y.o. female who presents for a follow-up sleep  evaluation.  Haley [name] has a history of PAF, which initially occurred in the setting of hyperthyroidism in 2010.  She recently developed recurrent paroxysmal atrial fibrillation and underwent cardioversion in the ED in September 2017.  She has a history of type 2 diabetes mellitus, morbid obesity, hypertension, and she had recently seen Haley [name] in November 2017.  At that time, she was started on beta blocker therapy.  She was started on eliquis anticoagulation.  Due to concerns for obstructive sleep apnea.  She was referred for a sleep study which was done on 12/13/2016.  She had severe sleep apnea with an AHI of 111.2 per hour, an RDI of 113.  AHI during REM sleep was 74.2.  She had severe oxygen desaturation to 51%.  She underwent a CPAP, and ultimate BiPAP titration on 01/04/2017.  On her titration study CPAP was advanced to 19 cm water pressure but due to continued events, BiPAP was implemented and she required maximum titration up to 25/21.  At 23/19, AHI was still elevated 6.4, but 25/21 at AHI was 0.  Apparently, the patient had concerns about cost since she currently does not have insurance.  She was wondering about using a CPAP from a family member since he no longer uses treatment.  I was concerned that CPAP may not be as beneficial.  When I saw her in July 2018, she  brought the machine with her to the office today for our review and assessment.  She was given a ResMed S9 and had a  full face mas given to her the time of first titration study.  At that time she was going  to bed around 1 AM and waking up for good around noon.   She had frequent awakenings during the night and daytime sleepiness.  Epworth Sleepiness Scale: Situation   Chance of Dozing/Sleeping (0 = never , 1 = slight chance , 2 = moderate chance , 3 = high chance )   sitting and reading 2   watching TV 2   sitting inactive in a public place 2   being a passenger in a motor vehicle for an hour or more 1   lying down in the afternoon 2   sitting and talking to someone 1   sitting quietly after lunch (no alcohol) 2   while stopped for a few minutes in traffic as the driver 2   Total Score  14   When I saw her, I recommended that her CPAP unit  adjusted to it 20 cm maximum pressure.  We had set up the process through assisted support for her to try to get a BiPAP machine.  She did reach out to [facility] since this was the DME company that had supplied her friend with the CPAP unit that she was using.  She has felt improved since using CPAP therapy.  However, she still admits to fatigue.  She feels  that in the middle of night she was not getting enough air and for this reason , was taking her mask off when she felt the sensation.  When I saw her in March 2020 he had not received a BiPAP machine she did not bring the CPAP of her friend who she was using for interrogation.  When I  saw her in August 2020, she was still having issues with sleep and admitted to no energy, poor sleep, snoring, and nocturia at least 3 times per night.  She is now working doing senior sitting.  She still does not have insurance.  She is unaware of any recurrent or fibrillation and continued to be on Eliquis for anticoagulation in addition to metoprolol succinate 50 mg in the morning and 25 mg in the evening.  She is diabetic on Trulicity in addition to metformin and glipizide.  She is on atorvastatin 20 mg daily for hyperlipidemia.  She was recently admitted to hospital in January 2021 with acute hypoxic respiratory failure secondary to acute diastolic CHF contributed by  untreated sleep apnea.  CTA was negative for PE although she had small airway disease.  Lower extremity Dopplers were negative for DVT.  An echo Doppler study revealed an EF of 60 to 65%, moderate LVH without wall motion abnormality.  She was treated with a 6.  She was maintaining sinus rhythm on beta-blocker and Eliquis.  Prior to her hospitalization she was not using her CPAP therapy.  Care management was consulted instantly with help from the heart and vascular patient find she ultimately was able to receive a new ResMed air curve BiPAP unit with set up date December 27, 2019.    I saw her in the office on January 16, 2020 for follow-up evaluation.  During that in office evaluation I was unable to obtain data since she had not been linked to our office.  However subsequently we were able to get her BiPAP unit linked  to our office.  Since her set up date of December 27, 2019 she has used it all days with the exception of 1.  Her EPAP pressure is set at 10 with IPAP pressure of 18.  AHI is 4.4 with a central index of 1.4.  She has felt significantly improved since reinitiating therapy and feels significantly better with compared to her prior CPAP.  She is unaware of any breakthrough snoring.  She is also having oxygen supplementation through her BiPAP unit.  She is try to make dietary adjustments with her morbid obesity.  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  Over the last several months, she has felt well.  I obtained a download from March 29, 2020 through Apr 27, 2020.  Compliance is suboptimal with only 60% of usage days.  However this is explained by the fact that her husband was in the hospital for several weeks and she was sleeping in the hospital in a recliner and not at home with her BiPAP unit.  Pressures are set at an EPAP of 10 and IPAP of 19 cm of water.  AHI is 2.7.  A new Epworth Sleepiness Scale score was calculated in the office today and this endorsed at 10.   He presents for evaluation.  Past Medical History:  Diagnosis Date  . Essential hypertension   . Fibroids   . Hyperthyroidism    a. 2010, treated w/ tapazole.  . Morbid obesity ([place])   . Paroxysmal atrial fibrillation ([place])    a. 03/2009 - in setting of hyperthyroidism and caffeine intake, converted spontaneously;  b. 08/2016 ED visit for recurrent PAF-->successful DCCV in ED.  [name]  Snores   . Type II diabetes mellitus ([place])     Past Surgical History:  Procedure Laterality Date  . UTERINE FIBROID SURGERY      Current Medications: Outpatient Medications Prior to Visit  Medication Sig Dispense Refill  . acetaminophen (TYLENOL 8 HOUR ARTHRITIS PAIN) 650 MG CR tablet Take 650 mg by mouth every 8 (eight) hours as needed for pain.    [name] apixaban (ELIQUIS) 5 MG TABS tablet TAKE 1 TABLET (5 MG TOTAL) BY MOUTH 2 (TWO) TIMES DAILY. 60 tablet 3  . atorvastatin (LIPITOR) 20 MG tablet Take 1 tablet (20 mg total) by mouth daily. 90 tablet 3  . Blood Glucose Monitoring Suppl (TRUE METRIX METER) w/Device KIT Use twice daily 1 kit 0  . Dulaglutide (TRULICITY) 1.5 DJ/2.4QA SOPN Inject 1.5 mg into the skin once a week. 2 mL 2  . furosemide (LASIX) 20 MG tablet Take 1 tablet (20 mg total) by mouth as needed for edema. 30 tablet 2  . glipiZIDE (GLUCOTROL) 10 MG tablet Take 1 tablet (10 mg total) by mouth 2 (two) times daily before a meal. 180 tablet 1  . glucose blood (TRUE METRIX BLOOD GLUCOSE TEST) test strip Use as instructed. Check blood glucose level by fingerstick twice per day. 200 each 12  . Insulin Pen Needle (B-D UF III MINI PEN NEEDLES) 31G X 5 MM MISC Use as instructed. Inject into the skin once nightly. 100 each 6  . lisinopril (ZESTRIL) 2.5 MG tablet Take 2 tablets (5 mg total) by mouth daily. 180 tablet 1  . metFORMIN (GLUCOPHAGE-XR) 500 MG 24 hr tablet Take 4 tablets (2,000 mg total) by mouth at bedtime. 360 tablet 1  . methimazole (TAPAZOLE) 5 MG tablet Take 1 tablet (5 mg total) by mouth 2  (two) times daily. 180 tablet 1  . Methylsulfonylmethane (MSM PO) Take 2 tablets by mouth daily.    . metoprolol succinate (TOPROL-XL) 50 MG 24 hr tablet Take 1 tablet (50 mg total) by mouth daily. 90 tablet 1  . Multiple Vitamins-Minerals (ALIVE WOMENS 50+) TABS Take 1 tablet by mouth daily.    . TRUEplus Lancets 28G MISC Use twice daily for blood glucose check 200 each 6   No facility-administered medications prior to visit.     Allergies:   Patient has no known allergies.   Social History   Socioeconomic History  . Marital status: Married    Spouse name: Not on file  . Number of children: Not on file  . Years of education: Not on file  . Highest education level: Not on file  Occupational History  . Occupation: cosmetologist  Tobacco Use  . Smoking status: Former Smoker    Packs/day: 1.00    Years: 32.00    Pack years: 32.00    Quit date: 2012    Years since quitting: 9.4  . Smokeless tobacco: Never Used  . Tobacco comment: patient vapes  Substance and Sexual Activity  . Alcohol use: No    Comment: occasional drink.  . Drug use: No  . Sexual activity: Yes  Other Topics Concern  . Not on file  Social History Narrative   Lives in [place] with husband.  Systems analyst.  Also in school @ [employer] for [employer].  Does not routinely exercise.   Social Determinants of Health   Financial Resource Strain:   . Difficulty of Paying Living Expenses:   Food Insecurity:   . Worried About Charity fundraiser in the Last Year:   .  Ran Out of Food in the Last Year:   Transportation Needs:   . Film/video editor (Medical):   [name] Lack of Transportation (Non-Medical):   Physical Activity:   . Days of Exercise per Week:   . Minutes of Exercise per Session:   Stress:   . Feeling of Stress :   Social Connections:   . Frequency of Communication with Friends and Family:   . Frequency of Social Gatherings with Friends and Family:   . Attends Religious Services:   . Active  Member of Clubs or Organizations:   . Attends Archivist Meetings:   [name] Marital Status:      Family History:  The patient's family history includes Cancer in her maternal grandmother; Diabetes Mellitus II in her sister; Hypertension in her mother.  Family history is notable in that her uncle  had sleep apnea but has not utilized treatment and he gave her his machine.  ROS General: Negative; No fevers, chills, or night sweats; morbid obesity HEENT: Negative; No changes in vision or hearing, sinus congestion, difficulty swallowing Pulmonary: Negative; No cough, wheezing, shortness of breath, hemoptysis Cardiovascular: History of PAF GI: Negative; No nausea, vomiting, diarrhea, or abdominal pain GU: Negative; No dysuria, hematuria, or difficulty voiding Musculoskeletal: Negative; no myalgias, joint pain, or weakness Hematologic/Oncology: Negative; no easy bruising, bleeding Endocrine: Positive for diabetes mellitus Neuro: Negative; no changes in balance, headaches Skin: Negative; No rashes or skin lesions Psychiatric: Negative; No behavioral problems, depression Sleep: Positive for severe sleep apnea; snoring, daytime sleepiness, hypersomnolence;  No bruxism, restless legs, hypnogognic hallucinations, no cataplexy Other comprehensive 14 point system review is negative.   PHYSICAL EXAM:   VS:  BP 126/74   Pulse 84   Temp (!) 97.1 F (36.2 C)   Ht '5\' 7"'$  (1.702 m)   Wt 292 lb (132.5 kg)   SpO2 98%   BMI 45.73 kg/m     Wt Readings from Last 3 Encounters:  04/28/20 292 lb (132.5 kg)  04/23/20 294 lb (133.4 kg)  03/24/20 297 lb (134.7 kg)    General: Alert, oriented, no distress.  Morbid obesity Skin: normal turgor, no rashes, warm and dry HEENT: Normocephalic, atraumatic. Pupils equal round and reactive to light; sclera anicteric; extraocular muscles intact;  Nose without nasal septal hypertrophy Mouth/Parynx benign; Mallinpatti scale 4 Neck: Thick neck; palpable goiter;  no JVD, no carotid bruits; normal carotid upstroke Lungs: clear to ausculatation and percussion; no wheezing or rales Chest wall: without tenderness to palpitation Heart: PMI not displaced, RRR, s1 s2 normal, 1/6 systolic murmur, no diastolic murmur, no rubs, gallops, thrills, or heaves Abdomen: Significant central adiposity soft, nontender; no hepatosplenomehaly, BS+; abdominal aorta nontender and not dilated by palpation. Back: no CVA tenderness Pulses 2+ Musculoskeletal: full range of motion, normal strength, no joint deformities Extremities: no clubbing cyanosis or edema, Homan's sign negative  Neurologic: grossly nonfocal; Cranial nerves grossly wnl Psychologic: Normal mood and affect   Studies/Labs Reviewed:   ECG (independently read by me): NSR at 84; no ectopy; normal intervals  I personally reviewed most recent ECG from December 22, 2019 which showed sinus rhythm at 69 bpm.  There were nonspecific ST changes.  August 05, 2019 ECG (independently read by me): Normal sinus rhythm at 83 bpm.  Probable left atrial enlargement.  QTc interval 432 ms PR interval 146 ms  ECG (independently read by me): Normal sinus rhythm at 84 bpm.  Normal intervals.  No ectopy  October 2018 ECG (independently read  by me): Normal sinus rhythm at 73 bpm.  No ectopy.  Normal intervals.  Recent Labs: BMP Latest Ref Rng & Units 03/24/2020 12/24/2019 12/23/2019  Glucose 65 - 99 mg/dL 95 184(H) 158(H)  BUN 6 - 24 mg/dL '10 12 10  '$ Creatinine 0.57 - 1.00 mg/dL 0.57 0.51 0.47  BUN/Creat Ratio 9 - 23 18 - -  Sodium 134 - 144 mmol/L 144 140 142  Potassium 3.5 - 5.2 mmol/L 4.0 4.1 4.2  Chloride 96 - 106 mmol/L 102 99 99  CO2 20 - 29 mmol/L 25 32 34(H)  Calcium 8.7 - 10.2 mg/dL 9.7 8.8(L) 9.0     Hepatic Function Latest Ref Rng & Units 03/24/2020 02/19/2019 03/21/2018  Total Protein 6.0 - 8.5 g/dL 7.4 7.3 8.1  Albumin 3.8 - 4.9 g/dL 4.6 4.5 4.3  AST 0 - 40 IU/L '19 14 23  '$ ALT 0 - 32 IU/L '15 13 20  '$ Alk  Phosphatase 39 - 117 IU/L 62 58 53  Total Bilirubin 0.0 - 1.2 mg/dL 0.3 0.3 0.6    CBC Latest Ref Rng & Units 12/21/2019 02/19/2019 01/25/2019  WBC 4.0 - 10.5 K/uL 9.9 7.7 8.0  Hemoglobin 12.0 - 15.0 g/dL 13.7 14.1 13.3  Hematocrit 36.0 - 46.0 % 45.6 42.6 43.5  Platelets 150 - 400 K/uL 246 244 244   Lab Results  Component Value Date   MCV 95.6 12/21/2019   MCV 92 02/19/2019   MCV 94.4 01/25/2019   Lab Results  Component Value Date   TSH 0.73 04/23/2020   Lab Results  Component Value Date   HGBA1C 7.4 (H) 03/24/2020     BNP    Component Value Date/Time   BNP 205.8 (H) 12/21/2019 1427    ProBNP No results found for: PROBNP   Lipid Panel     Component Value Date/Time   CHOL 153 09/06/2019 1703   TRIG 116 09/06/2019 1703   HDL 50 09/06/2019 1703   CHOLHDL 3.1 09/06/2019 1703   CHOLHDL 3.5 01/25/2019 0850   VLDL 19 01/25/2019 0850   LDLCALC 82 09/06/2019 1703     RADIOLOGY: No results found.   Additional studies/ records that were reviewed today include:  I have reviewed the patient's prior ER evaluation for cardioversion, A. fib clinic note, and evaluation by Ignacia Bayley, NP  I have reviewed her recent hospitalization from 1016 through December 23, 2019.   ASSESSMENT:    1. Essential hypertension   2. OSA (obstructive sleep apnea)   3. Morbid obesity ([place])   4. Hyperthyroidism   5. Graves disease   6. Paroxysmal atrial fibrillation ([facility])   7. Controlled type 2 diabetes mellitus without complication, without long-term current use of insulin [facility])     PLAN:  Ms. Jaxson Keener is a 54 year old female who history of morbid obesity, hypertension, paroxysmal atrial fibrillation, type 2 diabetes mellitus, and was diagnosed as having very severe sleep apnea.  She failed CPAP therapy and required maximum BiPAP therapy at 25/21 for optimal treatment.  She never instituted BiPAP therapy due to lack of insurance.  At a prior evaluation she was  borrowing a  friend's CPAP unit.  At that time she still had issues and was remaining sleepy felt as though she was not getting adequate air particularly in the middle of the night. She was admitted to the hospital in January 2021 with hypoxic respiratory failure felt secondary to acute diastolic heart failure.  She had not been using her CPAP and clearly was affected by  her untreated sleep apnea.  During that evaluation she was also started on methimazole hyperthyroidism with TSH at 0.010.  Fortunately, she was evaluated by care manager and through the generosity of the heart and vascular found as an outpatient she was able to receive a new ResMed air curve and BiPAP unit.  Her set up date was December 27, 2019.  When I saw her in February she was meeting compliance standards with reference to usage and has only had 1 day without usage.  She was set at a 18/10 pressure.  Her most recent download reveals an AHI of 4.4 and central apnea of 1.4.  When I saw him in the office I did not have have data regarding her therapy.  The following day I was able to have her BiPAP unit linked to our office interrogate her data.  Presently, I am recommending aging of her EPAP pressure and instead of starting at 4 she will start at 6.  She will continue with a 10-minute ramp time.  I slightly increasing her IPAP pressure and pressure support such that her pressure will be 19/10.  I will obtain a new download in 4 weeks.  Over the past month, her husband had become ill and was in the hospital for over a week.  She had used CPAP therapy prior to his hospitalization but when he was in the hospital she was staying at the hospital sleeping in a recliner and not using BiPAP therapy.  This explains her 60% usage days.  However, her average usage on days used was low at only 3 hours and 28 minutes.  Typically she goes to bed at 11:30 PM and wakes up at 7:45 PM.  I again discussed with her optimal use being at least 7 to 8 hours per night.  We again  discussed the ponderous of REM sleep occurs in the second half of the night and if she takes her machine off when she needs it the most she is not being treated.  Her AHI is improved with her 19/10 BiPAP settings.  She does not have significant leak.  Since initiating BiPAP therapy she does feel more rested.  She is not waking up with significant nocturia that as she had previously.  She continues to be on atorvastatin for hyperlipidemia with target LDL less than 70 in this diabetic female.  She is on Trulicity, Metformin and glipizide.  She has recent thyrotoxicosis, diagnosed as Graves' disease with large goiter followed by Dr. Beverly Sessions.  I will see her in 4 months for reevaluation   Haley Majestic, MD

## 2020-05-04 ENCOUNTER — Encounter: Payer: Self-pay | Admitting: Cardiovascular Disease

## 2020-05-06 MED FILL — METOPROLOL SUCCINATE ER 50: 50 | 30 days supply | Qty: 30 | Fill #1

## 2020-05-22 MED FILL — $ELIQUIS 5 MG TABLET: 5 | 90 days supply | Qty: 180 | Fill #1

## 2020-05-25 NOTE — Telephone Encounter (Signed)
Tried to call patient to see if her concerns regarding CPAP had been answered.  Her voicemail box is unable to take messages.

## 2020-06-01 ENCOUNTER — Telehealth: Payer: Self-pay | Admitting: Cardiovascular Disease

## 2020-06-01 NOTE — Telephone Encounter (Signed)
I attempted to schedule patient for follow up visit 06/01/20, patient didn't answer, unable to leave message, voicemail was full

## 2020-06-09 MED FILL — ATORVASTATIN CALCIUM 20 MG: 20 | 30 days supply | Qty: 30 | Fill #2

## 2020-06-09 MED FILL — LISINOPRIL 2.5 MG TABLET: 2.5 | 30 days supply | Qty: 60 | Fill #2

## 2020-06-09 MED FILL — glipiZIDE 10 MG TABS: 10 | 30 days supply | Qty: 60 | Fill #2

## 2020-06-09 MED FILL — metFORMIN HCL ER 500 MG TB2: 500 | 30 days supply | Qty: 120 | Fill #3

## 2020-06-09 MED FILL — METOPROLOL SUCCINATE ER 50: 50 | 30 days supply | Qty: 30 | Fill #2

## 2020-06-22 ENCOUNTER — Ambulatory Visit: Payer: Self-pay | Admitting: Nurse Practitioner

## 2020-07-01 ENCOUNTER — Ambulatory Visit: Payer: Self-pay | Attending: Nurse Practitioner | Admitting: Nurse Practitioner

## 2020-07-01 ENCOUNTER — Encounter: Payer: Self-pay | Admitting: Nurse Practitioner

## 2020-07-01 ENCOUNTER — Other Ambulatory Visit: Payer: Self-pay | Admitting: Nurse Practitioner

## 2020-07-01 ENCOUNTER — Other Ambulatory Visit: Payer: Self-pay

## 2020-07-01 DIAGNOSIS — E1169 Type 2 diabetes mellitus with other specified complication: Secondary | ICD-10-CM

## 2020-07-01 MED ORDER — LISINOPRIL 2.5 MG PO TABS: 5.0000 mg | ORAL_TABLET | Freq: Every day | ORAL | 1 refills | Status: DC

## 2020-07-01 MED ORDER — GLIPIZIDE 10 MG PO TABS: 10.0000 mg | ORAL_TABLET | Freq: Two times a day (BID) | ORAL | 1 refills | Status: DC

## 2020-07-01 MED ORDER — GABAPENTIN 100 MG PO CAPS: 100.0000 mg | ORAL_CAPSULE | Freq: Every day | ORAL | 3 refills | Status: DC

## 2020-07-01 MED ORDER — METFORMIN HCL ER 500 MG PO TB24: 2000.0000 mg | ORAL_TABLET | Freq: Every day | ORAL | 1 refills | Status: DC

## 2020-07-01 NOTE — Progress Notes (Signed)
Virtual Visit via Telephone Note Due to national recommendations of social distancing due to [place] 19, telehealth visit is felt to be most appropriate for this patient at this time.  I discussed the limitations, risks, security and privacy concerns of performing an evaluation and management service by telephone and the availability of in person appointments. I also discussed with the patient that there may be a patient responsible charge related to this service. The patient expressed understanding and agreed to proceed.    I connected with Haley [name] on 07/01/20  at   4:10 PM EDT  EDT by telephone and verified that I am speaking with the correct person using two identifiers.   Consent I discussed the limitations, risks, security and privacy concerns of performing an evaluation and management service by telephone and the availability of in person appointments. I also discussed with the patient that there may be a patient responsible charge related to this service. The patient expressed understanding and agreed to proceed.   Location of Patient: Private Residence   Location of Provider: [place] [facility] [employer] Office    Persons participating in Telemedicine visit: Geryl Rankins FNP-BC [place]    History of Present Illness: Telemedicine visit for: Follow Up  has a past medical history of Essential hypertension, Fibroids, Hyperthyroidism, Morbid obesity ([place]), Paroxysmal atrial fibrillation ([place]), Snores, and Type II diabetes mellitus ([place]).  Patient has been counseled on age-appropriate routine health concerns for screening and prevention. These are reviewed and up-to-date. Referrals have been placed accordingly. Immunizations are up-to-date or declined.     Has concerns of right Hip pain, neuropathy and skin discoloration Also endorses intermittent numbness and tingling in her bilateral hands and  feet. BMI 46. Notes joint pain in both ankles.  Doing home exercises which seems to provide some relief of her symptoms. Denies any injury, falls or trauma. She does have OTC orthotic inserts for her shoes that she wears as she stands for long periods of time at work.  She is also concerned that there is skin discoloration near the bilateral medial malleolus. She does have a history of diabetes. Will start gabapentin for neuropathy symptoms today.   DM Type 2 She lost her glucometer and has not been monitoring her blood glucose levels at home. Denies consistent dietary adherence or exercise. A1C has improved from 7.4 to 7.1. She endorses medication adherence taking glipizide 10 mg BID, metformin XR 2000 mg daily and trulicity 1.5 mg weekly. On renal dose ACe and low dose statin.  Lab Results  Component Value Date   HGBA1C 7.1 (H) 07/02/2020   Lab Results  Component Value Date   LDLCALC 66 07/02/2020   BP Readings from Last 3 Encounters:  04/28/20 126/74  04/23/20 130/70  03/24/20 116/72    Past Medical History:  Diagnosis Date  . Essential hypertension   . Fibroids   . Hyperthyroidism    a. 2010, treated w/ tapazole.  . Morbid obesity ([place])   . Paroxysmal atrial fibrillation ([place])    a. 03/2009 - in setting of hyperthyroidism and caffeine intake, converted spontaneously;  b. 08/2016 ED visit for recurrent PAF-->successful DCCV in ED.  [name] Snores   . Type II diabetes mellitus ([place])     Past Surgical History:  Procedure Laterality Date  . UTERINE FIBROID SURGERY      Family History  Problem Relation Age of Onset  . Hypertension Mother   . Diabetes Mellitus II Sister   .  Cancer Maternal Grandmother     Social History   Socioeconomic History  . Marital status: Married    Spouse name: Not on file  . Number of children: Not on file  . Years of education: Not on file  . Highest education level: Not on file  Occupational History  . Occupation: cosmetologist  Tobacco Use  . Smoking status: Former Smoker    Packs/day: 1.00     Years: 32.00    Pack years: 32.00    Quit date: 2012    Years since quitting: 9.5  . Smokeless tobacco: Never Used  . Tobacco comment: patient vapes  Vaping Use  . Vaping Use: Never used  Substance and Sexual Activity  . Alcohol use: No    Comment: occasional drink.  . Drug use: No  . Sexual activity: Yes  Other Topics Concern  . Not on file  Social History Narrative   Lives in [place] with husband.  Systems analyst.  Also in school @ [employer] for [employer].  Does not routinely exercise.   Social Determinants of Health   Financial Resource Strain:   . Difficulty of Paying Living Expenses:   Food Insecurity:   . Worried About Charity fundraiser in the Last Year:   . Arboriculturist in the Last Year:   Transportation Needs:   . Film/video editor (Medical):   [name] Lack of Transportation (Non-Medical):   Physical Activity:   . Days of Exercise per Week:   . Minutes of Exercise per Session:   Stress:   . Feeling of Stress :   Social Connections:   . Frequency of Communication with Friends and Family:   . Frequency of Social Gatherings with Friends and Family:   . Attends Religious Services:   . Active Member of Clubs or Organizations:   . Attends Archivist Meetings:   [name] Marital Status:      Observations/Objective: Awake, alert and oriented x 3   Review of Systems  Constitutional: Negative for fever, malaise/fatigue and weight loss.  HENT: Negative.  Negative for nosebleeds.   Eyes: Negative.  Negative for blurred vision, double vision and photophobia.  Respiratory: Negative.  Negative for cough and shortness of breath.   Cardiovascular: Negative.  Negative for chest pain, palpitations and leg swelling.  Gastrointestinal: Negative.  Negative for heartburn, nausea and vomiting.  Musculoskeletal: Negative.  Negative for myalgias.       SEE HPI  Neurological: Positive for tingling and sensory change. Negative for dizziness, focal weakness, seizures and  headaches.  Psychiatric/Behavioral: Negative.  Negative for suicidal ideas.    Assessment and Plan: Haley [name] was seen today for follow-up.  Diagnoses and all orders for this visit:  Type 2 diabetes mellitus with other specified complication, without long-term current use of insulin (HCC) -     metFORMIN (GLUCOPHAGE-XR) 500 MG 24 hr tablet; Take 4 tablets (2,000 mg total) by mouth at bedtime. -     lisinopril (ZESTRIL) 2.5 MG tablet; Take 2 tablets (5 mg total) by mouth daily. -     glipiZIDE (GLUCOTROL) 10 MG tablet; Take 1 tablet (10 mg total) by mouth 2 (two) times daily before a meal. -     gabapentin (NEURONTIN) 100 MG capsule; Take 1 capsule (100 mg total) by mouth at bedtime. -     Ambulatory referral to Ophthalmology Continue blood sugar control as discussed in office today, low carbohydrate diet, and regular physical exercise as tolerated, 150 minutes per  week (30 min each day, 5 days per week, or 50 min 3 days per week). Keep blood sugar logs with fasting goal of 90-130 mg/dl, post prandial (after you eat) less than 180.  For Hypoglycemia: BS <60 and Hyperglycemia BS >400; contact the clinic ASAP. Annual eye exams and foot exams are recommended.   Follow Up Instructions Return in about 3 months (around 10/01/2020).     I discussed the assessment and treatment plan with the patient. The patient was provided an opportunity to ask questions and all were answered. The patient agreed with the plan and demonstrated an understanding of the instructions.   The patient was advised to call back or seek an in-person evaluation if the symptoms worsen or if the condition fails to improve as anticipated.  I provided 20 minutes of non-face-to-face time during this encounter including median intraservice time, reviewing previous notes, labs, imaging, medications and explaining diagnosis and management.  [name], FNP-BC

## 2020-07-02 ENCOUNTER — Ambulatory Visit: Payer: Self-pay | Attending: Nurse Practitioner

## 2020-07-02 ENCOUNTER — Other Ambulatory Visit: Payer: Self-pay

## 2020-07-02 ENCOUNTER — Telehealth: Payer: Self-pay

## 2020-07-02 DIAGNOSIS — I10 Essential (primary) hypertension: Secondary | ICD-10-CM

## 2020-07-02 DIAGNOSIS — Z1159 Encounter for screening for other viral diseases: Secondary | ICD-10-CM

## 2020-07-02 DIAGNOSIS — E1169 Type 2 diabetes mellitus with other specified complication: Secondary | ICD-10-CM

## 2020-07-02 DIAGNOSIS — E7800 Pure hypercholesterolemia, unspecified: Secondary | ICD-10-CM

## 2020-07-02 MED FILL — GABAPENTIN 100 MG CAPSULE: 100 | 30 days supply | Qty: 30 | Fill #0

## 2020-07-02 MED FILL — LISINOPRIL 2.5 MG TABLET: 2.5 | 30 days supply | Qty: 60 | Fill #0

## 2020-07-02 MED FILL — glipiZIDE 10 MG TABS: 10 | 30 days supply | Qty: 60 | Fill #0

## 2020-07-02 MED FILL — metFORMIN HCL ER 500 MG TB2: 500 | 30 days supply | Qty: 120 | Fill #0

## 2020-07-02 NOTE — Telephone Encounter (Signed)
Telephoned patient at home number. Left a voice message with [facility] scheduling information. 

## 2020-07-03 ENCOUNTER — Encounter: Payer: Self-pay | Admitting: Nurse Practitioner

## 2020-07-03 LAB — LIPID PANEL
Chol/HDL Ratio: 2.5 ratio (ref 0.0–4.4)
Cholesterol, Total: 131 mg/dL (ref 100–199)
HDL: 53 mg/dL (ref >39)
LDL Chol Calc (NIH): 66 mg/dL (ref 0–99)
Triglycerides: 56 mg/dL (ref 0–149)
VLDL Cholesterol Cal: 12 mg/dL (ref 5–40)

## 2020-07-03 LAB — CMP14+EGFR
ALT: 17 IU/L (ref 0–32)
AST: 20 IU/L (ref 0–40)
Albumin/Globulin Ratio: 1.5 (ref 1.2–2.2)
Albumin: 4.8 g/dL (ref 3.8–4.9)
Alkaline Phosphatase: 68 IU/L (ref 48–121)
BUN/Creatinine Ratio: 23 (ref 9–23)
BUN: 14 mg/dL (ref 6–24)
Bilirubin Total: 0.4 mg/dL (ref 0.0–1.2)
CO2: 28 mmol/L (ref 20–29)
Calcium: 10.2 mg/dL (ref 8.7–10.2)
Chloride: 101 mmol/L (ref 96–106)
Creatinine, Ser: 0.61 mg/dL (ref 0.57–1.00)
GFR calc Af Amer: 119 mL/min/{1.73_m2} (ref >59)
GFR calc non Af Amer: 103 mL/min/{1.73_m2} (ref >59)
Globulin, Total: 3.1 g/dL (ref 1.5–4.5)
Glucose: 108 mg/dL — ABNORMAL HIGH (ref 65–99)
Potassium: 4.7 mmol/L (ref 3.5–5.2)
Sodium: 142 mmol/L (ref 134–144)
Total Protein: 7.9 g/dL (ref 6.0–8.5)

## 2020-07-03 LAB — HEMOGLOBIN A1C
Est. average glucose Bld gHb Est-mCnc: 157 mg/dL
Hgb A1c MFr Bld: 7.1 % — ABNORMAL HIGH (ref 4.8–5.6)

## 2020-07-03 LAB — HEPATITIS C ANTIBODY: Hep C Virus Ab: <0.1 s/co ratio (ref 0.0–0.9)

## 2020-07-10 ENCOUNTER — Telehealth: Payer: Self-pay | Admitting: Cardiovascular Disease

## 2020-07-10 NOTE — Telephone Encounter (Signed)
New message     Calling to get the status on a presc or CPAP supplies that was faxed on 06-24-20. Please fax presc to [phone]

## 2020-07-10 NOTE — Telephone Encounter (Signed)
Please advise, calling regarding CPAP supplies.  Thanks!

## 2020-07-13 MED FILL — $TRULICITY 1.5 MG/0.5 ML PE: 1.5 | 28 days supply | Qty: 2 | Fill #0

## 2020-07-13 MED FILL — METOPROLOL SUCCINATE ER 50: 50 | 30 days supply | Qty: 30 | Fill #3

## 2020-07-16 NOTE — Telephone Encounter (Signed)
Haley [name] is calling back due to never receiving confirmation it was received.

## 2020-07-20 MED FILL — ATORVASTATIN CALCIUM 20 MG: 20 | 30 days supply | Qty: 30 | Fill #3

## 2020-07-20 NOTE — Telephone Encounter (Signed)
Roderic Palau with [facility] is calling to follow up. He states he will resubmit fax to [phone]. A call may be returned to [phone] to confirm (ask for documentation department).

## 2020-07-21 NOTE — Telephone Encounter (Signed)
Spoke with patient and [facility] on conference call to discuss patients concerns with BiPAP and Oxygen Concentrator. Patient states she has been reaching out to DME for months for someone to come check her "oxygen machine" and she just been playing phone tag with [facility]. Patient has concerns with whether or not she needs to have oxygen concentrator with BiPAP. She states that she does not need the concentrator and has not used in since it was delivered in January. She has been charged over $200 monthly for the machine as she does not have insurance. I confirmed with [facility] as well as with the patient that the discontinue order will have to come from a physician and that the patient will have to have an office visit to have that conversation. Pt is aware and agreeable. Patient was transferred to [facility] CPAP/BiPAP supplies to arrange ordering new supplies.

## 2020-08-04 ENCOUNTER — Telehealth: Payer: Self-pay | Admitting: Cardiovascular Disease

## 2020-08-04 ENCOUNTER — Other Ambulatory Visit: Payer: Self-pay | Admitting: Cardiovascular Disease

## 2020-08-04 ENCOUNTER — Other Ambulatory Visit: Payer: Self-pay

## 2020-08-04 DIAGNOSIS — I10 Essential (primary) hypertension: Secondary | ICD-10-CM

## 2020-08-04 MED ORDER — METOPROLOL SUCCINATE ER 50 MG PO TB24: 50.0000 mg | ORAL_TABLET | Freq: Every day | ORAL | 3 refills | Status: DC

## 2020-08-04 NOTE — Telephone Encounter (Signed)
     Pt c/o medication issue:  1. Name of Medication:   metoprolol succinate (TOPROL-XL) 50 MG 24 hr tablet    2. How are you currently taking this medication (dosage and times per day)? Take 1 tablet (50 mg total) by mouth daily.  3. Are you having a reaction (difficulty breathing--STAT)?   4. What is your medication issue? Pt said Dr. Claiborne Billings advised her to take half a pill in AM and 1 pill at night. Please send new script to [facility], [address] for 90 days supply

## 2020-08-04 NOTE — Telephone Encounter (Signed)
Pt stated she had expressed to [facility] her issues of feeling tired so he advised she take her metoprolol this way, half a pill in the morning and 1 whole pill at night.  Notified I would send in the prescription. No other questions at this time.

## 2020-08-05 NOTE — Telephone Encounter (Signed)
Patient states she still has not gotten her CPAP supplies and has not heard from [facility].

## 2020-08-05 NOTE — Telephone Encounter (Signed)
Pt offered number for Roderic Palau at [facility] [phone]... she will call back and let us know if she has any further difficulties once she reaches out to him re: her CPAP supplies.

## 2020-08-05 NOTE — Telephone Encounter (Signed)
Follow up   Pt is calling back, she said she tried calling [facility] and look for Roderic Palau, but [facility] dont know who Roderic Palau is . She doesn't know who to talk to there.

## 2020-08-05 NOTE — Telephone Encounter (Signed)
Spoke with patient and scheduled appointment next week with Dr Claiborne Billings

## 2020-08-07 MED FILL — METOPROLOL SUCCINATE ER 50: 50 | 30 days supply | Qty: 30 | Fill #0

## 2020-08-13 ENCOUNTER — Ambulatory Visit (INDEPENDENT_AMBULATORY_CARE_PROVIDER_SITE_OTHER): Payer: Self-pay | Admitting: Cardiovascular Disease

## 2020-08-13 ENCOUNTER — Encounter: Payer: Self-pay | Admitting: Cardiovascular Disease

## 2020-08-13 ENCOUNTER — Other Ambulatory Visit: Payer: Self-pay

## 2020-08-13 VITALS — BP 118/70 | HR 75 | Ht 67.0 in | Wt 299.0 lb

## 2020-08-13 DIAGNOSIS — E119 Type 2 diabetes mellitus without complications: Secondary | ICD-10-CM

## 2020-08-13 DIAGNOSIS — E785 Hyperlipidemia, unspecified: Secondary | ICD-10-CM

## 2020-08-13 DIAGNOSIS — Z7901 Long term (current) use of anticoagulants: Secondary | ICD-10-CM

## 2020-08-13 DIAGNOSIS — I480 Paroxysmal atrial fibrillation: Secondary | ICD-10-CM

## 2020-08-13 DIAGNOSIS — E0500 Thyrotoxicosis with diffuse goiter without thyrotoxic crisis or storm: Secondary | ICD-10-CM

## 2020-08-13 DIAGNOSIS — G4733 Obstructive sleep apnea (adult) (pediatric): Secondary | ICD-10-CM

## 2020-08-13 DIAGNOSIS — I10 Essential (primary) hypertension: Secondary | ICD-10-CM

## 2020-08-13 MED FILL — $TRULICITY 1.5 MG/0.5 ML PE: 1.5 | 27 days supply | Qty: 2 | Fill #1

## 2020-08-13 NOTE — Patient Instructions (Signed)
Medication Instructions:  CONTINUE WITH CURRENT MEDICATIONS. NO CHANGES.  D/C your compact concentrator  *If you need a refill on your cardiac medications before your next appointment, please call your pharmacy*   Follow-Up: At [facility], you and your health needs are our priority.  As part of our continuing mission to provide you with exceptional heart care, we have created designated Provider Care Teams.  These Care Teams include your primary Cardiologist (physician) and Advanced Practice Providers (APPs -  Physician Assistants and Nurse Practitioners) who all work together to provide you with the care you need, when you need it.  We recommend signing up for the patient portal called "MyChart".  Sign up information is provided on this After Visit Summary.  MyChart is used to connect with patients for Virtual Visits (Telemedicine).  Patients are able to view lab/test results, encounter notes, upcoming appointments, etc.  Non-urgent messages can be sent to your provider as well.   To learn more about what you can do with MyChart, go to [email or web address].    Your next appointment:   6 month(s)  The format for your next appointment:   In Person  Provider:   [name], MD

## 2020-08-13 NOTE — Progress Notes (Signed)
Cardiology Office Note    Date:  08/17/2020   ID:  Haley [name], DOB 1966-01-11, MRN [id number]  PCP:  Haley Pounds, NP  Cardiologist:  Haley Majestic, MD (sleep)  3 month evaluation initial follow-up after she had received a BiPAP unit  History of Present Illness:  Haley [name] is a 54 y.o. female who presents for a follow-up sleep  evaluation.  Haley [name] has a history of PAF, which initially occurred in the setting of hyperthyroidism in 2010.  She recently developed recurrent paroxysmal atrial fibrillation and underwent cardioversion in the ED in September 2017.  She has a history of type 2 diabetes mellitus, morbid obesity, hypertension, and she had recently seen Haley [name] in November 2017.  At that time, she was started on beta blocker therapy.  She was started on eliquis anticoagulation.  Due to concerns for obstructive sleep apnea.  She was referred for a sleep study which was done on 12/13/2016.  She had severe sleep apnea with an AHI of 111.2 per hour, an RDI of 113.  AHI during REM sleep was 74.2.  She had severe oxygen desaturation to 51%.  She underwent a CPAP, and ultimate BiPAP titration on 01/04/2017.  On her titration study CPAP was advanced to 19 cm water pressure but due to continued events, BiPAP was implemented and she required maximum titration up to 25/21.  At 23/19, AHI was still elevated 6.4, but 25/21 at AHI was 0.  Apparently, the patient had concerns about cost since she currently does not have insurance.  She was wondering about using a CPAP from a family member since he no longer uses treatment.  I was concerned that CPAP may not be as beneficial.  When I saw her in July 2018, she  brought the machine with her to the office today for our review and assessment.  She was given a ResMed S9 and had a  full face mas given to her the time of first titration study.  At that time she was going  to bed around 1 AM and waking up for good around noon.   She had frequent awakenings during the night and daytime sleepiness.  Epworth Sleepiness Scale: Situation   Chance of Dozing/Sleeping (0 = never , 1 = slight chance , 2 = moderate chance , 3 = high chance )   sitting and reading 2   watching TV 2   sitting inactive in a public place 2   being a passenger in a motor vehicle for an hour or more 1   lying down in the afternoon 2   sitting and talking to someone 1   sitting quietly after lunch (no alcohol) 2   while stopped for a few minutes in traffic as the driver 2   Total Score  14   When I saw her, I recommended that her CPAP unit  adjusted to it 20 cm maximum pressure.  We had set up the process through assisted support for her to try to get a BiPAP machine.  She did reach out to [facility] since this was the DME company that had supplied her friend with the CPAP unit that she was using.  She has felt improved since using CPAP therapy.  However, she still admits to fatigue.  She feels  that in the middle of night she was not getting enough air and for this reason , was taking her mask off when she felt the sensation.  When I saw her in March 2020 he had not received a BiPAP machine she did not bring the CPAP of her friend who she was using for interrogation.  When I  saw her in August 2020, she was still having issues with sleep and admitted to no energy, poor sleep, snoring, and nocturia at least 3 times per night.  She is now working doing senior sitting.  She still does not have insurance.  She is unaware of any recurrent or fibrillation and continued to be on Eliquis for anticoagulation in addition to metoprolol succinate 50 mg in the morning and 25 mg in the evening.  She is diabetic on Trulicity in addition to metformin and glipizide.  She is on atorvastatin 20 mg daily for hyperlipidemia.  She was recently admitted to hospital in January 2021 with acute hypoxic respiratory failure secondary to acute diastolic CHF contributed by  untreated sleep apnea.  CTA was negative for PE although she had small airway disease.  Lower extremity Dopplers were negative for DVT.  An echo Doppler study revealed an EF of 60 to 65%, moderate LVH without wall motion abnormality.  She was treated with a 6.  She was maintaining sinus rhythm on beta-blocker and Eliquis.  Prior to her hospitalization she was not using her CPAP therapy.  Care management was consulted instantly with help from the heart and vascular patient find she ultimately was able to receive a new ResMed air curve BiPAP unit with set up date December 27, 2019.    I saw her in the office on January 16, 2020 for follow-up evaluation.  During that in office evaluation I was unable to obtain data since she had not been linked to our office.  However subsequently we were able to get her BiPAP unit linked  to our office.  Since her set up date of December 27, 2019 she has used it all days with the exception of 1.  Her EPAP pressure is set at 10 with IPAP pressure of 18.  AHI is 4.4 with a central index of 1.4.  She has felt significantly improved since reinitiating therapy and feels significantly better with compared to her prior CPAP.  She is unaware of any breakthrough snoring.  She is also having oxygen supplementation through her BiPAP unit.  She is try to make dietary adjustments with her morbid obesity.  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  She was seen in follow-up in May 2021 and over the previous  several months, she has felt well.  I obtained a download from March 29, 2020 through Apr 27, 2020.  Compliance was suboptimal with only 60% of usage days.  However this is explained by the fact that her husband was in the hospital for several weeks and she was sleeping in the hospital in a recliner and not at home with her BiPAP unit.  Pressures are set at an EPAP of 10 and IPAP of 19 cm of water.  AHI is 2.7.  A new Epworth Sleepiness Scale score was  calculated in the office today and this endorsed at 10.  During that evaluation I discussed the importance of optimal use between 7 and 8 hours per night.  She felt more rested when using BiPAP therapy.  Presently, she denies any chest pain.  She is unaware of recurrent episodes of atrial fibrillation.  She has used BiPAP but she does not believe she needs to separate compact oxygen concentrator since  she has supplemental oxygen tanks at home.  The insurance company will not discontinue this unless they get my approval.  I did obtain a download from July 7 through August 5.  She again is having suboptimal compliance with only 50% of usage days and usage average at 4 hours and 4 minutes.  At her 19/10 BiPAP pressure AHI, however is very good at 2.4.  Her tubing broke which explains her nonuse since July 09, 2020.  She had been contacting [facility] but was never able to get it a replacement hose.  She has not been successful with weight loss. She presents for evaluation.  Past Medical History:  Diagnosis Date  . Essential hypertension   . Fibroids   . Hyperthyroidism    a. 2010, treated w/ tapazole.  . Morbid obesity ([place])   . Paroxysmal atrial fibrillation ([place])    a. 03/2009 - in setting of hyperthyroidism and caffeine intake, converted spontaneously;  b. 08/2016 ED visit for recurrent PAF-->successful DCCV in ED.  [name] Snores   . Type II diabetes mellitus ([place])     Past Surgical History:  Procedure Laterality Date  . UTERINE FIBROID SURGERY      Current Medications: Outpatient Medications Prior to Visit  Medication Sig Dispense Refill  . acetaminophen (TYLENOL 8 HOUR ARTHRITIS PAIN) 650 MG CR tablet Take 650 mg by mouth every 8 (eight) hours as needed for pain.    [name] apixaban (ELIQUIS) 5 MG TABS tablet TAKE 1 TABLET (5 MG TOTAL) BY MOUTH 2 (TWO) TIMES DAILY. 60 tablet 3  . atorvastatin (LIPITOR) 20 MG tablet Take 1 tablet (20 mg total) by mouth daily. 90 tablet 3  . Blood Glucose Monitoring Suppl  (TRUE METRIX METER) w/Device KIT Use twice daily 1 kit 0  . Dulaglutide (TRULICITY) 1.5 LK/9.1PH SOPN Inject 1.5 mg into the skin once a week. 2 mL 2  . furosemide (LASIX) 20 MG tablet Take 1 tablet (20 mg total) by mouth as needed for edema. 30 tablet 2  . glipiZIDE (GLUCOTROL) 10 MG tablet Take 1 tablet (10 mg total) by mouth 2 (two) times daily before a meal. 180 tablet 1  . glucose blood (TRUE METRIX BLOOD GLUCOSE TEST) test strip Use as instructed. Check blood glucose level by fingerstick twice per day. 200 each 12  . Insulin Pen Needle (B-D UF III MINI PEN NEEDLES) 31G X 5 MM MISC Use as instructed. Inject into the skin once nightly. 100 each 6  . lisinopril (ZESTRIL) 2.5 MG tablet Take 2 tablets (5 mg total) by mouth daily. 180 tablet 1  . metFORMIN (GLUCOPHAGE-XR) 500 MG 24 hr tablet Take 4 tablets (2,000 mg total) by mouth at bedtime. 360 tablet 1  . methimazole (TAPAZOLE) 5 MG tablet Take 1 tablet (5 mg total) by mouth 2 (two) times daily. 180 tablet 1  . Methylsulfonylmethane (MSM PO) Take 2 tablets by mouth daily.    . metoprolol succinate (TOPROL-XL) 50 MG 24 hr tablet Take 1 tablet (50 mg total) by mouth daily. 90 tablet 3  . Multiple Vitamins-Minerals (ALIVE WOMENS 50+) TABS Take 1 tablet by mouth daily.    . TRUEplus Lancets 28G MISC Use twice daily for blood glucose check 200 each 6  . gabapentin (NEURONTIN) 100 MG capsule Take 1 capsule (100 mg total) by mouth at bedtime. 30 capsule 3   No facility-administered medications prior to visit.     Allergies:   Patient has no known allergies.   Social History  Socioeconomic History  . Marital status: Married    Spouse name: Not on file  . Number of children: Not on file  . Years of education: Not on file  . Highest education level: Not on file  Occupational History  . Occupation: cosmetologist  Tobacco Use  . Smoking status: Former Smoker    Packs/day: 1.00    Years: 32.00    Pack years: 32.00    Quit date: 2012     Years since quitting: 9.7  . Smokeless tobacco: Never Used  . Tobacco comment: patient vapes  Vaping Use  . Vaping Use: Never used  Substance and Sexual Activity  . Alcohol use: No    Comment: occasional drink.  . Drug use: No  . Sexual activity: Yes  Other Topics Concern  . Not on file  Social History Narrative   Lives in [place] with husband.  Systems analyst.  Also in school @ [employer] for [employer].  Does not routinely exercise.   Social Determinants of Health   Financial Resource Strain:   . Difficulty of Paying Living Expenses: Not on file  Food Insecurity:   . Worried About Charity fundraiser in the Last Year: Not on file  . Ran Out of Food in the Last Year: Not on file  Transportation Needs:   . Lack of Transportation (Medical): Not on file  . Lack of Transportation (Non-Medical): Not on file  Physical Activity:   . Days of Exercise per Week: Not on file  . Minutes of Exercise per Session: Not on file  Stress:   . Feeling of Stress : Not on file  Social Connections:   . Frequency of Communication with Friends and Family: Not on file  . Frequency of Social Gatherings with Friends and Family: Not on file  . Attends Religious Services: Not on file  . Active Member of Clubs or Organizations: Not on file  . Attends Archivist Meetings: Not on file  . Marital Status: Not on file     Family History:  The patient's family history includes Cancer in her maternal grandmother; Diabetes Mellitus II in her sister; Hypertension in her mother.  Family history is notable in that her uncle  had sleep apnea but has not utilized treatment and he gave her his machine.  ROS General: Negative; No fevers, chills, or night sweats; morbid obesity HEENT: Negative; No changes in vision or hearing, sinus congestion, difficulty swallowing Pulmonary: Negative; No cough, wheezing, shortness of breath, hemoptysis Cardiovascular: History of PAF GI: Negative; No nausea, vomiting,  diarrhea, or abdominal pain GU: Negative; No dysuria, hematuria, or difficulty voiding Musculoskeletal: Negative; no myalgias, joint pain, or weakness Hematologic/Oncology: Negative; no easy bruising, bleeding Endocrine: Positive for diabetes mellitus Neuro: Negative; no changes in balance, headaches Skin: Negative; No rashes or skin lesions Psychiatric: Negative; No behavioral problems, depression Sleep: Positive for severe sleep apnea; snoring, daytime sleepiness, hypersomnolence;  No bruxism, restless legs, hypnogognic hallucinations, no cataplexy Other comprehensive 14 point system review is negative.   PHYSICAL EXAM:   VS:  BP 118/70   Pulse 75   Ht $R'5\' 7"'xx$  (1.702 m)   Wt 299 lb (135.6 kg)   BMI 46.83 kg/m     Repeat blood pressure by me was 124/64  Wt Readings from Last 3 Encounters:  08/13/20 299 lb (135.6 kg)  04/28/20 292 lb (132.5 kg)  04/23/20 294 lb (133.4 kg)    General: Alert, oriented, no distress.  Morbidly obese. Skin: normal  turgor, no rashes, warm and dry HEENT: Normocephalic, atraumatic. Pupils equal round and reactive to light; sclera anicteric; extraocular muscles intact;  Nose without nasal septal hypertrophy Mouth/Parynx benign; Mallinpatti scale 4 Neck: Thick neck; no JVD, no carotid bruits; normal carotid upstroke Lungs: clear to ausculatation and percussion; no wheezing or rales Chest wall: without tenderness to palpitation Heart: PMI not displaced, RRR, s1 s2 normal, 1/6 systolic murmur, no diastolic murmur, no rubs, gallops, thrills, or heaves Abdomen: Central adiposity; soft, nontender; no hepatosplenomehaly, BS+; abdominal aorta nontender and not dilated by palpation. Back: no CVA tenderness Pulses 2+ Musculoskeletal: full range of motion, normal strength, no joint deformities Extremities: no clubbing cyanosis or edema, Homan's sign negative  Neurologic: grossly nonfocal; Cranial nerves grossly wnl Psychologic: Normal mood and  affect  Studies/Labs Reviewed:   ECG (independently read by me): ECG (independently read by me): NSR at 75, no ectopy, normal intervals  Apr 24, 2020  ECG (independently read by me): NSR at 84; no ectopy; normal intervals  I personally reviewed most recent ECG from December 22, 2019 which showed sinus rhythm at 69 bpm.  There were nonspecific ST changes.  August 05, 2019 ECG (independently read by me): Normal sinus rhythm at 83 bpm.  Probable left atrial enlargement.  QTc interval 432 ms PR interval 146 ms  ECG (independently read by me): Normal sinus rhythm at 84 bpm.  Normal intervals.  No ectopy  October 2018 ECG (independently read by me): Normal sinus rhythm at 73 bpm.  No ectopy.  Normal intervals.  Recent Labs: BMP Latest Ref Rng & Units 07/02/2020 03/24/2020 12/24/2019  Glucose 65 - 99 mg/dL 108(H) 95 184(H)  BUN 6 - 24 mg/dL $Remove'14 10 12  'CiNnhLO$ Creatinine 0.57 - 1.00 mg/dL 0.61 0.57 0.51  BUN/Creat Ratio 9 - $R'23 23 18 'et$ -  Sodium 134 - 144 mmol/L 142 144 140  Potassium 3.5 - 5.2 mmol/L 4.7 4.0 4.1  Chloride 96 - 106 mmol/L 101 102 99  CO2 20 - 29 mmol/L 28 25 32  Calcium 8.7 - 10.2 mg/dL 10.2 9.7 8.8(L)     Hepatic Function Latest Ref Rng & Units 07/02/2020 03/24/2020 02/19/2019  Total Protein 6.0 - 8.5 g/dL 7.9 7.4 7.3  Albumin 3.8 - 4.9 g/dL 4.8 4.6 4.5  AST 0 - 40 IU/L $Remov'20 19 14  'FCaxLQ$ ALT 0 - 32 IU/L $Remov'17 15 13  'NkXguA$ Alk Phosphatase 48 - 121 IU/L 68 62 58  Total Bilirubin 0.0 - 1.2 mg/dL 0.4 0.3 0.3    CBC Latest Ref Rng & Units 12/21/2019 02/19/2019 01/25/2019  WBC 4.0 - 10.5 K/uL 9.9 7.7 8.0  Hemoglobin 12.0 - 15.0 g/dL 13.7 14.1 13.3  Hematocrit 36 - 46 % 45.6 42.6 43.5  Platelets 150 - 400 K/uL 246 244 244   Lab Results  Component Value Date   MCV 95.6 12/21/2019   MCV 92 02/19/2019   MCV 94.4 01/25/2019   Lab Results  Component Value Date   TSH 0.73 04/23/2020   Lab Results  Component Value Date   HGBA1C 7.1 (H) 07/02/2020     BNP    Component Value Date/Time   BNP 205.8  (H) 12/21/2019 1427    ProBNP No results found for: PROBNP   Lipid Panel     Component Value Date/Time   CHOL 131 07/02/2020 1420   TRIG 56 07/02/2020 1420   HDL 53 07/02/2020 1420   CHOLHDL 2.5 07/02/2020 1420   CHOLHDL 3.5 01/25/2019 0850   VLDL 19 01/25/2019  [place] 07/02/2020 1420     RADIOLOGY: No results found.   Additional studies/ records that were reviewed today include:  I have reviewed the patient's prior ER evaluation for cardioversion, A. fib clinic note, and evaluation by Ignacia Bayley, NP  I have reviewed her recent hospitalization from 1016 through December 23, 2019. New download was obtained. An Epworth Sleepiness Scale score was recalculated today which endorsed at 5   ASSESSMENT:    1. Essential hypertension   2. OSA (obstructive sleep apnea)   3. Morbid obesity ([place])   4. Controlled type 2 diabetes mellitus without complication, without long-term current use of insulin ([place])   5. Hyperlipidemia with target LDL less than 70   6. Graves disease   7. Paroxysmal atrial fibrillation ([facility])   8. Anticoagulated     PLAN:  Haley [name] is a 54 year old female who history of morbid obesity, hypertension, paroxysmal atrial fibrillation, type 2 diabetes mellitus, and was diagnosed as having very severe sleep apnea.  She failed CPAP therapy and required maximum BiPAP therapy at 25/21 for optimal treatment.  Initially, she never instituted BiPAP therapy due to lack of insurance.  At a prior evaluation she was  borrowing a friend's CPAP unit.  At that time she still had issues and was remaining sleepy felt as though she was not getting adequate air particularly in the middle of the night. She was admitted to the hospital in January 2021 with hypoxic respiratory failure felt secondary to acute diastolic heart failure.  She had not been using her CPAP and clearly was affected by her untreated sleep apnea.  During that evaluation she was also started on  methimazole hyperthyroidism with TSH at 0.010.  Fortunately, she was evaluated by care manager and through the generosity of the heart and vascular found as an outpatient she was able to receive a new ResMed air curve and BiPAP unit.  Her set up date was December 27, 2019.  When I saw her in February 2021 she was meeting compliance standards with reference to usage and has only had 1 day without usage.  She was set at a 18/10 pressure and a download revealed an AHI of 4.4 and central apnea of 1.4.  When I last evaluated her in May 2021 I discussed the importance of improved compliance.  At a 19/10 pressure AHI was 2.7 and an Epworth scale endorsed at 10.  She states that she did significantly improve her compliance after that evaluation.  However her tubing cracked and since August 5 she has been trying to get a replacement tube for her BiPAP therapy which has been apparently unsuccessful.  Fortunately in the office today we did have some samples of her tubing and I provided her with a new tube so that she can reinitiate BiPAP tonight.  An Epworth scale endorsed today was 5.  She feels better when she uses therapy.  She has not had any recurrent atrial fibrillation.  She continues to be on Eliquis for anticoagulation.  Her blood pressure today is stable on lisinopril 5 mg and Toprol-XL 50 mg daily and she takes furosemide as needed for swelling.  She is diabetic on Metformin, glipizide and Trulicity.  She is on atorvastatin for hyperlipidemia.  LDL cholesterol in July 2021 was at target at 8.  I discussed the importance of weight loss with her morbid obesity and BMI of 46.83.  We discussed the importance of exercise at least 5 days/week for minimum of  30 minutes.  We discussed improved compliance and optimal sleep duration at least 7 to 8 hours per night.  Since she does have supplemental oxygen tanks at home I recommended that she can discontinue the oxygen compact concentrator which she is having to pay for on a  monthly basis.  She has a history of Graves' disease followed by Dr. Maryellen Pile and is on methimazole 5 mg twice a day.  I will see her in 6 months for reevaluation.  Haley Majestic, MD

## 2020-08-17 ENCOUNTER — Encounter: Payer: Self-pay | Admitting: Cardiovascular Disease

## 2020-08-17 MED FILL — LISINOPRIL 2.5 MG TABLET: 2.5 | 30 days supply | Qty: 60 | Fill #1

## 2020-08-17 MED FILL — ATORVASTATIN CALCIUM 20 MG: 20 | 30 days supply | Qty: 30 | Fill #4

## 2020-08-20 MED FILL — ?GLIPIZIDE 10 MG TABLET: 10 | 30 days supply | Qty: 60 | Fill #1

## 2020-08-27 MED FILL — metFORMIN HCL ER 500 MG TB2: 500 | 30 days supply | Qty: 120 | Fill #1

## 2020-08-28 ENCOUNTER — Ambulatory Visit (INDEPENDENT_AMBULATORY_CARE_PROVIDER_SITE_OTHER): Payer: Self-pay | Admitting: Internal Medicine

## 2020-08-28 ENCOUNTER — Encounter: Payer: Self-pay | Admitting: Internal Medicine

## 2020-08-28 ENCOUNTER — Other Ambulatory Visit: Payer: Self-pay

## 2020-08-28 VITALS — BP 128/72 | HR 90 | Wt 299.0 lb

## 2020-08-28 DIAGNOSIS — E040 Nontoxic diffuse goiter: Secondary | ICD-10-CM

## 2020-08-28 DIAGNOSIS — E049 Nontoxic goiter, unspecified: Secondary | ICD-10-CM

## 2020-08-28 DIAGNOSIS — E0500 Thyrotoxicosis with diffuse goiter without thyrotoxic crisis or storm: Secondary | ICD-10-CM

## 2020-08-28 NOTE — Patient Instructions (Signed)
Please stop at the lab.  Please continue: - Methimazole 5 mg 2x a day with meals - Toprol XL 25 mg in am and 50 mg at night  Please come back for a follow-up appointment in 6 months.

## 2020-08-28 NOTE — Progress Notes (Signed)
Patient ID: Haley [name], female   DOB: 1966/04/01, 54 y.o.   MRN: [id number]   This visit occurred during the SARS-CoV-2 public health emergency.  Safety protocols were in place, including screening questions prior to the visit, additional usage of staff PPE, and extensive cleaning of exam room while observing appropriate contact time as indicated for disinfecting solutions.   HPI  Haley [name] is a 54 y.o.-year-old female, initially referred by her PCP, Haley Pounds, NP, returning for follow-up for thyrotoxicosis, diagnosed as Graves' disease at last visit and also large goiter.  Last visit 4 months ago.  Reviewed and addended history: Patient has a history of thyrotoxicosis since 2010. At that time she was hospitalized with pericarditis and Acute heart failure.   After diagnosis, she was started on a medication (? MMI) >> she could not tolerate 2/2 extreme fatigue >> changed to another medication (? PTU).  Tests normalized afterwards and she was taken off the medication.    However, she again started to have symptoms: Weakness, shortness of breath.  The TSH was rechecked and this was low while her free T4 was high.   She was started on methimazole and Toprol.  When I last saw her, her TSH returned elevated and a free T4 returned low.  We decreased her methimazole and Toprol.  Due to insomnia,  we increased her metoprolol to the previous dose, but gave her the higher dose at night.  She is treated with: - Methimazole 5 mg 3 times a day  >> 5 mg twice a day - missed 2-7 days recently... - Toprol-XL 50 mg daily + 25 mg at night >> 50 mg daily >> 25 mg in a.m. and 50 mg at night  She feels good on the above regimen.  I reviewed her TFTs: Lab Results  Component Value Date   TSH 0.73 04/23/2020   TSH 4.51 (H) 01/24/2020   TSH 0.010 (L) 12/21/2019   TSH 1.624 01/24/2019   TSH 1.015 01/02/2019   TSH 1.47 10/11/2016   Lab Results  Component Value Date   FREET4 0.75  04/23/2020   FREET4 0.45 (L) 01/24/2020   FREET4 1.33 (H) 12/22/2019   No components found for: FREET3   Her TSI antibodies were elevated: Lab Results  Component Value Date   TSI 658 (H) 01/24/2020    She also has a large goiter with substernal extension.  Thyroid ultrasound (05/08/2019): Enlarged thyroid, no nodules. Inferior margin of the left thyroid lobe is difficult to visualize due to the substernal extension.  CXR (12/21/2019): Per review of the images, trachea is slightly deviated to the right  CT chest (12/22/2019): Stable, large goiter, extending into the substernal space  At the time of her diagnosis, she complained of: - weight gain, however, she does have weight loss per review of the chart, approximately 7 [name] net in the last month - Heat intolerance-chronic - Poor sleep - Shortness of breath - Leg swelling - Hair loss  At last visit, she still had some shortness of breath (she felt this was related to deconditioning, and still had heat intolerance (she is postmenopausal), but she had improved sleep and hair loss.   At today's visit, she is feeling well, but feels tired, still has hot flushes, has palpitations - saw Dr. Claiborne [name] recently.  Pt denies: - feeling nodules in neck - hoarseness - dysphagia - choking  But does have shortness of breath.  Pt does have a FH of thyroid ds.: M  aunt and M cousin. No FH of thyroid cancer. No h/o radiation tx to head or neck.  No herbal supplements. No Biotin use. No recent steroids use.   Patient also has CHF, and also severe OSA-started on CPAP in 12/2019 -felt much better afterwards. She had to stop as her hose broke >> she just restarted this.  Last year, she started exercising: Stationary bike, walking, chair aerobics, weightlifting.  ROS: Constitutional: + weight gain/no weight loss, no fatigue, + subjective hyperthermia, no subjective hypothermia Eyes: no blurry vision, no xerophthalmia ENT: no sore throat, no  nodules palpated in neck, no dysphagia, no odynophagia, no hoarseness Cardiovascular: no CP/+ SOB/+ palpitations/no leg swelling Respiratory: no cough/+ SOB/no wheezing Gastrointestinal: no N/no V/no D/no C/no acid reflux Musculoskeletal: no muscle aches/+ joint aches Skin: no rashes, no hair loss Neurological: no tremors/no numbness/no tingling/no dizziness  I reviewed pt's medications, allergies, PMH, social hx, family hx, and changes were documented in the history of present illness. Otherwise, unchanged from my initial visit note.  Past Medical History:  Diagnosis Date  . Essential hypertension   . Fibroids   . Hyperthyroidism    a. 2010, treated w/ tapazole.  . Morbid obesity (HCC)   . Paroxysmal atrial fibrillation (HCC)    a. 03/2009 - in setting of hyperthyroidism and caffeine intake, converted spontaneously;  b. 08/2016 ED visit for recurrent PAF-->successful DCCV in ED.  [name] Snores   . Type II diabetes mellitus (HCC)    Past Surgical History:  Procedure Laterality Date  . UTERINE FIBROID SURGERY     Social History   Socioeconomic History  . Marital status: Married    Spouse name: Not on file  . Number of children: 0  . Years of education: Not on file  . Highest education level: Not on file  Occupational History  . Occupation:  Environmental health practitioner, Scientist, research (medical)  Tobacco Use  . Smoking status: Former Smoker, quit in 2018    Packs/day: 1.00    Years: 32.00    Pack years: 32.00    Quit date: 2012    Years since quitting: 9.1  . Smokeless tobacco: Never Used  . Tobacco comment: patient vapes  Substance and Sexual Activity  . Alcohol use: No    Comment: Wine, 2-3 glasses, every 3 weeks  . Drug use: No  Social History Narrative   Lives in [place] with husband.  English as a second language teacher.  Also in school @ [employer] for PT technician.     Social Determinants of Health   Financial Resource Strain:   . Difficulty of Paying Living Expenses: Not on file  Food Insecurity:   . Worried  About Programme researcher, broadcasting/film/video in the Last Year: Not on file  . Ran Out of Food in the Last Year: Not on file  Transportation Needs:   . Lack of Transportation (Medical): Not on file  . Lack of Transportation (Non-Medical): Not on file  Physical Activity:   . Days of Exercise per Week: Not on file  . Minutes of Exercise per Session: Not on file  Stress:   . Feeling of Stress : Not on file  Social Connections:   . Frequency of Communication with Friends and Family: Not on file  . Frequency of Social Gatherings with Friends and Family: Not on file  . Attends Religious Services: Not on file  . Active Member of Clubs or Organizations: Not on file  . Attends Banker Meetings: Not on file  . Marital Status: Not  on file  Intimate Partner Violence:   . Fear of Current or Ex-Partner: Not on file  . Emotionally Abused: Not on file  . Physically Abused: Not on file  . Sexually Abused: Not on file   Current Outpatient Medications on File Prior to Visit  Medication Sig Dispense Refill  . acetaminophen (TYLENOL 8 HOUR ARTHRITIS PAIN) 650 MG CR tablet Take 650 mg by mouth every 8 (eight) hours as needed for pain.    [name] apixaban (ELIQUIS) 5 MG TABS tablet TAKE 1 TABLET (5 MG TOTAL) BY MOUTH 2 (TWO) TIMES DAILY. 60 tablet 3  . atorvastatin (LIPITOR) 20 MG tablet Take 1 tablet (20 mg total) by mouth daily. 90 tablet 3  . Blood Glucose Monitoring Suppl (TRUE METRIX METER) w/Device KIT Use twice daily 1 kit 0  . Dulaglutide (TRULICITY) 1.5 WI/0.9BD SOPN Inject 1.5 mg into the skin once a week. 2 mL 2  . furosemide (LASIX) 20 MG tablet Take 1 tablet (20 mg total) by mouth as needed for edema. 30 tablet 2  . gabapentin (NEURONTIN) 100 MG capsule Take 1 capsule (100 mg total) by mouth at bedtime. 30 capsule 3  . glipiZIDE (GLUCOTROL) 10 MG tablet Take 1 tablet (10 mg total) by mouth 2 (two) times daily before a meal. 180 tablet 1  . glucose blood (TRUE METRIX BLOOD GLUCOSE TEST) test strip Use as  instructed. Check blood glucose level by fingerstick twice per day. 200 each 12  . Insulin Pen Needle (B-D UF III MINI PEN NEEDLES) 31G X 5 MM MISC Use as instructed. Inject into the skin once nightly. 100 each 6  . lisinopril (ZESTRIL) 2.5 MG tablet Take 2 tablets (5 mg total) by mouth daily. 180 tablet 1  . metFORMIN (GLUCOPHAGE-XR) 500 MG 24 hr tablet Take 4 tablets (2,000 mg total) by mouth at bedtime. 360 tablet 1  . methimazole (TAPAZOLE) 5 MG tablet Take 1 tablet (5 mg total) by mouth 2 (two) times daily. 180 tablet 1  . Methylsulfonylmethane (MSM PO) Take 2 tablets by mouth daily.    . metoprolol succinate (TOPROL-XL) 50 MG 24 hr tablet Take 1 tablet (50 mg total) by mouth daily. 90 tablet 3  . Multiple Vitamins-Minerals (ALIVE WOMENS 50+) TABS Take 1 tablet by mouth daily.    . TRUEplus Lancets 28G MISC Use twice daily for blood glucose check 200 each 6   No current facility-administered medications on file prior to visit.   No Known Allergies Family History  Problem Relation Age of Onset  . Hypertension Mother   . Diabetes Mellitus II Sister   . Cancer Maternal Grandmother     PE: BP 128/72 (BP Location: Right Arm, Patient Position: Sitting, Cuff Size: Large)   Pulse 90   Wt 299 lb (135.6 kg)   SpO2 94%   BMI 46.83 kg/m  Wt Readings from Last 3 Encounters:  08/28/20 299 lb (135.6 kg)  08/13/20 299 lb (135.6 kg)  04/28/20 292 lb (132.5 kg)   Constitutional: overweight, in NAD Eyes: PERRLA, EOMI, no exophthalmos, no lid lag, no stare ENT: moist mucous membranes, no thyromegaly, no cervical lymphadenopathy Cardiovascular: RRR, No MRG, no LE edema Respiratory: CTA B Gastrointestinal: abdomen soft, NT, ND, BS+ Musculoskeletal: no deformities, strength intact in all 4 Skin: moist, warm, no rashes Neurological: no tremor with outstretched hands, DTR normal in all 4  ASSESSMENT: 1. Thyrotoxicosis  2.  Goiter  PLAN:  1. Patient with chemical thyrotoxicosis and also  thyrotoxic symptoms: Weight  loss, heat intolerance (chronic, though), shortness of breath, weakness.   -Her TSI's were elevated, pointing towards a diagnosis of Graves' disease. -She was started on methimazole 5 mg 3 times a day.  Her symptoms improved afterwards.  At today's visit, she has no complaints. -She responded well to methimazole and we were able to reduce the dose gradually. She is currently on methimazole 5 mg twice a day.  She did miss several days in the last month.  I strongly advised her not to miss doses anymore. -We will recheck her TFTs today -We again discussed about possible modalities of treatment for the above conditions, to include methimazole use, RAI treatment, or surgery.   -She denies signs of Graves' ophthalmopathy: No double vision, blurry vision, eye pain, chemosis -She continues on 25 mg of Toprol-XL in a.m. and 50 mg at night.  She had trouble sleeping when she tried to reduce the dose at night. -I will see her back in 6 months  2.  Goiter -This is large, and extending in the mediastinum.  On the chest x-ray from 12/20/2019, trachea was slightly deviated to the right -Interestingly, she does not have significant neck compression symptoms.  She was complaining at last visit of occasional saliva catching in throat and also some cough but not bothersome.  Symptoms did improve after starting CPAP for sleep apnea.  She was off the CPAP for the last few months after the hose broke.  She just restarted it.  She is just getting used to the machine.   -Her oxygen saturation today is 90.  She has been feeling more short of breath and tired. -It is difficult to know whether the symptoms are related to the thyroid or they are due to her sleep apnea/deconditioning.  However, I feel that her breathing may improve after thyroidectomy.  Also, I explained that this would resolve her thyrotoxicosis.  I explained that she will need to be on levothyroxine for the rest of her life. -She  would like to think about the above options and let me know her decision -However, for now, we will continue to follow her clinically.  Component     Latest Ref Rng & Units 08/28/2020  TSH     0.450 - 4.500 uIU/mL 0.007 (L)  T4,Free(Direct)     0.82 - 1.77 ng/dL 1.63  Triiodothyronine,Free,Serum     2.0 - 4.4 pg/mL 4.5 (H)   Thyroid tests are worse.  This may be due to missing methimazole doses (she admitted that she missed several days, up to a week, recently).   At this point, I would suggest to continue the current regimen and recheck her tests in 1 month, but I would also want her to think about RAI treatment or thyroidectomy.  Philemon Kingdom, MD PhD [facility]

## 2020-08-29 LAB — TSH: TSH: 0.007 u[IU]/mL — ABNORMAL LOW (ref 0.450–4.500)

## 2020-08-29 LAB — T3, FREE: T3, Free: 4.5 pg/mL — ABNORMAL HIGH (ref 2.0–4.4)

## 2020-08-29 LAB — T4, FREE: Free T4: 1.63 ng/dL (ref 0.82–1.77)

## 2020-09-02 ENCOUNTER — Other Ambulatory Visit: Payer: Self-pay | Admitting: Internal Medicine

## 2020-09-02 MED ORDER — METHIMAZOLE 5 MG PO TABS
5.0000 mg | ORAL_TABLET | Freq: Two times a day (BID) | ORAL | 1 refills | Status: DC
Start: 2020-09-02 — End: 2020-11-26

## 2020-09-02 NOTE — Telephone Encounter (Signed)
Medication Refill Request  Did you call your pharmacy and request this refill first? No, advised  . If patient has not contacted pharmacy first, instruct them to do so for future refills.  . Remind them that contacting the pharmacy for their refill is the quickest method to get the refill.  . Refill policy also stated that it will take anywhere between 24-72 hours to receive the refill.    Name of medication? methimazole (TAPAZOLE) 5 MG tablet   Is this a 90 day supply? Unknown  Name and location of pharmacy? [facility]                                                         [place], [place], [place]

## 2020-09-02 NOTE — Telephone Encounter (Signed)
RX sent

## 2020-09-03 MED FILL — methIMAzole 5 MG TABS: 5 | 30 days supply | Qty: 60 | Fill #0

## 2020-09-07 ENCOUNTER — Other Ambulatory Visit: Payer: Self-pay | Admitting: Physician Assistant

## 2020-09-07 MED FILL — $ELIQUIS 5 MG TABLET: 5 | 90 days supply | Qty: 180 | Fill #0

## 2020-09-07 MED FILL — METOPROLOL SUCCINATE ER 50: 50 | 30 days supply | Qty: 30 | Fill #1

## 2020-09-07 NOTE — Telephone Encounter (Signed)
Prescription refill request for Eliquis received. Indication:  Atrial Fibrillation Last office visit: 08/2020 Claiborne Billings Scr: 0.61 06/2020 Age: 54 Weight:  135.6 kg  Prescription refilled

## 2020-09-23 ENCOUNTER — Other Ambulatory Visit: Payer: Self-pay | Admitting: Nurse Practitioner

## 2020-09-23 ENCOUNTER — Other Ambulatory Visit: Payer: Self-pay | Admitting: Family Medicine

## 2020-09-23 DIAGNOSIS — E1169 Type 2 diabetes mellitus with other specified complication: Secondary | ICD-10-CM

## 2020-09-23 MED FILL — METOPROLOL SUCCINATE ER 50: 50 | 30 days supply | Qty: 30 | Fill #2

## 2020-09-23 MED FILL — LISINOPRIL 2.5 MG TABLET: 2.5 | 30 days supply | Qty: 60 | Fill #2

## 2020-09-23 MED FILL — $TRULICITY 1.5 MG/0.5 ML PE: 1.5 | 84 days supply | Qty: 6 | Fill #0

## 2020-09-23 MED FILL — ATORVASTATIN CALCIUM 20 MG: 20 | 30 days supply | Qty: 30 | Fill #5

## 2020-09-28 ENCOUNTER — Telehealth: Payer: Self-pay

## 2020-09-28 NOTE — Telephone Encounter (Signed)
Copied from [place] [phone]. Topic: General - Other >> Sep 28, 2020  1:48 PM Leward Quan A wrote: Reason for CRM: Patient called in to ask Dr Raul Del nurse if it is possible to get her appointment for a later time in the afternoon. Please call with an answer Ph# [phone]

## 2020-09-29 NOTE — Telephone Encounter (Signed)
Attempt to reach patient to inform we do not have any appointment open in the afternoon. Unable to lvm due to mailbox is full.

## 2020-09-30 ENCOUNTER — Telehealth: Payer: Self-pay | Admitting: Nurse Practitioner

## 2020-09-30 ENCOUNTER — Other Ambulatory Visit: Payer: Self-pay | Admitting: Nurse Practitioner

## 2020-09-30 ENCOUNTER — Other Ambulatory Visit: Payer: Self-pay

## 2020-09-30 ENCOUNTER — Ambulatory Visit: Payer: Self-pay | Attending: Nurse Practitioner | Admitting: Nurse Practitioner

## 2020-09-30 VITALS — BP 127/78 | HR 80 | Temp 97.7°F | Ht 67.0 in | Wt 294.0 lb

## 2020-09-30 DIAGNOSIS — E1169 Type 2 diabetes mellitus with other specified complication: Secondary | ICD-10-CM

## 2020-09-30 DIAGNOSIS — I10 Essential (primary) hypertension: Secondary | ICD-10-CM

## 2020-09-30 DIAGNOSIS — Z23 Encounter for immunization: Secondary | ICD-10-CM

## 2020-09-30 LAB — POCT GLYCOSYLATED HEMOGLOBIN (HGB A1C): Hemoglobin A1C: 7 % — AB (ref 4.0–5.6)

## 2020-09-30 LAB — GLUCOSE, POCT (MANUAL RESULT ENTRY): POC Glucose: 164 mg/dl — AB (ref 70–99)

## 2020-09-30 MED ORDER — METFORMIN HCL ER 500 MG PO TB24: 2000.0000 mg | ORAL_TABLET | Freq: Every day | ORAL | 1 refills | Status: DC

## 2020-09-30 MED FILL — metFORMIN HCL ER 500 MG TB2: 500 | 30 days supply | Qty: 120 | Fill #0

## 2020-09-30 NOTE — Progress Notes (Signed)
Assessment & Plan:  Haley [name] was seen today for follow-up.  Diagnoses and all orders for this visit:  Type 2 diabetes mellitus with other specified complication, without long-term current use of insulin (HCC) -     Glucose (CBG) -     HgB A1c -     metFORMIN (GLUCOPHAGE-XR) 500 MG 24 hr tablet; Take 4 tablets (2,000 mg total) by mouth at bedtime. -     Basic metabolic panel Continue blood sugar control as discussed in office today, low carbohydrate diet, and regular physical exercise as tolerated, 150 minutes per week (30 min each day, 5 days per week, or 50 min 3 days per week). Keep blood sugar logs with fasting goal of 90-130 mg/dl, post prandial (after you eat) less than 180.  For Hypoglycemia: BS <60 and Hyperglycemia BS >400; contact the clinic ASAP. Annual eye exams and foot exams are recommended.   Essential hypertension -     Basic metabolic panel Continue all antihypertensives as prescribed.  Remember to bring in your blood pressure log with you for your follow up appointment.  DASH/Mediterranean Diets are healthier choices for HTN.      Patient has been counseled on age-appropriate routine health concerns for screening and prevention. These are reviewed and up-to-date. Referrals have been placed accordingly. Immunizations are up-to-date or declined.    Subjective:   Chief Complaint  Patient presents with  . Follow-up    Pt. is here 3 months F.U.   HPI Haley [name] 54 y.o. female presents to office today for follow up  has a past medical history of Essential hypertension, Fibroids, Hyperthyroidism, Morbid obesity ([place]), Paroxysmal atrial fibrillation ([place]), Snores, and Type II diabetes mellitus ([place]).   She has not been using her BIPAP in over a week due to upper respiratory symptoms which seem to be improving. She did miss several days at work due to her symptoms.   DM TYPE 2 Well controlled. CBG today 164. LDL at goal with atorvastatin 20 mg daily.  She  has not been taking gabapentin. Still experiencing numbness in right middle finger. Currently taking metformin 1595 mg daily, trulicity 1.5 mg weekly and glipizide 10 mg BID. On renal dose ACE.  Lab Results  Component Value Date   HGBA1C 7.0 (A) 09/30/2020   Lab Results  Component Value Date   HGBA1C 7.1 (H) 07/02/2020   Lab Results  Component Value Date   [place] 66 07/02/2020   Hyperthyroidism She is seeing Endocrinology and taking metimazole and Toprol XL.    Blood pressure is well controlled. Denies chest pain, shortness of breath, palpitations, lightheadedness, dizziness, headaches or BLE edema.  BP Readings from Last 3 Encounters:  09/30/20 127/78  08/28/20 128/72  08/13/20 118/70   Review of Systems  Constitutional: Negative for fever, malaise/fatigue and weight loss.  HENT: Negative.  Negative for nosebleeds.   Eyes: Negative.  Negative for blurred vision, double vision and photophobia.  Respiratory: Negative.  Negative for cough and shortness of breath.   Cardiovascular: Negative.  Negative for chest pain, palpitations and leg swelling.  Gastrointestinal: Negative.  Negative for heartburn, nausea and vomiting.  Musculoskeletal: Negative.  Negative for myalgias.  Neurological: Positive for tingling and sensory change. Negative for dizziness, focal weakness, seizures and headaches.  Psychiatric/Behavioral: Negative.  Negative for suicidal ideas.    Past Medical History:  Diagnosis Date  . Essential hypertension   . Fibroids   . Hyperthyroidism    a. 2010, treated w/ tapazole.  [name]  Morbid obesity ([place])   . Paroxysmal atrial fibrillation ([place])    a. 03/2009 - in setting of hyperthyroidism and caffeine intake, converted spontaneously;  b. 08/2016 ED visit for recurrent PAF-->successful DCCV in ED.  [name] Snores   . Type II diabetes mellitus ([place])     Past Surgical History:  Procedure Laterality Date  . UTERINE FIBROID SURGERY      Family History  Problem Relation Age  of Onset  . Hypertension Mother   . Diabetes Mellitus II Sister   . Cancer Maternal Grandmother     Social History Reviewed with no changes to be made today.   Outpatient Medications Prior to Visit  Medication Sig Dispense Refill  . acetaminophen (TYLENOL 8 HOUR ARTHRITIS PAIN) 650 MG CR tablet Take 650 mg by mouth every 8 (eight) hours as needed for pain.    [name] atorvastatin (LIPITOR) 20 MG tablet Take 1 tablet (20 mg total) by mouth daily. 90 tablet 3  . Blood Glucose Monitoring Suppl (TRUE METRIX METER) w/Device KIT Use twice daily 1 kit 0  . ELIQUIS 5 MG TABS tablet TAKE 1 TABLET (5 MG TOTAL) BY MOUTH 2 (TWO) TIMES DAILY. 180 tablet 1  . furosemide (LASIX) 20 MG tablet Take 1 tablet (20 mg total) by mouth as needed for edema. 30 tablet 2  . glucose blood (TRUE METRIX BLOOD GLUCOSE TEST) test strip Use as instructed. Check blood glucose level by fingerstick twice per day. 200 each 12  . Insulin Pen Needle (B-D UF III MINI PEN NEEDLES) 31G X 5 MM MISC Use as instructed. Inject into the skin once nightly. 100 each 6  . methimazole (TAPAZOLE) 5 MG tablet Take 1 tablet (5 mg total) by mouth 2 (two) times daily. 180 tablet 1  . Methylsulfonylmethane (MSM PO) Take 2 tablets by mouth daily.    . metoprolol succinate (TOPROL-XL) 50 MG 24 hr tablet Take 1 tablet (50 mg total) by mouth daily. 90 tablet 3  . Multiple Vitamins-Minerals (ALIVE WOMENS 50+) TABS Take 1 tablet by mouth daily.    . TRUEplus Lancets 28G MISC Use twice daily for blood glucose check 200 each 6  . TRULICITY 1.5 HG/9.9ME SOPN INJECT 1.5 MG INTO THE SKIN ONCE A WEEK. 2 mL 2  . gabapentin (NEURONTIN) 100 MG capsule Take 1 capsule (100 mg total) by mouth at bedtime. 30 capsule 3  . glipiZIDE (GLUCOTROL) 10 MG tablet Take 1 tablet (10 mg total) by mouth 2 (two) times daily before a meal. 180 tablet 1  . lisinopril (ZESTRIL) 2.5 MG tablet Take 2 tablets (5 mg total) by mouth daily. 180 tablet 1  . metFORMIN (GLUCOPHAGE-XR) 500 MG 24  hr tablet Take 4 tablets (2,000 mg total) by mouth at bedtime. 360 tablet 1   No facility-administered medications prior to visit.    No Known Allergies     Objective:    BP 127/78 (BP Location: Left Arm, Patient Position: Sitting, Cuff Size: Large)   Pulse 80   Temp 97.7 F (36.5 C) (Temporal)   Ht _0  (1.702 m)   Wt 294 lb (133.4 kg)   SpO2 92%   BMI 46.05 kg/m  Wt Readings from Last 3 Encounters:  09/30/20 294 lb (133.4 kg)  08/28/20 299 lb (135.6 kg)  08/13/20 299 lb (135.6 kg)    Physical Exam Vitals and nursing note reviewed.  Constitutional:      Appearance: She is well-developed.  HENT:     Head: Normocephalic and atraumatic.  Cardiovascular:     Rate and Rhythm: Normal rate and regular rhythm.     Heart sounds: Normal heart sounds. No murmur heard.  No friction rub. No gallop.   Pulmonary:     Effort: Pulmonary effort is normal. No tachypnea or respiratory distress.     Breath sounds: Normal breath sounds. No decreased breath sounds, wheezing, rhonchi or rales.  Chest:     Chest wall: No tenderness.  Abdominal:     General: Bowel sounds are normal.     Palpations: Abdomen is soft.  Musculoskeletal:        General: Normal range of motion.     Cervical back: Normal range of motion.  Skin:    General: Skin is warm and dry.  Neurological:     Mental Status: She is alert and oriented to person, place, and time.     Coordination: Coordination normal.  Psychiatric:        Behavior: Behavior normal. Behavior is cooperative.        Thought Content: Thought content normal.        Judgment: Judgment normal.          Patient has been counseled extensively about nutrition and exercise as well as the importance of adherence with medications and regular follow-up. The patient was given clear instructions to go to ER or return to medical center if symptoms don't improve, worsen or new problems develop. The patient verbalized understanding.   Follow-up: Return  in about 3 months (around 12/31/2020).   [name], FNP-BC [facility], [place]   10/05/2020, 10:41 PM

## 2020-09-30 NOTE — Telephone Encounter (Signed)
Copied from [place] [id number]. Topic: General - Inquiry >> Sep 30, 2020 12:54 PM Alease Frame wrote: Reason for CRM: Pt is needing a doctors note for October 22nd and October 25th for being out of work du to sickness. Please advise

## 2020-10-01 ENCOUNTER — Ambulatory Visit: Payer: Self-pay | Admitting: Cardiovascular Disease

## 2020-10-01 LAB — BASIC METABOLIC PANEL
BUN/Creatinine Ratio: 18 (ref 9–23)
BUN: 8 mg/dL (ref 6–24)
CO2: 27 mmol/L (ref 20–29)
Calcium: 9.5 mg/dL (ref 8.7–10.2)
Chloride: 102 mmol/L (ref 96–106)
Creatinine, Ser: 0.44 mg/dL — ABNORMAL LOW (ref 0.57–1.00)
GFR calc Af Amer: 132 mL/min/{1.73_m2} (ref >59)
GFR calc non Af Amer: 115 mL/min/{1.73_m2} (ref >59)
Glucose: 121 mg/dL — ABNORMAL HIGH (ref 65–99)
Potassium: 4.5 mmol/L (ref 3.5–5.2)
Sodium: 143 mmol/L (ref 134–144)

## 2020-10-03 ENCOUNTER — Encounter: Payer: Self-pay | Admitting: Nurse Practitioner

## 2020-10-03 NOTE — Telephone Encounter (Signed)
Letter is in mychart.

## 2020-10-05 ENCOUNTER — Encounter: Payer: Self-pay | Admitting: Nurse Practitioner

## 2020-10-08 MED FILL — glipiZIDE 10 MG TABS: 10 | 30 days supply | Qty: 60 | Fill #2

## 2020-10-08 MED FILL — methIMAzole 5 MG TABS: 5 | 30 days supply | Qty: 60 | Fill #1

## 2020-10-13 ENCOUNTER — Ambulatory Visit: Payer: Self-pay | Attending: Nurse Practitioner | Admitting: Pharmacist

## 2020-10-13 ENCOUNTER — Other Ambulatory Visit: Payer: Self-pay

## 2020-10-13 DIAGNOSIS — Z23 Encounter for immunization: Secondary | ICD-10-CM

## 2020-10-13 NOTE — Progress Notes (Signed)
Patient presents for vaccination against influenza per orders of Zelda. Consent given. Counseling provided. No contraindications exists. Vaccine administered without incident.  ° °Luke Van Ausdall, PharmD, CPP °Clinical Pharmacist °[facility] °[phone] ° °

## 2020-10-22 MED FILL — GABAPENTIN 100 MG CAPSULE: 100 | 30 days supply | Qty: 30 | Fill #1

## 2020-10-28 MED FILL — METOPROLOL SUCCINATE ER 50: 50 | 30 days supply | Qty: 30 | Fill #3

## 2020-10-28 MED FILL — ATORVASTATIN CALCIUM 20 MG: 20 | 30 days supply | Qty: 30 | Fill #6

## 2020-11-06 MED FILL — ATORVASTATIN CALCIUM 20 MG: 20 | 30 days supply | Qty: 30 | Fill #6

## 2020-11-06 MED FILL — METOPROLOL SUCCINATE ER 50: 50 | 30 days supply | Qty: 30 | Fill #3

## 2020-11-06 MED FILL — methIMAzole 5 MG TABS: 5 | 30 days supply | Qty: 60 | Fill #2

## 2020-11-06 MED FILL — glipiZIDE 10 MG TABS: 10 | 30 days supply | Qty: 60 | Fill #3

## 2020-11-06 MED FILL — metFORMIN HCL ER 500 MG TB2: 500 | 30 days supply | Qty: 120 | Fill #1

## 2020-11-06 MED FILL — LISINOPRIL 2.5 MG TABLET: 2.5 | 30 days supply | Qty: 60 | Fill #3

## 2020-11-20 ENCOUNTER — Telehealth: Payer: Self-pay | Admitting: Internal Medicine

## 2020-11-20 ENCOUNTER — Ambulatory Visit: Payer: Self-pay | Admitting: *Deleted

## 2020-11-20 NOTE — Telephone Encounter (Signed)
No, methimazole does not have an effect on her menstrual cycles, but she may have a change in menstrual cycles with resolution of her Graves' disease.  I would suggest to continue methimazole for now.

## 2020-11-20 NOTE — Telephone Encounter (Signed)
Patient is reporting abnormal uterine bleeding. Patient is post menopausal she has not had cycle in several years. Patient states she has had spotting to light bleeding for 3 weeks now and it is beginning to effect how she feels. Office is closed for lunch now- so unable to contact for appointment- Patient does use [employer] so that puts her at higher risk- will send message to office for scheduling.  Reason for Disposition . Taking Coumadin (warfarin) or other strong blood thinner, or known bleeding disorder (e.g., thrombocytopenia)  Answer Assessment - Initial Assessment Questions 1. AMOUNT: "Describe the bleeding that you are having." "How much bleeding is there?"    - SPOTTING: spotting, or pinkish / brownish mucous discharge; does not fill panti-liner or pad    - MILD:  less than 1 pad / hour; less than patient's usual menstrual bleeding   - MODERATE: 1-2 pads / hour; 1 menstrual cup every 6 hours; small-medium blood clots (e.g., pea, grape, small coin)   - SEVERE: soaking 2 or more pads/hour for 2 or more hours; 1 menstrual cup every 2 hours; bleeding not contained by pads or continuous red blood from vagina; large blood clots (e.g., golf ball, large coin)      Spotting/mild 2. ONSET: "When did the bleeding begin?" "Is it continuing now?"     3 weeks 3. MENOPAUSE: "When was your last menstrual period?"      Last cycle well over 1 year ago 4. ABDOMINAL PAIN: "Do you have any pain?" "How bad is the pain?"  (e.g., Scale 1-10; mild, moderate, or severe)   - MILD (1-3): doesn't interfere with normal activities, abdomen soft and not tender to touch    - MODERATE (4-7): interferes with normal activities or awakens from sleep, tender to touch    - SEVERE (8-10): excruciating pain, doubled over, unable to do any normal activities      No- maybe some mild cramping 5. BLOOD THINNERS: "Do you take any blood thinners?" (e.g., Coumadin/warfarin, Pradaxa/dabigatran, aspirin)     Yes- Eliquis  6.  HORMONES: "Are you taking any hormone medications, prescription or OTC?" (e.g., birth control pills, estrogen)     no 7. CAUSE: "What do you think is causing the bleeding?" (e.g., recent gyn surgery, recent gyn procedure; known bleeding disorder, uterine cancer)       unknown 8. HEMODYNAMIC STATUS: "Are you weak or feeling lightheaded?" If Yes, ask: "Can you stand and walk normally?"       Yes- feels tired- lack of energy 9. OTHER SYMPTOMS: "What other symptoms are you having with the bleeding?" (e.g., back pain, burning with urination, fever)     no  Protocols used: VAGINAL BLEEDING - POSTMENOPAUSAL-A-AH

## 2020-11-20 NOTE — Telephone Encounter (Signed)
Patient requests to be called at ph# [phone] re: Patient wants to know if Methimazole is causing her to have menstrual bleeding

## 2020-11-20 NOTE — Telephone Encounter (Signed)
Called patient, advised of message below-  Patient verbalized understanding.  Will call back if symptoms do not resolve.

## 2020-11-20 NOTE — Telephone Encounter (Signed)
Please advise if this could cause that?  Thank you!

## 2020-11-24 ENCOUNTER — Other Ambulatory Visit: Payer: Self-pay

## 2020-11-24 ENCOUNTER — Ambulatory Visit: Payer: Self-pay | Admitting: Physician Assistant

## 2020-11-24 VITALS — BP 138/78 | HR 73 | Temp 98.0°F | Resp 20 | Ht 67.0 in | Wt 301.4 lb

## 2020-11-24 DIAGNOSIS — N950 Postmenopausal bleeding: Secondary | ICD-10-CM

## 2020-11-24 DIAGNOSIS — E0590 Thyrotoxicosis, unspecified without thyrotoxic crisis or storm: Secondary | ICD-10-CM

## 2020-11-24 NOTE — Patient Instructions (Addendum)
Your Username is [place]ell Your new password is Covid  You can change this password when you log in  We will call you with the lab results and any medication changes that need to be made.   Kennieth Rad, PA-C Physician Assistant [place] [email or web address]    Postmenopausal Bleeding  Postmenopausal bleeding is any bleeding that a woman has after she has entered into menopause. Menopause is the end of a woman's fertile years. After menopause, a woman no longer ovulates and does not have menstrual periods. Postmenopausal bleeding may have various causes, including:  Menopausal hormone therapy (MHT).  Endometrial atrophy. After menopause, low estrogen hormone levels cause the membrane that lines the uterus (endometrium) to become thinner. You may have bleeding as the endometrium thins.  Endometrial hyperplasia. This condition is caused by excess estrogen hormones and low levels of progesterone hormones. The excess estrogen causes the endometrium to thicken, which can lead to bleeding. In some cases, this can lead to cancer of the uterus.  Endometrial cancer.  Non-cancerous growths (polyps) on the endometrium, the lining of the uterus, or the cervix.  Uterine fibroids. These are non-cancerous growths in or around the uterus muscle tissue that can cause heavy bleeding. Any type of postmenopausal bleeding, even if it appears to be a typical menstrual period, should be evaluated by your health care provider. Treatment will depend on the cause of the bleeding. Follow these instructions at home:  Pay attention to any changes in your symptoms.  Avoid using tampons and douches as told by your health care provider.  Change your pads regularly.  Get regular pelvic exams and Pap tests.  Take iron supplements as told by your health care provider.  Take over-the-counter and prescription medicines only as told by your health care  provider.  Keep all follow-up visits as told by your health care provider. This is important. Contact a health care provider if:  Your bleeding lasts more than 1 week.  You have abdominal pain.  You have bleeding with or after sexual intercourse.  You have bleeding that happens more often than every 3 weeks. Get help right away if:  You have a fever, chills, headache, dizziness, muscle aches, and bleeding.  You have severe pain with bleeding.  You are passing blood clots.  You have heavy bleeding, need more than 1 pad an hour, and have never experienced this before.  You feel faint. Summary  Postmenopausal bleeding is any bleeding that a woman has after she has entered into menopause.  Postmenopausal bleeding may have various causes. Treatment will depend on the cause of the bleeding.  Any type of postmenopausal bleeding, even if it appears to be a typical menstrual period, should be evaluated by your health care provider.  Be sure to pay attention to any changes in your symptoms and keep all follow-up visits as told by your health care provider. This information is not intended to replace advice given to you by your health care provider. Make sure you discuss any questions you have with your health care provider. Document Revised: 02/28/2018 Document Reviewed: 02/14/2017 Elsevier Patient Education  [place].

## 2020-11-24 NOTE — Progress Notes (Signed)
Established Patient Office Visit  Subjective:  Patient ID: Haley [name], female    DOB: [birth year]  Age: 54 y.o. MRN: [id number]  CC:  Chief Complaint  Patient presents with   Menstrual Problem    Spotting x 3 wks    HPI Haley [name] reports that she has been having some abnormal vaginal bleeding, describes it as spotting for the past 3 weeks.  States in the last week she has had brief episodes of dizziness, denies syncope, states that she has had heart palpitations, denies shortness of breath.  Reports that she has not had a full menses in the past 5 years  Reports that she has been to her thyroid medication, does endorse that she has missed an occasional dose.  Reports that she is working on being compliant to all of her medications.  Reports very little water intake on a daily basis.  No new stressors  Past Medical History:  Diagnosis Date   Essential hypertension    Fibroids    Hyperthyroidism    a. 2010, treated w/ tapazole.   Morbid obesity ([place])    Paroxysmal atrial fibrillation ([place])    a. 03/2009 - in setting of hyperthyroidism and caffeine intake, converted spontaneously;  b. 08/2016 ED visit for recurrent PAF-->successful DCCV in ED.   Snores    Type II diabetes mellitus ([facility])     Past Surgical History:  Procedure Laterality Date   UTERINE FIBROID SURGERY      Family History  Problem Relation Age of Onset   Hypertension Mother    Diabetes Mellitus II Sister    Cancer Maternal Grandmother     Social History   Socioeconomic History   Marital status: Married    Spouse name: Not on file   Number of children: Not on file   Years of education: Not on file   Highest education level: Not on file  Occupational History   Occupation: cosmetologist  Tobacco Use   Smoking status: Former Smoker    Packs/day: 1.00    Years: 32.00    Pack years: 32.00    Quit date: 2012    Years since quitting: 9.9   Smokeless tobacco: Never  Used   Tobacco comment: patient vapes  Vaping Use   Vaping Use: Never used  Substance and Sexual Activity   Alcohol use: No    Comment: occasional drink.   Drug use: No   Sexual activity: Yes  Other Topics Concern   Not on file  Social History Narrative   Lives in [place] with husband.  Systems analyst.  Also in school @ [employer] for [employer].  Does not routinely exercise.   Social Determinants of Health   Financial Resource Strain: Not on file  Food Insecurity: Not on file  Transportation Needs: Not on file  Physical Activity: Not on file  Stress: Not on file  Social Connections: Not on file  Intimate Partner Violence: Not on file    Outpatient Medications Prior to Visit  Medication Sig Dispense Refill   acetaminophen (TYLENOL) 650 MG CR tablet Take 650 mg by mouth every 8 (eight) hours as needed for pain.     atorvastatin (LIPITOR) 20 MG tablet Take 1 tablet (20 mg total) by mouth daily. 90 tablet 3   Blood Glucose Monitoring Suppl (TRUE METRIX METER) w/Device KIT Use twice daily 1 kit 0   ELIQUIS 5 MG TABS tablet TAKE 1 TABLET (5 MG TOTAL) BY MOUTH 2 (TWO) TIMES DAILY. [place]  tablet 1   furosemide (LASIX) 20 MG tablet Take 1 tablet (20 mg total) by mouth as needed for edema. 30 tablet 2   glucose blood (TRUE METRIX BLOOD GLUCOSE TEST) test strip Use as instructed. Check blood glucose level by fingerstick twice per day. 200 each 12   Insulin Pen Needle (B-D UF III MINI PEN NEEDLES) 31G X 5 MM MISC Use as instructed. Inject into the skin once nightly. 100 each 6   metFORMIN (GLUCOPHAGE-XR) 500 MG 24 hr tablet Take 4 tablets (2,000 mg total) by mouth at bedtime. 360 tablet 1   methimazole (TAPAZOLE) 5 MG tablet Take 1 tablet (5 mg total) by mouth 2 (two) times daily. 180 tablet 1   Methylsulfonylmethane (MSM PO) Take 2 tablets by mouth daily.     metoprolol succinate (TOPROL-XL) 50 MG 24 hr tablet Take 1 tablet (50 mg total) by mouth daily. 90 tablet 3    Multiple Vitamins-Minerals (ALIVE WOMENS 50+) TABS Take 1 tablet by mouth daily.     TRUEplus Lancets 28G MISC Use twice daily for blood glucose check 532 each 6   TRULICITY 1.5 DJ/2.4QA SOPN INJECT 1.5 MG INTO THE SKIN ONCE A WEEK. 2 mL 2   gabapentin (NEURONTIN) 100 MG capsule Take 1 capsule (100 mg total) by mouth at bedtime. 30 capsule 3   glipiZIDE (GLUCOTROL) 10 MG tablet Take 1 tablet (10 mg total) by mouth 2 (two) times daily before a meal. 180 tablet 1   lisinopril (ZESTRIL) 2.5 MG tablet Take 2 tablets (5 mg total) by mouth daily. 180 tablet 1   No facility-administered medications prior to visit.    No Known Allergies  ROS Review of Systems  Constitutional: Negative.   HENT: Negative.   Respiratory: Negative for shortness of breath and wheezing.   Cardiovascular: Positive for palpitations. Negative for chest pain.  Endocrine: Negative.   Genitourinary: Positive for vaginal bleeding. Negative for dysuria, vaginal discharge and vaginal pain.  Musculoskeletal: Negative.   Skin: Negative.   Allergic/Immunologic: Negative.   Neurological: Positive for dizziness. Negative for seizures, syncope, speech difficulty, weakness and headaches.  Hematological: Negative.   Psychiatric/Behavioral: Negative.       Objective:    Physical Exam Vitals and nursing note reviewed.  Constitutional:      General: She is not in acute distress.    Appearance: Normal appearance. She is not ill-appearing.  HENT:     Head: Normocephalic and atraumatic.     Right Ear: External ear normal.     Left Ear: External ear normal.     Nose: Nose normal.     Mouth/Throat:     Mouth: Mucous membranes are moist.     Pharynx: Oropharynx is clear.  Eyes:     Extraocular Movements: Extraocular movements intact.     Conjunctiva/sclera: Conjunctivae normal.     Pupils: Pupils are equal, round, and reactive to light.  Cardiovascular:     Rate and Rhythm: Normal rate and regular rhythm.     Pulses:  Normal pulses.     Heart sounds: Normal heart sounds.  Pulmonary:     Effort: Pulmonary effort is normal.     Breath sounds: Normal breath sounds.  Abdominal:     General: Abdomen is flat.     Palpations: Abdomen is soft.     Tenderness: There is no abdominal tenderness.  Musculoskeletal:        General: Normal range of motion.     Cervical back: Normal range of  motion and neck supple.  Skin:    General: Skin is warm and dry.  Neurological:     General: No focal deficit present.     Mental Status: She is alert and oriented to person, place, and time.  Psychiatric:        Mood and Affect: Mood normal.        Behavior: Behavior normal.        Thought Content: Thought content normal.        Judgment: Judgment normal.     BP 138/78 (BP Location: Left Arm, Patient Position: Sitting, Cuff Size: Large)    Pulse 73    Temp 98 F (36.7 C) (Oral)    Resp 20    Ht _0  (1.702 m)    Wt (!) 301 lb 6.4 oz (136.7 kg)    SpO2 93%    BMI 47.21 kg/m  Wt Readings from Last 3 Encounters:  11/24/20 (!) 301 lb 6.4 oz (136.7 kg)  09/30/20 294 lb (133.4 kg)  08/28/20 299 lb (135.6 kg)     Health Maintenance Due  Topic Date Due   OPHTHALMOLOGY EXAM  Never done   MAMMOGRAM  Never done   FOOT EXAM  02/19/2020   COVID-19 Vaccine (2 - Pfizer 3-dose booster series) 04/12/2020    There are no preventive care reminders to display for this patient.  Lab Results  Component Value Date   TSH 12.500 (H) 11/24/2020   Lab Results  Component Value Date   WBC 9.3 11/24/2020   HGB 13.7 11/24/2020   HCT 42.1 11/24/2020   MCV 94 11/24/2020   PLT 250 11/24/2020   Lab Results  Component Value Date   NA 140 11/24/2020   K 4.6 11/24/2020   CO2 27 09/30/2020   GLUCOSE 81 11/24/2020   BUN 11 11/24/2020   CREATININE 0.49 (L) 11/24/2020   BILITOT 0.4 11/24/2020   ALKPHOS 69 11/24/2020   AST 18 11/24/2020   ALT 17 07/02/2020   PROT 7.5 11/24/2020   ALBUMIN 4.7 11/24/2020   CALCIUM 9.5  11/24/2020   ANIONGAP 9 12/24/2019   Lab Results  Component Value Date   CHOL 131 07/02/2020   Lab Results  Component Value Date   HDL 53 07/02/2020   Lab Results  Component Value Date   LDLCALC 66 07/02/2020   Lab Results  Component Value Date   TRIG 56 07/02/2020   Lab Results  Component Value Date   CHOLHDL 2.5 07/02/2020   Lab Results  Component Value Date   HGBA1C 7.0 (A) 09/30/2020      Assessment & Plan:   Problem List Items Addressed This Visit      Endocrine   Hyperthyroidism - Primary   Relevant Orders   Thyroid Panel With TSH (Completed)    Other Visit Diagnoses    Abnormal vaginal bleeding in postmenopausal patient       Relevant Orders   CBC with Differential/Platelet (Completed)   Comp. Metabolic Panel (12) (Completed)    1. Hyperthyroidism Patient is due for repeat thyroid labs with endocrinology at the end of October 2021, will repeat today.  Encouraged patient for prompt follow-up with endocrinology, currently has one scheduled for March 2022. - Thyroid Panel With TSH  2. Abnormal vaginal bleeding in postmenopausal patient Recent Pap December 2020 was within normal limits - CBC with Differential/Platelet - Comp. Metabolic Panel (12)  I have reviewed the patient's medical history (PMH, PSH, Social History, Family History, Medications, and allergies) ,  and have been updated if relevant. I spent 30 minutes reviewing chart and  face to face time with patient.     No orders of the defined types were placed in this encounter.   Follow-up: Return if symptoms worsen or fail to improve.    Loraine Grip Mayers, PA-C

## 2020-11-24 NOTE — Progress Notes (Signed)
Spotting x 3 wks, causing dizziness, shortness of breath and racing pulse, had not had period in over 5 yrs

## 2020-11-25 DIAGNOSIS — N950 Postmenopausal bleeding: Secondary | ICD-10-CM | POA: Insufficient documentation

## 2020-11-25 LAB — COMP. METABOLIC PANEL (12)
AST: 18 IU/L (ref 0–40)
Albumin/Globulin Ratio: 1.7 (ref 1.2–2.2)
Albumin: 4.7 g/dL (ref 3.8–4.9)
Alkaline Phosphatase: 69 IU/L (ref 44–121)
BUN/Creatinine Ratio: 22 (ref 9–23)
BUN: 11 mg/dL (ref 6–24)
Bilirubin Total: 0.4 mg/dL (ref 0.0–1.2)
Calcium: 9.5 mg/dL (ref 8.7–10.2)
Chloride: 100 mmol/L (ref 96–106)
Creatinine, Ser: 0.49 mg/dL — ABNORMAL LOW (ref 0.57–1.00)
GFR calc Af Amer: 128 mL/min/{1.73_m2} (ref >59)
GFR calc non Af Amer: 111 mL/min/{1.73_m2} (ref >59)
Globulin, Total: 2.8 g/dL (ref 1.5–4.5)
Glucose: 81 mg/dL (ref 65–99)
Potassium: 4.6 mmol/L (ref 3.5–5.2)
Sodium: 140 mmol/L (ref 134–144)
Total Protein: 7.5 g/dL (ref 6.0–8.5)

## 2020-11-25 LAB — CBC WITH DIFFERENTIAL/PLATELET
Basophils Absolute: 0.1 10*3/uL (ref 0.0–0.2)
Basos: 1 %
EOS (ABSOLUTE): 0.1 10*3/uL (ref 0.0–0.4)
Eos: 2 %
Hematocrit: 42.1 % (ref 34.0–46.6)
Hemoglobin: 13.7 g/dL (ref 11.1–15.9)
Immature Grans (Abs): 0 10*3/uL (ref 0.0–0.1)
Immature Granulocytes: 0 %
Lymphocytes Absolute: 3.5 10*3/uL — ABNORMAL HIGH (ref 0.7–3.1)
Lymphs: 38 %
MCH: 30.6 pg (ref 26.6–33.0)
MCHC: 32.5 g/dL (ref 31.5–35.7)
MCV: 94 fL (ref 79–97)
Monocytes Absolute: 0.8 10*3/uL (ref 0.1–0.9)
Monocytes: 8 %
Neutrophils Absolute: 4.7 10*3/uL (ref 1.4–7.0)
Neutrophils: 51 %
Platelets: 250 10*3/uL (ref 150–450)
RBC: 4.47 x10E6/uL (ref 3.77–5.28)
RDW: 12.9 % (ref 11.7–15.4)
WBC: 9.3 10*3/uL (ref 3.4–10.8)

## 2020-11-25 LAB — THYROID PANEL WITH TSH
Free Thyroxine Index: 0.7 — ABNORMAL LOW (ref 1.2–4.9)
T3 Uptake Ratio: 25 % (ref 24–39)
T4, Total: 2.6 ug/dL — ABNORMAL LOW (ref 4.5–12.0)
TSH: 12.5 u[IU]/mL — ABNORMAL HIGH (ref 0.450–4.500)

## 2020-11-26 ENCOUNTER — Telehealth: Payer: Self-pay | Admitting: *Deleted

## 2020-11-26 ENCOUNTER — Telehealth: Payer: Self-pay

## 2020-11-26 DIAGNOSIS — E0500 Thyrotoxicosis with diffuse goiter without thyrotoxic crisis or storm: Secondary | ICD-10-CM

## 2020-11-26 MED ORDER — METHIMAZOLE 5 MG PO TABS: 5.0000 mg | ORAL_TABLET | Freq: Every day | ORAL | 1 refills | Status: DC

## 2020-11-26 NOTE — Telephone Encounter (Signed)
Order placed for TSH Free T3 and T4. Med list updated to reflect new instructions.

## 2020-11-26 NOTE — Telephone Encounter (Signed)
T, I reviewed the labs and I definitely agree with the reduction in dose.  We need to repeat her tests again in 4 weeks.  Can you please order a TSH, free T4, free T3 and also change her methimazole dose on the medication list to only 5 mg daily?

## 2020-11-26 NOTE — Telephone Encounter (Signed)
Inbound call from patient advising PCP's office called with lab results and advised pt needs to reduce dose of Methimazole from 2 tablets to 1 tablet daily.

## 2020-11-26 NOTE — Telephone Encounter (Signed)
-----   Message from Kennieth Rad, [place] sent at 11/25/2020  9:32 AM EST ----- Please call patient and let her know that her kidney and liver function are within normal limits, she does not show any signs of anemia.  Her thyroid function has increased, causing her to have hypothyroid, she needs to reduce her medication to once daily, have thyroid function rechecked in 6 weeks.  She needs to make sure she has prompt follow-up with endocrinology.

## 2020-11-26 NOTE — Telephone Encounter (Signed)
Patient verified DOB Patient is aware of labs being normal except for TSH  Being increased and needing to take 1 capsule a day and have a recheck in 6 weeks. Patient is aware of needing to FU with endo as well.

## 2020-11-26 NOTE — Telephone Encounter (Signed)
Medical Assistant left message on patient's home and cell voicemail. Voicemail states to give a call back to Meaghan Whistler with [facility] at [phone]. 

## 2020-12-07 MED FILL — ATORVASTATIN CALCIUM 20 MG: 20 | 30 days supply | Qty: 30 | Fill #7

## 2020-12-07 MED FILL — glipiZIDE 10 MG TABS: 10 | 30 days supply | Qty: 60 | Fill #4

## 2020-12-07 MED FILL — METOPROLOL SUCCINATE ER 50: 50 | 30 days supply | Qty: 30 | Fill #4

## 2020-12-18 MED FILL — metFORMIN HCL ER 500 MG TB2: 500 | 30 days supply | Qty: 120 | Fill #2

## 2020-12-18 MED FILL — !ELIQUIS 5MG TABLET: 5 | 30 days supply | Qty: 60 | Fill #1

## 2020-12-23 ENCOUNTER — Other Ambulatory Visit: Payer: Self-pay | Admitting: Nurse Practitioner

## 2020-12-23 DIAGNOSIS — E1169 Type 2 diabetes mellitus with other specified complication: Secondary | ICD-10-CM

## 2020-12-23 MED FILL — !TRULICITY 1.5 MG/0.5 ML PE: 1.5 | 28 days supply | Qty: 2 | Fill #0

## 2021-01-01 ENCOUNTER — Ambulatory Visit: Payer: Self-pay | Admitting: Nurse Practitioner

## 2021-01-05 MED FILL — ATORVASTATIN CALCIUM 20 MG: 20 | 30 days supply | Qty: 30 | Fill #8

## 2021-01-05 MED FILL — methIMAzole 5 MG TABS: 5 | 30 days supply | Qty: 60 | Fill #3

## 2021-01-05 MED FILL — METOPROLOL SUCCINATE ER 50: 50 | 30 days supply | Qty: 30 | Fill #5

## 2021-01-05 MED FILL — GABAPENTIN 100 MG CAPSULE: 100 | 30 days supply | Qty: 30 | Fill #2

## 2021-01-08 MED FILL — glipiZIDE 10 MG TABS: 10 | 30 days supply | Qty: 60 | Fill #5

## 2021-01-19 MED FILL — metFORMIN HCL ER 500 MG TB2: 500 | 30 days supply | Qty: 120 | Fill #3

## 2021-01-19 MED FILL — LISINOPRIL 2.5 MG TABLET: 2.5 | 30 days supply | Qty: 60 | Fill #4

## 2021-01-19 MED FILL — $ELIQUIS 5 MG TABLET: 5 | 60 days supply | Qty: 120 | Fill #2

## 2021-01-27 MED FILL — $TRULICITY 1.5 MG/0.5 ML PE: 1.5 | 56 days supply | Qty: 4 | Fill #1

## 2021-02-02 MED FILL — ATORVASTATIN CALCIUM 20 MG: 20 | 30 days supply | Qty: 30 | Fill #9

## 2021-02-09 MED FILL — METOPROLOL SUCCINATE ER 50: 50 | 30 days supply | Qty: 30 | Fill #6

## 2021-02-15 ENCOUNTER — Ambulatory Visit (INDEPENDENT_AMBULATORY_CARE_PROVIDER_SITE_OTHER): Payer: Self-pay | Admitting: Cardiovascular Disease

## 2021-02-15 ENCOUNTER — Encounter: Payer: Self-pay | Admitting: Cardiovascular Disease

## 2021-02-15 ENCOUNTER — Other Ambulatory Visit: Payer: Self-pay

## 2021-02-15 VITALS — BP 140/66 | HR 90 | Ht 67.0 in | Wt 298.8 lb

## 2021-02-15 DIAGNOSIS — I5032 Chronic diastolic (congestive) heart failure: Secondary | ICD-10-CM

## 2021-02-15 DIAGNOSIS — Z7901 Long term (current) use of anticoagulants: Secondary | ICD-10-CM

## 2021-02-15 DIAGNOSIS — G4733 Obstructive sleep apnea (adult) (pediatric): Secondary | ICD-10-CM

## 2021-02-15 DIAGNOSIS — E785 Hyperlipidemia, unspecified: Secondary | ICD-10-CM

## 2021-02-15 DIAGNOSIS — E0500 Thyrotoxicosis with diffuse goiter without thyrotoxic crisis or storm: Secondary | ICD-10-CM

## 2021-02-15 DIAGNOSIS — E119 Type 2 diabetes mellitus without complications: Secondary | ICD-10-CM

## 2021-02-15 DIAGNOSIS — I10 Essential (primary) hypertension: Secondary | ICD-10-CM

## 2021-02-15 NOTE — Progress Notes (Signed)
Cardiology Office Note    Date:  02/21/2021   ID:  ADANYA [name], DOB 10/28/1966, MRN [id number]  PCP:  Haley Pounds, NP  Cardiologist:  [name], MD (sleep)  6 month evaluation initial follow-up after she had received a BiPAP unit  History of Present Illness:  Haley [name] is a 55 y.o. female who presents for a 6 month follow-up sleep  evaluation.  [name] has a history of PAF, which initially occurred in the setting of hyperthyroidism in 2010.  She recently developed recurrent paroxysmal atrial fibrillation and underwent cardioversion in the ED in September 2017.  She has a history of type 2 diabetes mellitus, morbid obesity, hypertension, and she had recently seen [name] in November 2017.  At that time, she was started on beta blocker therapy.  She was started on eliquis anticoagulation.  Due to concerns for obstructive sleep apnea.  She was referred for a sleep study which was done on 12/13/2016.  She had severe sleep apnea with Haley AHI of 111.2 per hour, Haley RDI of 113.  AHI during REM sleep was 74.2.  She had severe oxygen desaturation to 51%.  She underwent a CPAP, and ultimate BiPAP titration on 01/04/2017.  On her titration study CPAP was advanced to 19 cm water pressure but due to continued events, BiPAP was implemented and she required maximum titration up to 25/21.  At 23/19, AHI was still elevated 6.4, but 25/21 at AHI was 0.  Apparently, the patient had concerns about cost since she currently does not have insurance.  She was wondering about using a CPAP from a family member since he no longer uses treatment.  I was concerned that CPAP may not be as beneficial.  When I saw her in July 2018, she  brought the machine with her to the office today for our review and assessment.  She was given a ResMed S9 and had a  full face mas given to her the time of first titration study.  At that time she was going  to bed around 1 AM and waking up for good  around noon.  She had frequent awakenings during the night and daytime sleepiness.  Epworth Sleepiness Scale: Situation   Chance of Dozing/Sleeping (0 = never , 1 = slight chance , 2 = moderate chance , 3 = high chance )   sitting and reading 2   watching TV 2   sitting inactive in a public place 2   being a passenger in a motor vehicle for Haley hour or more 1   lying down in the afternoon 2   sitting and talking to someone 1   sitting quietly after lunch (no alcohol) 2   while stopped for a few minutes in traffic as the driver 2   Total Score  14   When I saw her, I recommended that her CPAP unit  adjusted to it 20 cm maximum pressure.  We had set up the process through assisted support for her to try to get a BiPAP machine.  She did reach out to [facility] since this was the DME company that had supplied her friend with the CPAP unit that she was using.  She has felt improved since using CPAP therapy.  However, she still admits to fatigue.  She feels  that in the middle of night she was not getting enough air and for this reason , was taking her mask off when she felt  the sensation.  When I saw her in March 2020 he had not received a BiPAP machine she did not bring the CPAP of her friend who she was using for interrogation.  When I  saw her in August 2020, she was still having issues with sleep and admitted to no energy, poor sleep, snoring, and nocturia at least 3 times per night.  She is now working doing senior sitting.  She still does not have insurance.  She is unaware of any recurrent or fibrillation and continued to be on Eliquis for anticoagulation in addition to metoprolol succinate 50 mg in the morning and 25 mg in the evening.  She is diabetic on Trulicity in addition to metformin and glipizide.  She is on atorvastatin 20 mg daily for hyperlipidemia.  She was recently admitted to hospital in January 2021 with acute hypoxic respiratory failure secondary to acute diastolic CHF  contributed by untreated sleep apnea.  CTA was negative for PE although she had small airway disease.  Lower extremity Dopplers were negative for DVT.  Haley echo Doppler study revealed Haley EF of 60 to 65%, moderate LVH without wall motion abnormality.  She was treated with a 6.  She was maintaining sinus rhythm on beta-blocker and Eliquis.  Prior to her hospitalization she was not using her CPAP therapy.  Care management was consulted instantly with help from the heart and vascular patient find she ultimately was able to receive a new ResMed air curve BiPAP unit with set up date December 27, 2019.    I saw her in the office on January 16, 2020 for follow-up evaluation.  During that in office evaluation I was unable to obtain data since she had not been linked to our office.  However subsequently we were able to get her BiPAP unit linked  to our office.  Since her set up date of December 27, 2019 she has used it all days with the exception of 1.  Her EPAP pressure is set at 10 with IPAP pressure of 18.  AHI is 4.4 with a central index of 1.4.  She has felt significantly improved since reinitiating therapy and feels significantly better with compared to her prior CPAP.  She is unaware of any breakthrough snoring.  She is also having oxygen supplementation through her BiPAP unit.  She is try to make dietary adjustments with her morbid obesity.  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  She was seen in follow-up in May 2021 and over the previous  several months, she has felt well.  I obtained a download from March 29, 2020 through Apr 27, 2020.  Compliance was suboptimal with only 60% of usage days.  However this is explained by the fact that her husband was in the hospital for several weeks and she was sleeping in the hospital in a recliner and not at home with her BiPAP unit.  Pressures are set at Haley EPAP of 10 and IPAP of 19 cm of water.  AHI is 2.7.  A new Epworth Sleepiness Scale  score was calculated in the office today and this endorsed at 10.  During that evaluation I discussed the importance of optimal use between 7 and 8 hours per night.  She felt more rested when using BiPAP therapy.  I last saw her in September 2021 at which time she denied any chest pain and was unaware of any recurrent episodes of atrial fibrillation. She has used BiPAP but she  does not believe she needs to separate compact oxygen concentrator since she has supplemental oxygen tanks at home.  The insurance company will not discontinue this unless they get my approval.  I obtained a download from July 7 through August 5.  She again is having suboptimal compliance with only 50% of usage days and usage average at 4 hours and 4 minutes.  At her 19/10 BiPAP pressure AHI, however is very good at 2.4.  Her tubing broke which explains her nonuse since July 09, 2020.  She had been contacting [employer] but was never able to get it a replacement hose.  She has not been successful with weight loss.   Since I last saw her, she has experienced some shortness of breath intermittently particularly when walking up hills but actually has felt improved recently.  A download was obtained from February 9 through February 11, 2021 which shows 100% compliance.  Average use is 5 hours and 47 minutes.  She is on a pressure of 19/10.  AHI is 2.7/h.  [employer] is her DME company.  Haley Epworth sleepiness scale was calculated in the office today and this endorsed at 6 arguing against residual daytime sleepiness.  She has not been successful with weight loss.  She presents for reevaluation.  Past Medical History:  Diagnosis Date  . Essential hypertension   . Fibroids   . Hyperthyroidism    a. 2010, treated w/ tapazole.  . Morbid obesity ([place])   . Paroxysmal atrial fibrillation ([place])    a. 03/2009 - in setting of hyperthyroidism and caffeine intake, converted spontaneously;  b. 08/2016 ED visit for recurrent PAF-->successful DCCV in ED.  [name]  Snores   . Type II diabetes mellitus ([place])     Past Surgical History:  Procedure Laterality Date  . UTERINE FIBROID SURGERY      Current Medications: Outpatient Medications Prior to Visit  Medication Sig Dispense Refill  . atorvastatin (LIPITOR) 20 MG tablet Take 1 tablet (20 mg total) by mouth daily. 90 tablet 3  . Blood Glucose Monitoring Suppl (TRUE METRIX METER) w/Device KIT Use twice daily 1 kit 0  . ELIQUIS 5 MG TABS tablet TAKE 1 TABLET (5 MG TOTAL) BY MOUTH 2 (TWO) TIMES DAILY. 180 tablet 1  . furosemide (LASIX) 20 MG tablet Take 1 tablet (20 mg total) by mouth as needed for edema. 30 tablet 2  . gabapentin (NEURONTIN) 100 MG capsule Take 1 capsule (100 mg total) by mouth at bedtime. 30 capsule 3  . glipiZIDE (GLUCOTROL) 10 MG tablet Take 1 tablet (10 mg total) by mouth 2 (two) times daily before a meal. 180 tablet 1  . glucose blood (TRUE METRIX BLOOD GLUCOSE TEST) test strip Use as instructed. Check blood glucose level by fingerstick twice per day. 200 each 12  . lisinopril (ZESTRIL) 2.5 MG tablet Take 2 tablets (5 mg total) by mouth daily. 180 tablet 1  . metFORMIN (GLUCOPHAGE-XR) 500 MG 24 hr tablet Take 4 tablets (2,000 mg total) by mouth at bedtime. 360 tablet 1  . methimazole (TAPAZOLE) 5 MG tablet Take 1 tablet (5 mg total) by mouth daily. 90 tablet 1  . Methylsulfonylmethane (MSM PO) Take 2 tablets by mouth daily.    . metoprolol succinate (TOPROL-XL) 50 MG 24 hr tablet Take 1 tablet (50 mg total) by mouth daily. 90 tablet 3  . Multiple Vitamins-Minerals (ALIVE WOMENS 50+) TABS Take 1 tablet by mouth daily.    . naproxen sodium (ALEVE) 220 MG tablet Take 220  mg by mouth daily as needed.    . TRUEplus Lancets 28G MISC Use twice daily for blood glucose check 200 each 6  . TRULICITY 1.5 JJ/9.4RD SOPN INJECT 1.5 MG INTO THE SKIN ONCE A WEEK. 6 mL 0  . acetaminophen (TYLENOL) 650 MG CR tablet Take 650 mg by mouth every 8 (eight) hours as needed for pain.    . Insulin Pen  Needle (B-D UF III MINI PEN NEEDLES) 31G X 5 MM MISC Use as instructed. Inject into the skin once nightly. 100 each 6   No facility-administered medications prior to visit.     Allergies:   Patient has no allergy information on record.   Social History   Socioeconomic History  . Marital status: Married    Spouse name: Not on file  . Number of children: Not on file  . Years of education: Not on file  . Highest education level: Not on file  Occupational History  . Occupation: cosmetologist  Tobacco Use  . Smoking status: Former Smoker    Packs/day: 1.00    Years: 32.00    Pack years: 32.00    Quit date: 2012    Years since quitting: 10.2  . Smokeless tobacco: Never Used  . Tobacco comment: patient vapes  Vaping Use  . Vaping Use: Never used  Substance and Sexual Activity  . Alcohol use: No    Comment: occasional drink.  . Drug use: No  . Sexual activity: Yes  Other Topics Concern  . Not on file  Social History Narrative   Lives in [place] with husband.  Systems analyst.  Also in school @ [employer] for [employer].  Does not routinely exercise.   Social Determinants of Health   Financial Resource Strain: Not on file  Food Insecurity: Not on file  Transportation Needs: Not on file  Physical Activity: Not on file  Stress: Not on file  Social Connections: Not on file     Family History:  The patient's family history includes Cancer in her maternal grandmother; Diabetes Mellitus II in her sister; Hypertension in her mother.  Family history is notable in that her uncle  had sleep apnea but has not utilized treatment and he gave her his machine.  ROS General: Negative; No fevers, chills, or night sweats; morbid obesity HEENT: Negative; No changes in vision or hearing, sinus congestion, difficulty swallowing Pulmonary: Negative; No cough, wheezing, shortness of breath, hemoptysis Cardiovascular: History of PAF GI: Negative; No nausea, vomiting, diarrhea, or abdominal  pain GU: Negative; No dysuria, hematuria, or difficulty voiding Musculoskeletal: Negative; no myalgias, joint pain, or weakness Hematologic/Oncology: Negative; no easy bruising, bleeding Endocrine: Positive for diabetes mellitus Neuro: Negative; no changes in balance, headaches Skin: Negative; No rashes or skin lesions Psychiatric: Negative; No behavioral problems, depression Sleep: Positive for severe sleep apnea; snoring, daytime sleepiness, hypersomnolence; now on BiPAP therapy.  No bruxism, restless legs, hypnogognic hallucinations, no cataplexy Other comprehensive 14 point system review is negative.   PHYSICAL EXAM:   VS:  BP 140/66 (BP Location: Left Arm, Patient Position: Sitting)   Pulse 90   Ht $R'5\' 7"'VB$  (1.702 m)   Wt 298 lb 12.8 oz (135.5 kg)   SpO2 91%   BMI 46.80 kg/m     Repeat blood pressure by me 126/68  Wt Readings from Last 3 Encounters:  02/17/21 297 lb 6.4 oz (134.9 kg)  02/15/21 298 lb 12.8 oz (135.5 kg)  11/24/20 (!) 301 lb 6.4 oz (136.7 kg)  General: Alert, oriented, no distress.  Morbidly obese Skin: normal turgor, no rashes, warm and dry HEENT: Normocephalic, atraumatic. Pupils equal round and reactive to light; sclera anicteric; extraocular muscles intact;  Nose without nasal septal hypertrophy Mouth/Parynx benign; Mallinpatti scale 4 Neck: No JVD, no carotid bruits; normal carotid upstroke Lungs: clear to ausculatation and percussion; no wheezing or rales Chest wall: without tenderness to palpitation Heart: PMI not displaced, RRR, s1 s2 normal, 1/6 systolic murmur, no diastolic murmur, no rubs, gallops, thrills, or heaves Abdomen: soft, nontender; no hepatosplenomehaly, BS+; abdominal aorta nontender and not dilated by palpation. Back: no CVA tenderness Pulses 2+ Musculoskeletal: full range of motion, normal strength, no joint deformities Extremities: no clubbing cyanosis or edema, Homan's sign negative  Neurologic: grossly nonfocal; Cranial nerves  grossly wnl Psychologic: Normal mood and affect   Studies/Labs Reviewed:    August 13, 2020 ECG (independently read by me): NSR at 75, no ectopy, normal intervals  Apr 24, 2020  ECG (independently read by me): NSR at 84; no ectopy; normal intervals  I personally reviewed most recent ECG from December 22, 2019 which showed sinus rhythm at 69 bpm.  There were nonspecific ST changes.  August 05, 2019 ECG (independently read by me): Normal sinus rhythm at 83 bpm.  Probable left atrial enlargement.  QTc interval 432 ms PR interval 146 ms  ECG (independently read by me): Normal sinus rhythm at 84 bpm.  Normal intervals.  No ectopy  October 2018 ECG (independently read by me): Normal sinus rhythm at 73 bpm.  No ectopy.  Normal intervals.  Recent Labs: BMP Latest Ref Rng & Units 02/17/2021 11/24/2020 09/30/2020  Glucose 65 - 99 mg/dL 112(H) 81 121(H)  BUN 6 - 24 mg/dL $Remove'13 11 8  'RstSYNr$ Creatinine 0.57 - 1.00 mg/dL 0.61 0.49(L) 0.44(L)  BUN/Creat Ratio 9 - $R'23 21 22 18  'ON$ Sodium 134 - 144 mmol/L 141 140 143  Potassium 3.5 - 5.2 mmol/L 4.4 4.6 4.5  Chloride 96 - 106 mmol/L 100 100 102  CO2 20 - 29 mmol/L 25 - 27  Calcium 8.7 - 10.2 mg/dL 9.8 9.5 9.5     Hepatic Function Latest Ref Rng & Units 11/24/2020 07/02/2020 03/24/2020  Total Protein 6.0 - 8.5 g/dL 7.5 7.9 7.4  Albumin 3.8 - 4.9 g/dL 4.7 4.8 4.6  AST 0 - 40 IU/L $Remov'18 20 19  'jvKmeU$ ALT 0 - 32 IU/L - 17 15  Alk Phosphatase 44 - 121 IU/L 69 68 62  Total Bilirubin 0.0 - 1.2 mg/dL 0.4 0.4 0.3    CBC Latest Ref Rng & Units 11/24/2020 12/21/2019 02/19/2019  WBC 3.4 - 10.8 x10E3/uL 9.3 9.9 7.7  Hemoglobin 11.1 - 15.9 g/dL 13.7 13.7 14.1  Hematocrit 34.0 - 46.6 % 42.1 45.6 42.6  Platelets 150 - 450 x10E3/uL 250 246 244   Lab Results  Component Value Date   MCV 94 11/24/2020   MCV 95.6 12/21/2019   MCV 92 02/19/2019   Lab Results  Component Value Date   TSH 12.500 (H) 11/24/2020   Lab Results  Component Value Date   HGBA1C 6.4 (A) 02/17/2021      BNP    Component Value Date/Time   BNP 205.8 (H) 12/21/2019 1427    ProBNP No results found for: PROBNP   Lipid Panel     Component Value Date/Time   CHOL 131 07/02/2020 1420   TRIG 56 07/02/2020 1420   HDL 53 07/02/2020 1420   CHOLHDL 2.5 07/02/2020 1420   CHOLHDL 3.5  01/25/2019 0850   VLDL 19 01/25/2019 0850   LDLCALC 66 07/02/2020 1420     RADIOLOGY: No results found.   Additional studies/ records that were reviewed today include:  I have reviewed the patient's prior ER evaluation for cardioversion, A. fib clinic note, and evaluation by Ignacia Bayley, NP  I have reviewed her recent hospitalization from 1016 through December 23, 2019. New download was obtained. Haley Epworth Sleepiness Scale score was recalculated today 02/15/2021 which endorsed at 6 arguing against residual daytime sleepiness.   ASSESSMENT:    1. Controlled type 2 diabetes mellitus without complication, without long-term current use of insulin ([place])   2. Essential hypertension   3. OSA (obstructive sleep apnea)   4. Morbid obesity ([place])   5. Hyperlipidemia with target LDL less than 70   6. Chronic diastolic heart failure ([place])   7. Graves disease   8. Anticoagulated     PLAN:  Ms. Amiliah Campisi is a 55 year old female who history of morbid obesity, hypertension, paroxysmal atrial fibrillation, type 2 diabetes mellitus, and was diagnosed as having very severe sleep apnea.  She failed CPAP therapy and required maximum BiPAP therapy at 25/21 for optimal treatment.  Initially, she never instituted BiPAP therapy due to lack of insurance.  At a prior evaluation she was  borrowing a friend's CPAP unit.  At that time she still had issues and was remaining sleepy felt as though she was not getting adequate air particularly in the middle of the night. She was admitted to the hospital in January 2021 with hypoxic respiratory failure felt secondary to acute diastolic heart failure.  She had not been using  her CPAP and clearly was affected by her untreated sleep apnea.  During that evaluation she was also started on methimazole hyperthyroidism with TSH at 0.010.  Fortunately, she was evaluated by care manager and through the generosity of the heart and vascular found as Haley outpatient she was able to receive a new ResMed air curve and BiPAP unit.  Her set up date was December 27, 2019.  When I saw her in February 2021 she was meeting compliance standards with reference to usage and has only had 1 day without usage.  She was set at a 18/10 pressure and a download revealed Haley AHI of 4.4 and central apnea of 1.4.  When I  evaluated her in May 2021 I discussed the importance of improved compliance.  At a 19/10 pressure AHI was 2.7 and Haley Epworth scale endorsed at 10.  When last seen, she had issues with a cracked tube and during that evaluation I provided her new tubing so that she could reinstitute BiPAP therapy.  With reinstitution, she has felt significantly improved.  Haley Epworth Sleepiness Scale score today endorsed at 6 arguing against residual daytime sleepiness.  At her pressure of 19/10, AHI is 2.7.  Sleep duration however is 5 hours and 47 minutes.  I discussed with her the importance of improved sleep duration ideally being at least 7 to 8 hours per night with utilization of BiPAP.  I again discussed the importance of weight loss and increased exercise.  She has diastolic heart failure which may be contributing to some of her exertional dyspnea.  Her blood pressure today on recheck by me was stable at 126/68 on a regimen consisting of furosemide 20 mg as needed, lisinopril 2.5 mg daily and metoprolol succinate 50 mg.  She continues to be on atorvastatin for hyperlipidemia.  Lipid studies in July 2021 showed  Haley LDL of 66.  She is on glipizide and Trulicity in addition to Metformin for her diabetes mellitus.  She continues to take them as I will for her Berenice Primas' disease.  I will see her in 6 months for  reevaluation.   [name], MD

## 2021-02-15 NOTE — Patient Instructions (Signed)
Medication Instructions:  No changes continue using C-PAP  *If you need a refill on your cardiac medications before your next appointment, please call your pharmacy*   Lab Work: Not needed    Testing/Procedures: Not needed   Follow-Up: At [facility], you and your health needs are our priority.  As part of our continuing mission to provide you with exceptional heart care, we have created designated Provider Care Teams.  These Care Teams include your primary Cardiologist (physician) and Advanced Practice Providers (APPs -  Physician Assistants and Nurse Practitioners) who all work together to provide you with the care you need, when you need it.     Your next appointment:   6 month(s)  The format for your next appointment:   In Person  Provider:   [name], MD

## 2021-02-17 ENCOUNTER — Other Ambulatory Visit: Payer: Self-pay

## 2021-02-17 ENCOUNTER — Ambulatory Visit: Payer: Self-pay | Attending: Nurse Practitioner | Admitting: Nurse Practitioner

## 2021-02-17 ENCOUNTER — Ambulatory Visit: Payer: Self-pay | Admitting: Nurse Practitioner

## 2021-02-17 ENCOUNTER — Encounter: Payer: Self-pay | Admitting: Nurse Practitioner

## 2021-02-17 VITALS — BP 128/76 | HR 77 | Temp 98.7°F | Ht 67.0 in | Wt 297.4 lb

## 2021-02-17 DIAGNOSIS — I10 Essential (primary) hypertension: Secondary | ICD-10-CM

## 2021-02-17 DIAGNOSIS — Z6841 Body Mass Index (BMI) 40.0 and over, adult: Secondary | ICD-10-CM

## 2021-02-17 DIAGNOSIS — E1165 Type 2 diabetes mellitus with hyperglycemia: Secondary | ICD-10-CM

## 2021-02-17 LAB — POCT GLYCOSYLATED HEMOGLOBIN (HGB A1C): Hemoglobin A1C: 6.4 % — AB (ref 4.0–5.6)

## 2021-02-17 LAB — GLUCOSE, POCT (MANUAL RESULT ENTRY): POC Glucose: 129 mg/dl — AB (ref 70–99)

## 2021-02-17 NOTE — Progress Notes (Signed)
Assessment & Plan:  Haley [name] was seen today for follow-up.  Diagnoses and all orders for this visit:  Essential hypertension -     Basic metabolic panel Continue all antihypertensives as prescribed.  Remember to bring in your blood pressure log with you for your follow up appointment.  DASH/Mediterranean Diets are healthier choices for HTN.    Morbid obesity with BMI of 45.0-49.9, adult ([place]) -     Basic metabolic panel  Type 2 diabetes mellitus with hyperglycemia, without long-term current use of insulin (HCC) -     Basic metabolic panel -     HgB Y5W -     Glucose (CBG) Continue blood sugar control as discussed in office today, low carbohydrate diet, and regular physical exercise as tolerated, 150 minutes per week (30 min each day, 5 days per week, or 50 min 3 days per week). Keep blood sugar logs with fasting goal of 90-130 mg/dl, post prandial (after you eat) less than 180.  For Hypoglycemia: BS <60 and Hyperglycemia BS >400; contact the clinic ASAP. Annual eye exams and foot exams are recommended.   Patient has been counseled on age-appropriate routine health concerns for screening and prevention. These are reviewed and up-to-date. Referrals have been placed accordingly. Immunizations are up-to-date or declined.    Subjective:   Chief Complaint  Patient presents with  . Follow-up   HPI Haley [name] 55 y.o. female presents to office today for follow up. She has a past medical history of Essential hypertension, Fibroids, Hyperthyroidism, Morbid obesity ([place]), Paroxysmal atrial fibrillation ([place]), [place], and Type II diabetes mellitus ([place]).She has not seen Endocrinology for her Graves Disease in 6 months. She has an appt on 02-25-2021 Currently taking methimazole to 5 mg BID and Toprol 62m in am and 50 mg pm.   Essential Hypertension Blood pressure is well controlled. She is on renal dose ACE 2.56mand takes metoprolol for PAF. Denies chest pain, shortness of breath,  palpitations, lightheadedness, dizziness, headaches or worsening BLE edema.  She takes furosemide as needed for BLE edema. Sats are borderline today 92%. Will wait to see if she plans thyroidectomy and O2 status improves. If not will need to see pulm. She endorses compliance with nightly BIPAP BP Readings from Last 3 Encounters:  02/17/21 128/76  02/15/21 140/66  11/24/20 138/78   DM2 Well controlled. Down from 7 to 6.4. Weight is also down 4 lbs. She has been trying to lose weight but not motivated to consistently exercise daily.  She is taking Trulicity 1.5 mg weekly glipizide 10 mg twice daily and Metformin 2000 mg daily as prescribed.  LDL is at goal with atorvastatin 20 mg daily. Lab Results  Component Value Date   HGBA1C 6.4 (A) 02/17/2021   Lab Results  Component Value Date   LDLCALC 66 07/02/2020   Review of Systems  Constitutional: Negative for fever, malaise/fatigue and weight loss.  HENT: Negative.  Negative for nosebleeds.   Eyes: Negative.  Negative for blurred vision, double vision and photophobia.  Respiratory: Positive for shortness of breath (intermittent). Negative for cough.   Cardiovascular: Negative.  Negative for chest pain, palpitations and leg swelling.  Gastrointestinal: Negative.  Negative for heartburn, nausea and vomiting.  Musculoskeletal: Negative.  Negative for myalgias.  Neurological: Negative.  Negative for dizziness, focal weakness, seizures and headaches.  Psychiatric/Behavioral: Negative.  Negative for suicidal ideas.    Past Medical History:  Diagnosis Date  . Essential hypertension   . Fibroids   . Hyperthyroidism  a. 2010, treated w/ tapazole.  . Morbid obesity ([place])   . Paroxysmal atrial fibrillation ([place])    a. 03/2009 - in setting of hyperthyroidism and caffeine intake, converted spontaneously;  b. 08/2016 ED visit for recurrent PAF-->successful DCCV in ED.  [name] [place]   . Type II diabetes mellitus ([place])     Past Surgical History:   Procedure Laterality Date  . UTERINE FIBROID SURGERY      Family History  Problem Relation Age of Onset  . Hypertension Mother   . Diabetes Mellitus II Sister   . Cancer Maternal Grandmother     Social History Reviewed with no changes to be made today.   Outpatient Medications Prior to Visit  Medication Sig Dispense Refill  . atorvastatin (LIPITOR) 20 MG tablet Take 1 tablet (20 mg total) by mouth daily. 90 tablet 3  . Blood Glucose Monitoring Suppl (TRUE METRIX METER) w/Device KIT Use twice daily 1 kit 0  . ELIQUIS 5 MG TABS tablet TAKE 1 TABLET (5 MG TOTAL) BY MOUTH 2 (TWO) TIMES DAILY. 180 tablet 1  . furosemide (LASIX) 20 MG tablet Take 1 tablet (20 mg total) by mouth as needed for edema. 30 tablet 2  . gabapentin (NEURONTIN) 100 MG capsule Take 1 capsule (100 mg total) by mouth at bedtime. 30 capsule 3  . glipiZIDE (GLUCOTROL) 10 MG tablet Take 1 tablet (10 mg total) by mouth 2 (two) times daily before a meal. 180 tablet 1  . glucose blood (TRUE METRIX BLOOD GLUCOSE TEST) test strip Use as instructed. Check blood glucose level by fingerstick twice per day. 200 each 12  . lisinopril (ZESTRIL) 2.5 MG tablet Take 2 tablets (5 mg total) by mouth daily. 180 tablet 1  . metFORMIN (GLUCOPHAGE-XR) 500 MG 24 hr tablet Take 4 tablets (2,000 mg total) by mouth at bedtime. 360 tablet 1  . methimazole (TAPAZOLE) 5 MG tablet Take 1 tablet (5 mg total) by mouth daily. 90 tablet 1  . Methylsulfonylmethane (MSM PO) Take 2 tablets by mouth daily.    . metoprolol succinate (TOPROL-XL) 50 MG 24 hr tablet Take 1 tablet (50 mg total) by mouth daily. 90 tablet 3  . Multiple Vitamins-Minerals (ALIVE WOMENS 50+) TABS Take 1 tablet by mouth daily.    . naproxen sodium (ALEVE) 220 MG tablet Take 220 mg by mouth daily as needed.    . TRUEplus Lancets 28G MISC Use twice daily for blood glucose check 200 each 6  . TRULICITY 1.5 QX/4.5WT SOPN INJECT 1.5 MG INTO THE SKIN ONCE A WEEK. 6 mL 0   No  facility-administered medications prior to visit.    Not on File     Objective:    BP 128/76   Pulse 77   Temp 98.7 F (37.1 C) (Oral)   Ht _0  (1.702 m)   Wt 297 lb 6.4 oz (134.9 kg)   SpO2 92%   BMI 46.58 kg/m  Wt Readings from Last 3 Encounters:  02/17/21 297 lb 6.4 oz (134.9 kg)  02/15/21 298 lb 12.8 oz (135.5 kg)  11/24/20 (!) 301 lb 6.4 oz (136.7 kg)    Physical Exam Vitals and nursing note reviewed.  Constitutional:      Appearance: She is well-developed.  HENT:     Head: Normocephalic and atraumatic.  Cardiovascular:     Rate and Rhythm: Normal rate and regular rhythm.     Heart sounds: Normal heart sounds. No murmur heard. No friction rub. No gallop.   Pulmonary:  Effort: Pulmonary effort is normal. No tachypnea or respiratory distress.     Breath sounds: Normal breath sounds. No decreased breath sounds, wheezing, rhonchi or rales.  Chest:     Chest wall: No tenderness.  Abdominal:     General: Bowel sounds are normal.     Palpations: Abdomen is soft.  Musculoskeletal:        General: Normal range of motion.     Cervical back: Normal range of motion.  Skin:    General: Skin is warm and dry.  Neurological:     Mental Status: She is alert and oriented to person, place, and time.     Coordination: Coordination normal.  Psychiatric:        Behavior: Behavior normal. Behavior is cooperative.        Thought Content: Thought content normal.        Judgment: Judgment normal.          Patient has been counseled extensively about nutrition and exercise as well as the importance of adherence with medications and regular follow-up. The patient was given clear instructions to go to ER or return to medical center if symptoms don't improve, worsen or new problems develop. The patient verbalized understanding.   Follow-up: Return in about 3 months (around 05/20/2021).   [name], FNP-BC [facility] and [place],  [place]   02/17/2021, 10:00 AM

## 2021-02-18 LAB — BASIC METABOLIC PANEL
BUN/Creatinine Ratio: 21 (ref 9–23)
BUN: 13 mg/dL (ref 6–24)
CO2: 25 mmol/L (ref 20–29)
Calcium: 9.8 mg/dL (ref 8.7–10.2)
Chloride: 100 mmol/L (ref 96–106)
Creatinine, Ser: 0.61 mg/dL (ref 0.57–1.00)
Glucose: 112 mg/dL — ABNORMAL HIGH (ref 65–99)
Potassium: 4.4 mmol/L (ref 3.5–5.2)
Sodium: 141 mmol/L (ref 134–144)
eGFR: 106 mL/min/{1.73_m2} (ref >59)

## 2021-02-21 ENCOUNTER — Encounter: Payer: Self-pay | Admitting: Cardiovascular Disease

## 2021-02-22 MED FILL — glipiZIDE 10 MG TABS: 10 | 30 days supply | Qty: 60 | Fill #3

## 2021-02-22 MED FILL — LISINOPRIL 2.5 MG TABLET: 2.5 | 30 days supply | Qty: 60 | Fill #5

## 2021-02-25 ENCOUNTER — Encounter: Payer: Self-pay | Admitting: Internal Medicine

## 2021-02-25 ENCOUNTER — Ambulatory Visit: Payer: Self-pay | Admitting: Internal Medicine

## 2021-02-25 ENCOUNTER — Other Ambulatory Visit: Payer: Self-pay

## 2021-02-25 VITALS — BP 128/82 | HR 82 | Ht 67.0 in | Wt 297.4 lb

## 2021-02-25 DIAGNOSIS — E049 Nontoxic goiter, unspecified: Secondary | ICD-10-CM

## 2021-02-25 DIAGNOSIS — E040 Nontoxic diffuse goiter: Secondary | ICD-10-CM

## 2021-02-25 DIAGNOSIS — E0500 Thyrotoxicosis with diffuse goiter without thyrotoxic crisis or storm: Secondary | ICD-10-CM

## 2021-02-25 NOTE — Progress Notes (Addendum)
Patient ID: [name], female   DOB: July 01, 1966, 55 y.o.   MRN: [id number]   This visit occurred during the SARS-CoV-2 public health emergency.  Safety protocols were in place, including screening questions prior to the visit, additional usage of staff PPE, and extensive cleaning of exam room while observing appropriate contact time as indicated for disinfecting solutions.   HPI  Haley [name] is a 55 y.o.-year-old female, initially referred by her PCP, Haley Pounds, NP, returning for follow-up for thyrotoxicosis, diagnosed as Graves' disease at last visit and also large goiter.  Last visit 6 months ago.  Interim history: She continues to have shortness of breath and actually she also has oxygen desaturation now.  She is not on oxygen during the day but is on BiPAP at night.  She started this since last visit. She still complains of significant pain in her knees and difficulty walking.  Reviewed and addended history: Patient has a history of thyrotoxicosis since 2010. At that time she was hospitalized with pericarditis and Acute heart failure.   After diagnosis, she was started on a medication (? MMI) >> she could not tolerate 2/2 extreme fatigue >> changed to another medication (? PTU).  Tests normalized afterwards and she was taken off the medication.    However, she again started to have symptoms: Weakness, shortness of breath.  The TSH was rechecked and this was low while her free T4 was high.   She was started on methimazole and Toprol.  When I last saw her, her TSH returned elevated and a free T4 returned low.  We decreased her methimazole and Toprol.  Due to insomnia,  we increased her metoprolol to the previous dose, but gave her the higher dose at night.  She is treated with: - Methimazole 5 mg 3 times a day  >> 5 mg twice a day >> 5 mg once a day - Toprol-XL 50 mg daily + 25 mg at night >> 50 mg daily >> 25 mg in a.m. and 50 mg at night  She feels good on the  above regimen.  I reviewed her TFTs: Lab Results  Component Value Date   TSH 12.500 (H) 11/24/2020   TSH 0.007 (L) 08/28/2020   TSH 0.73 04/23/2020   TSH 4.51 (H) 01/24/2020   TSH 0.010 (L) 12/21/2019   TSH 1.624 01/24/2019   Lab Results  Component Value Date   FREET4 1.63 08/28/2020   FREET4 0.75 04/23/2020   FREET4 0.45 (L) 01/24/2020   FREET4 1.33 (H) 12/22/2019   No components found for: FREET3   Her TSI antibodies were elevated: Lab Results  Component Value Date   TSI 658 (H) 01/24/2020    She also has a large goiter with substernal extension.  Thyroid ultrasound (05/08/2019): Enlarged thyroid, no nodules. Inferior margin of the left thyroid lobe is difficult to visualize due to the substernal extension.  CXR (12/21/2019): Per review of the images, trachea is slightly deviated to the right  CT chest (12/22/2019): Stable, large goiter, extending into the substernal space  At the time of her diagnosis, she complained of: - weight gain, however, she does have weight loss per review of the chart, approximately 7 [name] net in the last month - Heat intolerance-chronic - Poor sleep - Shortness of breath - Leg swelling - Hair loss  She continues to have shortness of breath, possibly also related to deconditioning and also heat intolerance (she is postmenopausal), but her sleep and hair loss improved.  She continues to feel fatigued, to have hot flashes, palpitations.  She sees cardiology (Dr. Claiborne Billings).   Pt denies: - feeling nodules in neck - hoarseness - dysphagia - choking But she does have shortness of breath.  Pt does have a FH of thyroid ds.: M aunt and M cousin. No FH of thyroid cancer. No h/o radiation tx to head or neck.  No herbal supplements. No Biotin use. No recent steroids use.   Patient also has CHF, and also severe OSA-started on CPAP in 12/2019 -felt much better afterwards. She had to stop as her hose broke >> she just restarted this. Now on  BiPAP.  Last year, she started exercising: Stationary bike, walking, chair aerobics, weightlifting.  She has DM2 >> last HbA1c better: 6.4%.  ROS: Constitutional: no weight gain/no weight loss, no fatigue, + subjective hyperthermia, no subjective hypothermia Eyes: no blurry vision, no xerophthalmia ENT: no sore throat, no nodules palpated in neck, no dysphagia, no odynophagia, no hoarseness Cardiovascular: no CP/+ SOB/+ palpitations/no leg swelling Respiratory: no cough/+ SOB/no wheezing Gastrointestinal: no N/no V/no D/no C/no acid reflux Musculoskeletal: no muscle aches/+ joint aches Skin: no rashes, no hair loss Neurological: no tremors/no numbness/no tingling/no dizziness  I reviewed pt's medications, allergies, PMH, social hx, family hx, and changes were documented in the history of present illness. Otherwise, unchanged from my initial visit note.  Past Medical History:  Diagnosis Date  . Essential hypertension   . Fibroids   . Hyperthyroidism    a. 2010, treated w/ tapazole.  . Morbid obesity ([place])   . Paroxysmal atrial fibrillation ([place])    a. 03/2009 - in setting of hyperthyroidism and caffeine intake, converted spontaneously;  b. 08/2016 ED visit for recurrent PAF-->successful DCCV in ED.  [name] Snores   . Type II diabetes mellitus ([place])    Past Surgical History:  Procedure Laterality Date  . UTERINE FIBROID SURGERY     Social History   Socioeconomic History  . Marital status: Married    Spouse name: Not on file  . Number of children: 0  . Years of education: Not on file  . Highest education level: Not on file  Occupational History  . Occupation:  Charity fundraiser, Haematologist  Tobacco Use  . Smoking status: Former Smoker, quit in 2018    Packs/day: 1.00    Years: 32.00    Pack years: 32.00    Quit date: 2012    Years since quitting: 9.1  . Smokeless tobacco: Never Used  . Tobacco comment: patient vapes  Substance and Sexual Activity  . Alcohol use: No     Comment: Wine, 2-3 glasses, every 3 weeks  . Drug use: No  Social History Narrative   Lives in [place] with husband.  Systems analyst.  Also in school @ [employer] for [employer].     Social Determinants of Health   Financial Resource Strain:   . Difficulty of Paying Living Expenses: Not on file  Food Insecurity:   . Worried About Charity fundraiser in the Last Year: Not on file  . Ran Out of Food in the Last Year: Not on file  Transportation Needs:   . Lack of Transportation (Medical): Not on file  . Lack of Transportation (Non-Medical): Not on file  Physical Activity:   . Days of Exercise per Week: Not on file  . Minutes of Exercise per Session: Not on file  Stress:   . Feeling of Stress : Not on file  Social Connections:   .  Frequency of Communication with Friends and Family: Not on file  . Frequency of Social Gatherings with Friends and Family: Not on file  . Attends Religious Services: Not on file  . Active Member of Clubs or Organizations: Not on file  . Attends Archivist Meetings: Not on file  . Marital Status: Not on file  Intimate Partner Violence:   . Fear of Current or Ex-Partner: Not on file  . Emotionally Abused: Not on file  . Physically Abused: Not on file  . Sexually Abused: Not on file   Current Outpatient Medications on File Prior to Visit  Medication Sig Dispense Refill  . atorvastatin (LIPITOR) 20 MG tablet Take 1 tablet (20 mg total) by mouth daily. 90 tablet 3  . Blood Glucose Monitoring Suppl (TRUE METRIX METER) w/Device KIT Use twice daily 1 kit 0  . ELIQUIS 5 MG TABS tablet TAKE 1 TABLET (5 MG TOTAL) BY MOUTH 2 (TWO) TIMES DAILY. 180 tablet 1  . furosemide (LASIX) 20 MG tablet Take 1 tablet (20 mg total) by mouth as needed for edema. 30 tablet 2  . glucose blood (TRUE METRIX BLOOD GLUCOSE TEST) test strip Use as instructed. Check blood glucose level by fingerstick twice per day. 200 each 12  . methimazole (TAPAZOLE) 5 MG tablet Take 1  tablet (5 mg total) by mouth daily. 90 tablet 1  . Methylsulfonylmethane (MSM PO) Take 2 tablets by mouth daily.    . metoprolol succinate (TOPROL-XL) 50 MG 24 hr tablet Take 1 tablet (50 mg total) by mouth daily. 90 tablet 3  . Multiple Vitamins-Minerals (ALIVE WOMENS 50+) TABS Take 1 tablet by mouth daily.    . naproxen sodium (ALEVE) 220 MG tablet Take 220 mg by mouth daily as needed.    . TRUEplus Lancets 28G MISC Use twice daily for blood glucose check 200 each 6  . TRULICITY 1.5 HC/6.2BJ SOPN INJECT 1.5 MG INTO THE SKIN ONCE A WEEK. 6 mL 0  . gabapentin (NEURONTIN) 100 MG capsule Take 1 capsule (100 mg total) by mouth at bedtime. 30 capsule 3  . glipiZIDE (GLUCOTROL) 10 MG tablet Take 1 tablet (10 mg total) by mouth 2 (two) times daily before a meal. 180 tablet 1  . lisinopril (ZESTRIL) 2.5 MG tablet Take 2 tablets (5 mg total) by mouth daily. 180 tablet 1  . metFORMIN (GLUCOPHAGE-XR) 500 MG 24 hr tablet Take 4 tablets (2,000 mg total) by mouth at bedtime. 360 tablet 1   No current facility-administered medications on file prior to visit.   No Known Allergies Family History  Problem Relation Age of Onset  . Hypertension Mother   . Diabetes Mellitus II Sister   . Cancer Maternal Grandmother     PE: BP 128/82 (BP Location: Right Arm, Patient Position: Sitting, Cuff Size: Large)   Pulse 82   Ht $R'5\' 7"'xu$  (1.702 m)   Wt 297 lb 6.4 oz (134.9 kg)   SpO2 (!) 87%   BMI 46.58 kg/m  Wt Readings from Last 3 Encounters:  02/25/21 297 lb 6.4 oz (134.9 kg)  02/17/21 297 lb 6.4 oz (134.9 kg)  02/15/21 298 lb 12.8 oz (135.5 kg)   Constitutional: overweight, in NAD Eyes: PERRLA, EOMI, no exophthalmos, no lid lag, no stare ENT: moist mucous membranes, no thyromegaly, no cervical lymphadenopathy Cardiovascular: RRR, No MRG, no LE edema Respiratory: CTA B Gastrointestinal: abdomen soft, NT, ND, BS+ Musculoskeletal: no deformities, strength intact in all 4 Skin: moist, warm, no  rashes Neurological:  no tremor with outstretched hands, DTR normal in all 4  ASSESSMENT: 1. Thyrotoxicosis  2.  Goiter  PLAN:  1. Patient with thyrotoxicosis with thyrotoxic symptoms: Weight loss, heat intolerance (which is chronic, dull), shortness of breath, weakness.  Her TSI's were elevated pointing towards a diagnosis of Graves' disease. -He was started on methimazole 5 mg 3 times a day and her sugars improved afterwards.  She responded well to methimazole and we were able to decrease the dose gradually.  She was on methimazole 5 mg twice a day in the past but at last visit she missed doses.  Discussed about the importance of taking the medication consistently.  Afterwards, her TSH increased her methimazole dose was decreased only once a day. -We discussed about possible modalities of treatment for Graves' disease including RAI treatment and surgery.  Normally, surgery is last resort, however, due to the presence of goiter, at this visit, I again suggested this.  She agreed. -For now, we will check her TFTs and adjust her methimazole dose accordingly. -Of note, she denies signs of Graves' ophthalmopathy: No double vision, blurry vision, eye pain, chemosis. -She continues on 25 mg of Toprol-XL in the morning and 50 mg at night.  She had problems sleeping when she tried to reduce the dose up at night.  Her pulse is normal at this visit 6 months -I will see her back in 6 months  2.  Goiter -This is large and extending in the mediastinum - on the chest x-ray from 12/20/2019, her trachea was slightly deviated to the right. -She does not have significant neck compression symptoms, interestingly.  In the past she was complaining of saliva catching in her throat occasionally and some cough, but not bothersome.  Her symptoms improved after starting the CPAP, but at last visit she was off this after her hose broke.  She was then able to restart it but more recently switched to a BiPAP. -She continues  to have low oxygen saturation.  At last visit, this was 90%, but at today's visit, it is 87%.  She continues to feel short of breath and tired. -It is difficult to know whether the symptoms are related to thyroid or tumor sleep apnea/deconditioning.  However, I feel that her breathing may improve after thyroidectomy.  Also, this would correct her thyrotoxicosis.  We did discuss that she would need to be on levothyroxine for life afterwards.  Explained how to take this correctly. -At this visit, she agrees with the surgery.  I placed a referral for her to see Dr. Harlow Asa.  Component     Latest Ref Rng & Units 03/05/2021  TSH     0.35 - 4.50 uIU/mL 6.08 (H)  T4,Free(Direct)     0.60 - 1.60 ng/dL 0.61  Triiodothyronine,Free,Serum     2.3 - 4.2 pg/mL 3.1  TSH is slightly high, but improved.  We need to reduce the methimazole dose to 2.5 mg daily and check her TFTs again in 1.5 months.  Philemon Kingdom, MD PhD [facility] Endocrinology

## 2021-02-25 NOTE — Patient Instructions (Signed)
Please stop at the lab.  Please continue Methimazole 5 mg daily.  Please come back for a follow-up appointment in 4 months.

## 2021-03-02 ENCOUNTER — Telehealth: Payer: Self-pay | Admitting: Nurse Practitioner

## 2021-03-02 NOTE — Telephone Encounter (Signed)
Copied from [place] [id number]. Topic: General - Other >> Feb 26, 2021  3:32 PM Yvette Rack wrote: Reason for CRM: Pt requests to speak with Zelda Fleming's nurse about some blood work. Pt requests call back. >> Mar 01, 2021  4:50 PM Celene Kras wrote: Pt called and is requesting to have a call back from the nurse. Please advise.

## 2021-03-03 NOTE — Telephone Encounter (Signed)
Returned pt call lvm

## 2021-03-03 NOTE — Telephone Encounter (Signed)
Pt returned call made pt aware of lab results from when she seen Zelda. Pt asked about labs from the Endocrinologist made pt aware that we are not allowed to draw labs for other providers. Pt states she understands and doesn't have any questions or concerns

## 2021-03-05 ENCOUNTER — Other Ambulatory Visit (INDEPENDENT_AMBULATORY_CARE_PROVIDER_SITE_OTHER): Payer: Self-pay

## 2021-03-05 ENCOUNTER — Telehealth: Payer: Self-pay | Admitting: Pharmacy Technician

## 2021-03-05 ENCOUNTER — Other Ambulatory Visit: Payer: Self-pay | Admitting: Nurse Practitioner

## 2021-03-05 DIAGNOSIS — E1169 Type 2 diabetes mellitus with other specified complication: Secondary | ICD-10-CM

## 2021-03-05 DIAGNOSIS — E0500 Thyrotoxicosis with diffuse goiter without thyrotoxic crisis or storm: Secondary | ICD-10-CM

## 2021-03-05 LAB — T4, FREE: Free T4: 0.61 ng/dL (ref 0.60–1.60)

## 2021-03-05 LAB — T3, FREE: T3, Free: 3.1 pg/mL (ref 2.3–4.2)

## 2021-03-05 LAB — TSH: TSH: 6.08 u[IU]/mL — ABNORMAL HIGH (ref 0.35–4.50)

## 2021-03-05 MED ORDER — METFORMIN HCL ER 500 MG PO TB24: 2000.0000 mg | ORAL_TABLET | Freq: Every day | ORAL | 1 refills | Status: DC

## 2021-03-05 MED FILL — metFORMIN HCL ER 500 MG TB2: 500 | 30 days supply | Qty: 120 | Fill #0

## 2021-03-05 NOTE — Telephone Encounter (Signed)
Rx has been refilled.  

## 2021-03-05 NOTE — Telephone Encounter (Signed)
Pt is having concerns about being taken off the metFORMIN (GLUCOPHAGE-XR) 500 MG 24 hr tablet and would like a refill. Please follow up to advise her with the next steps.

## 2021-03-06 ENCOUNTER — Other Ambulatory Visit: Payer: Self-pay

## 2021-03-10 MED ORDER — METHIMAZOLE 5 MG PO TABS: 2.5000 mg | ORAL_TABLET | Freq: Every day | ORAL | 1 refills | Status: DC

## 2021-03-10 NOTE — Addendum Note (Signed)
Addended by: Philemon Kingdom on: 03/10/2021 08:36 AM   Modules accepted: Orders

## 2021-03-12 ENCOUNTER — Other Ambulatory Visit: Payer: Self-pay

## 2021-03-12 MED FILL — Atorvastatin Calcium Tab 20 MG (Base Equivalent): ORAL | 30 days supply | Qty: 30 | Fill #0 | Status: AC

## 2021-03-12 MED FILL — Metoprolol Succinate Tab ER 24HR 50 MG (Tartrate Equiv): ORAL | 30 days supply | Qty: 30 | Fill #0 | Status: AC

## 2021-03-15 ENCOUNTER — Telehealth: Payer: Self-pay | Admitting: Internal Medicine

## 2021-03-15 NOTE — Telephone Encounter (Signed)
MEDICATION:  methimazole (TAPAZOLE) 5 MG tablet  PHARMACY:  [facility]  HAS THE PATIENT CONTACTED THEIR PHARMACY? yes   IS THIS A 90 DAY SUPPLY : Yes  IS PATIENT OUT OF MEDICATION: Almost  IF NOT; HOW MUCH IS LEFT:  1 day left  LAST APPOINTMENT DATE: @3 /24/2022  NEXT APPOINTMENT DATE:@8 /15/2022  DO WE HAVE YOUR PERMISSION TO LEAVE A DETAILED MESSAGE?: Yes  OTHER COMMENTS:  RX for above was sent (No Print) to [facility]. Patient requests [place].   **Let patient know to contact pharmacy at the end of the day to make sure medication is ready. **  ** Please notify patient to allow 48-72 hours to process**  **Encourage patient to contact the pharmacy for refills or they can request refills through [facility]**

## 2021-03-17 ENCOUNTER — Other Ambulatory Visit: Payer: Self-pay

## 2021-03-17 DIAGNOSIS — E0500 Thyrotoxicosis with diffuse goiter without thyrotoxic crisis or storm: Secondary | ICD-10-CM

## 2021-03-17 MED ORDER — METHIMAZOLE 5 MG PO TABS
2.5000 mg | ORAL_TABLET | Freq: Every day | ORAL | 1 refills | Status: DC
  Filled 2021-04-07: qty 15, 30d supply, fill #0
  Filled 2021-05-17: qty 15, 30d supply, fill #1
  Filled 2021-06-17: qty 15, 30d supply, fill #2

## 2021-03-17 NOTE — Telephone Encounter (Signed)
Rx sent 

## 2021-03-18 ENCOUNTER — Other Ambulatory Visit: Payer: Self-pay

## 2021-03-24 ENCOUNTER — Other Ambulatory Visit: Payer: Self-pay

## 2021-04-02 ENCOUNTER — Other Ambulatory Visit: Payer: Self-pay | Admitting: Nurse Practitioner

## 2021-04-02 DIAGNOSIS — E1169 Type 2 diabetes mellitus with other specified complication: Secondary | ICD-10-CM

## 2021-04-02 MED ORDER — METFORMIN HCL ER 500 MG PO TB24
ORAL_TABLET | ORAL | 1 refills | Status: DC
  Filled 2021-04-02: qty 120, 30d supply, fill #0

## 2021-04-02 MED ORDER — GLIPIZIDE 10 MG PO TABS
ORAL_TABLET | Freq: Two times a day (BID) | ORAL | 1 refills | Status: DC
  Filled 2021-04-02: qty 60, 30d supply, fill #0

## 2021-04-02 NOTE — Telephone Encounter (Signed)
Future visit in 1 month  

## 2021-04-02 NOTE — Telephone Encounter (Signed)
Medication: Rx #: [id number] glipiZIDE (GLUCOTROL) 10 MG tablet [[id number]] , Rx #: [id number] metFORMIN (GLUCOPHAGE-XR) 500 MG 24 hr tablet [[id number]] ,    Has the patient contacted their pharmacy?YES    (Agent: If no, request that the patient contact the pharmacy for the refill.) (Agent: If yes, when and what did the pharmacy advise?)  Preferred Pharmacy (with phone number or street name): [address] [place] [address] Phone: [phone] Fax: [phone] Hours: M-F 8:30a-5:30p    Agent: Please be advised that RX refills may take up to 3 business days. We ask that you follow-up with your pharmacy.

## 2021-04-05 ENCOUNTER — Other Ambulatory Visit: Payer: Self-pay

## 2021-04-07 ENCOUNTER — Telehealth: Payer: Self-pay | Admitting: Nurse Practitioner

## 2021-04-07 ENCOUNTER — Other Ambulatory Visit: Payer: Self-pay | Admitting: Nurse Practitioner

## 2021-04-07 ENCOUNTER — Other Ambulatory Visit: Payer: Self-pay

## 2021-04-07 DIAGNOSIS — E1169 Type 2 diabetes mellitus with other specified complication: Secondary | ICD-10-CM

## 2021-04-07 MED ORDER — TRULICITY 1.5 MG/0.5ML ~~LOC~~ SOAJ
1.5000 mg | SUBCUTANEOUS | 0 refills | Status: DC
  Filled 2021-04-07: qty 6, 84d supply, fill #0

## 2021-04-07 NOTE — Telephone Encounter (Signed)
Called pt 2xs to schedule sooner option. No answer. LVM. Called additional contact (spouse), no answer, LVM. Also Left [email or web address]  website for pt to see [facility] Calendar (location and hours of service available) for further info.

## 2021-04-07 NOTE — Telephone Encounter (Signed)
Pt was [place] endocrinologist that she could catch a respiratory infection due to the way her larynix is positioned with her thyroid and she should see her PCP as soon as possible. Pt needs to be seen sooner than scheduled appt in June / please advise

## 2021-04-22 ENCOUNTER — Telehealth: Payer: Self-pay

## 2021-04-22 NOTE — Telephone Encounter (Signed)
Copied from [place] [phone]. Topic: Referral - Question >> Apr 22, 2021  1:30 PM Lennox Solders wrote: Reason for CRM: Pt would like a referral to go to [facility] and would like zelda to return her call. Pt did not want to tell me the reason  Please advise

## 2021-04-22 NOTE — Telephone Encounter (Signed)
She needs to get a medical referral from from the [facility] and drop it off at the office if she is requesting a referral due to a medical condition.

## 2021-04-23 ENCOUNTER — Other Ambulatory Visit: Payer: Self-pay | Admitting: Nurse Practitioner

## 2021-04-23 ENCOUNTER — Other Ambulatory Visit: Payer: Self-pay

## 2021-04-23 DIAGNOSIS — E1169 Type 2 diabetes mellitus with other specified complication: Secondary | ICD-10-CM

## 2021-04-23 MED ORDER — LISINOPRIL 2.5 MG PO TABS
ORAL_TABLET | ORAL | 1 refills | Status: DC
  Filled 2021-04-23: qty 60, 30d supply, fill #0

## 2021-04-23 MED FILL — Gabapentin Cap 100 MG: ORAL | 30 days supply | Qty: 30 | Fill #0 | Status: AC

## 2021-04-23 MED FILL — Apixaban Tab 5 MG: ORAL | 30 days supply | Qty: 60 | Fill #0 | Status: AC

## 2021-04-23 MED FILL — Metoprolol Succinate Tab ER 24HR 50 MG (Tartrate Equiv): ORAL | 30 days supply | Qty: 30 | Fill #1 | Status: AC

## 2021-05-13 ENCOUNTER — Other Ambulatory Visit: Payer: Self-pay | Admitting: Nurse Practitioner

## 2021-05-13 DIAGNOSIS — E1169 Type 2 diabetes mellitus with other specified complication: Secondary | ICD-10-CM

## 2021-05-13 DIAGNOSIS — E7800 Pure hypercholesterolemia, unspecified: Secondary | ICD-10-CM

## 2021-05-13 DIAGNOSIS — I10 Essential (primary) hypertension: Secondary | ICD-10-CM

## 2021-05-13 NOTE — Telephone Encounter (Signed)
Pt called to refill her Atorvastatin 20 mg ,  Lasix 20 mg, Glipizide 10 mg and Metoprolol 50 mg and th MSM. [place]  CB#  [phone]

## 2021-05-14 ENCOUNTER — Other Ambulatory Visit: Payer: Self-pay

## 2021-05-14 MED ORDER — METFORMIN HCL ER 500 MG PO TB24
ORAL_TABLET | ORAL | 0 refills | Status: DC
  Filled 2021-05-14: qty 120, 30d supply, fill #0

## 2021-05-14 MED ORDER — ATORVASTATIN CALCIUM 20 MG PO TABS
ORAL_TABLET | Freq: Every day | ORAL | 0 refills | Status: DC
Start: 2021-05-14 — End: 2021-05-26
  Filled 2021-05-14: qty 30, 30d supply, fill #0

## 2021-05-14 MED ORDER — METOPROLOL SUCCINATE ER 50 MG PO TB24
ORAL_TABLET | Freq: Every day | ORAL | 2 refills | Status: DC
  Filled 2021-05-14: qty 30, 30d supply, fill #0
  Filled 2021-06-17: qty 30, 30d supply, fill #1
  Filled 2021-07-16: qty 30, 30d supply, fill #2

## 2021-05-14 MED ORDER — GLIPIZIDE 10 MG PO TABS
ORAL_TABLET | Freq: Two times a day (BID) | ORAL | 0 refills | Status: DC
  Filled 2021-05-14: qty 60, 30d supply, fill #0

## 2021-05-14 MED ORDER — FUROSEMIDE 20 MG PO TABS
20.0000 mg | ORAL_TABLET | ORAL | 2 refills | Status: DC | PRN
  Filled 2021-05-14: qty 30, 30d supply, fill #0
  Filled 2021-07-16: qty 30, 30d supply, fill #1

## 2021-05-14 NOTE — Telephone Encounter (Signed)
Notes to clinic: scripts filled by different providers review for refill    Requested Prescriptions  Pending Prescriptions Disp Refills   furosemide (LASIX) 20 MG tablet 30 tablet 2    Sig: Take 1 tablet (20 mg total) by mouth as needed for edema.      Cardiovascular:  Diuretics - Loop Passed - 05/14/2021  7:22 AM      Passed - K in normal range and within 360 days    Potassium  Date Value Ref Range Status  02/17/2021 4.4 3.5 - 5.2 mmol/L Final          Passed - Ca in normal range and within 360 days    Calcium  Date Value Ref Range Status  02/17/2021 9.8 8.7 - 10.2 mg/dL Final   Calcium, Ion  Date Value Ref Range Status  03/14/2009 1.16 1.12 - 1.32 mmol/L Final          Passed - Na in normal range and within 360 days    Sodium  Date Value Ref Range Status  02/17/2021 141 134 - 144 mmol/L Final          Passed - Cr in normal range and within 360 days    Creatinine, Ser  Date Value Ref Range Status  02/17/2021 0.61 0.57 - 1.00 mg/dL Final          Passed - Last BP in normal range    BP Readings from Last 1 Encounters:  02/25/21 128/82          Passed - Valid encounter within last 6 months    Recent Outpatient Visits           2 months ago Essential hypertension   [place], [place] W, NP   7 months ago Need for immunization against influenza   [place], Annie Main L, RPH-CPP   7 months ago Type 2 diabetes mellitus with other specified complication, without long-term current use of insulin [facility])   [place], [place] W, NP   10 months ago Type 2 diabetes mellitus with other specified complication, without long-term current use of insulin [facility])   [place], [place] W, NP   1 year ago Type 2 diabetes mellitus with other specified complication, without long-term current use of insulin [facility])   [place], Vernia Buff, NP       Future Appointments             In 1 week [name], NP [place]   In 3 months Troy Sine, MD [facility], [facility]               metoprolol succinate (TOPROL-XL) 50 MG 24 hr tablet 90 tablet 3    Sig: TAKE 1 TABLET (50 MG TOTAL) BY MOUTH DAILY.      Cardiovascular:  Beta Blockers Passed - 05/14/2021  7:22 AM      Passed - Last BP in normal range    BP Readings from Last 1 Encounters:  02/25/21 128/82          Passed - Last Heart Rate in normal range    Pulse Readings from Last 1 Encounters:  02/25/21 82          Passed - Valid encounter within last 6 months    Recent Outpatient Visits  2 months ago Essential hypertension   [place] [facility] [place], [place] W, NP   7 months ago Need for immunization against influenza   [facility] [place], Cornelius Moras, RPH-CPP   7 months ago Type 2 diabetes mellitus with other specified complication, without long-term current use of insulin [facility])   [place] [facility] [place], [place] W, NP   10 months ago Type 2 diabetes mellitus with other specified complication, without long-term current use of insulin [facility])   [place] [facility] [place], [place] W, NP   1 year ago Type 2 diabetes mellitus with other specified complication, without long-term current use of insulin [facility])   [place] [facility] Claiborne Rigg, NP       Future Appointments             In 1 week Claiborne Rigg, NP [facility]   In 3 months Lennette Bihari, MD [facility], [facility]              Signed Prescriptions Disp Refills   atorvastatin (LIPITOR) 20 MG tablet 90 tablet 0    Sig: TAKE 1 TABLET (20 MG TOTAL) BY MOUTH DAILY.      Cardiovascular:  Antilipid - Statins Passed  - 05/14/2021  7:22 AM      Passed - Total Cholesterol in normal range and within 360 days    Cholesterol, Total  Date Value Ref Range Status  07/02/2020 131 100 - 199 mg/dL Final          Passed - LDL in normal range and within 360 days    LDL Chol Calc (NIH)  Date Value Ref Range Status  07/02/2020 66 0 - 99 mg/dL Final          Passed - HDL in normal range and within 360 days    HDL  Date Value Ref Range Status  07/02/2020 53 >39 mg/dL Final          Passed - Triglycerides in normal range and within 360 days    Triglycerides  Date Value Ref Range Status  07/02/2020 56 0 - 149 mg/dL Final          Passed - Patient is not pregnant      Passed - Valid encounter within last 12 months    Recent Outpatient Visits           2 months ago Essential hypertension   [place] [facility] [place], [place] W, NP   7 months ago Need for immunization against influenza   [facility] [place], Jeannett Senior L, RPH-CPP   7 months ago Type 2 diabetes mellitus with other specified complication, without long-term current use of insulin [facility])   [place] [facility] [place], [place] W, NP   10 months ago Type 2 diabetes mellitus with other specified complication, without long-term current use of insulin [facility])   [place] [facility] [place], [place] W, NP   1 year ago Type 2 diabetes mellitus with other specified complication, without long-term current use of insulin [facility])   [place] [facility] [place], Shea Stakes, NP       Future Appointments             In 1 week Claiborne Rigg, NP [employer] [facility]  In 3 months Troy Sine, MD [facility], [facility]               glipiZIDE (GLUCOTROL) 10 MG tablet 180 tablet 0    Sig: TAKE 1 TABLET (10 MG TOTAL) BY MOUTH 2 (TWO) TIMES DAILY BEFORE A MEAL.      Endocrinology:  Diabetes - Sulfonylureas  Passed - 05/14/2021  7:22 AM      Passed - HBA1C is between 0 and 7.9 and within 180 days    Hemoglobin A1C  Date Value Ref Range Status  02/17/2021 6.4 (A) 4.0 - 5.6 % Final   Hgb A1c MFr Bld  Date Value Ref Range Status  07/02/2020 7.1 (H) 4.8 - 5.6 % Final    Comment:             Prediabetes: 5.7 - 6.4          Diabetes: >6.4          Glycemic control for adults with diabetes: <7.0           Passed - Valid encounter within last 6 months    Recent Outpatient Visits           2 months ago Essential hypertension   [place], [place] W, NP   7 months ago Need for immunization against influenza   [place], Annie Main L, RPH-CPP   7 months ago Type 2 diabetes mellitus with other specified complication, without long-term current use of insulin [facility])   [place], [place] W, NP   10 months ago Type 2 diabetes mellitus with other specified complication, without long-term current use of insulin [facility])   [place], [place] W, NP   1 year ago Type 2 diabetes mellitus with other specified complication, without long-term current use of insulin [facility])   [place], Vernia Buff, NP       Future Appointments             In 1 week [name], NP [place]   In 3 months Troy Sine, MD [facility], [facility]               metFORMIN (GLUCOPHAGE-XR) 500 MG 24 hr tablet 360 tablet 0    Sig: TAKE 4 TABLETS (2,000 MG TOTAL) BY MOUTH AT BEDTIME.      Endocrinology:  Diabetes - Biguanides Passed - 05/14/2021  7:22 AM      Passed - Cr in normal range and within 360 days    Creatinine, Ser  Date Value Ref Range Status  02/17/2021 0.61 0.57 - 1.00 mg/dL Final          Passed - HBA1C is between 0 and 7.9 and within 180 days    Hemoglobin A1C  Date  Value Ref Range Status  02/17/2021 6.4 (A) 4.0 - 5.6 % Final   Hgb A1c MFr Bld  Date Value Ref Range Status  07/02/2020 7.1 (H) 4.8 - 5.6 % Final    Comment:             Prediabetes: 5.7 - 6.4          Diabetes: >6.4          Glycemic control for adults with diabetes: <7.0           Passed -  AA eGFR in normal range and within 360 days    GFR calc Af Amer  Date Value Ref Range Status  11/24/2020 128 >59 mL/min/1.73 Final    Comment:    **In accordance with recommendations from the NKF-ASN Task force,**   [facility] is in the process of updating its eGFR calculation to the   2021 CKD-EPI creatinine equation that estimates kidney function   without a race variable.    GFR calc non Af Amer  Date Value Ref Range Status  11/24/2020 111 >59 mL/min/1.73 Final   eGFR  Date Value Ref Range Status  02/17/2021 106 >59 mL/min/1.73 Final          Passed - Valid encounter within last 6 months    Recent Outpatient Visits           2 months ago Essential hypertension   [place], [place] W, NP   7 months ago Need for immunization against influenza   [place], Jarome Matin, RPH-CPP   7 months ago Type 2 diabetes mellitus with other specified complication, without long-term current use of insulin [facility])   [place], [place] W, NP   10 months ago Type 2 diabetes mellitus with other specified complication, without long-term current use of insulin [facility])   [place], [place] W, NP   1 year ago Type 2 diabetes mellitus with other specified complication, without long-term current use of insulin [facility])   [place], Vernia Buff, NP       Future Appointments             In 1 week [name], NP [place]   In 3 months Troy Sine, MD [facility] [place],  [facility]

## 2021-05-17 ENCOUNTER — Other Ambulatory Visit: Payer: Self-pay

## 2021-05-21 ENCOUNTER — Ambulatory Visit: Payer: Self-pay | Admitting: Nurse Practitioner

## 2021-05-24 ENCOUNTER — Ambulatory Visit: Payer: Self-pay | Admitting: *Deleted

## 2021-05-24 NOTE — Telephone Encounter (Signed)
C/o chest pain x 2 weeks not now. Off and on not everyday. Past weekend noted more often with shortness of breath and pain in middle of chest upper portion. Chest pain does not last greater than 5 minutes. Denies difficulty breathing and chest pain now. Patient concerned A- fib related and would like to see PCP. Appt scheduled for 05/26/21. Earliest available . Care advise given. Patient verbalized understanding of care advise and to call back or to ED if symptoms worsen.

## 2021-05-24 NOTE — Telephone Encounter (Signed)
She needs a follow up with her Cardiologist as well.

## 2021-05-24 NOTE — Telephone Encounter (Signed)
Reason for Disposition  [1] Chest pain lasts < 5 minutes AND [2] NO chest pain or cardiac symptoms (e.g., breathing difficulty, sweating) now (Exception: chest pains that last only a few seconds)  Answer Assessment - Initial Assessment Questions 1. LOCATION: "Where does it hurt?"       Middle, upper portion of chest 2. RADIATION: "Does the pain go anywhere else?" (e.g., into neck, jaw, arms, back)     na 3. ONSET: "When did the chest pain begin?" (Minutes, hours or days)      2 weeks ago and off and on past weekend getting worse with SOB 4. PATTERN "Does the pain come and go, or has it been constant since it started?"  "Does it get worse with exertion?"      Comes and goes. Not lasting 5 minutes 5. DURATION: "How long does it last" (e.g., seconds, minutes, hours)     Less than 5 minutes 6. SEVERITY: "How bad is the pain?"  (e.g., Scale 1-10; mild, moderate, or severe)    - MILD (1-3): doesn't interfere with normal activities     - MODERATE (4-7): interferes with normal activities or awakens from sleep    - SEVERE (8-10): excruciating pain, unable to do any normal activities       mild 7. CARDIAC RISK FACTORS: "Do you have any history of heart problems or risk factors for heart disease?" (e.g., angina, prior heart attack; diabetes, high blood pressure, high cholesterol, smoker, or strong family history of heart disease)     Heart issues, A- fib , HTN  8. PULMONARY RISK FACTORS: "Do you have any history of lung disease?"  (e.g., blood clots in lung, asthma, emphysema, birth control pills)     na 9. CAUSE: "What do you think is causing the chest pain?"     Not sure  10. OTHER SYMPTOMS: "Do you have any other symptoms?" (e.g., dizziness, nausea, vomiting, sweating, fever, difficulty breathing, cough)       Shortness of breath 11. PREGNANCY: "Is there any chance you are pregnant?" "When was your last menstrual period?"       na  Protocols used: Chest Pain-A-AH

## 2021-05-26 ENCOUNTER — Other Ambulatory Visit: Payer: Self-pay

## 2021-05-26 ENCOUNTER — Encounter: Payer: Self-pay | Admitting: Physician Assistant

## 2021-05-26 ENCOUNTER — Ambulatory Visit: Payer: Self-pay | Attending: Nurse Practitioner | Admitting: Physician Assistant

## 2021-05-26 DIAGNOSIS — E0500 Thyrotoxicosis with diffuse goiter without thyrotoxic crisis or storm: Secondary | ICD-10-CM

## 2021-05-26 DIAGNOSIS — I480 Paroxysmal atrial fibrillation: Secondary | ICD-10-CM

## 2021-05-26 DIAGNOSIS — I10 Essential (primary) hypertension: Secondary | ICD-10-CM

## 2021-05-26 DIAGNOSIS — E1169 Type 2 diabetes mellitus with other specified complication: Secondary | ICD-10-CM

## 2021-05-26 DIAGNOSIS — E7800 Pure hypercholesterolemia, unspecified: Secondary | ICD-10-CM

## 2021-05-26 MED ORDER — GABAPENTIN 100 MG PO CAPS
ORAL_CAPSULE | Freq: Every day | ORAL | 3 refills | Status: DC
  Filled 2021-05-26: qty 30, 30d supply, fill #0
  Filled 2021-07-16: qty 30, 30d supply, fill #1
  Filled 2021-11-19: qty 30, 30d supply, fill #2
  Filled 2021-12-23: qty 30, 30d supply, fill #0

## 2021-05-26 MED ORDER — LISINOPRIL 2.5 MG PO TABS
ORAL_TABLET | ORAL | 1 refills | Status: DC
  Filled 2021-05-26: qty 60, 30d supply, fill #0
  Filled 2021-07-16: qty 60, 30d supply, fill #1
  Filled 2021-08-18: qty 60, 30d supply, fill #2
  Filled 2021-09-23: qty 60, 30d supply, fill #3
  Filled 2021-10-24: qty 60, 30d supply, fill #4
  Filled 2021-12-21: qty 60, 30d supply, fill #5
  Filled 2021-12-22: qty 60, 30d supply, fill #0

## 2021-05-26 MED ORDER — ATORVASTATIN CALCIUM 20 MG PO TABS
ORAL_TABLET | Freq: Every day | ORAL | 0 refills | Status: DC
  Filled 2021-05-26: qty 90, fill #0
  Filled 2021-06-17: qty 30, 30d supply, fill #0
  Filled 2021-07-16: qty 30, 30d supply, fill #1
  Filled 2021-08-18: qty 30, 30d supply, fill #2

## 2021-05-26 MED ORDER — GLIPIZIDE 10 MG PO TABS
ORAL_TABLET | Freq: Two times a day (BID) | ORAL | 1 refills | Status: DC
  Filled 2021-05-26: qty 180, fill #0
  Filled 2021-06-17: qty 60, 30d supply, fill #0
  Filled 2021-07-16: qty 60, 30d supply, fill #1
  Filled 2021-10-24: qty 60, 30d supply, fill #2
  Filled 2021-12-21: qty 60, 30d supply, fill #3
  Filled 2021-12-22: qty 60, 30d supply, fill #0
  Filled 2022-01-23: qty 60, 30d supply, fill #1

## 2021-05-26 MED ORDER — ELIQUIS 5 MG PO TABS
ORAL_TABLET | ORAL | 1 refills | Status: DC
  Filled 2021-05-26: qty 180, 90d supply, fill #0

## 2021-05-26 MED ORDER — METFORMIN HCL ER 500 MG PO TB24
ORAL_TABLET | ORAL | 1 refills | Status: DC
  Filled 2021-05-26: qty 360, fill #0
  Filled 2021-07-03: qty 120, 30d supply, fill #0
  Filled 2021-08-18: qty 120, 30d supply, fill #1
  Filled 2021-11-19: qty 120, 30d supply, fill #2
  Filled 2021-12-23: qty 120, 30d supply, fill #0
  Filled 2022-01-24: qty 120, 30d supply, fill #1

## 2021-05-26 MED ORDER — TRULICITY 1.5 MG/0.5ML ~~LOC~~ SOAJ
1.5000 mg | SUBCUTANEOUS | 2 refills | Status: DC
  Filled 2021-05-26 – 2021-07-02 (×2): qty 6, 84d supply, fill #0
  Filled 2021-10-18: qty 6, 84d supply, fill #1
  Filled 2022-01-24: qty 2, 28d supply, fill #0
  Filled 2022-03-15: qty 2, 28d supply, fill #1
  Filled 2022-04-05: qty 2, 28d supply, fill #2

## 2021-05-26 NOTE — Telephone Encounter (Signed)
Pt had appt with PA McClung on 05/26/2021

## 2021-05-26 NOTE — Progress Notes (Signed)
Patient ID: Haley [name], female   DOB: [birth year], 55 y.o.   MRN: [id number] Virtual Visit via Telephone Note  I connected with Haley [name] on 05/26/21 at  3:30 PM EDT by telephone and verified that I am speaking with the correct person using two identifiers.  Location: Patient: home Provider: [facility] office   I discussed the limitations, risks, security and privacy concerns of performing an evaluation and management service by telephone and the availability of in person appointments. I also discussed with the patient that there may be a patient responsible charge related to this service. The patient expressed understanding and agreed to proceed.   History of Present Illness:  patient needs RF on meds.  Occasionally has a pounding in her chest.  No SOB.  No CP.  Her blood sugars are running in the low 100s althoutgh she does admit to only checking them a few times a week.  She is due for bloodwork.  Weights stable.  Last weight in epic 297.  Weight at home today 293    Observations/Objective:  NAD.  A&Ox3   Assessment and Plan:  1. Pure hypercholesterolemia - atorvastatin (LIPITOR) 20 MG tablet; TAKE 1 TABLET (20 MG TOTAL) BY MOUTH DAILY.  Dispense: 90 tablet; Refill: 0 - Comprehensive metabolic panel  2. Type 2 diabetes mellitus with other specified complication, without long-term current use of insulin ([place]) Sounds like good control based on home readings -last A1C=6.4 02/2021 - Dulaglutide (TRULICITY) 1.5 KZ/9.9JT SOPN; INJECT 1.5 MG INTO THE SKIN ONCE A WEEK.  Dispense: 6 mL; Refill: 2 - gabapentin (NEURONTIN) 100 MG capsule; TAKE 1 CAPSULE (100 MG TOTAL) BY MOUTH AT BEDTIME.  Dispense: 30 capsule; Refill: 3 - glipiZIDE (GLUCOTROL) 10 MG tablet; TAKE 1 TABLET (10 MG TOTAL) BY MOUTH 2 (TWO) TIMES DAILY BEFORE A MEAL.  Dispense: 180 tablet; Refill: 1 - lisinopril (ZESTRIL) 2.5 MG tablet; TAKE 2 TABLETS (5 MG TOTAL) BY MOUTH DAILY.  Dispense: 180 tablet; Refill: 1 - metFORMIN  (GLUCOPHAGE-XR) 500 MG 24 hr tablet; TAKE 4 TABLETS (2,000 MG TOTAL) BY MOUTH AT BEDTIME.  Dispense: 360 tablet; Refill: 1 - Hemoglobin A1c; Future - Comprehensive metabolic panel  3. Essential hypertension  - lisinopril (ZESTRIL) 2.5 MG tablet; TAKE 2 TABLETS (5 MG TOTAL) BY MOUTH DAILY.  Dispense: 180 tablet; Refill: 1  4. Graves disease - Thyroid Panel With TSH; Future  5. Paroxysmal atrial fibrillation (HCC) continue - apixaban (ELIQUIS) 5 MG TABS tablet; TAKE 1 TABLET (5 MG TOTAL) BY MOUTH 2 (TWO) TIMES DAILY.  Dispense: 180 tablet; Refill: 1 - Comprehensive metabolic panel  Patient is instructed to call 911 if she has severe palpitations, CP/SOB.  She verbalizes understanding and agrees.    Follow Up Instructions: See PCP in ~3months for chronic conditions   I discussed the assessment and treatment plan with the patient. The patient was provided an opportunity to ask questions and all were answered. The patient agreed with the plan and demonstrated an understanding of the instructions.   The patient was advised to call back or seek an in-person evaluation if the symptoms worsen or if the condition fails to improve as anticipated.  I provided 14 minutes of non-face-to-face time during this encounter.   Freeman Caldron, PA-C

## 2021-05-27 ENCOUNTER — Other Ambulatory Visit: Payer: Self-pay

## 2021-05-27 NOTE — Addendum Note (Signed)
Addended bySteffanie Dunn on: 05/27/2021 02:26 PM   Modules accepted: Orders

## 2021-05-29 LAB — COMPREHENSIVE METABOLIC PANEL
ALT: 14 IU/L (ref 0–32)
AST: 15 IU/L (ref 0–40)
Albumin/Globulin Ratio: 1.8 (ref 1.2–2.2)
Albumin: 4.7 g/dL (ref 3.8–4.9)
Alkaline Phosphatase: 61 IU/L (ref 44–121)
BUN/Creatinine Ratio: 15 (ref 9–23)
BUN: 12 mg/dL (ref 6–24)
Bilirubin Total: 0.3 mg/dL (ref 0.0–1.2)
CO2: 25 mmol/L (ref 20–29)
Calcium: 10.1 mg/dL (ref 8.7–10.2)
Chloride: 102 mmol/L (ref 96–106)
Creatinine, Ser: 0.82 mg/dL (ref 0.57–1.00)
Globulin, Total: 2.6 g/dL (ref 1.5–4.5)
Glucose: 88 mg/dL (ref 65–99)
Potassium: 4.6 mmol/L (ref 3.5–5.2)
Sodium: 142 mmol/L (ref 134–144)
Total Protein: 7.3 g/dL (ref 6.0–8.5)
eGFR: 85 mL/min/{1.73_m2} (ref >59)

## 2021-05-29 LAB — THYROID PANEL WITH TSH
Free Thyroxine Index: 1.7 (ref 1.2–4.9)
T3 Uptake Ratio: 30 % (ref 24–39)
T4, Total: 5.7 ug/dL (ref 4.5–12.0)
TSH: 1.37 u[IU]/mL (ref 0.450–4.500)

## 2021-05-29 LAB — HEMOGLOBIN A1C
Est. average glucose Bld gHb Est-mCnc: 140 mg/dL
Hgb A1c MFr Bld: 6.5 % — ABNORMAL HIGH (ref 4.8–5.6)

## 2021-06-02 ENCOUNTER — Encounter: Payer: Self-pay | Admitting: Internal Medicine

## 2021-06-02 ENCOUNTER — Other Ambulatory Visit: Payer: Self-pay

## 2021-06-09 ENCOUNTER — Telehealth: Payer: Self-pay | Admitting: *Deleted

## 2021-06-09 NOTE — Telephone Encounter (Signed)
Patient called in to say that she feel like she is not "getting enough air." She was informed that per her download that I pulled she is doing fine. Her AHI is 2.2. I asked the patient when she last changed her mask , and she states that it has been a while. I told her that I will show Dr Claiborne Billings her download to see if he wants to make any changes. Also she can come by and I will give her a new mask sample since she does not have an insurance. Patient agree with plan.

## 2021-06-10 ENCOUNTER — Telehealth: Payer: Self-pay | Admitting: *Deleted

## 2021-06-10 NOTE — Telephone Encounter (Signed)
Patient came by the office to get a sample mask. She was informed that Dr Claiborne Billings reviewed her Download and made some changes to her  BIPAP pressures. They will take effect when she turns her machine on tonight.

## 2021-06-17 ENCOUNTER — Other Ambulatory Visit: Payer: Self-pay

## 2021-06-17 ENCOUNTER — Telehealth: Payer: Self-pay | Admitting: Internal Medicine

## 2021-06-17 DIAGNOSIS — E0500 Thyrotoxicosis with diffuse goiter without thyrotoxic crisis or storm: Secondary | ICD-10-CM

## 2021-06-17 NOTE — Telephone Encounter (Signed)
MEDICATION: methimazole  PHARMACY:   [facility] [place] Phone:  [phone]  Fax:  [phone]      HAS THE PATIENT CONTACTED THEIR PHARMACY?  no  IS THIS A 90 DAY SUPPLY : yes  IS PATIENT OUT OF MEDICATION: yes  IF NOT; HOW MUCH IS LEFT:   LAST APPOINTMENT DATE: @4 /10/2021  NEXT APPOINTMENT DATE:@8 /15/2022    **Let patient know to contact pharmacy at the end of the day to make sure medication is ready. **  ** Please notify patient to allow 48-72 hours to process**  **Encourage patient to contact the pharmacy for refills or they can request refills through [facility]**

## 2021-06-18 ENCOUNTER — Other Ambulatory Visit: Payer: Self-pay

## 2021-06-18 ENCOUNTER — Telehealth: Payer: Self-pay | Admitting: Nurse Practitioner

## 2021-06-18 MED ORDER — METHIMAZOLE 5 MG PO TABS
2.5000 mg | ORAL_TABLET | Freq: Every day | ORAL | 0 refills | Status: DC
  Filled 2021-06-18 – 2021-07-16 (×2): qty 45, 90d supply, fill #0

## 2021-06-18 NOTE — Telephone Encounter (Signed)
Rx sent to preferred pharmacy.

## 2021-06-18 NOTE — Telephone Encounter (Signed)
Pt has been called and informed that she will be receiving a dental listing via mail.

## 2021-06-18 NOTE — Telephone Encounter (Signed)
Copied from [place] [phone]. Topic: General - Other >> Jun 18, 2021 11:45 AM Pawlus, Brayton Layman A wrote: Reason for CRM: Pt wanted a call back due to wanting to see a "specialist", pt would not give any further info, just wanted to discuss with a nurse. Please advise.  Called patient to get more information and patient requested the nurse or doctor returns the call.

## 2021-07-02 ENCOUNTER — Other Ambulatory Visit: Payer: Self-pay

## 2021-07-05 ENCOUNTER — Other Ambulatory Visit: Payer: Self-pay

## 2021-07-16 ENCOUNTER — Other Ambulatory Visit: Payer: Self-pay

## 2021-07-19 ENCOUNTER — Ambulatory Visit: Payer: Self-pay | Admitting: Internal Medicine

## 2021-07-19 NOTE — Progress Notes (Deleted)
Patient ID: [name], female   DOB: 12/04/1966, 55 y.o.   MRN: [id number]   This visit occurred during the SARS-CoV-2 public health emergency.  Safety protocols were in place, including screening questions prior to the visit, additional usage of staff PPE, and extensive cleaning of exam room while observing appropriate contact time as indicated for disinfecting solutions.   HPI  Haley [name] is a 55 y.o.-year-old female, initially referred by her PCP, Haley [name], returning for follow-up for thyrotoxicosis, diagnosed as Graves' disease at last visit and also large goiter.  Last visit 4 months ago.  Interim history: Before last visit, she complained of shortness of breath and she was having oxygen desaturation.  She was not on oxygen during the day but on BiPAP at night, started prior to the visit. Since last visit, she saw Haley [name].  She opted to continue to manage her goiter conservatively, rather than undergoing surgery. She continues to complain of pain in her knees and difficulty walking.  Reviewed and addended history: Patient has a history of thyrotoxicosis since 2010. At that time she was hospitalized with pericarditis and Acute heart failure.   After diagnosis, she was started on a medication (? MMI) >> she could not tolerate 2/2 extreme fatigue >> changed to another medication (? PTU).  Tests normalized afterwards and she was taken off the medication.    However, she again started to have symptoms: Weakness, shortness of breath.  The TSH was rechecked and this was low while her free T4 was high.   She was started on methimazole and Toprol.  When I last saw her, her TSH returned elevated and a free T4 returned low.  We decreased her methimazole and Toprol.  Due to insomnia,  we increased her metoprolol to the previous dose, but gave her the higher dose at night.   She is treated with: - Methimazole 5 mg 3 times a day  >> 5 mg twice a day >> 5 mg once a  day >> 2.5 mg once a day - Toprol-XL 50 mg daily + 25 mg at night >> 50 mg daily >> 25 mg in a.m. and 50 mg at night  She has no complaints on the above regimen.  I reviewed her TFTs: Lab Results  Component Value Date   TSH 1.370 05/27/2021   TSH 6.08 (H) 03/05/2021   TSH 12.500 (H) 11/24/2020   TSH 0.007 (L) 08/28/2020   TSH 0.73 04/23/2020   TSH 4.51 (H) 01/24/2020   Lab Results  Component Value Date   FREET4 0.61 03/05/2021   FREET4 1.63 08/28/2020   FREET4 0.75 04/23/2020   FREET4 0.45 (L) 01/24/2020   FREET4 1.33 (H) 12/22/2019   No components found for: FREET3   Her TSI antibodies were elevated: Lab Results  Component Value Date   TSI 658 (H) 01/24/2020    She also has a large goiter with substernal extension.  Thyroid ultrasound (05/08/2019): Enlarged thyroid, no nodules. Inferior margin of the left thyroid lobe is difficult to visualize due to the substernal extension.  CXR (12/21/2019): Per review of the images, trachea is slightly deviated to the right  CT chest (12/22/2019): Stable, large goiter, extending into the substernal space  She saw Haley [name] 05/20/2021 and after discussion with him, she wanted to continue to manage her goiter conservatively.  At the time of her diagnosis, she complained of: - weight gain, however, she does have weight loss per review of the chart, approximately  7 [name] net in the last month - Heat intolerance-chronic - Poor sleep - Shortness of breath - Leg swelling - Hair loss  Sleep and hair loss have improved.  She continues to have shortness of breath, but possibly also related to deconditioning and menopause.  She also continues to have fatigue, hot flashes, palpitations.  She sees cardiology (Dr. Claiborne [name]).   Pt denies: - feeling nodules in neck - hoarseness - dysphagia - choking But she does have shortness of breath.  Pt does have a FH of thyroid ds.: M aunt and M cousin. No FH of thyroid cancer. No h/o radiation tx  to head or neck.  No herbal supplements. No Biotin use. No recent steroids use.   Patient also has CHF, and also severe OSA-started on CPAP in 12/2019 -felt much better afterwards. She had to stop as her hose broke >> started BiPAP.  Last year, she started exercising: Stationary bike, walking, chair aerobics, weightlifting.  She has DM2 >> last HbA1c: Lab Results  Component Value Date   HGBA1C 6.5 (H) 05/27/2021    ROS: Constitutional: no weight gain/no weight loss, no fatigue, + subjective hyperthermia, no subjective hypothermia Eyes: no blurry vision, no xerophthalmia ENT: no sore throat, no nodules palpated in neck, no dysphagia, no odynophagia, no hoarseness Cardiovascular: no CP/+ SOB/+ palpitations/no leg swelling Respiratory: no cough/+ SOB/no wheezing Gastrointestinal: no N/no V/no D/no C/no acid reflux Musculoskeletal: no muscle aches/+ joint aches Skin: no rashes, no hair loss Neurological: no tremors/no numbness/no tingling/no dizziness  I reviewed pt's medications, allergies, PMH, social hx, family hx, and changes were documented in the history of present illness. Otherwise, unchanged from my initial visit note.  Past Medical History:  Diagnosis Date   Essential hypertension    Fibroids    Hyperthyroidism    a. 2010, treated w/ tapazole.   Morbid obesity ([place])    Paroxysmal atrial fibrillation ([place])    a. 03/2009 - in setting of hyperthyroidism and caffeine intake, converted spontaneously;  b. 08/2016 ED visit for recurrent PAF-->successful DCCV in ED.   Snores    Type II diabetes mellitus (HCC)    Past Surgical History:  Procedure Laterality Date   UTERINE FIBROID SURGERY     Social History   Socioeconomic History   Marital status: Married    Spouse name: Not on file   Number of children: 0   Years of education: Not on file   Highest education level: Not on file  Occupational History   Occupation:  Charity fundraiser, Haematologist  Tobacco Use   Smoking  status: Former Smoker, quit in 2018    Packs/day: 1.00    Years: 32.00    Pack years: 32.00    Quit date: 2012    Years since quitting: 9.1   Smokeless tobacco: Never Used   Tobacco comment: patient vapes  Substance and Sexual Activity   Alcohol use: No    Comment: Wine, 2-3 glasses, every 3 weeks   Drug use: No  Social History Narrative   Lives in [place] with husband.  Systems analyst.  Also in school @ [employer] for [employer].     Social Determinants of Health   Financial Resource Strain:    Difficulty of Paying Living Expenses: Not on file  Food Insecurity:    Worried About Charity fundraiser in the Last Year: Not on file   Ran Out of Food in the Last Year: Not on file  Transportation Needs:    Lack of  Transportation (Medical): Not on file   Lack of Transportation (Non-Medical): Not on file  Physical Activity:    Days of Exercise per Week: Not on file   Minutes of Exercise per Session: Not on file  Stress:    Feeling of Stress : Not on file  Social Connections:    Frequency of Communication with Friends and Family: Not on file   Frequency of Social Gatherings with Friends and Family: Not on file   Attends Religious Services: Not on file   Active Member of Clubs or Organizations: Not on file   Attends Archivist Meetings: Not on file   Marital Status: Not on file  Intimate Partner Violence:    Fear of Current or Ex-Partner: Not on file   Emotionally Abused: Not on file   Physically Abused: Not on file   Sexually Abused: Not on file   Current Outpatient Medications on File Prior to Visit  Medication Sig Dispense Refill   apixaban (ELIQUIS) 5 MG TABS tablet TAKE 1 TABLET (5 MG TOTAL) BY MOUTH 2 (TWO) TIMES DAILY. 180 tablet 1   atorvastatin (LIPITOR) 20 MG tablet TAKE 1 TABLET (20 MG TOTAL) BY MOUTH DAILY. 90 tablet 0   Blood Glucose Monitoring Suppl (TRUE METRIX METER) w/Device KIT Use twice daily 1 kit 0   Dulaglutide (TRULICITY) 1.5 QQ/7.6PP SOPN  INJECT 1.5 MG INTO THE SKIN ONCE A WEEK. 6 mL 2   furosemide (LASIX) 20 MG tablet Take 1 tablet (20 mg total) by mouth as needed for edema. 30 tablet 2   gabapentin (NEURONTIN) 100 MG capsule TAKE 1 CAPSULE (100 MG TOTAL) BY MOUTH AT BEDTIME. 30 capsule 3   glipiZIDE (GLUCOTROL) 10 MG tablet TAKE 1 TABLET (10 MG TOTAL) BY MOUTH 2 (TWO) TIMES DAILY BEFORE A MEAL. 180 tablet 1   glucose blood (TRUE METRIX BLOOD GLUCOSE TEST) test strip Use as instructed. Check blood glucose level by fingerstick twice per day. 200 each 12   lisinopril (ZESTRIL) 2.5 MG tablet TAKE 2 TABLETS (5 MG TOTAL) BY MOUTH DAILY. 180 tablet 1   metFORMIN (GLUCOPHAGE-XR) 500 MG 24 hr tablet TAKE 4 TABLETS (2,000 MG TOTAL) BY MOUTH AT BEDTIME. 360 tablet 1   methimazole (TAPAZOLE) 5 MG tablet Take 0.5 tablets (2.5 mg total) by mouth daily. 45 tablet 0   Methylsulfonylmethane (MSM PO) Take 2 tablets by mouth daily.     metoprolol succinate (TOPROL-XL) 50 MG 24 hr tablet TAKE 1 TABLET (50 MG TOTAL) BY MOUTH DAILY. 30 tablet 2   Multiple Vitamins-Minerals (ALIVE WOMENS 50+) TABS Take 1 tablet by mouth daily.     naproxen sodium (ALEVE) 220 MG tablet Take 220 mg by mouth daily as needed.     TRUEplus Lancets 28G MISC Use twice daily for blood glucose check 200 each 6   No current facility-administered medications on file prior to visit.   No Known Allergies Family History  Problem Relation Age of Onset   Hypertension Mother    Diabetes Mellitus II Sister    Cancer Maternal Grandmother     PE: There were no vitals taken for this visit. Wt Readings from Last 3 Encounters:  02/25/21 297 lb 6.4 oz (134.9 kg)  02/17/21 297 lb 6.4 oz (134.9 kg)  02/15/21 298 lb 12.8 oz (135.5 kg)   Constitutional: overweight, in NAD Eyes: PERRLA, EOMI, no exophthalmos, no lid lag, no stare ENT: moist mucous membranes, no palpated thyromegaly, no cervical lymphadenopathy Cardiovascular: RRR, No MRG, no LE edema  Respiratory: CTA  B Gastrointestinal: abdomen soft, NT, ND, BS+ Musculoskeletal: no deformities, strength intact in all 4 Skin: moist, warm, no rashes Neurological: no tremor with outstretched hands, DTR normal in all 4  ASSESSMENT: 1.  Graves' disease  2.  Goiter  PLAN:  1. Patient with history of Graves' disease and thyrotoxic symptoms: Weight loss, heat intolerance, shortness of breath, weakness.  TSI's were elevated.   -She was started on methimazole, initially 5 mg 3 times a day, then decrease gradually to 2.5 mg daily at last visit -On the above dose, her TSH normalized in 05/2021 -She also continues on beta-blocker: Toprol-XL 25 mg in a.m. and 50 mg at night.  She had problems sleeping when she tried to reduce the dose at night.  She has a history of palpitations, possibly also related to her heart disease. -At today's visit, we will recheck her TFTs and adjust the methimazole dose accordingly -At last visit, we discussed about treatment for Graves' disease.  She is responding well to methimazole, however, it I suggested a referral to surgery due to her significant goiter -  see below -She denies signs of Graves' ophthalmopathy: No double vision, blurry vision, eye pain, chemosis -I will see her back in 6 months  2.  Goiter -This is large and extending in the mediastinum.  On the chest x-ray from 12/20/2019, her trachea was slightly deviated to the right -She does not have significant neck compression symptoms, interestingly.  In the past, she was complaining of saliva catching in her throat occasionally and some cough, but not bothersome.  Her symptoms improved after starting CPAP, but she was off of this after her house broke.  Now on BiPAP. -At last visit, she had low oxygen saturation: 87%.  She continues to feel short of breath and tired.  While her shortness of breath and oxygen desaturation could be related to obstructive sleep apnea and deconditioning, we also discussed about the possibility of  her goiter compressing her trachea.  I referred her to surgery.  After she saw Haley [name], she decided to manage her goiter conservatively. -At today's visit, she has no neck compression complaints. -For now, we will continue to just follow her for this  Philemon Kingdom, MD PhD [facility] Endocrinology

## 2021-08-12 ENCOUNTER — Ambulatory Visit: Payer: Self-pay | Admitting: Cardiovascular Disease

## 2021-08-18 ENCOUNTER — Other Ambulatory Visit: Payer: Self-pay | Admitting: Family Medicine

## 2021-08-18 DIAGNOSIS — I10 Essential (primary) hypertension: Secondary | ICD-10-CM

## 2021-08-19 ENCOUNTER — Other Ambulatory Visit: Payer: Self-pay

## 2021-08-19 MED ORDER — METOPROLOL SUCCINATE ER 50 MG PO TB24
ORAL_TABLET | Freq: Every day | ORAL | 2 refills | Status: DC
  Filled 2021-08-19: qty 30, 30d supply, fill #0
  Filled 2021-09-23: qty 30, 30d supply, fill #1

## 2021-08-20 ENCOUNTER — Other Ambulatory Visit: Payer: Self-pay | Admitting: Internal Medicine

## 2021-08-20 ENCOUNTER — Other Ambulatory Visit: Payer: Self-pay

## 2021-08-20 DIAGNOSIS — E0500 Thyrotoxicosis with diffuse goiter without thyrotoxic crisis or storm: Secondary | ICD-10-CM

## 2021-08-21 MED ORDER — METHIMAZOLE 5 MG PO TABS
2.5000 mg | ORAL_TABLET | Freq: Every day | ORAL | 0 refills | Status: DC
  Filled 2021-08-21: qty 45, 90d supply, fill #0

## 2021-08-23 ENCOUNTER — Other Ambulatory Visit: Payer: Self-pay

## 2021-08-23 ENCOUNTER — Ambulatory Visit: Payer: Self-pay | Attending: Nurse Practitioner | Admitting: Nurse Practitioner

## 2021-08-23 ENCOUNTER — Encounter: Payer: Self-pay | Admitting: Nurse Practitioner

## 2021-08-23 VITALS — BP 117/77 | HR 79 | Ht 67.0 in | Wt 304.0 lb

## 2021-08-23 DIAGNOSIS — Z23 Encounter for immunization: Secondary | ICD-10-CM

## 2021-08-23 DIAGNOSIS — M170 Bilateral primary osteoarthritis of knee: Secondary | ICD-10-CM

## 2021-08-23 DIAGNOSIS — Z1231 Encounter for screening mammogram for malignant neoplasm of breast: Secondary | ICD-10-CM

## 2021-08-23 DIAGNOSIS — I10 Essential (primary) hypertension: Secondary | ICD-10-CM

## 2021-08-23 MED ORDER — TRAMADOL HCL 50 MG PO TABS
50.0000 mg | ORAL_TABLET | Freq: Two times a day (BID) | ORAL | 0 refills | Status: AC | PRN
  Filled 2021-08-23: qty 60, 30d supply, fill #0

## 2021-08-23 NOTE — Progress Notes (Signed)
Assessment & Plan:  Haley [name] was seen today for hypertension.  Diagnoses and all orders for this visit:  Essential hypertension  Breast cancer screening by mammogram -     MS DIGITAL SCREENING TOMO BILATERAL; Future  Primary osteoarthritis of both knees -     traMADol (ULTRAM) 50 MG tablet; Take 1 tablet (50 mg total) by mouth every 12 (twelve) hours as needed.   Patient has been counseled on age-appropriate routine health concerns for screening and prevention. These are reviewed and up-to-date. Referrals have been placed accordingly. Immunizations are up-to-date or declined.    Subjective:   Chief Complaint  Patient presents with   Hypertension   Hypertension Pertinent negatives include no blurred vision, chest pain, headaches, malaise/fatigue, palpitations or shortness of breath.  Haley [name] 55 y.o. female presents to office today for follow up to HTN  has a past medical history of Essential hypertension, Fibroids, Hyperthyroidism, Morbid obesity ([place]), Paroxysmal atrial fibrillation ([place]), Snores, and Type II diabetes mellitus ([place]).  She sees Dr. Renne Crigler for her thyroid. Will possibly need surgical intervention. Currently taking methimazole daily as prescribed.    HTN Well controlled. She does not monitor her blood pressure at home.  Denies chest pain, palpitations, lightheadedness, dizziness, headaches or BLE edema.  She does endorse intermittent BLE edema. Taking lasix prn for this. She takes metoprolol for history of Afib.  BP Readings from Last 3 Encounters:  08/23/21 117/77  02/25/21 128/82  02/17/21 128/76      OA She has B/L Knee OA. Pain can be severe enough to cause shortness of breath at times. Associated symptoms: popping sensation in both knees, swelling, crepitus and stiffness early mornings.  BMI 47 She has had knee pain for several years with abnormal xrays of both knees. Patient has been advised to apply for financial assistance and schedule to see  our financial counselor.     Review of Systems  Constitutional:  Negative for fever, malaise/fatigue and weight loss.  HENT: Negative.  Negative for nosebleeds.   Eyes: Negative.  Negative for blurred vision, double vision and photophobia.  Respiratory: Negative.  Negative for cough and shortness of breath.   Cardiovascular: Negative.  Negative for chest pain, palpitations and leg swelling.  Gastrointestinal: Negative.  Negative for heartburn, nausea and vomiting.  Musculoskeletal:  Positive for joint pain. Negative for myalgias.  Neurological: Negative.  Negative for dizziness, focal weakness, seizures and headaches.  Psychiatric/Behavioral: Negative.  Negative for suicidal ideas.    Past Medical History:  Diagnosis Date   Essential hypertension    Fibroids    Hyperthyroidism    a. 2010, treated w/ tapazole.   Morbid obesity ([place])    Paroxysmal atrial fibrillation ([place])    a. 03/2009 - in setting of hyperthyroidism and caffeine intake, converted spontaneously;  b. 08/2016 ED visit for recurrent PAF-->successful DCCV in ED.   Snores    Type II diabetes mellitus (HCC)     Past Surgical History:  Procedure Laterality Date   UTERINE FIBROID SURGERY      Family History  Problem Relation Age of Onset   Hypertension Mother    Diabetes Mellitus II Sister    Cancer Maternal Grandmother     Social History Reviewed with no changes to be made today.   Outpatient Medications Prior to Visit  Medication Sig Dispense Refill   apixaban (ELIQUIS) 5 MG TABS tablet TAKE 1 TABLET (5 MG TOTAL) BY MOUTH 2 (TWO) TIMES DAILY. 180 tablet 1   atorvastatin (  LIPITOR) 20 MG tablet TAKE 1 TABLET (20 MG TOTAL) BY MOUTH DAILY. 90 tablet 0   Blood Glucose Monitoring Suppl (TRUE METRIX METER) w/Device KIT Use twice daily 1 kit 0   Dulaglutide (TRULICITY) 1.5 BV/6.9IH SOPN INJECT 1.5 MG INTO THE SKIN ONCE A WEEK. 6 mL 2   furosemide (LASIX) 20 MG tablet Take 1 tablet (20 mg total) by mouth as needed for  edema. 30 tablet 2   gabapentin (NEURONTIN) 100 MG capsule TAKE 1 CAPSULE (100 MG TOTAL) BY MOUTH AT BEDTIME. 30 capsule 3   glipiZIDE (GLUCOTROL) 10 MG tablet TAKE 1 TABLET (10 MG TOTAL) BY MOUTH 2 (TWO) TIMES DAILY BEFORE A MEAL. 180 tablet 1   glucose blood (TRUE METRIX BLOOD GLUCOSE TEST) test strip Use as instructed. Check blood glucose level by fingerstick twice per day. 200 each 12   lisinopril (ZESTRIL) 2.5 MG tablet TAKE 2 TABLETS (5 MG TOTAL) BY MOUTH DAILY. 180 tablet 1   metFORMIN (GLUCOPHAGE-XR) 500 MG 24 hr tablet TAKE 4 TABLETS (2,000 MG TOTAL) BY MOUTH AT BEDTIME. 360 tablet 1   methimazole (TAPAZOLE) 5 MG tablet Take 0.5 tablets (2.5 mg total) by mouth daily. 45 tablet 0   Methylsulfonylmethane (MSM PO) Take 2 tablets by mouth daily.     metoprolol succinate (TOPROL-XL) 50 MG 24 hr tablet TAKE 1 TABLET (50 MG TOTAL) BY MOUTH DAILY. 30 tablet 2   Multiple Vitamins-Minerals (ALIVE WOMENS 50+) TABS Take 1 tablet by mouth daily.     naproxen sodium (ALEVE) 220 MG tablet Take 220 mg by mouth daily as needed.     TRUEplus Lancets 28G MISC Use twice daily for blood glucose check 200 each 6   No facility-administered medications prior to visit.    No Known Allergies     Objective:    BP 117/77   Pulse 79   Ht _0  (1.702 m)   Wt (!) 304 lb (137.9 kg)   SpO2 94%   BMI 47.61 kg/m  Wt Readings from Last 3 Encounters:  08/23/21 (!) 304 lb (137.9 kg)  02/25/21 297 lb 6.4 oz (134.9 kg)  02/17/21 297 lb 6.4 oz (134.9 kg)    Physical Exam Vitals and nursing note reviewed.  Constitutional:      Appearance: She is well-developed.  HENT:     Head: Normocephalic and atraumatic.  Cardiovascular:     Rate and Rhythm: Normal rate and regular rhythm.     Heart sounds: Normal heart sounds. No murmur heard.   No friction rub. No gallop.  Pulmonary:     Effort: Pulmonary effort is normal. No tachypnea or respiratory distress.     Breath sounds: Normal breath sounds. No decreased  breath sounds, wheezing, rhonchi or rales.  Chest:     Chest wall: No tenderness.  Abdominal:     General: Bowel sounds are normal.     Palpations: Abdomen is soft.  Musculoskeletal:        General: Normal range of motion.     Cervical back: Normal range of motion.  Skin:    General: Skin is warm and dry.  Neurological:     Mental Status: She is alert and oriented to person, place, and time.     Coordination: Coordination normal.  Psychiatric:        Behavior: Behavior normal. Behavior is cooperative.        Thought Content: Thought content normal.        Judgment: Judgment normal.  Patient has been counseled extensively about nutrition and exercise as well as the importance of adherence with medications and regular follow-up. The patient was given clear instructions to go to ER or return to medical center if symptoms don't improve, worsen or new problems develop. The patient verbalized understanding.   Follow-up: Return in about 3 months (around 11/22/2021) for NEEDS ORANGE CARD/CAFA APP.   [name], FNP-BC [facility] and [facility], [place]   08/23/2021, 10:37 AM

## 2021-08-27 ENCOUNTER — Ambulatory Visit: Payer: Self-pay | Admitting: Nurse Practitioner

## 2021-09-14 ENCOUNTER — Other Ambulatory Visit: Payer: Self-pay

## 2021-09-23 ENCOUNTER — Other Ambulatory Visit: Payer: Self-pay | Admitting: Physician Assistant

## 2021-09-23 ENCOUNTER — Other Ambulatory Visit: Payer: Self-pay

## 2021-09-23 DIAGNOSIS — E7800 Pure hypercholesterolemia, unspecified: Secondary | ICD-10-CM

## 2021-09-23 NOTE — Telephone Encounter (Signed)
Requested medications are due for refill today if received 30 day supply, rx was written for 90 day   Requested medications are on the active medication list yes  Last refill 08/20/21  Last visit 05/26/21, last addressed 08/23/21  Future visit scheduled No, lab was 07/02/20  Notes to clinic failed protocol of valid lab within 360 days, please assess.  Requested Prescriptions  Pending Prescriptions Disp Refills   atorvastatin (LIPITOR) 20 MG tablet 90 tablet 0    Sig: TAKE 1 TABLET (20 MG TOTAL) BY MOUTH DAILY.     Cardiovascular:  Antilipid - Statins Failed - 09/23/2021 11:13 AM      Failed - Total Cholesterol in normal range and within 360 days    Cholesterol, Total  Date Value Ref Range Status  07/02/2020 131 100 - 199 mg/dL Final          Failed - LDL in normal range and within 360 days    LDL Chol Calc (NIH)  Date Value Ref Range Status  07/02/2020 66 0 - 99 mg/dL Final          Failed - HDL in normal range and within 360 days    HDL  Date Value Ref Range Status  07/02/2020 53 >39 mg/dL Final          Failed - Triglycerides in normal range and within 360 days    Triglycerides  Date Value Ref Range Status  07/02/2020 56 0 - 149 mg/dL Final          Passed - Patient is not pregnant      Passed - Valid encounter within last 12 months    Recent Outpatient Visits           1 month ago Essential hypertension   [place], Vernia Buff, NP   4 months ago Graves disease   [place], [place], [place]   7 months ago Essential hypertension   [place], [place] W, NP   11 months ago Need for immunization against influenza   [place], RPH-CPP   11 months ago Type 2 diabetes mellitus with other specified complication, without long-term current use of insulin [facility])   [place], Vernia Buff, NP       Future Appointments             In 3 weeks Kroeger, Daleen Snook M., PA-C [facility], [facility]

## 2021-09-24 ENCOUNTER — Other Ambulatory Visit: Payer: Self-pay

## 2021-09-24 MED ORDER — ATORVASTATIN CALCIUM 20 MG PO TABS
ORAL_TABLET | Freq: Every day | ORAL | 2 refills | Status: DC
  Filled 2021-09-24: qty 30, 30d supply, fill #0

## 2021-09-29 ENCOUNTER — Other Ambulatory Visit: Payer: Self-pay

## 2021-09-29 ENCOUNTER — Ambulatory Visit (INDEPENDENT_AMBULATORY_CARE_PROVIDER_SITE_OTHER): Payer: Self-pay | Admitting: Internal Medicine

## 2021-09-29 ENCOUNTER — Encounter: Payer: Self-pay | Admitting: Internal Medicine

## 2021-09-29 VITALS — BP 130/70 | HR 81 | Ht 67.0 in | Wt 305.0 lb

## 2021-09-29 DIAGNOSIS — E0500 Thyrotoxicosis with diffuse goiter without thyrotoxic crisis or storm: Secondary | ICD-10-CM

## 2021-09-29 DIAGNOSIS — E040 Nontoxic diffuse goiter: Secondary | ICD-10-CM

## 2021-09-29 NOTE — Progress Notes (Signed)
Patient ID: Haley [name], female   DOB: 1965/12/18, 55 y.o.   MRN: [id number]   This visit occurred during the SARS-CoV-2 public health emergency.  Safety protocols were in place, including screening questions prior to the visit, additional usage of staff PPE, and extensive cleaning of exam room while observing appropriate contact time as indicated for disinfecting solutions.   HPI  Haley [name] is a 55 y.o.-year-old female, initially referred by her PCP, [name], NP, returning for follow-up for thyrotoxicosis, diagnosed as Graves' disease at last visit and also large goiter.  Last visit 6 months ago.  Interim history: She is on Dr. Harlow Asa since last visit, but declined thyroid surgery at that time. She continues to have shortness of breath and before last visit she also started to have oxygen desaturation.  She continues on BiPAP at night. She still has knee pain and difficulty walking. Husband has metastatic lung CA to the brain >> on ChTx, RxTx. She started Biotin last week -unknown dose.  Reviewed and addended history: Patient has a history of thyrotoxicosis since 2010. At that time she was hospitalized with pericarditis and Acute heart failure.   After diagnosis, she was started on a medication (? MMI) >> she could not tolerate 2/2 extreme fatigue >> changed to another medication (? PTU).  Tests normalized afterwards and she was taken off the medication.    However, she again started to have symptoms: Weakness, shortness of breath.  The TSH was rechecked and this was low while her free T4 was high.   She was started on methimazole and Toprol.  When I last saw her, her TSH returned elevated and a free T4 returned low.  We decreased her methimazole and Toprol.  Due to insomnia,  we increased her metoprolol to the previous dose, but gave her the higher dose at night.  She is treated with: - Methimazole 5 mg 3 times a day  >> 5 mg twice a day >> 5 mg once a day >>  2.5 mg daily (03/2021) - Toprol-XL 50 mg daily + 25 mg at night >> 50 mg daily >> 25 mg in a.m. and 50 mg at night  She feels good on the above regimen.  I reviewed her TFTs: Lab Results  Component Value Date   TSH 1.370 05/27/2021   TSH 6.08 (H) 03/05/2021   TSH 12.500 (H) 11/24/2020   TSH 0.007 (L) 08/28/2020   TSH 0.73 04/23/2020   TSH 4.51 (H) 01/24/2020   Lab Results  Component Value Date   FREET4 0.61 03/05/2021   FREET4 1.63 08/28/2020   FREET4 0.75 04/23/2020   FREET4 0.45 (L) 01/24/2020   FREET4 1.33 (H) 12/22/2019   No components found for: FREET3   Her TSI antibodies were elevated: Lab Results  Component Value Date   TSI 658 (H) 01/24/2020    She also has a large goiter with substernal extension.  Thyroid ultrasound (05/08/2019): Enlarged thyroid, no nodules. Inferior margin of the left thyroid lobe is difficult to visualize due to the substernal extension.  CXR (12/21/2019): Per review of the images, trachea is slightly deviated to the right  CT chest (12/22/2019): Stable, large goiter, extending into the substernal space  At the time of her diagnosis, she complained of: - weight gain, however, she does have weight loss per review of the chart, approximately 7 pounds net in the last month - Heat intolerance-chronic - Poor sleep - Shortness of breath - Leg swelling - Hair loss  She continues to have shortness of breath, possibly also related to deconditioning and also heat intolerance (she is postmenopausal), but her sleep and hair loss improved.    She has hot flashes and palpitations, which are not new.  She sees cardiology (Dr. Claiborne Billings).   Pt denies: - feeling nodules in neck - hoarseness - dysphagia - choking But does have shortness of breath.  She is  Pt does have a FH of thyroid ds.: M aunt and M cousin. No FH of thyroid cancer. No h/o radiation tx to head or neck.  No herbal supplements. No Biotin use. No recent steroids use.   Patient also  has CHF, and also severe OSA-started on CPAP in 12/2019 -felt much better afterwards. She had to stop as her hose broke >> Now on BiPAP.  Last year, she started exercising: Stationary bike, walking, chair aerobics, weightlifting. Now only occasionally, short amounts of time.  She has DM2 >> HbA1c levels reviewed: Lab Results  Component Value Date   HGBA1C 6.5 (H) 05/27/2021   HGBA1C 6.4 (A) 02/17/2021   HGBA1C 7.0 (A) 09/30/2020   ROS: + see HPI  I reviewed pt's medications, allergies, PMH, social hx, family hx, and changes were documented in the history of present illness. Otherwise, unchanged from my initial visit note.  Past Medical History:  Diagnosis Date   Essential hypertension    Fibroids    Hyperthyroidism    a. 2010, treated w/ tapazole.   Morbid obesity ([place])    Paroxysmal atrial fibrillation ([place])    a. 03/2009 - in setting of hyperthyroidism and caffeine intake, converted spontaneously;  b. 08/2016 ED visit for recurrent PAF-->successful DCCV in ED.   Snores    Type II diabetes mellitus (HCC)    Past Surgical History:  Procedure Laterality Date   UTERINE FIBROID SURGERY     Social History   Socioeconomic History   Marital status: Married    Spouse name: Not on file   Number of children: 0   Years of education: Not on file   Highest education level: Not on file  Occupational History   Occupation:  Charity fundraiser, Haematologist  Tobacco Use   Smoking status: Former Smoker, quit in 2018    Packs/day: 1.00    Years: 32.00    Pack years: 32.00    Quit date: 2012    Years since quitting: 9.1   Smokeless tobacco: Never Used   Tobacco comment: patient vapes  Substance and Sexual Activity   Alcohol use: No    Comment: Wine, 2-3 glasses, every 3 weeks   Drug use: No  Social History Narrative   Lives in [place] with husband.  Systems analyst.  Also in school @ [employer] for [employer].     Social Determinants of Health   Financial Resource Strain:     Difficulty of Paying Living Expenses: Not on file  Food Insecurity:    Worried About Charity fundraiser in the Last Year: Not on file   [employer] of Food in the Last Year: Not on file  Transportation Needs:    Lack of Transportation (Medical): Not on file   Lack of Transportation (Non-Medical): Not on file  Physical Activity:    Days of Exercise per Week: Not on file   Minutes of Exercise per Session: Not on file  Stress:    Feeling of Stress : Not on file  Social Connections:    Frequency of Communication with Friends and Family: Not on  file   Frequency of Social Gatherings with Friends and Family: Not on file   Attends Religious Services: Not on file   Active Member of Clubs or Organizations: Not on file   Attends Archivist Meetings: Not on file   Marital Status: Not on file  Intimate Partner Violence:    Fear of Current or Ex-Partner: Not on file   Emotionally Abused: Not on file   Physically Abused: Not on file   Sexually Abused: Not on file   Current Outpatient Medications on File Prior to Visit  Medication Sig Dispense Refill   apixaban (ELIQUIS) 5 MG TABS tablet TAKE 1 TABLET (5 MG TOTAL) BY MOUTH 2 (TWO) TIMES DAILY. 180 tablet 1   atorvastatin (LIPITOR) 20 MG tablet TAKE 1 TABLET (20 MG TOTAL) BY MOUTH DAILY. 30 tablet 2   Blood Glucose Monitoring Suppl (TRUE METRIX METER) w/Device KIT Use twice daily 1 kit 0   Dulaglutide (TRULICITY) 1.5 YC/1.4GY SOPN INJECT 1.5 MG INTO THE SKIN ONCE A WEEK. 6 mL 2   furosemide (LASIX) 20 MG tablet Take 1 tablet (20 mg total) by mouth as needed for edema. 30 tablet 2   gabapentin (NEURONTIN) 100 MG capsule TAKE 1 CAPSULE (100 MG TOTAL) BY MOUTH AT BEDTIME. 30 capsule 3   glipiZIDE (GLUCOTROL) 10 MG tablet TAKE 1 TABLET (10 MG TOTAL) BY MOUTH 2 (TWO) TIMES DAILY BEFORE A MEAL. 180 tablet 1   glucose blood (TRUE METRIX BLOOD GLUCOSE TEST) test strip Use as instructed. Check blood glucose level by fingerstick twice per day. 200 each  12   lisinopril (ZESTRIL) 2.5 MG tablet TAKE 2 TABLETS (5 MG TOTAL) BY MOUTH DAILY. 180 tablet 1   metFORMIN (GLUCOPHAGE-XR) 500 MG 24 hr tablet TAKE 4 TABLETS (2,000 MG TOTAL) BY MOUTH AT BEDTIME. 360 tablet 1   methimazole (TAPAZOLE) 5 MG tablet Take 0.5 tablets (2.5 mg total) by mouth daily. 45 tablet 0   Methylsulfonylmethane (MSM PO) Take 2 tablets by mouth daily.     metoprolol succinate (TOPROL-XL) 50 MG 24 hr tablet TAKE 1 TABLET (50 MG TOTAL) BY MOUTH DAILY. 30 tablet 2   Multiple Vitamins-Minerals (ALIVE WOMENS 50+) TABS Take 1 tablet by mouth daily.     naproxen sodium (ALEVE) 220 MG tablet Take 220 mg by mouth daily as needed.     TRUEplus Lancets 28G MISC Use twice daily for blood glucose check 200 each 6   No current facility-administered medications on file prior to visit.   No Known Allergies Family History  Problem Relation Age of Onset   Hypertension Mother    Diabetes Mellitus II Sister    Cancer Maternal Grandmother    PE: BP 130/70 (BP Location: Right Arm, Patient Position: Sitting, Cuff Size: Normal)   Pulse 81   Ht $R'5\' 7"'He$  (1.702 m)   Wt (!) 305 lb (138.3 kg)   SpO2 90%   BMI 47.77 kg/m  Wt Readings from Last 3 Encounters:  09/29/21 (!) 305 lb (138.3 kg)  08/23/21 (!) 304 lb (137.9 kg)  02/25/21 297 lb 6.4 oz (134.9 kg)   Constitutional: overweight, in NAD Eyes: PERRLA, EOMI, no exophthalmos, no lid lag, no stare ENT: moist mucous membranes, no thyromegaly felt on palpation, no cervical lymphadenopathy Cardiovascular: RRR, No MRG, no LE edema Respiratory: CTA B Gastrointestinal: abdomen soft, NT, ND, BS+ Musculoskeletal: no deformities, strength intact in all 4 Skin: moist, warm, no rashes Neurological: no tremor with outstretched hands, DTR normal in all 4  ASSESSMENT: 1. Thyrotoxicosis  2.  Goiter  PLAN:  1. Patient with history of thyrotoxicosis with thyrotoxic symptoms including weight loss, heat intolerance, shortness of breath, weakness.   TSI's were elevated pointing towards a diagnosis of Graves' disease.  She was started on methimazole 5 mg 3 times a day and her thyroid test improved afterwards.  She responded well to methimazole so we were able to decrease the dose gradually.  At last visit, TSH was slightly high so I advised her to reduce the methimazole from 5 mg daily to 2.5 mg daily. -She continues on the above dose.  She feels well, without thyrotoxic symptoms now -At last visit, we discussed about other modalities of treatment for Graves' disease to include RAI treatment and surgery.  She does have significant goiter with compressive symptoms and because of this I suggested thyroidectomy.  I referred her to surgery and she met with Dr. Harlow Asa.  After discussion with him, she decided to continue without surgery.  I advised her that this was a suggestion not necessarily for treatment of Graves' disease, which appears to be controlled on methimazole low-dose, but more for her compressive goiter  (Please see below also) -At this visit, I plan to check her TFTs and adjust her methimazole accordingly, however, she just started biotin so we cannot check today.  She is unclear what dose she is taking so I advised her to look at home and let me know.  After this, I will advise her for how long she needs to stay off before we can check thyroid tests. -No signs of active Graves' ophthalmopathy: No blurry vision, double vision, eye pain, chemosis -she continues Toprol XL 50 mg at night, decreased from 75 mg daily (last visit she was also taking a 25 mg dose in the morning).  She did try to reduce the dose at night, but had problems sleeping.  2.  Goiter -This is large and extending in the mediastinum-on the chest x-ray from 12/20/2019, the trachea was slightly deviated to the right -She did not have significant neck compression symptoms in the past, but she did complain at last visit about saliva catching in her throat occasionally and she also  had some cough.  Symptoms improved after starting CPAP and before last visit she switched to BiPAP.  At last visit, however, she complains about shortness of breath and fatigue.  She also had oxygen saturation with walking.  It was difficult to know whether the symptoms were related to thyroid compression or sleep apnea/deconditioning.  However, I felt that her breathing would improve after thyroidectomy and this would also correct her thyrotoxicosis.  This is why I referred her to Dr. Harlow Asa previously. -At this visit, she again complains of shortness of breath and fatigue and I again recommended  thyroidectomy.   -For now, however, she would like to continue to follow this without intervention, but we will also get an ultrasound today and see if her thyroid gland is growing.   Philemon Kingdom, MD PhD [facility] Endocrinology

## 2021-09-29 NOTE — Patient Instructions (Addendum)
Please stop Biotin and come back for labs.  Try to have another Thyroid U/S.  Please continue Methimazole 2.5 mg daily.  Please come back for a follow-up appointment in 6 months.

## 2021-10-01 ENCOUNTER — Other Ambulatory Visit: Payer: Self-pay

## 2021-10-15 ENCOUNTER — Other Ambulatory Visit: Payer: Self-pay

## 2021-10-15 ENCOUNTER — Ambulatory Visit (INDEPENDENT_AMBULATORY_CARE_PROVIDER_SITE_OTHER): Payer: Self-pay | Admitting: Medical

## 2021-10-15 ENCOUNTER — Encounter: Payer: Self-pay | Admitting: Medical

## 2021-10-15 VITALS — BP 144/70 | HR 75 | Ht 67.0 in | Wt 304.0 lb

## 2021-10-15 DIAGNOSIS — E119 Type 2 diabetes mellitus without complications: Secondary | ICD-10-CM

## 2021-10-15 DIAGNOSIS — E785 Hyperlipidemia, unspecified: Secondary | ICD-10-CM

## 2021-10-15 DIAGNOSIS — N939 Abnormal uterine and vaginal bleeding, unspecified: Secondary | ICD-10-CM

## 2021-10-15 DIAGNOSIS — I10 Essential (primary) hypertension: Secondary | ICD-10-CM

## 2021-10-15 DIAGNOSIS — Z79899 Other long term (current) drug therapy: Secondary | ICD-10-CM

## 2021-10-15 DIAGNOSIS — I5032 Chronic diastolic (congestive) heart failure: Secondary | ICD-10-CM

## 2021-10-15 DIAGNOSIS — G4733 Obstructive sleep apnea (adult) (pediatric): Secondary | ICD-10-CM

## 2021-10-15 DIAGNOSIS — I480 Paroxysmal atrial fibrillation: Secondary | ICD-10-CM

## 2021-10-15 LAB — BASIC METABOLIC PANEL
BUN/Creatinine Ratio: 23 (ref 9–23)
BUN: 11 mg/dL (ref 6–24)
CO2: 28 mmol/L (ref 20–29)
Calcium: 9.8 mg/dL (ref 8.7–10.2)
Chloride: 100 mmol/L (ref 96–106)
Creatinine, Ser: 0.48 mg/dL — ABNORMAL LOW (ref 0.57–1.00)
Glucose: 154 mg/dL — ABNORMAL HIGH (ref 70–99)
Potassium: 4.6 mmol/L (ref 3.5–5.2)
Sodium: 141 mmol/L (ref 134–144)
eGFR: 112 mL/min/{1.73_m2} (ref >59)

## 2021-10-15 LAB — CBC
Hematocrit: 41.7 % (ref 34.0–46.6)
Hemoglobin: 13.4 g/dL (ref 11.1–15.9)
MCH: 30.1 pg (ref 26.6–33.0)
MCHC: 32.1 g/dL (ref 31.5–35.7)
MCV: 94 fL (ref 79–97)
Platelets: 244 10*3/uL (ref 150–450)
RBC: 4.45 x10E6/uL (ref 3.77–5.28)
RDW: 12.4 % (ref 11.7–15.4)
WBC: 7.6 10*3/uL (ref 3.4–10.8)

## 2021-10-15 MED ORDER — APIXABAN 5 MG PO TABS
5.0000 mg | ORAL_TABLET | Freq: Two times a day (BID) | ORAL | 3 refills | Status: DC
  Filled 2021-10-15: qty 180, 90d supply, fill #0
  Filled 2022-01-24: qty 60, 30d supply, fill #0

## 2021-10-15 MED ORDER — FUROSEMIDE 20 MG PO TABS
20.0000 mg | ORAL_TABLET | Freq: Every day | ORAL | 3 refills | Status: DC
  Filled 2021-10-15: qty 30, 30d supply, fill #0
  Filled 2021-11-19: qty 30, 30d supply, fill #1
  Filled 2021-12-23: qty 30, 30d supply, fill #0
  Filled 2022-01-23: qty 30, 30d supply, fill #1
  Filled 2022-03-07: qty 30, 30d supply, fill #2
  Filled 2022-04-10 – 2022-04-22 (×2): qty 30, 30d supply, fill #3
  Filled 2022-05-30: qty 90, 90d supply, fill #4
  Filled 2022-08-08: qty 90, 90d supply, fill #5

## 2021-10-15 MED ORDER — METOPROLOL SUCCINATE ER 50 MG PO TB24
50.0000 mg | ORAL_TABLET | Freq: Every day | ORAL | 3 refills | Status: DC
  Filled 2021-10-15: qty 30, 30d supply, fill #0
  Filled 2021-11-19: qty 30, 30d supply, fill #1
  Filled 2021-12-21: qty 30, 30d supply, fill #2
  Filled 2021-12-22: qty 30, 30d supply, fill #0
  Filled 2022-01-23: qty 30, 30d supply, fill #1
  Filled 2022-03-07: qty 30, 30d supply, fill #2
  Filled 2022-04-05: qty 30, 30d supply, fill #3
  Filled 2022-05-12: qty 90, 90d supply, fill #4
  Filled 2022-08-08: qty 90, 90d supply, fill #5

## 2021-10-15 NOTE — Progress Notes (Signed)
Cardiology Office Note   Date:  10/15/2021   ID:  Haley [name], DOB 1966-05-02, MRN [id number]  PCP:  [name], NP  Cardiologist:  [name], MD EP: None  Chief Complaint  Patient presents with   Follow-up    Paroxysmal A. fib and diastolic CHF       History of Present Illness: Haley [name] is a 55 y.o. female with a PMH of paroxysmal atrial fibrillation, chronic diastolic CHF, HTN, DM type II, Graves' disease, OSA on CPAP, and obesity who presents for 58-month follow-up.  She was last evaluated by cardiology and an outpatient visit with Dr. Claiborne Billings 02/2021 at which time she had complaints of DOE though overall this had improved since her previous visit.  He was noted to be compliant with her CPAP.  No medication changes occurred when she was encouraged to increase exercise to promote weight loss.  Her last echocardiogram 12/2019 showed EF 60-65%, moderate LVH no R WMA, normal RV size/function, mild LAE, and no significant valvular dysfunction.  Does not appear that she has undergone any ischemic testing in the past.  She presents today for follow-up.  Her biggest complaint today is lack of weight loss.  She is disappointed that she has not been able to lose weight despite improving her diet and increasing her activity.  She does feel limited by joint pain and shortness of breath.  I suspect the shortness of breath is largely due to her obesity and deconditioning rather than underlying cardiac issue.  She has not had any exertional chest pain.  Did note some "itching" type discomfort in her chest a couple weeks ago which resolved fairly quickly.  She reports occasional lower extremity edema.  She started taking her Lasix every other day as opposed to just as needed and this seems to have improved not only the swelling but also mildly her shortness of breath.  She has concerns that her CPAP mask is not fitting properly.  We will have her chat with Ms. Mariann Laster after her  visit.  She has also noted some vaginal spotting recently.  She has not seen GYN in some time.  She is postmenopausal.  She was encouraged to see her GYN. No complaints of dizziness, lightheadedness, syncope.    Past Medical History:  Diagnosis Date   Essential hypertension    Fibroids    Hyperthyroidism    a. 2010, treated w/ tapazole.   Morbid obesity ([place])    Paroxysmal atrial fibrillation ([place])    a. 03/2009 - in setting of hyperthyroidism and caffeine intake, converted spontaneously;  b. 08/2016 ED visit for recurrent PAF-->successful DCCV in ED.   Snores    Type II diabetes mellitus (HCC)     Past Surgical History:  Procedure Laterality Date   UTERINE FIBROID SURGERY       Current Outpatient Medications  Medication Sig Dispense Refill   atorvastatin (LIPITOR) 20 MG tablet TAKE 1 TABLET (20 MG TOTAL) BY MOUTH DAILY. 30 tablet 2   Blood Glucose Monitoring Suppl (TRUE METRIX METER) w/Device KIT Use twice daily 1 kit 0   Dulaglutide (TRULICITY) 1.5 HQ/4.6NG SOPN INJECT 1.5 MG INTO THE SKIN ONCE A WEEK. 6 mL 2   gabapentin (NEURONTIN) 100 MG capsule TAKE 1 CAPSULE (100 MG TOTAL) BY MOUTH AT BEDTIME. 30 capsule 3   glipiZIDE (GLUCOTROL) 10 MG tablet TAKE 1 TABLET (10 MG TOTAL) BY MOUTH 2 (TWO) TIMES DAILY BEFORE A MEAL. 180 tablet 1  glucose blood (TRUE METRIX BLOOD GLUCOSE TEST) test strip Use as instructed. Check blood glucose level by fingerstick twice per day. 200 each 12   lisinopril (ZESTRIL) 2.5 MG tablet TAKE 2 TABLETS (5 MG TOTAL) BY MOUTH DAILY. 180 tablet 1   metFORMIN (GLUCOPHAGE-XR) 500 MG 24 hr tablet TAKE 4 TABLETS (2,000 MG TOTAL) BY MOUTH AT BEDTIME. 360 tablet 1   methimazole (TAPAZOLE) 5 MG tablet Take 0.5 tablets (2.5 mg total) by mouth daily. 45 tablet 0   Methylsulfonylmethane (MSM PO) Take 2 tablets by mouth daily.     Multiple Vitamins-Minerals (ALIVE WOMENS 50+) TABS Take 1 tablet by mouth daily.     naproxen sodium (ALEVE) 220 MG tablet Take 220 mg by  mouth daily as needed.     TRUEplus Lancets 28G MISC Use twice daily for blood glucose check 200 each 6   apixaban (ELIQUIS) 5 MG TABS tablet Take 1 tablet (5 mg total) by mouth 2 (two) times daily. 60 tablet 3   furosemide (LASIX) 20 MG tablet Take 1 tablet (20 mg total) by mouth daily. 90 tablet 3   metoprolol succinate (TOPROL-XL) 50 MG 24 hr tablet Take 1 tablet (50 mg total) by mouth daily. 90 tablet 3   No current facility-administered medications for this visit.    Allergies:   Patient has no allergy information on record.    Social History:  The patient  reports that she quit smoking about 10 years ago. Her smoking use included cigarettes. She has a 32.00 pack-year smoking history. She has never used smokeless tobacco. She reports that she does not drink alcohol and does not use drugs.   Family History:  The patient's family history includes Cancer in her maternal grandmother; Diabetes Mellitus II in her sister; Hypertension in her mother.    ROS:  Please see the history of present illness.   Otherwise, review of systems are positive for none.   All other systems are reviewed and negative.    PHYSICAL EXAM: VS:  BP (!) 144/70   Pulse 75   Ht $R'5\' 7"'sN$  (1.702 m)   Wt (!) 304 lb (137.9 kg)   SpO2 95%   BMI 47.61 kg/m  , BMI Body mass index is 47.61 kg/m. GEN: Well nourished, well developed, in no acute distress HEENT: Sclera anicteric Neck: no JVD, carotid bruits, or masses Cardiac: RRR; no murmurs, rubs, or gallops, no edema  Respiratory:  clear to auscultation bilaterally, normal work of breathing GI: soft, obese, nontender, nondistended, + BS MS: no deformity or atrophy Skin: warm and dry, no rash Neuro:  Strength and sensation are intact Psych: euthymic mood, full affect   EKG:  EKG is not ordered today.    Recent Labs: 11/24/2020: Hemoglobin 13.7; Platelets 250 05/27/2021: ALT 14; BUN 12; Creatinine, Ser 0.82; Potassium 4.6; Sodium 142; TSH 1.370    Lipid  Panel    Component Value Date/Time   CHOL 131 07/02/2020 1420   TRIG 56 07/02/2020 1420   HDL 53 07/02/2020 1420   CHOLHDL 2.5 07/02/2020 1420   CHOLHDL 3.5 01/25/2019 0850   VLDL 19 01/25/2019 0850   LDLCALC 66 07/02/2020 1420      Wt Readings from Last 3 Encounters:  10/15/21 (!) 304 lb (137.9 kg)  09/29/21 (!) 305 lb (138.3 kg)  08/23/21 (!) 304 lb (137.9 kg)      Other studies Reviewed: Additional studies/ records that were reviewed today include:   Echocardiogram 12/2019: 1. Left ventricular ejection fraction, by  visual estimation, is 60 to  65%. The left ventricle has normal function. There is moderately increased  left ventricular hypertrophy.   2. The left ventricle has no regional wall motion abnormalities.   3. Global right ventricle has normal systolic function.The right  ventricular size is normal. No increase in right ventricular wall  thickness.   4. Left atrial size was mildly dilated.   5. Right atrial size was normal.   6. The mitral valve is normal in structure. Trivial mitral valve  regurgitation. No evidence of mitral stenosis.   7. The tricuspid valve is normal in structure.   8. The aortic valve was not well visualized. Aortic valve regurgitation  is not visualized. Mild to moderate aortic valve sclerosis/calcification  without any evidence of aortic stenosis.   9. The pulmonic valve was normal in structure. Pulmonic valve  regurgitation is not visualized.  10. The inferior vena cava is normal in size with greater than 50%  respiratory variability, suggesting right atrial pressure of 3 mmHg.  11. The interatrial septum was not well visualized.    ASSESSMENT AND PLAN:  1.  Paroxysmal atrial fibrillation: HR 75 today.  Has had some vaginal bleeding recently.  She is postmenopausal. -We will check a CBC today to ensure blood counts are stable -Encouraged GYN evaluation for postmenopausal bleeding -Continue metoprolol for rate control -Continue  Eliquis for stroke Ppx  2.  Chronic diastolic CHF: Has had some worsening DOE over the past 6 months but she thinks this is perhaps improved after increasing her Lasix from as needed to every other day.  Suspect largely her DOE is due to obesity and deconditioning.  That being said she has good kidney function and did have some benefit with increase Lasix -Will further increase Lasix to 20 mg daily -Continue beta-blocker -We will check BMET today to ensure kidney function and electrolytes are stable  3.  HTN: BP 144/70 today.  She does not monitor blood pressures at home.  Encouraged her to keep an eye on blood pressures -BP log given -Patient to monitor blood pressure closely over the next 2 weeks and notify the office if consistently greater than 130/80 -Continue metoprolol succinate, lisinopril, and Lasix -Encourage low-salt diet  4.  HLD: LDL 66 06/2020 -Continue atorvastatin 20 mg daily  5. DM type II: A1c 6.5 to -Continue Trulicity, glipizide and metformin  6.  Graves' disease: TSH WNL 05/2021 -Continue methimazole  7.  OSA: She reports compliance with CPAP -CPAP use -Ms. wanted to help her with her mask following today's visit  8. Morbid obesity: BMI 47.  She is frustrated by her inability to lose weight. -We will place referral to healthy weight and wellness center -To encourage healthy dietary/lifestyle modifications to promote weight loss   Current medicines are reviewed at length with the patient today.  The patient does not have concerns regarding medicines.  The following changes have been made: As above  Labs/ tests ordered today include:   Orders Placed This Encounter  Procedures   Basic metabolic panel   CBC   Amb Ref to Medical Weight Management     Disposition:   FU with Dr. Claiborne Billings in 6 months  Signed, Abigail Butts, PA-C  10/15/2021 10:10 AM

## 2021-10-15 NOTE — Patient Instructions (Signed)
Medication Instructions:  INCREASE Lasix to 20 mg daily  *If you need a refill on your cardiac medications before your next appointment, please call your pharmacy*  Lab Work: Your physician recommends that you return for lab work TODAY:  BMET CBC  If you have labs (blood work) drawn today and your tests are completely normal, you will receive your results only by: [place] (if you have MyChart) OR A paper copy in the mail If you have any lab test that is abnormal or we need to change your treatment, we will call you to review the results.  Testing/Procedures: NONE ordered at this time of appointment   Follow-Up: At [facility], you and your health needs are our priority.  As part of our continuing mission to provide you with exceptional heart care, we have created designated Provider Care Teams.  These Care Teams include your primary Cardiologist (physician) and Advanced Practice Providers (APPs -  Physician Assistants and Nurse Practitioners) who all work together to provide you with the care you need, when you need it.  Your next appointment:   6 month(s)  The format for your next appointment:   In Person  Provider:   [name], MD  or  APP        Other Instructions You have been referred to Medical weight management  Follow up with your Gynecologist about the vaginal bleeding

## 2021-10-18 ENCOUNTER — Other Ambulatory Visit: Payer: Self-pay

## 2021-10-18 ENCOUNTER — Ambulatory Visit: Payer: Self-pay

## 2021-10-18 ENCOUNTER — Ambulatory Visit: Payer: Self-pay | Admitting: Pharmacist

## 2021-10-25 ENCOUNTER — Other Ambulatory Visit: Payer: Self-pay

## 2021-11-01 ENCOUNTER — Other Ambulatory Visit: Payer: Self-pay

## 2021-11-05 ENCOUNTER — Other Ambulatory Visit: Payer: Self-pay

## 2021-11-19 ENCOUNTER — Other Ambulatory Visit: Payer: Self-pay

## 2021-12-22 ENCOUNTER — Other Ambulatory Visit: Payer: Self-pay

## 2021-12-23 ENCOUNTER — Other Ambulatory Visit: Payer: Self-pay

## 2022-01-23 ENCOUNTER — Other Ambulatory Visit: Payer: Self-pay | Admitting: Physician Assistant

## 2022-01-23 DIAGNOSIS — I10 Essential (primary) hypertension: Secondary | ICD-10-CM

## 2022-01-23 DIAGNOSIS — E1169 Type 2 diabetes mellitus with other specified complication: Secondary | ICD-10-CM

## 2022-01-24 ENCOUNTER — Other Ambulatory Visit: Payer: Self-pay

## 2022-01-25 ENCOUNTER — Other Ambulatory Visit: Payer: Self-pay

## 2022-01-25 ENCOUNTER — Other Ambulatory Visit: Payer: Self-pay | Admitting: Pharmacist

## 2022-01-25 DIAGNOSIS — E1169 Type 2 diabetes mellitus with other specified complication: Secondary | ICD-10-CM

## 2022-01-25 DIAGNOSIS — I10 Essential (primary) hypertension: Secondary | ICD-10-CM

## 2022-01-25 MED ORDER — GABAPENTIN 100 MG PO CAPS
ORAL_CAPSULE | Freq: Every day | ORAL | 0 refills | Status: DC
  Filled 2022-01-25 – 2022-02-12 (×2): qty 30, 30d supply, fill #0

## 2022-01-25 MED ORDER — LISINOPRIL 2.5 MG PO TABS
ORAL_TABLET | ORAL | 0 refills | Status: DC
  Filled 2022-01-25 – 2022-02-12 (×2): qty 60, 30d supply, fill #0

## 2022-01-25 NOTE — Telephone Encounter (Signed)
Requested Prescriptions  Pending Prescriptions Disp Refills   lisinopril (ZESTRIL) 2.5 MG tablet 180 tablet 0    Sig: TAKE 2 TABLETS (5 MG TOTAL) BY MOUTH DAILY.     Cardiovascular:  ACE Inhibitors Failed - 01/23/2022 10:34 PM      Failed - Cr in normal range and within 180 days    Creatinine, Ser  Date Value Ref Range Status  10/15/2021 0.48 (L) 0.57 - 1.00 mg/dL Final         Failed - Last BP in normal range    BP Readings from Last 1 Encounters:  10/15/21 (!) 144/70         Passed - K in normal range and within 180 days    Potassium  Date Value Ref Range Status  10/15/2021 4.6 3.5 - 5.2 mmol/L Final         Passed - Patient is not pregnant      Passed - Valid encounter within last 6 months    Recent Outpatient Visits          5 months ago Essential hypertension   [place] [facility], Vernia Buff, NP   8 months ago Graves disease   [place], Levada Dy M, [place]   11 months ago Essential hypertension   [place], [place] W, NP   1 year ago Need for immunization against influenza   [place], RPH-CPP   1 year ago Type 2 diabetes mellitus with other specified complication, without long-term current use of insulin [facility])   [place], [place] W, NP              gabapentin (NEURONTIN) 100 MG capsule 30 capsule 0    Sig: TAKE 1 CAPSULE (100 MG TOTAL) BY MOUTH AT BEDTIME.     Neurology: Anticonvulsants - gabapentin Failed - 01/23/2022 10:34 PM      Failed - Cr in normal range and within 360 days    Creatinine, Ser  Date Value Ref Range Status  10/15/2021 0.48 (L) 0.57 - 1.00 mg/dL Final         Passed - Completed PHQ-2 or PHQ-9 in the last 360 days      Passed - Valid encounter within last 12 months    Recent Outpatient Visits          5 months ago Essential hypertension    [place], Vernia Buff, NP   8 months ago Graves disease   [place], Levada Dy M, [place]   11 months ago Essential hypertension   [place], Vernia Buff, NP   1 year ago Need for immunization against influenza   [place], RPH-CPP   1 year ago Type 2 diabetes mellitus with other specified complication, without long-term current use of insulin [facility])   [place], Vernia Buff, NP              [place] refill.

## 2022-02-01 ENCOUNTER — Other Ambulatory Visit: Payer: Self-pay

## 2022-02-14 ENCOUNTER — Other Ambulatory Visit: Payer: Self-pay

## 2022-02-15 ENCOUNTER — Other Ambulatory Visit: Payer: Self-pay

## 2022-02-15 ENCOUNTER — Ambulatory Visit: Payer: Self-pay

## 2022-02-15 ENCOUNTER — Ambulatory Visit: Payer: Self-pay | Admitting: Physician Assistant

## 2022-02-15 VITALS — BP 135/71 | HR 72 | Resp 20 | Ht 67.0 in | Wt 297.0 lb

## 2022-02-15 DIAGNOSIS — E0590 Thyrotoxicosis, unspecified without thyrotoxic crisis or storm: Secondary | ICD-10-CM

## 2022-02-15 DIAGNOSIS — E1169 Type 2 diabetes mellitus with other specified complication: Secondary | ICD-10-CM

## 2022-02-15 DIAGNOSIS — I10 Essential (primary) hypertension: Secondary | ICD-10-CM

## 2022-02-15 DIAGNOSIS — I5032 Chronic diastolic (congestive) heart failure: Secondary | ICD-10-CM

## 2022-02-15 DIAGNOSIS — Z6841 Body Mass Index (BMI) 40.0 and over, adult: Secondary | ICD-10-CM

## 2022-02-15 DIAGNOSIS — R0602 Shortness of breath: Secondary | ICD-10-CM

## 2022-02-15 DIAGNOSIS — E119 Type 2 diabetes mellitus without complications: Secondary | ICD-10-CM

## 2022-02-15 LAB — POCT GLYCOSYLATED HEMOGLOBIN (HGB A1C): Hemoglobin A1C: 6.8 % — AB (ref 4.0–5.6)

## 2022-02-15 MED ORDER — GLIPIZIDE 10 MG PO TABS
10.0000 mg | ORAL_TABLET | Freq: Two times a day (BID) | ORAL | 1 refills | Status: DC
  Filled 2022-02-15 – 2022-03-07 (×2): qty 60, 30d supply, fill #0
  Filled 2022-04-05: qty 60, 30d supply, fill #1

## 2022-02-15 MED ORDER — METFORMIN HCL ER 500 MG PO TB24
2000.0000 mg | ORAL_TABLET | Freq: Every day | ORAL | 1 refills | Status: DC
  Filled 2022-02-15 – 2022-03-15 (×2): qty 120, 30d supply, fill #0
  Filled 2022-04-10 – 2022-04-22 (×2): qty 120, 30d supply, fill #1

## 2022-02-15 NOTE — Progress Notes (Signed)
? ?Established Patient Office Visit ? ?Subjective:  ?Patient ID: Haley [name], female    DOB: 11-20-1966  Age: 56 y.o. MRN: [id number] ? ?CC:  ?Chief Complaint  ?Patient presents with  ? Shortness of Breath  ? Foot Swelling  ? ? ?HPI ?Haley [name] reports that she has been having shortness of breath, swelling in her ankles and feet.  States that this has been ongoing for the past 6 months.  Also states that the tips of her fingers and toes "feel funny" describes it as tingling. ? ?States that her shortness of breath is exacerbated when she moves, does not complain of shortness of breath on rest.  States that she has been sleeping with several pillows, states that she has also been compliant to her sleep apnea machine. ? ?States that her ankles and feet tend to be swollen at the end of the day, states that in the morning they are much improved.  States that she does wear compression stockings, does endorse working on her feet. ? ?Reports that she does not check her blood glucose readings very often.  On review of her medications, she is only taking the glipizide once daily. ? ?States that she has not been taking the methimazole, states that she did not think she needed to continue taking that medication. ? ? ? ? ?Past Medical History:  ?Diagnosis Date  ? Essential hypertension   ? Fibroids   ? Hyperthyroidism   ? a. 2010, treated w/ tapazole.  ? Morbid obesity ([place])   ? Paroxysmal atrial fibrillation (HCC)   ? a. 03/2009 - in setting of hyperthyroidism and caffeine intake, converted spontaneously;  b. 08/2016 ED visit for recurrent PAF-->successful DCCV in ED.  ? Snores   ? Type II diabetes mellitus ([place])   ? ? ?Past Surgical History:  ?Procedure Laterality Date  ? UTERINE FIBROID SURGERY    ? ? ?Family History  ?Problem Relation Age of Onset  ? Hypertension Mother   ? Diabetes Mellitus II Sister   ? Cancer Maternal Grandmother   ? ? ?Social History  ? ?Socioeconomic History  ? Marital status: Married  ?   Spouse name: Not on file  ? Number of children: Not on file  ? Years of education: Not on file  ? Highest education level: Not on file  ?Occupational History  ? Occupation: cosmetologist  ?Tobacco Use  ? Smoking status: Former  ?  Packs/day: 1.00  ?  Years: 32.00  ?  Pack years: 32.00  ?  Types: Cigarettes  ?  Quit date: 2012  ?  Years since quitting: 11.2  ? Smokeless tobacco: Never  ? Tobacco comments:  ?  patient vapes  ?Vaping Use  ? Vaping Use: Never used  ?Substance and Sexual Activity  ? Alcohol use: No  ?  Comment: occasional drink.  ? Drug use: No  ? Sexual activity: Yes  ?Other Topics Concern  ? Not on file  ?Social History Narrative  ? Lives in [place] with husband.  Systems analyst.  Also in school @ [employer] for [employer].  Does not routinely exercise.  ? ?Social Determinants of Health  ? ?Financial Resource Strain: Not on file  ?Food Insecurity: Not on file  ?Transportation Needs: Not on file  ?Physical Activity: Not on file  ?Stress: Not on file  ?Social Connections: Not on file  ?Intimate Partner Violence: Not on file  ? ? ?Outpatient Medications Prior to Visit  ?Medication Sig Dispense Refill  ?  apixaban (ELIQUIS) 5 MG TABS tablet Take 1 tablet (5 mg total) by mouth 2 (two) times daily. 60 tablet 3  ? atorvastatin (LIPITOR) 20 MG tablet TAKE 1 TABLET (20 MG TOTAL) BY MOUTH DAILY. 30 tablet 2  ? Blood Glucose Monitoring Suppl (TRUE METRIX METER) w/Device KIT Use twice daily 1 kit 0  ? furosemide (LASIX) 20 MG tablet Take 1 tablet (20 mg total) by mouth daily. 90 tablet 3  ? glucose blood (TRUE METRIX BLOOD GLUCOSE TEST) test strip Use as instructed. Check blood glucose level by fingerstick twice per day. 200 each 12  ? lisinopril (ZESTRIL) 2.5 MG tablet TAKE 2 TABLETS (5 MG TOTAL) BY MOUTH DAILY. 60 tablet 0  ? metoprolol succinate (TOPROL-XL) 50 MG 24 hr tablet Take 1 tablet (50 mg total) by mouth daily. 90 tablet 3  ? TRUEplus Lancets 28G MISC Use twice daily for blood glucose check 200 each 6   ? glipiZIDE (GLUCOTROL) 10 MG tablet TAKE 1 TABLET (10 MG TOTAL) BY MOUTH 2 (TWO) TIMES DAILY BEFORE A MEAL. 180 tablet 1  ? Dulaglutide (TRULICITY) 1.5 MW/4.1LK SOPN INJECT 1.5 MG INTO THE SKIN ONCE A WEEK. 6 mL 2  ? gabapentin (NEURONTIN) 100 MG capsule TAKE 1 CAPSULE (100 MG TOTAL) BY MOUTH AT BEDTIME. 30 capsule 0  ? methimazole (TAPAZOLE) 5 MG tablet Take 0.5 tablets (2.5 mg total) by mouth daily. 45 tablet 0  ? Methylsulfonylmethane (MSM PO) Take 2 tablets by mouth daily.    ? Multiple Vitamins-Minerals (ALIVE WOMENS 50+) TABS Take 1 tablet by mouth daily.    ? naproxen sodium (ALEVE) 220 MG tablet Take 220 mg by mouth daily as needed.    ? metFORMIN (GLUCOPHAGE-XR) 500 MG 24 hr tablet TAKE 4 TABLETS (2,000 MG TOTAL) BY MOUTH AT BEDTIME. 360 tablet 1  ? ?No facility-administered medications prior to visit.  ? ? ?Not on File ? ?ROS ?Review of Systems  ?Constitutional:  Negative for chills and fever.  ?HENT: Negative.    ?Eyes: Negative.   ?Respiratory:  Positive for shortness of breath. Negative for cough and wheezing.   ?Cardiovascular:  Positive for leg swelling. Negative for chest pain and palpitations.  ?Gastrointestinal: Negative.   ?Endocrine: Negative.   ?Genitourinary: Negative.   ?Musculoskeletal: Negative.   ?Skin: Negative.   ?Allergic/Immunologic: Negative.   ?Neurological:  Negative for weakness and headaches.  ?Hematological: Negative.   ?Psychiatric/Behavioral: Negative.    ? ?  ?Objective:  ?  ?Physical Exam ?Vitals and nursing note reviewed.  ?Constitutional:   ?   General: She is not in acute distress. ?   Appearance: She is well-developed. She is obese. She is not ill-appearing.  ?HENT:  ?   Head: Normocephalic and atraumatic.  ?   Right Ear: External ear normal.  ?   Left Ear: External ear normal.  ?   Nose: Nose normal.  ?   Mouth/Throat:  ?   Mouth: Mucous membranes are moist.  ?   Pharynx: Oropharynx is clear.  ?Eyes:  ?   Extraocular Movements: Extraocular movements intact.  ?    Conjunctiva/sclera: Conjunctivae normal.  ?   Pupils: Pupils are equal, round, and reactive to light.  ?Cardiovascular:  ?   Rate and Rhythm: Normal rate and regular rhythm.  ?   Pulses: Normal pulses.  ?   Heart sounds: Normal heart sounds.  ?Pulmonary:  ?   Effort: Pulmonary effort is normal.  ?   Breath sounds: Normal breath sounds. No wheezing.  ?  Musculoskeletal:  ?   Cervical back: Normal range of motion and neck supple.  ?   Right lower leg: No edema.  ?   Left lower leg: No edema.  ?Skin: ?   General: Skin is warm and dry.  ?Neurological:  ?   General: No focal deficit present.  ?   Mental Status: She is alert and oriented to person, place, and time.  ?Psychiatric:     ?   Mood and Affect: Mood normal.     ?   Behavior: Behavior normal.     ?   Thought Content: Thought content normal.     ?   Judgment: Judgment normal.  ? ? ?BP 135/71 (BP Location: Left Arm, Patient Position: Sitting, Cuff Size: Large)   Pulse 72   Resp 20   Ht $R'5\' 7"'Fp$  (1.702 m)   Wt 297 lb (134.7 kg)   LMP  (LMP Unknown)   SpO2 93%   BMI 46.52 kg/m?  ?Wt Readings from Last 3 Encounters:  ?02/15/22 297 lb (134.7 kg)  ?10/15/21 (!) 304 lb (137.9 kg)  ?09/29/21 (!) 305 lb (138.3 kg)  ? ? ? ?Health Maintenance Due  ?Topic Date Due  ? OPHTHALMOLOGY EXAM  Never done  ? MAMMOGRAM  Never done  ? FOOT EXAM  02/19/2020  ? COVID-19 Vaccine (3 - Mixed Product risk series) 05/02/2020  ? ? ?There are no preventive care reminders to display for this patient. ? ?Lab Results  ?Component Value Date  ? TSH 1.530 02/15/2022  ? ?Lab Results  ?Component Value Date  ? WBC 7.6 10/15/2021  ? HGB 13.4 10/15/2021  ? HCT 41.7 10/15/2021  ? MCV 94 10/15/2021  ? PLT 244 10/15/2021  ? ?Lab Results  ?Component Value Date  ? NA 144 02/15/2022  ? K 4.4 02/15/2022  ? CO2 23 02/15/2022  ? GLUCOSE 89 02/15/2022  ? BUN 12 02/15/2022  ? CREATININE 0.50 (L) 02/15/2022  ? BILITOT 0.3 05/27/2021  ? ALKPHOS 61 05/27/2021  ? AST 15 05/27/2021  ? ALT 14 05/27/2021  ? PROT 7.3  05/27/2021  ? ALBUMIN 4.7 05/27/2021  ? CALCIUM 10.2 02/15/2022  ? ANIONGAP 9 12/24/2019  ? EGFR 111 02/15/2022  ? ?Lab Results  ?Component Value Date  ? CHOL 131 07/02/2020  ? ?Lab Results  ?Component Value Date  ? HD

## 2022-02-15 NOTE — Patient Instructions (Signed)
We will call you with today's lab results.  I do encourage you to start taking the glipizide twice a day as directed.  Start checking your blood sugar levels in the morning before you have breakfast, keep a written log and have available for all office visits.  I encourage you to start checking your blood pressure and pulse as well. ? ?I encourage you to start doing daily weights, make sure that you are weighing yourself at approximately the same time every day, preferably first thing in the morning. ? ?Kennieth Rad, PA-C ?Physician Assistant ?[place] ?[email or web address] ? ? ?Shortness of Breath, Adult ?Shortness of breath is when a person has trouble breathing or when a person feels like she or he is having trouble breathing in enough air. Shortness of breath could be a sign of a medical problem. ?Follow these instructions at home: ?Pollutants ?Do not use any products that contain nicotine or tobacco. These products include cigarettes, chewing tobacco, and vaping devices, such as e-cigarettes. This also includes cigars and pipes. If you need help quitting, ask your health care provider. ?Avoid things that can irritate your airways, including: ?Smoke. This includes campfire smoke, forest fire smoke, and secondhand smoke from tobacco products. Do not smoke or allow others to smoke in your home. ?Mold. ?Dust. ?Air pollution. ?Chemical fumes. ?Things that can give you an allergic reaction (allergens) if you have allergies. Common allergens include pollen from grasses or trees and animal dander. ?Keep your living space clean and free of mold and dust. ?General instructions ?Pay attention to any changes in your symptoms. ?Take over-the-counter and prescription medicines only as told by your health care provider. This includes oxygen therapy and inhaled medicines. ?Rest as needed. ?Return to your normal activities as told by your health care provider. Ask your  health care provider what activities are safe for you. ?Keep all follow-up visits. This is important. ?Contact a health care provider if: ?Your condition does not improve as soon as expected. ?You have a hard time doing your normal activities, even after you rest. ?You have new symptoms. ?You cannot walk up stairs or exercise the way that you normally do. ?Get help right away if: ?Your shortness of breath gets worse. ?You have shortness of breath when you are resting. ?You feel light-headed or you faint. ?You have a cough that is not controlled with medicines. ?You cough up blood. ?You have pain with breathing. ?You have pain in your chest, arms, shoulders, or abdomen. ?You have a fever. ?These symptoms may be an emergency. Get help right away. Call 911. ?Do not wait to see if the symptoms will go away. ?Do not drive yourself to the hospital. ?Summary ?Shortness of breath is when a person has trouble breathing enough air. It can be a sign of a medical problem. ?Avoid things that irritate your lungs, such as smoking, pollution, mold, and dust. ?Pay attention to changes in your symptoms and contact your health care provider if you have a hard time completing daily activities because of shortness of breath. ?This information is not intended to replace advice given to you by your health care provider. Make sure you discuss any questions you have with your health care provider. ?Document Revised: 07/10/2021 Document Reviewed: 07/10/2021 ?Elsevier Patient Education ? [place]. ? ?

## 2022-02-15 NOTE — Telephone Encounter (Signed)
?  Chief Complaint: Leg swelling, some SOB with exertion, tingling in toes, and hands. ?Symptoms: IBID ?Frequency: past 6 months ?Pertinent Negatives: Patient denies SOB currently. ?Disposition: '[]'$ ED /'[]'$ Urgent Care (no appt availability in office) / '[x]'$ Appointment(In office/virtual)/ '[]'$  [facility] [place] [facility]/ '[]'$ Home Care/ '[]'$ Refused Recommended Disposition /'[]'$ [facility] [place] [facility]/ '[]'$  Follow-up with PCP ?Additional Notes: No availability in office - would prefer in office. Pt will go to mobile unit. Pt has Hx of heart concerns.   ? ? ?Answer Assessment - Initial Assessment Questions ?1. RESPIRATORY STATUS: "Describe your breathing?" (e.g., wheezing, shortness of breath, unable to speak, severe coughing)  ?    Sob ?2. ONSET: "When did this breathing problem begin?"  ?    6 months ?3. PATTERN "Does the difficult breathing come and go, or has it been constant since it started?"  ?    Come and go ?4. SEVERITY: "How bad is your breathing?" (e.g., mild, moderate, severe)  ?  - MILD: No SOB at rest, mild SOB with walking, speaks normally in sentences, can lie down, no retractions, pulse < 100.  ?  - MODERATE: SOB at rest, SOB with minimal exertion and prefers to sit, cannot lie down flat, speaks in phrases, mild retractions, audible wheezing, pulse 100-120.  ?  - SEVERE: Very SOB at rest, speaks in single words, struggling to breathe, sitting hunched forward, retractions, pulse > 120  ?    mild ?5. RECURRENT SYMPTOM: "Have you had difficulty breathing before?" If Yes, ask: "When was the last time?" and "What happened that time?"  ?    Off ?6. CARDIAC HISTORY: "Do you have any history of heart disease?" (e.g., heart attack, angina, bypass surgery, angioplasty)  ?    Yes. HTN ?7. LUNG HISTORY: "Do you have any history of lung disease?"  (e.g., pulmonary embolus, asthma, emphysema) ?    no ?8. CAUSE: "What do you think is causing the breathing problem?"  ?    Getting older -  ?9. OTHER SYMPTOMS: "Do you have any other  symptoms? (e.g., dizziness, runny nose, cough, chest pain, fever) ?    no ?10. O2 SATURATION MONITOR:  "Do you use an oxygen saturation monitor (pulse oximeter) at home?" If Yes, "What is your reading (oxygen level) today?" "What is your usual oxygen saturation reading?" (e.g., 95%) ?      no ?11. PREGNANCY: "Is there any chance you are pregnant?" "When was your last menstrual period?" ?      no ?12. TRAVEL: "Have you traveled out of the country in the last month?" (e.g., travel history, exposures) ?      na ? ?Protocols used: Breathing Difficulty-A-AH ? ?

## 2022-02-15 NOTE — Progress Notes (Signed)
Pt reporting SOB exacerbated by movement. SPO2 81% upon arrival, and up to 93% when resting after coughing and deep breathing. Pt reports that she has eaten today and taken all her medications.  ?

## 2022-02-16 ENCOUNTER — Other Ambulatory Visit: Payer: Self-pay

## 2022-02-16 LAB — BASIC METABOLIC PANEL
BUN/Creatinine Ratio: 24 — ABNORMAL HIGH (ref 9–23)
BUN: 12 mg/dL (ref 6–24)
CO2: 23 mmol/L (ref 20–29)
Calcium: 10.2 mg/dL (ref 8.7–10.2)
Chloride: 100 mmol/L (ref 96–106)
Creatinine, Ser: 0.5 mg/dL — ABNORMAL LOW (ref 0.57–1.00)
Glucose: 89 mg/dL (ref 70–99)
Potassium: 4.4 mmol/L (ref 3.5–5.2)
Sodium: 144 mmol/L (ref 134–144)
eGFR: 111 mL/min/{1.73_m2} (ref >59)

## 2022-02-16 LAB — BRAIN NATRIURETIC PEPTIDE: BNP: 63.9 pg/mL (ref 0.0–100.0)

## 2022-02-16 LAB — THYROID PANEL WITH TSH
Free Thyroxine Index: 1.9 (ref 1.2–4.9)
T3 Uptake Ratio: 30 % (ref 24–39)
T4, Total: 6.2 ug/dL (ref 4.5–12.0)
TSH: 1.53 u[IU]/mL (ref 0.450–4.500)

## 2022-02-17 ENCOUNTER — Telehealth: Payer: Self-pay | Admitting: *Deleted

## 2022-02-17 DIAGNOSIS — I5032 Chronic diastolic (congestive) heart failure: Secondary | ICD-10-CM | POA: Insufficient documentation

## 2022-02-17 NOTE — Telephone Encounter (Signed)
-----   Message from Kennieth Rad, [place] sent at 02/17/2022  3:06 PM EDT ----- ?Please call patient and let her know that her kidney function is within normal limits, she does show some signs of dehydration.  Her marker for heart failure was within normal limits, her thyroid panel was also within normal limits.  She does not need to restart the methimazole at this time, but I do encourage her to keep her follow-up with endocrinology in April. ?

## 2022-02-17 NOTE — Telephone Encounter (Signed)
Patient verified DOB ?Patient is aware of labs being normal except for needing to increase water due to dehydration being noted. Patient is also aware of BNP/TSH being normal and not needing to restart Tapazole at this time. Patient will keep FU with Endo in April. ?

## 2022-02-23 ENCOUNTER — Other Ambulatory Visit: Payer: Self-pay

## 2022-03-07 ENCOUNTER — Other Ambulatory Visit: Payer: Self-pay | Admitting: Nurse Practitioner

## 2022-03-07 ENCOUNTER — Other Ambulatory Visit: Payer: Self-pay | Admitting: Medical

## 2022-03-07 ENCOUNTER — Other Ambulatory Visit: Payer: Self-pay

## 2022-03-07 ENCOUNTER — Other Ambulatory Visit: Payer: Self-pay | Admitting: Family Medicine

## 2022-03-07 DIAGNOSIS — E1169 Type 2 diabetes mellitus with other specified complication: Secondary | ICD-10-CM

## 2022-03-07 DIAGNOSIS — I10 Essential (primary) hypertension: Secondary | ICD-10-CM

## 2022-03-07 DIAGNOSIS — I480 Paroxysmal atrial fibrillation: Secondary | ICD-10-CM

## 2022-03-07 MED ORDER — APIXABAN 5 MG PO TABS
5.0000 mg | ORAL_TABLET | Freq: Two times a day (BID) | ORAL | 5 refills | Status: DC
  Filled 2022-03-07: qty 120, 60d supply, fill #0
  Filled 2022-04-22: qty 60, 30d supply, fill #1
  Filled 2022-05-03 – 2022-05-10 (×2): qty 180, 90d supply, fill #1
  Filled 2022-08-09: qty 60, 30d supply, fill #2

## 2022-03-07 NOTE — Telephone Encounter (Signed)
Prescription refill request for Eliquis received. ?Indication:Afib ?Last office visit:11/22 ?Scr:0.5 ?Age: 56 ?Weight:134.7 kg ? ?Prescription refilled ? ?

## 2022-03-08 ENCOUNTER — Other Ambulatory Visit: Payer: Self-pay

## 2022-03-08 MED ORDER — LISINOPRIL 2.5 MG PO TABS
ORAL_TABLET | ORAL | 1 refills | Status: DC
  Filled 2022-03-08: qty 60, fill #0
  Filled 2022-03-28: qty 60, 30d supply, fill #0
  Filled 2022-04-22: qty 60, 30d supply, fill #1

## 2022-03-08 MED ORDER — GABAPENTIN 100 MG PO CAPS
ORAL_CAPSULE | Freq: Every day | ORAL | 0 refills | Status: DC
  Filled 2022-03-08: qty 30, 30d supply, fill #0

## 2022-03-09 ENCOUNTER — Other Ambulatory Visit: Payer: Self-pay

## 2022-03-15 ENCOUNTER — Other Ambulatory Visit: Payer: Self-pay

## 2022-03-28 ENCOUNTER — Other Ambulatory Visit: Payer: Self-pay

## 2022-03-31 ENCOUNTER — Ambulatory Visit (INDEPENDENT_AMBULATORY_CARE_PROVIDER_SITE_OTHER): Payer: Self-pay | Admitting: Internal Medicine

## 2022-03-31 ENCOUNTER — Encounter: Payer: Self-pay | Admitting: Internal Medicine

## 2022-03-31 VITALS — BP 130/70 | HR 76 | Ht 67.0 in | Wt 294.0 lb

## 2022-03-31 DIAGNOSIS — E0500 Thyrotoxicosis with diffuse goiter without thyrotoxic crisis or storm: Secondary | ICD-10-CM

## 2022-03-31 DIAGNOSIS — E040 Nontoxic diffuse goiter: Secondary | ICD-10-CM

## 2022-03-31 NOTE — Patient Instructions (Signed)
Please stop at the lab. ? ?For now, continue off Methimazole. ? ?You should have an endocrinology follow-up appointment in 1 year.. ? ?

## 2022-03-31 NOTE — Progress Notes (Signed)
Patient ID: Haley [name], female   DOB: [birth year], 56 y.o.   MRN: [id number]  ? ?This visit occurred during the SARS-CoV-2 public health emergency.  Safety protocols were in place, including screening questions prior to the visit, additional usage of staff PPE, and extensive cleaning of exam room while observing appropriate contact time as indicated for disinfecting solutions.  ? ?HPI  ?Haley [name] is a 56 y.o.-year-old female, initially referred by her PCP, [name], NP, returning for follow-up for thyrotoxicosis, diagnosed as Graves' disease at last visit and also large goiter.  Last visit 6 months ago. ? ?Interim history: ?She continues to have shortness of breath and before last visit she also started to have oxygen desaturation.  She continues on BiPAP at night. ?She continues to have knee pain and difficulty walking. ?She also has palpitations related to stress. ?She continues to have significant stress as husband has metastatic lung CA to the brain - had  ChTx, RxTx.  He is now in the hospital. ?He has palpitations related to stress, but no tremors, heat intolerance, or unintended weight loss.  She did lose approximately 10 pounds since our last visit as she is trying to work on her diet. ? ?Reviewed and addended history: ?Patient has a history of thyrotoxicosis since 2010. At that time she was hospitalized with pericarditis and Acute heart failure.  ? ?After diagnosis, she was started on a medication (? MMI) >> she could not tolerate 2/2 extreme fatigue >> changed to another medication (? PTU).  Tests normalized afterwards and she was taken off the medication.   ? ?However, she again started to have symptoms: Weakness, shortness of breath.  The TSH was rechecked and this was low while her free T4 was high.  ? ?She was started on methimazole and Toprol. ? ?Due to insomnia,  we increased her metoprolol to the previous dose, but gave her the higher dose at night. ? ?She is treated with: ?-  Methimazole 5 mg 3 times a day  >> 5 mg twice a day >> 5 mg once a day >> 2.5 mg daily (03/2021) >> stopped 09/2021 ?- Toprol-XL 50 mg daily + 25 mg at night >> 50 mg daily >> 25 mg in a.m. and 50 mg at night >> 25 mg daily ? ?She saw Dr. Harlow Asa in 05/2021 but declined thyroidectomy at that time. ? ?In 09/2021, I ordered another thyroid ultrasound but she did not have this yet. ? ?I reviewed her TFTs: ?Lab Results  ?Component Value Date  ? TSH 1.530 02/15/2022  ? TSH 1.370 05/27/2021  ? TSH 6.08 (H) 03/05/2021  ? TSH 12.500 (H) 11/24/2020  ? TSH 0.007 (L) 08/28/2020  ? TSH 0.73 04/23/2020  ? ?Lab Results  ?Component Value Date  ? FREET4 0.61 03/05/2021  ? FREET4 1.63 08/28/2020  ? FREET4 0.75 04/23/2020  ? FREET4 0.45 (L) 01/24/2020  ? FREET4 1.33 (H) 12/22/2019  ? ?No components found for: FREET3  ? ?Her TSI antibodies were elevated: ?Lab Results  ?Component Value Date  ? TSI 658 (H) 01/24/2020  ?  ?She also has a large goiter with substernal extension. ? ?Thyroid ultrasound (05/08/2019): Enlarged thyroid, no nodules. Inferior ?margin of the left thyroid lobe is difficult to visualize due to the ?substernal extension. ? ?CXR (12/21/2019): Per review of the images, trachea is slightly deviated to the right ? ?CT chest (12/22/2019): Stable, large goiter, extending into the substernal space ? ?At the time of her diagnosis, she complained  of: ?- weight gain, however, she does have weight loss per review of the chart, approximately 7 pounds net in the last month ?- Heat intolerance-chronic ?- Poor sleep ?- Shortness of breath ?- Leg swelling ?- Hair loss ? ?She continues to have shortness of breath, possibly also related to deconditioning  but her sleep and hair loss improved.   ?She has hot flashes and palpitations, which are not new.  She sees cardiology (Dr. Claiborne Billings).  ? ?Pt denies: ?- feeling nodules in neck ?- hoarseness ?- dysphagia ?- choking ? ?Pt does have a FH of thyroid ds.: M aunt and M cousin. No FH of thyroid  cancer. No h/o radiation tx to head or neck. ?No herbal supplements. No Biotin use. No recent steroids use.  ? ?Patient also has CHF, and also severe OSA-started on CPAP in 12/2019 -felt much better afterwards. She had to stop as her hose broke >> changed to on BiPAP. ? ?She was previously exercising consistently, but then only occasionally: Stationary bike, walking, chair aerobics, weightlifting.  ? ?She has DM2, managed by PCP>> HbA1c levels reviewed: ?Lab Results  ?Component Value Date  ? HGBA1C 6.8 (A) 02/15/2022  ? HGBA1C 6.5 (H) 05/27/2021  ? HGBA1C 6.4 (A) 02/17/2021  ? ?ROS: ?+ see HPI ? ?I reviewed pt's medications, allergies, PMH, social hx, family hx, and changes were documented in the history of present illness. Otherwise, unchanged from my initial visit note. ? ?Past Medical History:  ?Diagnosis Date  ? Essential hypertension   ? Fibroids   ? Hyperthyroidism   ? a. 2010, treated w/ tapazole.  ? Morbid obesity ([place])   ? Paroxysmal atrial fibrillation (HCC)   ? a. 03/2009 - in setting of hyperthyroidism and caffeine intake, converted spontaneously;  b. 08/2016 ED visit for recurrent PAF-->successful DCCV in ED.  ? Snores   ? Type II diabetes mellitus ([place])   ? ?Past Surgical History:  ?Procedure Laterality Date  ? UTERINE FIBROID SURGERY    ? ?Social History  ? ?Socioeconomic History  ? Marital status: Married  ?  Spouse name: Not on file  ? Number of children: 0  ? Years of education: Not on file  ? Highest education level: Not on file  ?Occupational History  ? Occupation:  Charity fundraiser, Haematologist  ?Tobacco Use  ? Smoking status: Former Smoker, quit in 2018  ?  Packs/day: 1.00  ?  Years: 32.00  ?  Pack years: 32.00  ?  Quit date: 2012  ?  Years since quitting: 9.1  ? Smokeless tobacco: Never Used  ? Tobacco comment: patient vapes  ?Substance and Sexual Activity  ? Alcohol use: No  ?  Comment: Wine, 2-3 glasses, every 3 weeks  ? Drug use: No  ?Social History Narrative  ? Lives in [place] with husband.   Systems analyst.  Also in school @ [employer] for [employer].    ? ?Social Determinants of Health  ? ?Financial Resource Strain:   ? Difficulty of Paying Living Expenses: Not on file  ?Food Insecurity:   ? Worried About Charity fundraiser in the Last Year: Not on file  ? Ran Out of Food in the Last Year: Not on file  ?Transportation Needs:   ? Film/video editor (Medical): Not on file  ? Lack of Transportation (Non-Medical): Not on file  ?Physical Activity:   ? Days of Exercise per Week: Not on file  ? Minutes of Exercise per Session: Not on file  ?Stress:   ?  Feeling of Stress : Not on file  ?Social Connections:   ? Frequency of Communication with Friends and Family: Not on file  ? Frequency of Social Gatherings with Friends and Family: Not on file  ? Attends Religious Services: Not on file  ? Active Member of Clubs or Organizations: Not on file  ? Attends Archivist Meetings: Not on file  ? Marital Status: Not on file  ?Intimate Partner Violence:   ? Fear of Current or Ex-Partner: Not on file  ? Emotionally Abused: Not on file  ? Physically Abused: Not on file  ? Sexually Abused: Not on file  ? ?Current Outpatient Medications on File Prior to Visit  ?Medication Sig Dispense Refill  ? apixaban (ELIQUIS) 5 MG TABS tablet Take 1 tablet (5 mg total) by mouth 2 (two) times daily. 60 tablet 5  ? atorvastatin (LIPITOR) 20 MG tablet TAKE 1 TABLET (20 MG TOTAL) BY MOUTH DAILY. 30 tablet 2  ? Blood Glucose Monitoring Suppl (TRUE METRIX METER) w/Device KIT Use twice daily 1 kit 0  ? Dulaglutide (TRULICITY) 1.5 DL/8.3RA SOPN INJECT 1.5 MG INTO THE SKIN ONCE A WEEK. 6 mL 2  ? furosemide (LASIX) 20 MG tablet Take 1 tablet (20 mg total) by mouth daily. 90 tablet 3  ? gabapentin (NEURONTIN) 100 MG capsule TAKE 1 CAPSULE (100 MG TOTAL) BY MOUTH AT BEDTIME. 30 capsule 0  ? glipiZIDE (GLUCOTROL) 10 MG tablet Take 1 tablet (10 mg total) by mouth 2 (two) times daily before a meal. 60 tablet 1  ? glucose blood (TRUE  METRIX BLOOD GLUCOSE TEST) test strip Use as instructed. Check blood glucose level by fingerstick twice per day. 200 each 12  ? lisinopril (ZESTRIL) 2.5 MG tablet TAKE 2 TABLETS (5 MG TOTAL) BY MOUTH DAILY

## 2022-04-01 LAB — T3, FREE: T3, Free: 3.3 pg/mL (ref 2.3–4.2)

## 2022-04-01 LAB — T4, FREE: Free T4: 0.91 ng/dL (ref 0.60–1.60)

## 2022-04-01 LAB — TSH: TSH: 1.01 u[IU]/mL (ref 0.35–5.50)

## 2022-04-05 ENCOUNTER — Other Ambulatory Visit: Payer: Self-pay

## 2022-04-06 ENCOUNTER — Other Ambulatory Visit: Payer: Self-pay

## 2022-04-11 ENCOUNTER — Other Ambulatory Visit: Payer: Self-pay

## 2022-04-18 ENCOUNTER — Other Ambulatory Visit: Payer: Self-pay

## 2022-04-22 ENCOUNTER — Other Ambulatory Visit: Payer: Self-pay | Admitting: Physician Assistant

## 2022-04-22 ENCOUNTER — Other Ambulatory Visit: Payer: Self-pay | Admitting: Nurse Practitioner

## 2022-04-22 ENCOUNTER — Other Ambulatory Visit: Payer: Self-pay

## 2022-04-22 DIAGNOSIS — E1169 Type 2 diabetes mellitus with other specified complication: Secondary | ICD-10-CM

## 2022-04-22 MED ORDER — TRULICITY 1.5 MG/0.5ML ~~LOC~~ SOAJ
1.5000 mg | SUBCUTANEOUS | 0 refills | Status: DC
  Filled 2022-04-22 – 2022-05-12 (×2): qty 2, 28d supply, fill #0

## 2022-04-22 MED ORDER — GLIPIZIDE 10 MG PO TABS
10.0000 mg | ORAL_TABLET | Freq: Two times a day (BID) | ORAL | 1 refills | Status: DC
  Filled 2022-04-22 – 2022-05-08 (×2): qty 60, 30d supply, fill #0
  Filled 2022-06-14: qty 60, 30d supply, fill #1

## 2022-04-22 NOTE — Telephone Encounter (Signed)
Requested Prescriptions  Pending Prescriptions Disp Refills  . glipiZIDE (GLUCOTROL) 10 MG tablet 60 tablet 1    Sig: Take 1 tablet (10 mg total) by mouth 2 (two) times daily before a meal.     Endocrinology:  Diabetes - Sulfonylureas Failed - 04/22/2022  1:06 PM      Failed - Cr in normal range and within 360 days    Creatinine, Ser  Date Value Ref Range Status  02/15/2022 0.50 (L) 0.57 - 1.00 mg/dL Final         Failed - Valid encounter within last 6 months    Recent Outpatient Visits          8 months ago Essential hypertension   [place], Vernia Buff, NP   11 months ago Graves disease   [place], [place], [place]   1 year ago Essential hypertension   [place], Vernia Buff, NP   1 year ago Need for immunization against influenza   [place], RPH-CPP   1 year ago Type 2 diabetes mellitus with other specified complication, without long-term current use of insulin [facility])   [place], Vernia Buff, NP      Future Appointments            In 1 month [name], NP [place] - HBA1C is between 0 and 7.9 and within 180 days    Hemoglobin A1C  Date Value Ref Range Status  02/15/2022 6.8 (A) 4.0 - 5.6 % Final   Hgb A1c MFr Bld  Date Value Ref Range Status  05/27/2021 6.5 (H) 4.8 - 5.6 % Final    Comment:             Prediabetes: 5.7 - 6.4          Diabetes: >6.4          Glycemic control for adults with diabetes: <7.0

## 2022-04-23 MED ORDER — GABAPENTIN 100 MG PO CAPS
ORAL_CAPSULE | Freq: Every day | ORAL | 0 refills | Status: DC
  Filled 2022-04-23 – 2022-05-30 (×2): qty 30, 30d supply, fill #0

## 2022-04-25 ENCOUNTER — Other Ambulatory Visit: Payer: Self-pay

## 2022-04-27 ENCOUNTER — Other Ambulatory Visit: Payer: Self-pay

## 2022-04-28 ENCOUNTER — Other Ambulatory Visit: Payer: Self-pay

## 2022-04-29 ENCOUNTER — Other Ambulatory Visit: Payer: Self-pay

## 2022-05-03 ENCOUNTER — Other Ambulatory Visit: Payer: Self-pay

## 2022-05-09 ENCOUNTER — Other Ambulatory Visit: Payer: Self-pay

## 2022-05-10 ENCOUNTER — Other Ambulatory Visit: Payer: Self-pay

## 2022-05-12 ENCOUNTER — Other Ambulatory Visit: Payer: Self-pay | Admitting: Nurse Practitioner

## 2022-05-12 ENCOUNTER — Other Ambulatory Visit: Payer: Self-pay | Admitting: Family Medicine

## 2022-05-12 ENCOUNTER — Other Ambulatory Visit: Payer: Self-pay

## 2022-05-12 DIAGNOSIS — E1169 Type 2 diabetes mellitus with other specified complication: Secondary | ICD-10-CM

## 2022-05-12 DIAGNOSIS — I10 Essential (primary) hypertension: Secondary | ICD-10-CM

## 2022-05-12 MED ORDER — METFORMIN HCL ER 500 MG PO TB24
2000.0000 mg | ORAL_TABLET | Freq: Every day | ORAL | 0 refills | Status: DC
  Filled 2022-05-12 – 2022-05-30 (×2): qty 120, 30d supply, fill #0

## 2022-05-12 MED ORDER — LISINOPRIL 2.5 MG PO TABS
ORAL_TABLET | ORAL | 0 refills | Status: DC
  Filled 2022-05-12: qty 60, fill #0
  Filled 2022-06-16: qty 60, 30d supply, fill #0

## 2022-05-12 NOTE — Telephone Encounter (Signed)
Requested medication (s) are due for refill today: yes  Requested medication (s) are on the active medication list: yes  Last refill:  02/15/22  Future visit scheduled: yes  Notes to clinic:  Unable to refill per protocol, no protocol attached, routing for approval.     Requested Prescriptions  Pending Prescriptions Disp Refills   metFORMIN (GLUCOPHAGE-XR) 500 MG 24 hr tablet 120 tablet 1    Sig: Take 4 tablets (2,000 mg total) by mouth at bedtime.     There is no refill protocol information for this order

## 2022-05-12 NOTE — Telephone Encounter (Signed)
Requested Prescriptions  Pending Prescriptions Disp Refills  . lisinopril (ZESTRIL) 2.5 MG tablet 60 tablet 0    Sig: TAKE 2 TABLETS (5 MG TOTAL) BY MOUTH DAILY.     Cardiovascular:  ACE Inhibitors Failed - 05/12/2022 10:10 AM      Failed - Cr in normal range and within 180 days    Creatinine, Ser  Date Value Ref Range Status  02/15/2022 0.50 (L) 0.57 - 1.00 mg/dL Final         Failed - Valid encounter within last 6 months    Recent Outpatient Visits          8 months ago Essential hypertension   [place], Haley Buff, NP   11 months ago Graves disease   [place], [place], [place]   1 year ago Essential hypertension   [place], Haley Buff, NP   1 year ago Need for immunization against influenza   [place], RPH-CPP   1 year ago Type 2 diabetes mellitus with other specified complication, without long-term current use of insulin [facility])   [place], Haley Buff, NP      Future Appointments            In 1 week [name], NP [place] - K in normal range and within 180 days    Potassium  Date Value Ref Range Status  02/15/2022 4.4 3.5 - 5.2 mmol/L Final         Passed - Patient is not pregnant      Passed - Last BP in normal range    BP Readings from Last 1 Encounters:  03/31/22 130/70

## 2022-05-23 ENCOUNTER — Ambulatory Visit: Payer: Self-pay | Attending: Nurse Practitioner | Admitting: Nurse Practitioner

## 2022-05-23 ENCOUNTER — Encounter: Payer: Self-pay | Admitting: Nurse Practitioner

## 2022-05-23 ENCOUNTER — Other Ambulatory Visit: Payer: Self-pay

## 2022-05-23 VITALS — BP 120/77 | HR 76 | Temp 98.9°F | Resp 16 | Ht 67.0 in | Wt 290.0 lb

## 2022-05-23 DIAGNOSIS — G8929 Other chronic pain: Secondary | ICD-10-CM

## 2022-05-23 DIAGNOSIS — E785 Hyperlipidemia, unspecified: Secondary | ICD-10-CM

## 2022-05-23 DIAGNOSIS — L729 Follicular cyst of the skin and subcutaneous tissue, unspecified: Secondary | ICD-10-CM

## 2022-05-23 DIAGNOSIS — M25561 Pain in right knee: Secondary | ICD-10-CM

## 2022-05-23 DIAGNOSIS — M25562 Pain in left knee: Secondary | ICD-10-CM

## 2022-05-23 DIAGNOSIS — E1169 Type 2 diabetes mellitus with other specified complication: Secondary | ICD-10-CM

## 2022-05-23 MED ORDER — NAPROXEN 500 MG PO TABS
500.0000 mg | ORAL_TABLET | Freq: Two times a day (BID) | ORAL | 1 refills | Status: DC | PRN
  Filled 2022-05-23: qty 60, 30d supply, fill #0

## 2022-05-23 NOTE — Progress Notes (Signed)
Assessment & Plan:  Haley [name] was seen today for cyst and medication refill.  Diagnoses and all orders for this visit:  Chronic pain of both knees -     naproxen (NAPROSYN) 500 MG tablet; Take 1 tablet (500 mg total) by mouth 2 (two) times daily as needed.  Type 2 diabetes mellitus with other specified complication, without long-term current use of insulin (HCC) -     Hemoglobin A1c -     CMP14+EGFR  Dyslipidemia, goal LDL below 70 -     Lipid panel    Patient has been counseled on age-appropriate routine health concerns for screening and prevention. These are reviewed and up-to-date. Referrals have been placed accordingly. Immunizations are up-to-date or declined.    Subjective:   Chief Complaint  Patient presents with   Cyst   Medication Refill   HPI Haley [name] 56 y.o. female presents to office today with concerns of ganglion cyst on the dorsum of the right hand and subcutaneous cyst of the lower left calf   She has a past medical history of Essential hypertension, Fibroids, Hyperthyroidism, Morbid obesity, Paroxysmal atrial fibrillation (Followed by Cardiology), Type 2 DM, morbid obesity, Taking metoprolol for PAH and tachycardic palpitations.    States she has been feeling a whirlwind of emotions over the past few weeks. Her mother passed one month ago and husband is now on Hospice with brain, lung and bone CA.   SKIN Notes tenderness and ganglion cyst on the back of the right hand. Pain is elicited when she flexes the hand backwards and with palpation of the area.  She also has a soft tissue mass on the lower left calf that is movable and non tender. No signs of infection.  Patient was urged to apply for the financial assistance program.  They were instructed to inquire at the front desk about the application process for the [place] discount, orange card or other financial assistance.      Knee Pain She has chronic bilateral knee pain. Unrelated to injury or  trauma. BMI 45. Takes tylenol arthritis for pain. She is aware of the interaction between naproxen and eliquis and knows to take naproxen sparingly.   Review of Systems  Constitutional:  Negative for fever, malaise/fatigue and weight loss.  HENT: Negative.  Negative for nosebleeds.   Eyes: Negative.  Negative for blurred vision, double vision and photophobia.  Respiratory: Negative.  Negative for cough and shortness of breath.   Cardiovascular: Negative.  Negative for chest pain, palpitations and leg swelling.  Gastrointestinal: Negative.  Negative for heartburn, nausea and vomiting.  Musculoskeletal:  Positive for joint pain. Negative for myalgias.  Skin:        SEE HPI  Neurological: Negative.  Negative for dizziness, focal weakness, seizures and headaches.  Psychiatric/Behavioral: Negative.  Negative for suicidal ideas.     Past Medical History:  Diagnosis Date   Essential hypertension    Fibroids    Hyperthyroidism    a. 2010, treated w/ tapazole.   Morbid obesity ([place])    Paroxysmal atrial fibrillation ([place])    a. 03/2009 - in setting of hyperthyroidism and caffeine intake, converted spontaneously;  b. 08/2016 ED visit for recurrent PAF-->successful DCCV in ED.   Snores    Type II diabetes mellitus (HCC)     Past Surgical History:  Procedure Laterality Date   UTERINE FIBROID SURGERY      Family History  Problem Relation Age of Onset   Hypertension Mother  Diabetes Mellitus II Sister    Cancer Maternal Grandmother     Social History Reviewed with no changes to be made today.   Outpatient Medications Prior to Visit  Medication Sig Dispense Refill   apixaban (ELIQUIS) 5 MG TABS tablet Take 1 tablet (5 mg total) by mouth 2 (two) times daily. 60 tablet 5   atorvastatin (LIPITOR) 20 MG tablet TAKE 1 TABLET (20 MG TOTAL) BY MOUTH DAILY. 30 tablet 2   Blood Glucose Monitoring Suppl (TRUE METRIX METER) w/Device KIT Use twice daily 1 kit 0   Dulaglutide (TRULICITY) 1.5  ER/1.5QM SOPN INJECT 1.5 MG INTO THE SKIN ONCE A WEEK. 2 mL 0   furosemide (LASIX) 20 MG tablet Take 1 tablet (20 mg total) by mouth daily. 90 tablet 3   gabapentin (NEURONTIN) 100 MG capsule TAKE 1 CAPSULE (100 MG TOTAL) BY MOUTH AT BEDTIME. 30 capsule 0   glipiZIDE (GLUCOTROL) 10 MG tablet Take 1 tablet (10 mg total) by mouth 2 (two) times daily before a meal. 60 tablet 1   glucose blood (TRUE METRIX BLOOD GLUCOSE TEST) test strip Use as instructed. Check blood glucose level by fingerstick twice per day. 200 each 12   lisinopril (ZESTRIL) 2.5 MG tablet TAKE 2 TABLETS (5 MG TOTAL) BY MOUTH DAILY. 60 tablet 0   metFORMIN (GLUCOPHAGE-XR) 500 MG 24 hr tablet Take 4 tablets (2,000 mg total) by mouth at bedtime. 120 tablet 0   methimazole (TAPAZOLE) 5 MG tablet Take 0.5 tablets (2.5 mg total) by mouth daily. (Patient not taking: Reported on 05/23/2022) 45 tablet 0   Methylsulfonylmethane (MSM PO) Take 2 tablets by mouth daily.     metoprolol succinate (TOPROL-XL) 50 MG 24 hr tablet Take 1 tablet (50 mg total) by mouth daily. 90 tablet 3   Multiple Vitamins-Minerals (ALIVE WOMENS 50+) TABS Take 1 tablet by mouth daily.     naproxen sodium (ALEVE) 220 MG tablet Take 220 mg by mouth daily as needed. (Patient not taking: Reported on 05/23/2022)     TRUEplus Lancets 28G MISC Use twice daily for blood glucose check 200 each 6   No facility-administered medications prior to visit.    No Known Allergies     Objective:    BP 120/77 (BP Location: Left Arm, Patient Position: Sitting, Cuff Size: Large)   Pulse 76   Temp 98.9 F (37.2 C) (Oral)   Resp 16   Ht 5' 7" (1.702 m)   Wt 290 lb (131.5 kg)   SpO2 90%   BMI 45.42 kg/m  Wt Readings from Last 3 Encounters:  05/23/22 290 lb (131.5 kg)  03/31/22 294 lb (133.4 kg)  02/15/22 297 lb (134.7 kg)    Physical Exam Vitals and nursing note reviewed.  Constitutional:      Appearance: She is well-developed.  HENT:     Head: Normocephalic and  atraumatic.  Cardiovascular:     Rate and Rhythm: Normal rate and regular rhythm.     Heart sounds: Normal heart sounds. No murmur heard.    No friction rub. No gallop.  Pulmonary:     Effort: Pulmonary effort is normal. No tachypnea or respiratory distress.     Breath sounds: Normal breath sounds. No decreased breath sounds, wheezing, rhonchi or rales.  Chest:     Chest wall: No tenderness.  Abdominal:     General: Bowel sounds are normal.     Palpations: Abdomen is soft.  Musculoskeletal:        General: Normal range  of motion.     Cervical back: Normal range of motion.  Skin:    General: Skin is warm and dry.       Neurological:     Mental Status: She is alert and oriented to person, place, and time.     Coordination: Coordination normal.  Psychiatric:        Behavior: Behavior normal. Behavior is cooperative.        Thought Content: Thought content normal.        Judgment: Judgment normal.          Patient has been counseled extensively about nutrition and exercise as well as the importance of adherence with medications and regular follow-up. The patient was given clear instructions to go to ER or return to medical center if symptoms don't improve, worsen or new problems develop. The patient verbalized understanding.   Follow-up: No follow-ups on file.   [name], FNP-BC [facility] [place], [place]   05/23/2022, 2:40 PM

## 2022-05-23 NOTE — Progress Notes (Signed)
Concerns with a knots in places on body: right wrist and  LLE  Refill naproxen

## 2022-05-24 LAB — LIPID PANEL
Chol/HDL Ratio: 3 ratio (ref 0.0–4.4)
Cholesterol, Total: 175 mg/dL (ref 100–199)
HDL: 58 mg/dL (ref >39)
LDL Chol Calc (NIH): 103 mg/dL — ABNORMAL HIGH (ref 0–99)
Triglycerides: 75 mg/dL (ref 0–149)
VLDL Cholesterol Cal: 14 mg/dL (ref 5–40)

## 2022-05-24 LAB — CMP14+EGFR
ALT: 15 IU/L (ref 0–32)
AST: 16 IU/L (ref 0–40)
Albumin/Globulin Ratio: 1.5 (ref 1.2–2.2)
Albumin: 4.8 g/dL (ref 3.8–4.9)
Alkaline Phosphatase: 60 IU/L (ref 44–121)
BUN/Creatinine Ratio: 22 (ref 9–23)
BUN: 13 mg/dL (ref 6–24)
Bilirubin Total: 0.3 mg/dL (ref 0.0–1.2)
CO2: 28 mmol/L (ref 20–29)
Calcium: 9.9 mg/dL (ref 8.7–10.2)
Chloride: 103 mmol/L (ref 96–106)
Creatinine, Ser: 0.58 mg/dL (ref 0.57–1.00)
Globulin, Total: 3.1 g/dL (ref 1.5–4.5)
Glucose: 80 mg/dL (ref 70–99)
Potassium: 4.1 mmol/L (ref 3.5–5.2)
Sodium: 143 mmol/L (ref 134–144)
Total Protein: 7.9 g/dL (ref 6.0–8.5)
eGFR: 107 mL/min/{1.73_m2} (ref >59)

## 2022-05-24 LAB — HEMOGLOBIN A1C
Est. average glucose Bld gHb Est-mCnc: 140 mg/dL
Hgb A1c MFr Bld: 6.5 % — ABNORMAL HIGH (ref 4.8–5.6)

## 2022-05-25 ENCOUNTER — Other Ambulatory Visit: Payer: Self-pay

## 2022-05-30 ENCOUNTER — Other Ambulatory Visit: Payer: Self-pay

## 2022-06-14 ENCOUNTER — Other Ambulatory Visit: Payer: Self-pay | Admitting: Nurse Practitioner

## 2022-06-14 DIAGNOSIS — E1169 Type 2 diabetes mellitus with other specified complication: Secondary | ICD-10-CM

## 2022-06-15 ENCOUNTER — Other Ambulatory Visit: Payer: Self-pay

## 2022-06-15 MED ORDER — GABAPENTIN 100 MG PO CAPS
ORAL_CAPSULE | Freq: Every day | ORAL | 0 refills | Status: DC
  Filled 2022-06-15: qty 30, fill #0
  Filled 2022-06-30: qty 30, 30d supply, fill #0

## 2022-06-16 ENCOUNTER — Ambulatory Visit: Payer: Self-pay | Admitting: *Deleted

## 2022-06-16 ENCOUNTER — Other Ambulatory Visit: Payer: Self-pay

## 2022-06-16 ENCOUNTER — Ambulatory Visit: Payer: Self-pay

## 2022-06-16 NOTE — Telephone Encounter (Signed)
  Chief Complaint: Insomnia. Symptoms: Difficulty falling asleep and staying asleep. HAs tried multiple OTC meds, ineffective. Frequency: "Since 06/13/2023 when my mother and husband died" Pertinent Negatives: Patient denies  Disposition: '[]'$ ED /'[]'$ Urgent Care (no appt availability in office) / '[]'$ Appointment(In office/virtual)/ '[]'$  [facility]/ '[]'$ Home Care/ '[]'$ Refused Recommended Disposition /'[]'$ [facility]/ '[x]'$  Follow-up with PCP Additional Notes: Pt declines appt "I was just there 05/23/22"  Requesting Ambien be called in, states was on med before. If appropriate [facility] Please advise Reason for Disposition  Insomnia is an ongoing problem (> 2 weeks)  Answer Assessment - Initial Assessment Questions 1. DESCRIPTION: "Tell me about your sleeping problem."      Trouble going to sleep and staying asleep 2. ONSET: "How long have you been having trouble sleeping?" (e.g., days, weeks, months)     06/13/2023, husband and mother died 3. RECURRENT: "Have you had sleeping problems before?"  If Yes, ask: "What happened that time?" "What helped your sleeping problem go away in the past?"      Always had issue, got better, OTC meds not happen. 4. STRESS: "Is there anything in your life that is making you feel stressed or tense?"     Death of family 5. PAIN: "Do you have any pain that is keeping you awake?" (e.g., back pain, headache, abdominal pain)     No 6. CAFFEINE ABUSE: "Do you drink caffeinated beverages, and how much each day?" (e.g., coffee, tea, colas)     No 7. ALCOHOL USE OR SUBSTANCE USE (DRUG USE): "Do you drink alcohol or use any illegal drugs?"     No 8. OTHER SYMPTOMS: "Do you have any other symptoms?"  (e.g., difficulty breathing)     No  Protocols used: Insomnia-A-AH

## 2022-06-16 NOTE — Telephone Encounter (Signed)
VM was left informing patient to return phone call to set a virtual visit with Ms.Fleming to discuss sleep concerns.

## 2022-06-16 NOTE — Telephone Encounter (Signed)
Pt called in, when transferred from Agent, pt never answered agent or nurse. I attempted to speak for 2 mins before disconnecting call.

## 2022-06-16 NOTE — Telephone Encounter (Signed)
Patient called, left VM to return the call to the office to discuss symptoms with a nurse.   Summary: trouble sleeping   Patient is having trouble sleeping, and is asking for a prescription for Ambien. She didn't want to make another appt since she was in last month.

## 2022-06-16 NOTE — Telephone Encounter (Signed)
2nd attempt, pt called, LVMTCB to discuss symptoms.

## 2022-06-21 ENCOUNTER — Telehealth (HOSPITAL_BASED_OUTPATIENT_CLINIC_OR_DEPARTMENT_OTHER): Payer: Self-pay | Admitting: Nurse Practitioner

## 2022-06-21 ENCOUNTER — Encounter: Payer: Self-pay | Admitting: Nurse Practitioner

## 2022-06-21 ENCOUNTER — Other Ambulatory Visit: Payer: Self-pay

## 2022-06-21 DIAGNOSIS — E1169 Type 2 diabetes mellitus with other specified complication: Secondary | ICD-10-CM

## 2022-06-21 DIAGNOSIS — F4321 Adjustment disorder with depressed mood: Secondary | ICD-10-CM

## 2022-06-21 DIAGNOSIS — F5105 Insomnia due to other mental disorder: Secondary | ICD-10-CM

## 2022-06-21 MED ORDER — METFORMIN HCL ER 500 MG PO TB24
2000.0000 mg | ORAL_TABLET | Freq: Every day | ORAL | 1 refills | Status: DC
  Filled 2022-06-21: qty 120, 30d supply, fill #0
  Filled 2022-08-08: qty 120, 30d supply, fill #1
  Filled 2022-10-03 – 2022-10-11 (×2): qty 120, 30d supply, fill #2
  Filled 2022-11-09: qty 120, 30d supply, fill #3
  Filled 2022-12-15: qty 120, 30d supply, fill #4
  Filled 2023-01-15: qty 120, 30d supply, fill #5

## 2022-06-21 MED ORDER — TRAZODONE HCL 150 MG PO TABS
75.0000 mg | ORAL_TABLET | Freq: Every day | ORAL | 1 refills | Status: DC
  Filled 2022-06-21: qty 30, 30d supply, fill #0
  Filled 2022-08-09: qty 30, 30d supply, fill #1
  Filled 2022-10-04 – 2022-10-11 (×2): qty 30, 30d supply, fill #2
  Filled 2023-01-24: qty 30, 30d supply, fill #3
  Filled 2023-02-17: qty 30, 30d supply, fill #4
  Filled 2023-04-21 (×2): qty 30, 30d supply, fill #5

## 2022-06-21 NOTE — Progress Notes (Signed)
Virtual Visit via Telephone Note  I discussed the limitations, risks, security and privacy concerns of performing an evaluation and management service by telephone and the availability of in person appointments. I also discussed with the patient that there may be a patient responsible charge related to this service. The patient expressed understanding and agreed to proceed.    I connected with Haley [name] on 06/21/22  at  10:50 AM EDT  EDT by telephone and verified that I am speaking with the correct person using two identifiers.  Location of Patient: Private Residence   Location of Provider: [facility] [facility] [place] participating in Telemedicine visit: Geryl Rankins FNP-BC [name]    History of Present Illness: Telemedicine visit for: Insomnia  Notes difficulty falling asleep and staying asleep since her mother and husband both passed recently. She does not drink caffeine in the evenings. Has a history of OSA and is compliant with BIPAP. Usually falls asleep around 1130 pm and wakes up around 4 am. It takes her up to an hour to return to sleep and then she has to get up for work. She has tried several OTC sleep aids with no success. Was prescribed ambien in the past and this was effective however at this time we will try her on trazodone instead.   Past Medical History:  Diagnosis Date   Essential hypertension    Fibroids    Hyperthyroidism    a. 2010, treated w/ tapazole.   Morbid obesity ([place])    Paroxysmal atrial fibrillation ([place])    a. 03/2009 - in setting of hyperthyroidism and caffeine intake, converted spontaneously;  b. 08/2016 ED visit for recurrent PAF-->successful DCCV in ED.   Snores    Type II diabetes mellitus (HCC)     Past Surgical History:  Procedure Laterality Date   UTERINE FIBROID SURGERY      Family History  Problem Relation Age of Onset   Hypertension Mother    Diabetes Mellitus II Sister    Cancer  Maternal Grandmother     Social History   Socioeconomic History   Marital status: Married    Spouse name: Not on file   Number of children: Not on file   Years of education: Not on file   Highest education level: Not on file  Occupational History   Occupation: cosmetologist  Tobacco Use   Smoking status: Former    Packs/day: 1.00    Years: 32.00    Total pack years: 32.00    Types: Cigarettes    Quit date: 2012    Years since quitting: 11.5   Smokeless tobacco: Never   Tobacco comments:    patient vapes  Vaping Use   Vaping Use: Never used  Substance and Sexual Activity   Alcohol use: No    Comment: occasional drink.   Drug use: No   Sexual activity: Yes  Other Topics Concern   Not on file  Social History Narrative   Lives in [place] with husband.  Systems analyst.  Also in school @ [employer] for [place].  Does not routinely exercise.   Social Determinants of Health   Financial Resource Strain: Not on file  Food Insecurity: Not on file  Transportation Needs: Not on file  Physical Activity: Not on file  Stress: Not on file  Social Connections: Not on file     Observations/Objective: Awake, alert and oriented x 3   Review of Systems  Constitutional:  Negative for  fever, malaise/fatigue and weight loss.  HENT: Negative.  Negative for nosebleeds.   Eyes: Negative.  Negative for blurred vision, double vision and photophobia.  Respiratory: Negative.  Negative for cough and shortness of breath.   Cardiovascular: Negative.  Negative for chest pain, palpitations and leg swelling.  Gastrointestinal: Negative.  Negative for heartburn, nausea and vomiting.  Musculoskeletal: Negative.  Negative for myalgias.  Neurological: Negative.  Negative for dizziness, focal weakness, seizures and headaches.  Psychiatric/Behavioral:  Positive for depression. Negative for suicidal ideas. The patient has insomnia.     Assessment and Plan: Diagnoses and all orders for this  visit:  Insomnia secondary to situational depression -     traZODone (DESYREL) 150 MG tablet; Take 0.5-1 tablets (75-150 mg total) by mouth at bedtime.  Type 2 diabetes mellitus with other specified complication, without long-term current use of insulin (HCC) -     metFORMIN (GLUCOPHAGE-XR) 500 MG 24 hr tablet; Take 4 tablets (2,000 mg total) by mouth at bedtime.     Follow Up Instructions Return if symptoms worsen or fail to improve.     I discussed the assessment and treatment plan with the patient. The patient was provided an opportunity to ask questions and all were answered. The patient agreed with the plan and demonstrated an understanding of the instructions.   The patient was advised to call back or seek an in-person evaluation if the symptoms worsen or if the condition fails to improve as anticipated.  I provided 11 minutes of non-face-to-face time during this encounter including median intraservice time, reviewing previous notes, labs, imaging, medications and explaining diagnosis and management.  [name], FNP-BC

## 2022-06-22 ENCOUNTER — Other Ambulatory Visit: Payer: Self-pay

## 2022-06-24 ENCOUNTER — Ambulatory Visit: Payer: Self-pay | Admitting: *Deleted

## 2022-06-24 NOTE — Telephone Encounter (Signed)
Summary: medication question   Pt called and asked to speak with Haley [name] about a medication / she stated she doesn't know if there is an issue and stated no reactions/ please advise / pt gave minimal information      Pt asking if should stick with 1/2 even thought did not sleep til late at night. I encouraged her to try 1/2 pill again but take earlier in evening at least 2 hours prior to bedtime.After trying that if she wants to increase to 1 tablet she can. The rx was for 1/2-1 tablet. Reason for Disposition  General information question, no triage required and triager able to answer question  Answer Assessment - Initial Assessment Questions 1. REASON FOR CALL or QUESTION: "What is your reason for calling today?" or "How can I best help you?" or "What question do you have that I can help answer?"     Pt states only took 1/2 of a Tramadol but it did not help sleep at night but now sleepy during day.  Protocols used: Information Only Call - No Triage-A-AH

## 2022-06-30 ENCOUNTER — Other Ambulatory Visit: Payer: Self-pay | Admitting: Family Medicine

## 2022-06-30 ENCOUNTER — Other Ambulatory Visit: Payer: Self-pay

## 2022-06-30 DIAGNOSIS — E1169 Type 2 diabetes mellitus with other specified complication: Secondary | ICD-10-CM

## 2022-06-30 MED ORDER — TRULICITY 1.5 MG/0.5ML ~~LOC~~ SOAJ
1.5000 mg | SUBCUTANEOUS | 2 refills | Status: DC
  Filled 2022-06-30 – 2022-07-12 (×2): qty 2, 28d supply, fill #0
  Filled 2022-08-09: qty 2, 28d supply, fill #1
  Filled 2022-09-26: qty 2, 28d supply, fill #2

## 2022-07-01 ENCOUNTER — Other Ambulatory Visit: Payer: Self-pay

## 2022-07-04 ENCOUNTER — Other Ambulatory Visit: Payer: Self-pay | Admitting: Obstetrics and Gynecology

## 2022-07-04 ENCOUNTER — Other Ambulatory Visit: Payer: Self-pay

## 2022-07-04 DIAGNOSIS — Z1231 Encounter for screening mammogram for malignant neoplasm of breast: Secondary | ICD-10-CM

## 2022-07-05 ENCOUNTER — Other Ambulatory Visit: Payer: Self-pay

## 2022-07-10 ENCOUNTER — Emergency Department (HOSPITAL_COMMUNITY)
Admission: EM | Admit: 2022-07-10 | Discharge: 2022-07-11 | Disposition: A | Discharge status: Home / Self Care | Payer: Self-pay | Attending: Emergency Medicine | Admitting: Emergency Medicine

## 2022-07-10 ENCOUNTER — Encounter (HOSPITAL_COMMUNITY): Payer: Self-pay

## 2022-07-10 ENCOUNTER — Other Ambulatory Visit: Payer: Self-pay

## 2022-07-10 DIAGNOSIS — I4891 Unspecified atrial fibrillation: Secondary | ICD-10-CM | POA: Insufficient documentation

## 2022-07-10 DIAGNOSIS — R110 Nausea: Secondary | ICD-10-CM | POA: Insufficient documentation

## 2022-07-10 DIAGNOSIS — E119 Type 2 diabetes mellitus without complications: Secondary | ICD-10-CM | POA: Insufficient documentation

## 2022-07-10 DIAGNOSIS — R0600 Dyspnea, unspecified: Secondary | ICD-10-CM | POA: Insufficient documentation

## 2022-07-10 DIAGNOSIS — Z7901 Long term (current) use of anticoagulants: Secondary | ICD-10-CM | POA: Insufficient documentation

## 2022-07-10 DIAGNOSIS — R600 Localized edema: Secondary | ICD-10-CM | POA: Insufficient documentation

## 2022-07-10 DIAGNOSIS — R059 Cough, unspecified: Secondary | ICD-10-CM | POA: Insufficient documentation

## 2022-07-10 DIAGNOSIS — Z794 Long term (current) use of insulin: Secondary | ICD-10-CM | POA: Insufficient documentation

## 2022-07-10 DIAGNOSIS — Z7984 Long term (current) use of oral hypoglycemic drugs: Secondary | ICD-10-CM | POA: Insufficient documentation

## 2022-07-10 DIAGNOSIS — R109 Unspecified abdominal pain: Secondary | ICD-10-CM | POA: Insufficient documentation

## 2022-07-10 LAB — COMPREHENSIVE METABOLIC PANEL
ALT: 19 U/L (ref 0–44)
AST: 18 U/L (ref 15–41)
Albumin: 4.3 g/dL (ref 3.5–5.0)
Alkaline Phosphatase: 46 U/L (ref 38–126)
Anion gap: 7 (ref 5–15)
BUN: 9 mg/dL (ref 6–20)
CO2: 29 mmol/L (ref 22–32)
Calcium: 9.9 mg/dL (ref 8.9–10.3)
Chloride: 109 mmol/L (ref 98–111)
Creatinine, Ser: 0.4 mg/dL — ABNORMAL LOW (ref 0.44–1.00)
GFR, Estimated: >60 mL/min (ref >60)
Glucose, Bld: 119 mg/dL — ABNORMAL HIGH (ref 70–99)
Potassium: 3.8 mmol/L (ref 3.5–5.1)
Sodium: 145 mmol/L (ref 135–145)
Total Bilirubin: 0.4 mg/dL (ref 0.3–1.2)
Total Protein: 8.3 g/dL — ABNORMAL HIGH (ref 6.5–8.1)

## 2022-07-10 LAB — URINALYSIS, ROUTINE W REFLEX MICROSCOPIC
Bilirubin Urine: NEGATIVE
Glucose, UA: NEGATIVE mg/dL
Hgb urine dipstick: NEGATIVE
Ketones, ur: NEGATIVE mg/dL
Leukocytes,Ua: NEGATIVE
Nitrite: NEGATIVE
Protein, ur: NEGATIVE mg/dL
Specific Gravity, Urine: 1.021 (ref 1.005–1.030)
pH: 5 (ref 5.0–8.0)

## 2022-07-10 LAB — CBC WITH DIFFERENTIAL/PLATELET
Abs Immature Granulocytes: 0.02 10*3/uL (ref 0.00–0.07)
Basophils Absolute: 0 10*3/uL (ref 0.0–0.1)
Basophils Relative: 1 %
Eosinophils Absolute: 0.1 10*3/uL (ref 0.0–0.5)
Eosinophils Relative: 2 %
HCT: 42.5 % (ref 36.0–46.0)
Hemoglobin: 13.4 g/dL (ref 12.0–15.0)
Immature Granulocytes: 0 %
Lymphocytes Relative: 43 %
Lymphs Abs: 3.4 10*3/uL (ref 0.7–4.0)
MCH: 30.4 pg (ref 26.0–34.0)
MCHC: 31.5 g/dL (ref 30.0–36.0)
MCV: 96.4 fL (ref 80.0–100.0)
Monocytes Absolute: 0.8 10*3/uL (ref 0.1–1.0)
Monocytes Relative: 10 %
Neutro Abs: 3.6 10*3/uL (ref 1.7–7.7)
Neutrophils Relative %: 44 %
Platelets: 253 10*3/uL (ref 150–400)
RBC: 4.41 MIL/uL (ref 3.87–5.11)
RDW: 13.5 % (ref 11.5–15.5)
WBC: 8 10*3/uL (ref 4.0–10.5)
nRBC: 0 % (ref 0.0–0.2)

## 2022-07-10 LAB — LIPASE, BLOOD: Lipase: 38 U/L (ref 11–51)

## 2022-07-10 NOTE — ED Provider Notes (Signed)
[facility] DEPT Provider Note   CSN: [id number] Arrival date & time: 07/10/22  1929     History  Chief Complaint  Patient presents with   Flank Pain    Haley [name] is a 56 y.o. female.  The history is provided by the patient and medical records.  Flank Pain  Haley [name] is a 56 y.o. female who presents to the Emergency Department complaining of flank pain.  She presents the emergency department complaining of 3 days of right flank pain.  Pain is described as throbbing and radiates to her back.  Pain is worse with movement and exertion.  She does have mild dyspnea on exertion but this has been an ongoing issue for a while.  No associated chest pain.   No injuries. No fever.  Has nausea.  Mild cough, nonproductive-ongoing issue.  No lower extremity edema.  No vomiting.  Intermittent constipation/diarrhea.  No dysuria.    Dm, afib, graves.  Takes eliquis for A-fib.        Home Medications Prior to Admission medications   Medication Sig Start Date End Date Taking? Authorizing Provider  methocarbamol (ROBAXIN) 500 MG tablet Take 1 tablet (500 mg total) by mouth every 6 (six) hours as needed for muscle spasms. 07/11/22  Yes Quintella Reichert, MD  apixaban (ELIQUIS) 5 MG TABS tablet Take 1 tablet (5 mg total) by mouth 2 (two) times daily. 03/07/22   Kroeger, Lorelee Cover., PA-C  atorvastatin (LIPITOR) 20 MG tablet TAKE 1 TABLET (20 MG TOTAL) BY MOUTH DAILY. 09/24/21 09/24/22  Charlott Rakes, MD  Blood Glucose Monitoring Suppl (TRUE METRIX METER) w/Device KIT Use twice daily 02/19/19   Elsie Stain, MD  Dulaglutide (TRULICITY) 1.5 GM/0.1UU SOPN INJECT 1.5 MG INTO THE SKIN ONCE A WEEK. 06/30/22   Charlott Rakes, MD  furosemide (LASIX) 20 MG tablet Take 1 tablet (20 mg total) by mouth daily. 10/15/21   Kroeger, Lorelee Cover., PA-C  gabapentin (NEURONTIN) 100 MG capsule TAKE 1 CAPSULE (100 MG TOTAL) BY MOUTH AT BEDTIME. 06/15/22 06/15/23  [name], NP  glipiZIDE (GLUCOTROL) 10 MG tablet Take 1 tablet (10 mg total) by mouth 2 (two) times daily before a meal. 04/22/22 04/22/23  [name], NP  glucose blood (TRUE METRIX BLOOD GLUCOSE TEST) test strip Use as instructed. Check blood glucose level by fingerstick twice per day. 03/24/20   [name], NP  lisinopril (ZESTRIL) 2.5 MG tablet TAKE 2 TABLETS (5 MG TOTAL) BY MOUTH DAILY. 05/12/22   [name], NP  metFORMIN (GLUCOPHAGE-XR) 500 MG 24 hr tablet Take 4 tablets (2,000 mg total) by mouth at bedtime. 06/21/22 12/18/22  [name], NP  methimazole (TAPAZOLE) 5 MG tablet Take 0.5 tablets (2.5 mg total) by mouth daily. Patient not taking: Reported on 05/23/2022 08/21/21   Philemon Kingdom, MD  Methylsulfonylmethane (MSM PO) Take 2 tablets by mouth daily.    [provider]  metoprolol succinate (TOPROL-XL) 50 MG 24 hr tablet Take 1 tablet (50 mg total) by mouth daily. 10/15/21   Kroeger, Lorelee Cover., PA-C  Multiple Vitamins-Minerals (ALIVE WOMENS 50+) TABS Take 1 tablet by mouth daily.    [provider]  naproxen (NAPROSYN) 500 MG tablet Take 1 tablet (500 mg total) by mouth 2 (two) times daily as needed. 05/23/22   [name], NP  naproxen sodium (ALEVE) 220 MG tablet Take 220 mg by mouth daily as needed. Patient not taking: Reported on 05/23/2022  [provider]  traZODone (DESYREL) 150 MG tablet Take 0.5-1 tablets (75-150 mg total) by mouth at bedtime. 06/21/22   [name], NP  TRUEplus Lancets 28G MISC Use twice daily for blood glucose check 03/24/20   [name], NP      Allergies    Patient has no known allergies.    Review of Systems   Review of Systems  Genitourinary:  Positive for flank pain.  All other systems reviewed and are negative.   Physical Exam Updated Vital Signs BP (!) 124/56   Pulse 74   Temp 98.8 F (37.1 C) (Oral)   Resp 17   Ht $R'5\' 7"'yi$  (1.702 m)   Wt 127 kg   SpO2 96%   BMI 43.85 kg/m   Physical Exam Vitals and nursing note reviewed.  Constitutional:      Appearance: She is well-developed.  HENT:     Head: Normocephalic and atraumatic.  Cardiovascular:     Rate and Rhythm: Normal rate and regular rhythm.     Heart sounds: No murmur heard. Pulmonary:     Effort: Pulmonary effort is normal. No respiratory distress.     Breath sounds: Normal breath sounds.  Abdominal:     Palpations: Abdomen is soft.     Tenderness: There is abdominal tenderness. There is no guarding or rebound.     Comments: Moderate right sided abdominal tenderness  Musculoskeletal:        General: No tenderness.     Comments: Trace pitting edema to BLE.  2+ DP pulses  Skin:    General: Skin is warm and dry.  Neurological:     Mental Status: She is alert and oriented to person, place, and time.  Psychiatric:        Behavior: Behavior normal.     ED Results / Procedures / Treatments   Labs (all labs ordered are listed, but only abnormal results are displayed) Labs Reviewed  COMPREHENSIVE METABOLIC PANEL - Abnormal; Notable for the following components:      Result Value   Glucose, Bld 119 (*)    Creatinine, Ser 0.40 (*)    Total Protein 8.3 (*)    All other components within normal limits  URINALYSIS, ROUTINE W REFLEX MICROSCOPIC - Abnormal; Notable for the following components:   Color, Urine AMBER (*)    APPearance HAZY (*)    All other components within normal limits  CBC WITH DIFFERENTIAL/PLATELET  LIPASE, BLOOD  I-STAT BETA HCG BLOOD, ED ([facility], [facility], [facility] ONLY)    EKG None  Radiology CT Angio Chest PE W/Cm &/Or Wo Cm  Result Date: 07/11/2022 CLINICAL DATA:  Right lower quadrant abdominal pain EXAM: CT ANGIOGRAPHY CHEST CT ABDOMEN AND PELVIS WITH CONTRAST TECHNIQUE: Multidetector CT imaging of the chest was performed using the standard protocol during bolus administration of intravenous contrast. Multiplanar CT image reconstructions and MIPs were obtained to evaluate the vascular  anatomy. Multidetector CT imaging of the abdomen and pelvis was performed using the standard protocol during bolus administration of intravenous contrast. RADIATION DOSE REDUCTION: This exam was performed according to the departmental dose-optimization program which includes automated exposure control, adjustment of the mA and/or kV according to patient size and/or use of iterative reconstruction technique. CONTRAST:  151mL OMNIPAQUE IOHEXOL 350 MG/ML SOLN COMPARISON:  None Available. FINDINGS: CTA CHEST FINDINGS Cardiovascular: Contrast injection is sufficient to demonstrate satisfactory opacification of the pulmonary arteries to the segmental level. There is no pulmonary embolus. The main pulmonary artery is  within normal limits for size. There is no CT evidence of acute right heart strain. There is mild calcific aortic atherosclerosis. There is a normal 3-vessel arch branching pattern. Heart size is normal, without pericardial effusion. Mediastinum/Nodes: There is an enlarged thyroid gland with the left lobe extending into the upper mediastinum. Lungs/Pleura: Mild ground-glass opacity, likely pulmonary edema. Musculoskeletal: No chest wall abnormality. No acute or significant osseous findings. Review of the MIP images confirms the above findings. CT ABDOMEN and PELVIS FINDINGS Hepatobiliary: Normal hepatic contours and density. No visible biliary dilatation. Normal gallbladder. Pancreas: Normal contours without ductal dilatation. No peripancreatic fluid collection. Spleen: Normal. Adrenals/Urinary Tract: --Adrenal glands: Normal. --Right kidney/ureter: No hydronephrosis or perinephric stranding. No nephrolithiasis. No obstructing ureteral stones. --Left kidney/ureter: No hydronephrosis or perinephric stranding. No nephrolithiasis. No obstructing ureteral stones. --Urinary bladder: Unremarkable. Stomach/Bowel: --Stomach/Duodenum: No hiatal hernia or other gastric abnormality. Normal duodenal course and caliber.  --Small bowel: No dilatation or inflammation. --Colon: No focal abnormality. --Appendix: Normal. Vascular/Lymphatic: Atherosclerotic calcification is present within the non-aneurysmal abdominal aorta, without hemodynamically significant stenosis. No abdominal or pelvic lymphadenopathy. Reproductive: Ventral fundal fibroid. Musculoskeletal. No bony spinal canal stenosis or focal osseous abnormality. Other: None. IMPRESSION: 1. No pulmonary embolus or acute aortic syndrome. 2. No acute abnormality of the abdomen or pelvis. 3. Enlarged thyroid gland with left lobe extending into the upper mediastinum. Recommend thyroid ultrasound (ref: J Am Coll Radiol. 2015 Feb;12(2): 143-50). 4. Mild ground-glass opacity, likely pulmonary edema. 5. Aortic Atherosclerosis (ICD10-I70.0). Electronically Signed   By: Ulyses Jarred M.D.   On: 07/11/2022 00:54   CT Abdomen Pelvis W Contrast  Result Date: 07/11/2022 CLINICAL DATA:  Right lower quadrant abdominal pain EXAM: CT ANGIOGRAPHY CHEST CT ABDOMEN AND PELVIS WITH CONTRAST TECHNIQUE: Multidetector CT imaging of the chest was performed using the standard protocol during bolus administration of intravenous contrast. Multiplanar CT image reconstructions and MIPs were obtained to evaluate the vascular anatomy. Multidetector CT imaging of the abdomen and pelvis was performed using the standard protocol during bolus administration of intravenous contrast. RADIATION DOSE REDUCTION: This exam was performed according to the departmental dose-optimization program which includes automated exposure control, adjustment of the mA and/or kV according to patient size and/or use of iterative reconstruction technique. CONTRAST:  184m OMNIPAQUE IOHEXOL 350 MG/ML SOLN COMPARISON:  None Available. FINDINGS: CTA CHEST FINDINGS Cardiovascular: Contrast injection is sufficient to demonstrate satisfactory opacification of the pulmonary arteries to the segmental level. There is no pulmonary embolus. The  main pulmonary artery is within normal limits for size. There is no CT evidence of acute right heart strain. There is mild calcific aortic atherosclerosis. There is a normal 3-vessel arch branching pattern. Heart size is normal, without pericardial effusion. Mediastinum/Nodes: There is an enlarged thyroid gland with the left lobe extending into the upper mediastinum. Lungs/Pleura: Mild ground-glass opacity, likely pulmonary edema. Musculoskeletal: No chest wall abnormality. No acute or significant osseous findings. Review of the MIP images confirms the above findings. CT ABDOMEN and PELVIS FINDINGS Hepatobiliary: Normal hepatic contours and density. No visible biliary dilatation. Normal gallbladder. Pancreas: Normal contours without ductal dilatation. No peripancreatic fluid collection. Spleen: Normal. Adrenals/Urinary Tract: --Adrenal glands: Normal. --Right kidney/ureter: No hydronephrosis or perinephric stranding. No nephrolithiasis. No obstructing ureteral stones. --Left kidney/ureter: No hydronephrosis or perinephric stranding. No nephrolithiasis. No obstructing ureteral stones. --Urinary bladder: Unremarkable. Stomach/Bowel: --Stomach/Duodenum: No hiatal hernia or other gastric abnormality. Normal duodenal course and caliber. --Small bowel: No dilatation or inflammation. --Colon: No focal abnormality. --Appendix: Normal. Vascular/Lymphatic: Atherosclerotic calcification  is present within the non-aneurysmal abdominal aorta, without hemodynamically significant stenosis. No abdominal or pelvic lymphadenopathy. Reproductive: Ventral fundal fibroid. Musculoskeletal. No bony spinal canal stenosis or focal osseous abnormality. Other: None. IMPRESSION: 1. No pulmonary embolus or acute aortic syndrome. 2. No acute abnormality of the abdomen or pelvis. 3. Enlarged thyroid gland with left lobe extending into the upper mediastinum. Recommend thyroid ultrasound (ref: J Am Coll Radiol. 2015 Feb;12(2): 143-50). 4. Mild  ground-glass opacity, likely pulmonary edema. 5. Aortic Atherosclerosis (ICD10-I70.0). Electronically Signed   By: Ulyses Jarred M.D.   On: 07/11/2022 00:54    Procedures Procedures    Medications Ordered in ED Medications  fentaNYL (SUBLIMAZE) injection 50 mcg (50 mcg Intravenous Given 07/11/22 0010)  iohexol (OMNIPAQUE) 350 MG/ML injection 100 mL (100 mLs Intravenous Contrast Given 07/11/22 0020)    ED Course/ Medical Decision Making/ A&P                           Medical Decision Making Amount and/or Complexity of Data Reviewed Radiology: ordered.  Risk Prescription drug management.   Patient with history of A-fib, diabetes, Graves' disease here for evaluation of right flank pain/right-sided abdominal pain.  She does have tenderness on examination without peritoneal findings she has been experiencing longstanding shortness of breath, unchanged from her baseline.  No respiratory distress on examination.  Given description of symptoms a CTA PE protocol as well as a CT abdomen pelvis were obtained.  Imaging is negative for pneumonia or PE.  No evidence of acute intra-abdominal process.  She does have incidental findings of enlarged thyroid on imaging-discussed this finding with patient, will require further follow-up as an outpatient.  Her pain is significantly improved after treatment in the emergency department.  Discussed with patient unclear source of symptoms, question musculoskeletal source.  Discussed home care with OTC medications as well as prescribed muscle relaxer.  Discussed outpatient follow-up and return precautions.        Final Clinical Impression(s) / ED Diagnoses Final diagnoses:  Right flank pain    Rx / DC Orders ED Discharge Orders          Ordered    methocarbamol (ROBAXIN) 500 MG tablet  Every 6 hours PRN        07/11/22 0144              Quintella Reichert, MD 07/11/22 [phone]

## 2022-07-10 NOTE — ED Triage Notes (Addendum)
Patient has had right flank pain for 3 days. Began in her abdomen and has moved to the right flank area. Nauseous but has not vomited. No painful urination. So painful it has caused her to become more winded as she walks.

## 2022-07-10 NOTE — ED Provider Triage Note (Signed)
Emergency Medicine Provider Triage Evaluation Note  Haley [name] , a 56 y.o. female  was evaluated in triage.  Pt complains of right sided abdominal and flank pain.  It started in the anterior right abdomen and spread.   No urinary symptoms.   Nausea with out vomiting or diarrhea.    Physical Exam  BP 137/65 (BP Location: Left Arm)   Pulse 86   Temp 98.2 F (36.8 C) (Oral)   Resp 19   Ht '5\' 7"'$  (1.702 m)   Wt 127 kg   SpO2 94%   BMI 43.85 kg/m  Gen:   Awake, no distress   Resp:  Normal effort  MSK:   Moves extremities without difficulty  Other:  Normal speech, TTP ruq  Medical Decision Making  Medically screening exam initiated at 7:47 PM.  Appropriate orders placed.  Haley [name] was informed that the remainder of the evaluation will be completed by another provider, this initial triage assessment does not replace that evaluation, and the importance of remaining in the ED until their evaluation is complete.     Lorin Glass, [place] 07/10/22 1950

## 2022-07-11 ENCOUNTER — Other Ambulatory Visit: Payer: Self-pay

## 2022-07-11 ENCOUNTER — Encounter (HOSPITAL_COMMUNITY): Payer: Self-pay

## 2022-07-11 ENCOUNTER — Emergency Department (HOSPITAL_COMMUNITY): Payer: Self-pay

## 2022-07-11 MED ORDER — IOHEXOL 350 MG/ML SOLN
100.0000 mL | Freq: Once | INTRAVENOUS | Status: AC | PRN
  Administered 2022-07-11: 100 mL via INTRAVENOUS

## 2022-07-11 MED ORDER — FENTANYL CITRATE PF 50 MCG/ML IJ SOSY
50.0000 ug | PREFILLED_SYRINGE | Freq: Once | INTRAMUSCULAR | Status: AC
  Administered 2022-07-11: 50 ug via INTRAVENOUS
  Filled 2022-07-11: qty 1

## 2022-07-11 MED ORDER — METHOCARBAMOL 500 MG PO TABS
500.0000 mg | ORAL_TABLET | Freq: Four times a day (QID) | ORAL | 0 refills | Status: DC | PRN
  Filled 2022-07-11: qty 20, 5d supply, fill #0

## 2022-07-11 NOTE — Discharge Instructions (Addendum)
You had a CT scan today that showed your thyroid gland is enlarged and needs to be further evaluated with an ultrasound.

## 2022-07-12 ENCOUNTER — Other Ambulatory Visit: Payer: Self-pay

## 2022-07-12 ENCOUNTER — Other Ambulatory Visit: Payer: Self-pay | Admitting: Nurse Practitioner

## 2022-07-12 DIAGNOSIS — I10 Essential (primary) hypertension: Secondary | ICD-10-CM

## 2022-07-12 DIAGNOSIS — E1169 Type 2 diabetes mellitus with other specified complication: Secondary | ICD-10-CM

## 2022-07-13 ENCOUNTER — Other Ambulatory Visit: Payer: Self-pay

## 2022-07-13 MED ORDER — LISINOPRIL 2.5 MG PO TABS
ORAL_TABLET | ORAL | 1 refills | Status: DC
  Filled 2022-07-13: qty 60, 30d supply, fill #0
  Filled 2022-08-11: qty 60, 30d supply, fill #1
  Filled 2022-10-04 – 2022-10-11 (×2): qty 60, 30d supply, fill #2

## 2022-07-13 MED ORDER — GLIPIZIDE 10 MG PO TABS
10.0000 mg | ORAL_TABLET | Freq: Two times a day (BID) | ORAL | 1 refills | Status: DC
  Filled 2022-07-13: qty 60, 30d supply, fill #0
  Filled 2022-08-10: qty 60, 30d supply, fill #1
  Filled 2022-10-03 – 2022-10-11 (×2): qty 60, 30d supply, fill #2
  Filled 2022-11-09: qty 60, 30d supply, fill #3
  Filled 2022-12-15: qty 60, 30d supply, fill #4
  Filled 2023-01-15: qty 60, 30d supply, fill #5

## 2022-07-13 NOTE — Telephone Encounter (Signed)
Requested Prescriptions  Pending Prescriptions Disp Refills  . glipiZIDE (GLUCOTROL) 10 MG tablet 60 tablet 1    Sig: Take 1 tablet (10 mg total) by mouth 2 (two) times daily before a meal.     Endocrinology:  Diabetes - Sulfonylureas Failed - 07/12/2022  2:23 PM      Failed - Cr in normal range and within 360 days    Creatinine, Ser  Date Value Ref Range Status  07/10/2022 0.40 (L) 0.44 - 1.00 mg/dL Final         Passed - HBA1C is between 0 and 7.9 and within 180 days    Hgb A1c MFr Bld  Date Value Ref Range Status  05/23/2022 6.5 (H) 4.8 - 5.6 % Final    Comment:             Prediabetes: 5.7 - 6.4          Diabetes: >6.4          Glycemic control for adults with diabetes: <7.0          Passed - Valid encounter within last 6 months    Recent Outpatient Visits          3 weeks ago Insomnia secondary to situational depression   [place], Vernia Buff, NP   1 month ago Chronic pain of both [place], Vernia Buff, NP   10 months ago Essential hypertension   [place], Vernia Buff, NP   1 year ago Graves disease   [place], [place], [place]   1 year ago Essential hypertension   [place], [place] W, NP             . lisinopril (ZESTRIL) 2.5 MG tablet 60 tablet 0    Sig: TAKE 2 TABLETS (5 MG TOTAL) BY MOUTH DAILY.     Cardiovascular:  ACE Inhibitors Failed - 07/12/2022  2:23 PM      Failed - Cr in normal range and within 180 days    Creatinine, Ser  Date Value Ref Range Status  07/10/2022 0.40 (L) 0.44 - 1.00 mg/dL Final         Passed - K in normal range and within 180 days    Potassium  Date Value Ref Range Status  07/10/2022 3.8 3.5 - 5.1 mmol/L Final         Passed - Patient is not pregnant      Passed - Last BP in normal range    BP Readings from Last 1 Encounters:   07/11/22 (!) 124/56         Passed - Valid encounter within last 6 months    Recent Outpatient Visits          3 weeks ago Insomnia secondary to situational depression   [place], Vernia Buff, NP   1 month ago Chronic pain of both knees   [place], Vernia Buff, NP   10 months ago Essential hypertension   [place], Vernia Buff, NP   1 year ago Graves disease   [place], [place], [place]   1 year ago Essential hypertension   [place], Vernia Buff, NP

## 2022-07-19 ENCOUNTER — Other Ambulatory Visit: Payer: Self-pay | Admitting: Nurse Practitioner

## 2022-07-19 ENCOUNTER — Other Ambulatory Visit: Payer: Self-pay

## 2022-07-19 ENCOUNTER — Ambulatory Visit: Payer: Self-pay | Admitting: *Deleted

## 2022-07-19 MED ORDER — METHOCARBAMOL 500 MG PO TABS
500.0000 mg | ORAL_TABLET | Freq: Four times a day (QID) | ORAL | 3 refills | Status: DC | PRN
  Filled 2022-07-19: qty 60, 15d supply, fill #0

## 2022-07-19 NOTE — Telephone Encounter (Signed)
Robaxin has been sent

## 2022-07-19 NOTE — Telephone Encounter (Signed)
  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 Disposition: '[]'$ ED /'[]'$ Urgent Care (no appt availability in office) / '[]'$ Appointment(In office/virtual)/ '[]'$  [facility]/ '[]'$ Home Care/ '[]'$ Refused Recommended Disposition /'[]'$ [facility]/ '[x]'$  Follow-up with PCP Additional Notes: Message sent to [facility] for Geryl Rankins, NP to see if she will approve a refill on the Robaxin 500 mg.

## 2022-07-19 NOTE — Telephone Encounter (Signed)
Message from Erick Blinks sent at 07/19/2022  2:47 PM EDT  Summary: Med questions for PCP, declined appt   Pt called wanting to discuss methocarbamol (ROBAXIN) 500 MG tablet has questions for PCP. Declined appt   Best contact: [phone]           Call History   Type Contact Phone/Fax User  07/19/2022 02:46 PM EDT Phone (Incoming) [name], Haley Herard (Self) [phone] Haley [name]) Erick Blinks   Reason for Disposition  [1] Caller has URGENT medicine question about med that PCP or specialist prescribed AND [2] triager unable to answer question  Answer Assessment - Initial Assessment Questions 1. NAME of MEDICINE: "What medicine(s) are you calling about?"     Robaxin  2. QUESTION: "What is your question?" (e.g., double dose of medicine, side effect)     No refills remain per the bottle.     It was given to me at the hospital for a muscle strain.   They did an x ray and decided it was a muscle.   The muscle relaxing medicine really helps.   My right side of stomach has a strained muscle.   Gallbladder problems were ruled out. 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   The muscle relaxing medicine really helps.   Would Zelda be willing to give me a refill?   3. PRESCRIBER: "Who prescribed the medicine?" Reason: if prescribed by specialist, call should be referred to that group.     Given to her at the hospital 4. SYMPTOMS: "Do you have any symptoms?" If Yes, ask: "What symptoms are you having?"  "How bad are the symptoms (e.g., mild, moderate, severe)   I have a strained muscle on the right side of my stomach. 5. PREGNANCY:  "Is there any chance that you are pregnant?" "When was your last menstrual period?"     Not asked  Protocols used: Medication Question Call-A-AH

## 2022-07-20 ENCOUNTER — Other Ambulatory Visit: Payer: Self-pay

## 2022-07-20 NOTE — Telephone Encounter (Signed)
Left message on voicemail that Rx was sent to [facility].

## 2022-07-27 ENCOUNTER — Other Ambulatory Visit: Payer: Self-pay

## 2022-08-08 ENCOUNTER — Other Ambulatory Visit: Payer: Self-pay | Admitting: Nurse Practitioner

## 2022-08-08 DIAGNOSIS — E1169 Type 2 diabetes mellitus with other specified complication: Secondary | ICD-10-CM

## 2022-08-09 ENCOUNTER — Other Ambulatory Visit: Payer: Self-pay

## 2022-08-10 ENCOUNTER — Other Ambulatory Visit: Payer: Self-pay

## 2022-08-10 MED ORDER — GABAPENTIN 100 MG PO CAPS
ORAL_CAPSULE | Freq: Every day | ORAL | 0 refills | Status: DC
  Filled 2022-08-10: qty 30, 30d supply, fill #0

## 2022-08-11 ENCOUNTER — Other Ambulatory Visit: Payer: Self-pay

## 2022-08-25 ENCOUNTER — Ambulatory Visit: Payer: Self-pay

## 2022-09-20 ENCOUNTER — Other Ambulatory Visit: Payer: Self-pay

## 2022-09-26 ENCOUNTER — Other Ambulatory Visit: Payer: Self-pay

## 2022-09-26 ENCOUNTER — Other Ambulatory Visit: Payer: Self-pay | Admitting: Nurse Practitioner

## 2022-09-26 DIAGNOSIS — I10 Essential (primary) hypertension: Secondary | ICD-10-CM

## 2022-09-26 MED ORDER — METOPROLOL SUCCINATE ER 50 MG PO TB24
50.0000 mg | ORAL_TABLET | Freq: Every day | ORAL | 0 refills | Status: DC
  Filled 2022-09-26 – 2022-11-09 (×2): qty 90, 90d supply, fill #0

## 2022-09-26 MED ORDER — FUROSEMIDE 20 MG PO TABS
20.0000 mg | ORAL_TABLET | Freq: Every day | ORAL | 0 refills | Status: DC
  Filled 2022-09-26 – 2022-11-10 (×2): qty 90, 90d supply, fill #0

## 2022-09-27 ENCOUNTER — Other Ambulatory Visit: Payer: Self-pay

## 2022-10-03 ENCOUNTER — Other Ambulatory Visit: Payer: Self-pay | Admitting: Nurse Practitioner

## 2022-10-03 DIAGNOSIS — E1169 Type 2 diabetes mellitus with other specified complication: Secondary | ICD-10-CM

## 2022-10-04 ENCOUNTER — Other Ambulatory Visit: Payer: Self-pay | Admitting: Medical

## 2022-10-04 ENCOUNTER — Other Ambulatory Visit: Payer: Self-pay

## 2022-10-04 DIAGNOSIS — I480 Paroxysmal atrial fibrillation: Secondary | ICD-10-CM

## 2022-10-04 MED ORDER — GABAPENTIN 100 MG PO CAPS
100.0000 mg | ORAL_CAPSULE | Freq: Every day | ORAL | 2 refills | Status: DC
  Filled 2022-10-04 – 2022-10-11 (×2): qty 90, 90d supply, fill #0
  Filled 2023-01-24: qty 30, 30d supply, fill #1
  Filled 2023-02-17: qty 30, 30d supply, fill #2
  Filled 2023-03-22: qty 30, 30d supply, fill #3
  Filled 2023-04-21 (×2): qty 30, 30d supply, fill #4

## 2022-10-04 NOTE — Telephone Encounter (Signed)
Requested Prescriptions  Pending Prescriptions Disp Refills  . gabapentin (NEURONTIN) 100 MG capsule 90 capsule 2    Sig: TAKE 1 CAPSULE (100 MG TOTAL) BY MOUTH AT BEDTIME.     Neurology: Anticonvulsants - gabapentin Failed - 10/03/2022  9:55 PM      Failed - Cr in normal range and within 360 days    Creatinine, Ser  Date Value Ref Range Status  07/10/2022 0.40 (L) 0.44 - 1.00 mg/dL Final         Passed - Completed PHQ-2 or PHQ-9 in the last 360 days      Passed - Valid encounter within last 12 months    Recent Outpatient Visits          3 months ago Insomnia secondary to situational depression   [place], Vernia Buff, NP   4 months ago Chronic pain of both [place], Vernia Buff, NP   1 year ago Essential hypertension   [place], Vernia Buff, NP   1 year ago Graves disease   [facility] [place], [place], [place]   1 year ago Essential hypertension   [place], Vernia Buff, NP

## 2022-10-05 ENCOUNTER — Other Ambulatory Visit: Payer: Self-pay

## 2022-10-05 MED ORDER — APIXABAN 5 MG PO TABS
5.0000 mg | ORAL_TABLET | Freq: Two times a day (BID) | ORAL | 0 refills | Status: DC
  Filled 2022-10-05: qty 60, 30d supply, fill #0

## 2022-10-05 NOTE — Telephone Encounter (Signed)
Prescription refill request for Eliquis received. Indication: PAF Last office visit:  10/15/21  K Kroeger PA-C Scr: 0.40 on 07/10/22 Age: 56 Weight: 137.9kg  Based on above findings Eliquis '5mg'$  twice daily is the appropriate dose.  Refill approved x 1 only.  Pt is past due for apprt with Dr Claiborne Billings.  Message sent to schedulers to make appt. Also included message on med bottle.

## 2022-10-11 ENCOUNTER — Ambulatory Visit
Admission: RE | Admit: 2022-10-11 | Discharge: 2022-10-11 | Disposition: A | Discharge status: Home / Self Care | Payer: No Typology Code available for payment source | Source: Ambulatory Visit | Attending: Nurse Practitioner | Admitting: Nurse Practitioner

## 2022-10-11 ENCOUNTER — Other Ambulatory Visit: Payer: Self-pay

## 2022-10-11 ENCOUNTER — Ambulatory Visit: Payer: Medicaid Other | Admitting: *Deleted

## 2022-10-11 VITALS — BP 134/84 | Wt 279.6 lb

## 2022-10-11 DIAGNOSIS — Z1231 Encounter for screening mammogram for malignant neoplasm of breast: Secondary | ICD-10-CM

## 2022-10-11 DIAGNOSIS — Z1239 Encounter for other screening for malignant neoplasm of breast: Secondary | ICD-10-CM

## 2022-10-11 DIAGNOSIS — Z1211 Encounter for screening for malignant neoplasm of colon: Secondary | ICD-10-CM

## 2022-10-11 NOTE — Progress Notes (Signed)
Haley [name] is a 56 y.o. female who presents to [facility] clinic today with no complaints.    Pap Smear: Pap smear not completed today. Last Pap smear was 11/14/2019 at [facility] and was normal with negative HPV. Per patient has history of an abnormal Pap smear around 20 years ago that a colposcopy was completed for follow up. Patient stated that all Pap smears have been normal since colposcopy and that she has had at least three normal Pap smears. Last Pap smear result is available in Epic.   Physical exam: Breasts Breasts symmetrical. No skin abnormalities bilateral breasts. No nipple retraction bilateral breasts. No nipple discharge bilateral breasts. No lymphadenopathy. No lumps palpated bilateral breasts. No complaints of pain or tenderness on exam.   Pelvic/Bimanual Pap is not indicated today per BCCCP guidelines.   Smoking History: Patient is a former smoker that quit in 2013.   Patient Navigation: Patient education provided. Access to services provided for patient through [facility] program.   Colorectal Cancer Screening: Per patient has completed a colonoscopy 10 years ago. Patient completed a FIT test 02/19/2019 that was negative. No complaints today.    Breast and Cervical Cancer Risk Assessment: Patient does not have family history of breast cancer, known genetic mutations, or radiation treatment to the chest before age 48. Per patient has history of cervical dysplasia. Patient has no history of being immunocompromised or DES exposure in-utero.  Risk Assessment     Risk Scores       10/11/2022   Last edited by: Loletta Parish, RN   5-year risk: 1.4 %   Lifetime risk: 7.7 %           A: BCCCP exam without pap smear No complaints.  P: Referred patient to the [facility] for a screening mammogram on mobile unit. Appointment scheduled Tuesday, October 11, 2022 at 1430.  Loletta Parish,  RN 10/11/2022 2:47 PM

## 2022-10-11 NOTE — Patient Instructions (Signed)
Explained breast self awareness with Judeen Hammans [place]. Patient did not need a Pap smear today due to last Pap smear and HPV typing was 11/14/2019. Let her know BCCCP will cover Pap smears and HPV typing every 5 years unless has a history of abnormal Pap smears. Referred patient to the [facility] for a screening mammogram on mobile unit. Appointment scheduled Tuesday, October 11, 2022 at 1430. Patient aware of appointment and will be there. Let patient know the [facility] will follow up with her within the next couple weeks with results of her mammogram by letter or phone. [place] verbalized understanding.  Aliannah Holstrom, Arvil [place], RN 2:47 PM

## 2022-10-25 ENCOUNTER — Ambulatory Visit: Payer: Self-pay

## 2022-10-25 NOTE — Telephone Encounter (Signed)
  Chief Complaint: chest pain Symptoms: SOB w/ exertion, dizziness Frequency: chest pain x 2 weeks and SOB x 2 days Pertinent Negatives: Patient denies nausea, sweting occ. cough Disposition: '[x]'$ ED /'[]'$ Urgent Care (no appt availability in office) / '[]'$ Appointment(In office/virtual)/ '[]'$  [facility]/ '[]'$ Home Care/ '[]'$ Refused Recommended Disposition /'[]'$ [facility]/ '[]'$  Follow-up with PCP Additional Notes: h/o afib   Reason for Disposition  Dizziness or lightheadedness  Answer Assessment - Initial Assessment Questions 1. LOCATION: "Where does it hurt?"       Left of chest inside left breast 2. RADIATION: "Does the pain go anywhere else?" (e.g., into neck, jaw, arms, back)     no 3. ONSET: "When did the chest pain begin?" (Minutes, hours or days)      2 weeks  4. PATTERN: "Does the pain come and go, or has it been constant since it started?"  "Does it get worse with exertion?"      Yes-no 5. DURATION: "How long does it last" (e.g., seconds, minutes, hours)     2 minutes 6. SEVERITY: "How bad is the pain?"  (e.g., Scale 1-10; mild, moderate, or severe)    - MILD (1-3): doesn't interfere with normal activities     - MODERATE (4-7): interferes with normal activities or awakens from sleep    - SEVERE (8-10): excruciating pain, unable to do any normal activities       mild 7. CARDIAC RISK FACTORS: "Do you have any history of heart problems or risk factors for heart disease?" (e.g., angina, prior heart attack; diabetes, high blood pressure, high cholesterol, smoker, or strong family history of heart disease)     Afib dx 5 years 8. PULMONARY RISK FACTORS: "Do you have any history of lung disease?"  (e.g., blood clots in lung, asthma, emphysema, birth control pills)     no 9. CAUSE: "What do you think is causing the chest pain?"     Unsure 10. OTHER SYMPTOMS: "Do you have any other symptoms?" (e.g., dizziness, nausea, vomiting, sweating, fever, difficulty breathing, cough)        Dizziness if turns too fast or overexertion,  11. PREGNANCY: "Is there any chance you are pregnant?" "When was your last menstrual period?"       N/a  Protocols used: Chest Pain-A-AH

## 2022-10-25 NOTE — Telephone Encounter (Signed)
Patient advised to go to ED.

## 2022-11-09 ENCOUNTER — Other Ambulatory Visit: Payer: Self-pay | Admitting: Family Medicine

## 2022-11-09 ENCOUNTER — Other Ambulatory Visit: Payer: Self-pay

## 2022-11-09 ENCOUNTER — Other Ambulatory Visit: Payer: Self-pay | Admitting: Cardiovascular Disease

## 2022-11-09 DIAGNOSIS — E1169 Type 2 diabetes mellitus with other specified complication: Secondary | ICD-10-CM

## 2022-11-09 DIAGNOSIS — I480 Paroxysmal atrial fibrillation: Secondary | ICD-10-CM

## 2022-11-09 MED ORDER — TRULICITY 1.5 MG/0.5ML ~~LOC~~ SOAJ
1.5000 mg | SUBCUTANEOUS | 0 refills | Status: DC
  Filled 2022-11-09: qty 2, 28d supply, fill #0

## 2022-11-09 MED ORDER — APIXABAN 5 MG PO TABS
5.0000 mg | ORAL_TABLET | Freq: Two times a day (BID) | ORAL | 0 refills | Status: DC
  Filled 2022-11-09: qty 60, 30d supply, fill #0

## 2022-11-09 NOTE — Telephone Encounter (Signed)
Prescription refill request for Eliquis received. Indication: Afib  Last office visit:10/15/21 (Kroeger)  Scr: 0.40 (07/10/22)  Age: 56 Weight: 126.8kg  Overdue to see cardiologist. Message sent to schedulers to make an appt as soon as possible. 30 day supply sent to pharmacy.

## 2022-11-10 ENCOUNTER — Other Ambulatory Visit: Payer: Self-pay | Admitting: Nurse Practitioner

## 2022-11-10 ENCOUNTER — Telehealth: Payer: Self-pay | Admitting: Cardiovascular Disease

## 2022-11-10 ENCOUNTER — Other Ambulatory Visit: Payer: Self-pay

## 2022-11-10 DIAGNOSIS — E7800 Pure hypercholesterolemia, unspecified: Secondary | ICD-10-CM

## 2022-11-10 MED ORDER — ATORVASTATIN CALCIUM 20 MG PO TABS
20.0000 mg | ORAL_TABLET | Freq: Every day | ORAL | 2 refills | Status: DC
  Filled 2022-11-10: qty 30, 30d supply, fill #0
  Filled 2022-12-15: qty 30, 30d supply, fill #1
  Filled 2023-01-15: qty 30, 30d supply, fill #2

## 2022-11-10 NOTE — Telephone Encounter (Signed)
Patient stated that for the past month she gets SOB with walking. She also reported on and off edema in her feet. She stated that Dr. Raul Del increased her lasix to '40mg'$  daily. She also stated that her BiPap mask has air escaping. Mad appointment with Dr. Lenward Chancellor for 12/14.

## 2022-11-10 NOTE — Telephone Encounter (Signed)
Pt c/o Shortness Of Breath: STAT if SOB developed within the last 24 hours or pt is noticeably SOB on the phone  1. Are you currently SOB (can you hear that pt is SOB on the phone)?  No   2. How long have you been experiencing SOB?  Past few months  3. Are you SOB when sitting or when up moving around?  Up and moving around  4. Are you currently experiencing any other symptoms?  Tiredness, right side of face is puffy and droopy, but she is still able to move it  Patient also mentions that she has been having issues with BIPAP.

## 2022-11-14 NOTE — Telephone Encounter (Signed)
Mask leak will be addressed at upcoming 12/14 appointment.

## 2022-11-17 ENCOUNTER — Encounter: Payer: Self-pay | Admitting: Cardiovascular Disease

## 2022-11-17 ENCOUNTER — Ambulatory Visit: Payer: Medicaid Other | Attending: Cardiovascular Disease | Admitting: Cardiovascular Disease

## 2022-11-17 VITALS — BP 124/58 | HR 89 | Ht 67.0 in | Wt 277.4 lb

## 2022-11-17 DIAGNOSIS — I5032 Chronic diastolic (congestive) heart failure: Secondary | ICD-10-CM

## 2022-11-17 DIAGNOSIS — R0609 Other forms of dyspnea: Secondary | ICD-10-CM | POA: Diagnosis not present

## 2022-11-17 DIAGNOSIS — G4733 Obstructive sleep apnea (adult) (pediatric): Secondary | ICD-10-CM

## 2022-11-17 DIAGNOSIS — E785 Hyperlipidemia, unspecified: Secondary | ICD-10-CM

## 2022-11-17 DIAGNOSIS — I10 Essential (primary) hypertension: Secondary | ICD-10-CM | POA: Diagnosis not present

## 2022-11-17 NOTE — Patient Instructions (Addendum)
Medication Instructions:   Not needed *If you need a refill on your cardiac medications before your next appointment, please call your pharmacy*   Lab Work: CBC CMP LIPID Lpa If you have labs (blood work) drawn today and your tests are completely normal, you will receive your results only by: [place] (if you have MyChart) OR A paper copy in the mail If you have any lab test that is abnormal or we need to change your treatment, we will call you to review the results.   Testing/Procedures: Will be schedule at [facility] has requested that you have an echocardiogram. Echocardiography is a painless test that uses sound waves to create images of your heart. It provides your doctor with information about the size and shape of your heart and how well your heart's chambers and valves are working. This procedure takes approximately one hour. There are no restrictions for this procedure. Please do NOT wear cologne, perfume, aftershave, or lotions (deodorant is allowed). Please arrive 15 minutes prior to your appointment time.   Follow-Up: At [facility], you and your health needs are our priority.  As part of our continuing mission to provide you with exceptional heart care, we have created designated Provider Care Teams.  These Care Teams include your primary Cardiologist (physician) and Advanced Practice Providers (APPs -  Physician Assistants and Nurse Practitioners) who all work together to provide you with the care you need, when you need it.     Your next appointment:   6 month(s)  The format for your next appointment:   In Person  Provider:   [name], MD    Other Instructions

## 2022-11-17 NOTE — Progress Notes (Signed)
Cardiology Office Note    Date:  11/24/2022   ID:  Haley [name], DOB 07/03/1966, MRN [id number]  PCP:  [name], NP  Cardiologist:  [name], MD (sleep)  21 month sleep evaluation   History of Present Illness:  Haley [name] is a 56 y.o. female who presents for a 21 month follow-up sleep evaluation.  [name] has a history of PAF, which initially occurred in the setting of hyperthyroidism in 2010.  She developed recurrent paroxysmal atrial fibrillation and underwent cardioversion in the ED in September 2017.  She has a history of type 2 diabetes mellitus, morbid obesity, hypertension, and she had recently seen [name] in November 2017.  At that time, she was started on beta blocker therapy.  She was started on eliquis anticoagulation.  Due to concerns for obstructive sleep apnea.  She was referred for a sleep study which was done on 12/13/2016.  She had severe sleep apnea with an AHI of 111.2 per hour, an RDI of 113.  AHI during REM sleep was 74.2.  She had severe oxygen desaturation to 51%.  She underwent a CPAP, and ultimate BiPAP titration on 01/04/2017.  On her titration study CPAP was advanced to 19 cm water pressure but due to continued events, BiPAP was implemented and she required maximum titration up to 25/21.  At 23/19, AHI was still elevated 6.4, but 25/21 at AHI was 0.  Apparently, the patient had concerns about cost since she currently does not have insurance.  She was wondering about using a CPAP from a family member since he no longer uses treatment.  I was concerned that CPAP may not be as beneficial.  When I saw her in July 2018, she  brought the machine with her to the office today for our review and assessment.  She was given a ResMed S9 and had a  full face mas given to her the time of first titration study.  At that time she was going  to bed around 1 AM and waking up for good around noon.  She had frequent awakenings during the night  and daytime sleepiness.  Epworth Sleepiness Scale: Situation   Chance of Dozing/Sleeping (0 = never , 1 = slight chance , 2 = moderate chance , 3 = high chance )   sitting and reading 2   watching TV 2   sitting inactive in a public place 2   being a passenger in a motor vehicle for an hour or more 1   lying down in the afternoon 2   sitting and talking to someone 1   sitting quietly after lunch (no alcohol) 2   while stopped for a few minutes in traffic as the driver 2   Total Score  14   When I saw her, I recommended that her CPAP unit  adjusted to it 20 cm maximum pressure.  We had set up the process through assisted support for her to try to get a BiPAP machine.  She did reach out to [facility] since this was the DME company that had supplied her friend with the CPAP unit that she was using.  She has felt improved since using CPAP therapy.  However, she still admits to fatigue.  She feels  that in the middle of night she was not getting enough air and for this reason , was taking her mask off when she felt the sensation.  When I saw her in March  2020 he had not received a BiPAP machine she did not bring the CPAP of her friend who she was using for interrogation.  When I  saw her in August 2020, she was still having issues with sleep and admitted to no energy, poor sleep, snoring, and nocturia at least 3 times per night.  She is now working doing senior sitting.  She still does not have insurance.  She is unaware of any recurrent or fibrillation and continued to be on Eliquis for anticoagulation in addition to metoprolol succinate 50 mg in the morning and 25 mg in the evening.  She is diabetic on Trulicity in addition to metformin and glipizide.  She is on atorvastatin 20 mg daily for hyperlipidemia.  She was recently admitted to hospital in January 2021 with acute hypoxic respiratory failure secondary to acute diastolic CHF contributed by untreated sleep apnea.  CTA was negative for PE  although she had small airway disease.  Lower extremity Dopplers were negative for DVT.  An echo Doppler study revealed an EF of 60 to 65%, moderate LVH without wall motion abnormality.  She was treated with a 6.  She was maintaining sinus rhythm on beta-blocker and Eliquis.  Prior to her hospitalization she was not using her CPAP therapy.  Care management was consulted instantly with help from the heart and vascular patient find she ultimately was able to receive a new ResMed air curve BiPAP unit with set up date December 27, 2019.    I saw her in the office on January 16, 2020 for follow-up evaluation.  During that in office evaluation I was unable to obtain data since she had not been linked to our office.  However subsequently we were able to get her BiPAP unit linked  to our office.  Since her set up date of December 27, 2019 she has used it all days with the exception of 1.  Her EPAP pressure is set at 10 with IPAP pressure of 18.  AHI is 4.4 with a central index of 1.4.  She has felt significantly improved since reinitiating therapy and feels significantly better with compared to her prior CPAP.  She is unaware of any breakthrough snoring.  She is also having oxygen supplementation through her BiPAP unit.  She is try to make dietary adjustments with her morbid obesity.  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  She was seen in follow-up in May 2021 and over the previous  several months, she has felt well.  I obtained a download from March 29, 2020 through Apr 27, 2020.  Compliance was suboptimal with only 60% of usage days.  However this is explained by the fact that her husband was in the hospital for several weeks and she was sleeping in the hospital in a recliner and not at home with her BiPAP unit.  Pressures are set at an EPAP of 10 and IPAP of 19 cm of water.  AHI is 2.7.  A new Epworth Sleepiness Scale score was calculated in the office today and this endorsed at 10.   During that evaluation I discussed the importance of optimal use between 7 and 8 hours per night.  She felt more rested when using BiPAP therapy.  I saw her in September 2021 at which time she denied any chest pain and was unaware of any recurrent episodes of atrial fibrillation. She has used BiPAP but she does not believe she needs to separate compact oxygen concentrator  since she has supplemental oxygen tanks at home.  The insurance company will not discontinue this unless they get my approval.  I obtained a download from July 7 through August 5.  She again is having suboptimal compliance with only 50% of usage days and usage average at 4 hours and 4 minutes.  At her 19/10 BiPAP pressure AHI, however is very good at 2.4.  Her tubing broke which explains her nonuse since July 09, 2020.  She had been contacting [employer] but was never able to get it a replacement hose.  She has not been successful with weight loss.   I last saw her on February 15, 2021.  At that time she was experiencing some shortness of breath intermittently particularly when walking up hills but actually  felt improved recently.  A download was obtained from February 9 through February 11, 2021 which shows 100% compliance.  Average use is 5 hours and 47 minutes.  She is on a pressure of 19/10.  AHI is 2.7/h.  [employer] is her DME company.  An Epworth sleepiness scale was calculated in the office today and this endorsed at 6 arguing against residual daytime sleepiness.  She has not been successful with weight loss.  During that evaluation I stressed the importance of using therapy for at least 7 to 8 hours per night.  I felt her diastolic heart failure may be contributing to some of her exertional dyspnea.  Since her last evaluation, she admits to losing approximately 30 pounds.  She continues to note some mild shortness of breath with walking.  [employer] is her DME company.  Unfortunately her mother died in 2022/04/22 and her husband died in 2023/05/24 at age 50  with lung cancer.  I obtained a download from November 7 through November 09, 2022.  Usage was 73% of days.  Average use was 6 hours and 42 minutes.  Her minimum EPAP pressure is set at 10 with pressure support of 9 and maximum IPAP of 25.  Her 95th percentile pressure was 19.6/10.8.  She was having significant mask leak.  AHI was 4.4.  She presents for evaluation.    Past Medical History:  Diagnosis Date   Essential hypertension    Fibroids    Hyperthyroidism    a. 2010, treated w/ tapazole.   Morbid obesity ([place])    Paroxysmal atrial fibrillation ([place])    a. 03/2009 - in setting of hyperthyroidism and caffeine intake, converted spontaneously;  b. 08/2016 ED visit for recurrent PAF-->successful DCCV in ED.   Snores    Type II diabetes mellitus ([facility])     Past Surgical History:  Procedure Laterality Date   UTERINE FIBROID SURGERY      Current Medications: Outpatient Medications Prior to Visit  Medication Sig Dispense Refill   apixaban (ELIQUIS) 5 MG TABS tablet Take 1 tablet (5 mg total) by mouth 2 (two) times daily. Pt is past due for appt with Dr Claiborne Billings.  Need to make appt before more refills can be given. 60 tablet 0   atorvastatin (LIPITOR) 20 MG tablet Take 1 tablet (20 mg total) by mouth daily. 30 tablet 2   Blood Glucose Monitoring Suppl (TRUE METRIX METER) w/Device KIT Use twice daily 1 kit 0   Dulaglutide (TRULICITY) 1.56 YO/3.7CH SOPN INJECT 1.5 MG INTO THE SKIN ONCE A WEEK. 2 mL 0   furosemide (LASIX) 20 MG tablet Take 1 tablet (20 mg total) by mouth daily. 90 tablet 0   gabapentin (NEURONTIN) 100 MG  capsule Take 1 capsule (100 mg total) by mouth at bedtime. 90 capsule 2   glipiZIDE (GLUCOTROL) 10 MG tablet Take 1 tablet (10 mg total) by mouth 2 (two) times daily before a meal. 180 tablet 1   glucose blood (TRUE METRIX BLOOD GLUCOSE TEST) test strip Use as instructed. Check blood glucose level by fingerstick twice per day. 200 each 12   metFORMIN (GLUCOPHAGE-XR) 500 MG 24 hr  tablet Take 4 tablets (2,000 mg total) by mouth at bedtime. 360 tablet 1   methimazole (TAPAZOLE) 5 MG tablet Take 0.5 tablets (2.5 mg total) by mouth daily. 45 tablet 0   methocarbamol (ROBAXIN) 500 MG tablet Take 1 tablet (500 mg total) by mouth every 6 (six) hours as needed for muscle spasms. 60 tablet 3   Methylsulfonylmethane (MSM PO) Take 2 tablets by mouth daily.     metoprolol succinate (TOPROL-XL) 50 MG 24 hr tablet Take 1 tablet (50 mg total) by mouth daily. 90 tablet 0   Multiple Vitamins-Minerals (ALIVE WOMENS 50+) TABS Take 1 tablet by mouth daily.     naproxen (NAPROSYN) 500 MG tablet Take 1 tablet (500 mg total) by mouth 2 (two) times daily as needed. 60 tablet 1   naproxen sodium (ALEVE) 220 MG tablet Take 220 mg by mouth daily as needed.     traZODone (DESYREL) 150 MG tablet Take 0.5-1 tablets (75-150 mg total) by mouth at bedtime. 90 tablet 1   TRUEplus Lancets 28G MISC Use twice daily for blood glucose check 200 each 6   No facility-administered medications prior to visit.     Allergies:   Patient has no known allergies.   Social History   Socioeconomic History   Marital status: Married    Spouse name: Not on file   Number of children: Not on file   Years of education: Not on file   Highest education level: Not on file  Occupational History   Occupation: cosmetologist  Tobacco Use   Smoking status: Former    Packs/day: 1.00    Years: 32.00    Total pack years: 32.00    Types: Cigarettes    Quit date: 2012    Years since quitting: 11.9   Smokeless tobacco: Never   Tobacco comments:    patient vapes  Vaping Use   Vaping Use: Never used  Substance and Sexual Activity   Alcohol use: No    Comment: occasional drink.   Drug use: No   Sexual activity: Yes  Other Topics Concern   Not on file  Social History Narrative   Lives in [place] with husband.  Systems analyst.  Also in school @ [employer] for [employer].  Does not routinely exercise.   Social  Determinants of Health   Financial Resource Strain: Not on file  Food Insecurity: No Food Insecurity (10/11/2022)   Hunger Vital Sign    Worried About Running Out of Food in the Last Year: Never true    Ran Out of Food in the Last Year: Never true  Transportation Needs: No Transportation Needs (10/11/2022)   PRAPARE - Hydrologist (Medical): No    Lack of Transportation (Non-Medical): No  Physical Activity: Not on file  Stress: Not on file  Social Connections: Not on file     Family History:  The patient's family history includes Cancer in her maternal grandmother; Diabetes Mellitus II in her sister; Hypertension in her mother.  Family history is notable in that her uncle  had sleep apnea but has not utilized treatment and he gave her his machine.  ROS General: Negative; No fevers, chills, or night sweats; morbid obesity HEENT: Negative; No changes in vision or hearing, sinus congestion, difficulty swallowing Pulmonary: Negative; No cough, wheezing, shortness of breath, hemoptysis Cardiovascular: History of PAF GI: Negative; No nausea, vomiting, diarrhea, or abdominal pain GU: Negative; No dysuria, hematuria, or difficulty voiding Musculoskeletal: Negative; no myalgias, joint pain, or weakness Hematologic/Oncology: Negative; no easy bruising, bleeding Endocrine: Positive for diabetes mellitus Neuro: Negative; no changes in balance, headaches Skin: Negative; No rashes or skin lesions Psychiatric: Negative; No behavioral problems, depression Sleep: Positive for severe sleep apnea; snoring, daytime sleepiness, hypersomnolence; now on BiPAP therapy.  No bruxism, restless legs, hypnogognic hallucinations, no cataplexy Other comprehensive 14 point system review is negative.   PHYSICAL EXAM:   VS:  BP (!) 124/58   Pulse 89   Ht _0  (1.702 m)   Wt 277 lb 6.4 oz (125.8 kg)   SpO2 91%   BMI 43.45 kg/m     Repeat blood pressure by me 122/64.  Wt Readings  from Last 3 Encounters:  11/17/22 277 lb 6.4 oz (125.8 kg)  10/11/22 279 lb 9.6 oz (126.8 kg)  07/10/22 280 lb (127 kg)    General: Alert, oriented, no distress.  Skin: normal turgor, no rashes, warm and dry HEENT: Normocephalic, atraumatic. Pupils equal round and reactive to light; sclera anicteric; extraocular muscles intact;  Nose without nasal septal hypertrophy Mouth/Parynx benign; Mallinpatti scale 4 Neck: Thick neck; no JVD, no carotid bruits; normal carotid upstroke Lungs: clear to ausculatation and percussion; no wheezing or rales Chest wall: without tenderness to palpitation Heart: PMI not displaced, RRR, s1 s2 normal, 1/6 systolic murmur, no diastolic murmur, no rubs, gallops, thrills, or heaves Abdomen: soft, nontender; no hepatosplenomehaly, BS+; abdominal aorta nontender and not dilated by palpation. Back: no CVA tenderness Pulses 2+ Musculoskeletal: full range of motion, normal strength, no joint deformities Extremities: no clubbing cyanosis or edema, Homan's sign negative  Neurologic: grossly nonfocal; Cranial nerves grossly wnl Psychologic: Normal mood and affect   Studies/Labs Reviewed:   November 17, 2022 ECG (independently read by me): NSR at 89; nonspecific T wave abnormality  August 13, 2020 ECG (independently read by me): NSR at 75, no ectopy, normal intervals  Apr 24, 2020  ECG (independently read by me): NSR at 84; no ectopy; normal intervals  I personally reviewed most recent ECG from December 22, 2019 which showed sinus rhythm at 69 bpm.  There were nonspecific ST changes.  August 05, 2019 ECG (independently read by me): Normal sinus rhythm at 83 bpm.  Probable left atrial enlargement.  QTc interval 432 ms PR interval 146 ms  ECG (independently read by me): Normal sinus rhythm at 84 bpm.  Normal intervals.  No ectopy  October 2018 ECG (independently read by me): Normal sinus rhythm at 73 bpm.  No ectopy.  Normal intervals.  Recent Labs:    Latest  Ref Rng & Units 07/10/2022    9:55 PM 05/23/2022    2:49 PM 02/15/2022    4:46 PM  BMP  Glucose 70 - 99 mg/dL 119  80  89   BUN 6 - 20 mg/dL _1 Creatinine 0.44 - 1.00 mg/dL 0.40  0.58  0.50   BUN/Creat Ratio 9 - _2 Sodium 135 - 145 mmol/L 145  143  144   Potassium 3.5 -  5.1 mmol/L 3.8  4.1  4.4   Chloride 98 - 111 mmol/L 109  103  100   CO2 22 - 32 mmol/L _0 Calcium 8.9 - 10.3 mg/dL 9.9  9.9  10.2         Latest Ref Rng & Units 07/10/2022    9:55 PM 05/23/2022    2:49 PM 05/27/2021    2:28 PM  Hepatic Function  Total Protein 6.5 - 8.1 g/dL 8.3  7.9  7.3   Albumin 3.5 - 5.0 g/dL 4.3  4.8  4.7   AST 15 - 41 U/L _1 ALT 0 - 44 U/L _2 Alk Phosphatase 38 - 126 U/L 46  60  61   Total Bilirubin 0.3 - 1.2 mg/dL 0.4  0.3  0.3        Latest Ref Rng & Units 07/10/2022    9:55 PM 10/15/2021   10:17 AM 11/24/2020    5:11 PM  CBC  WBC 4.0 - 10.5 K/uL 8.0  7.6  9.3   Hemoglobin 12.0 - 15.0 g/dL 13.4  13.4  13.7   Hematocrit 36.0 - 46.0 % 42.5  41.7  42.1   Platelets 150 - 400 K/uL 253  244  250    Lab Results  Component Value Date   MCV 96.4 07/10/2022   MCV 94 10/15/2021   MCV 94 11/24/2020   Lab Results  Component Value Date   TSH 1.01 03/31/2022   Lab Results  Component Value Date   HGBA1C 6.5 (H) 05/23/2022     BNP    Component Value Date/Time   BNP 63.9 02/15/2022 1646   BNP 205.8 (H) 12/21/2019 1427    ProBNP No results found for: "PROBNP"   Lipid Panel     Component Value Date/Time   CHOL 175 05/23/2022 1449   TRIG 75 05/23/2022 1449   HDL 58 05/23/2022 1449   CHOLHDL 3.0 05/23/2022 1449   CHOLHDL 3.5 01/25/2019 0850   VLDL 19 01/25/2019 0850   LDLCALC 103 (H) 05/23/2022 1449     RADIOLOGY: No results found.   Additional studies/ records that were reviewed today include:  I have reviewed the patient's prior ER evaluation for cardioversion, A. fib clinic note, and evaluation by Ignacia Bayley, NP  I  have reviewed her recent hospitalization from 1016 through December 23, 2019. New download was obtained. An Epworth Sleepiness Scale score was recalculated today 02/15/2021 which endorsed at 6 arguing against residual daytime sleepiness.   ASSESSMENT:    1. DOE (dyspnea on exertion)   2. OSA (obstructive sleep apnea)   3. Essential hypertension   4. Hyperlipidemia with target LDL less than 70   5. Chronic diastolic heart failure ([place])   6. Morbid obesity [facility])     PLAN:  Ms. Rylee Huestis is a 56 year old female who history of morbid obesity, hypertension, paroxysmal atrial fibrillation, type 2 diabetes mellitus, and was diagnosed as having very severe sleep apnea.  She failed CPAP therapy and required maximum BiPAP therapy at 25/21 for optimal treatment.  Initially, she never instituted BiPAP therapy due to lack of insurance.  At a prior evaluation she was  borrowing a friend's CPAP unit.  At that time she still had issues and was remaining sleepy felt as though she was not getting adequate air particularly in the middle of the night. She was admitted to the hospital in January  2021 with hypoxic respiratory failure felt secondary to acute diastolic heart failure.  She had not been using her CPAP and clearly was affected by her untreated sleep apnea.  During that evaluation she was also started on methimazole hyperthyroidism with TSH at 0.010.  Fortunately, she was evaluated by care manager and through the generosity of the heart and vascular found as an outpatient she was able to receive a new ResMed AirCurve BiPAP unit.  Her set up date was December 27, 2019.  When I saw her in February 2021 she was meeting compliance standards with reference to usage and has only had 1 day without usage.  She was set at a 18/10 pressure and a download revealed an AHI of 4.4 and central apnea of 1.4.  When I evaluated her in May 2021 I discussed the importance of improved compliance.  At a 19/10 pressure AHI was  2.7 and an Epworth scale endorsed at 10.  When last seen, she had issues with a cracked tube and during that evaluation I provided her new tubing so that she could reinstitute BiPAP therapy.  With reinstitution, she has felt significantly improved.  At her last evaluation with me on February 15, 2021, she was using therapy but uses duration remains suboptimal at only 5 hours and 47 minutes.  At a pressure of 19/10 AHI was 2.7.  She has been successful with weight loss since her prior evaluation and since March 2022 has lost 21 pounds.  Her blood pressure today is stable.  Her previous exertional shortness of breath with walking is now better.  Unfortunately her mother and husband both died this year.  I reviewed her most recent download which shows an AHI of 4.4 but she continues to have significant mask leak which is contributing to this AHI.  She has been using a ResMed F30 mask with the tubing coming to the front of her mask.  She is in need for a new mask.  [employer] is her DME company.  I commended her on her weight loss.  Her blood pressure today is stable on her regimen of furosemide 20 mg, metoprolol succinate 50 mg.  She is diabetic on glipizide, metformin, and Trulicity.  She continues to be on atorvastatin 20 mg daily for hyperlipidemia.  I am recommending follow-up echo Doppler study to reassess systolic and diastolic function.  I discussed improvement in her diet with continued weight loss since she is still morbidly obese with BMI 43.45.  I am checking laboratory with comprehensive metabolic panel, CBC, fasting lipid studies and will also check an LP(a).  I will see her in 6 months for reevaluation.   [name], MD,FACC, ABSM Diplomate, American Board of Sleep Medicine

## 2022-11-24 ENCOUNTER — Encounter: Payer: Self-pay | Admitting: Cardiovascular Disease

## 2022-12-15 ENCOUNTER — Other Ambulatory Visit: Payer: Self-pay | Admitting: Nurse Practitioner

## 2022-12-15 ENCOUNTER — Other Ambulatory Visit: Payer: Self-pay

## 2022-12-15 ENCOUNTER — Other Ambulatory Visit: Payer: Self-pay | Admitting: Family Medicine

## 2022-12-15 DIAGNOSIS — E1169 Type 2 diabetes mellitus with other specified complication: Secondary | ICD-10-CM

## 2022-12-15 DIAGNOSIS — I480 Paroxysmal atrial fibrillation: Secondary | ICD-10-CM

## 2022-12-15 MED ORDER — TRULICITY 1.5 MG/0.5ML ~~LOC~~ SOAJ
1.5000 mg | SUBCUTANEOUS | 1 refills | Status: DC
  Filled 2022-12-15: qty 2, 28d supply, fill #0
  Filled 2023-03-22: qty 2, 28d supply, fill #1

## 2022-12-15 MED ORDER — APIXABAN 5 MG PO TABS
5.0000 mg | ORAL_TABLET | Freq: Two times a day (BID) | ORAL | 0 refills | Status: DC
  Filled 2022-12-15: qty 20, 10d supply, fill #0

## 2022-12-16 ENCOUNTER — Other Ambulatory Visit: Payer: Self-pay

## 2022-12-16 DIAGNOSIS — E785 Hyperlipidemia, unspecified: Secondary | ICD-10-CM | POA: Diagnosis not present

## 2022-12-16 DIAGNOSIS — I10 Essential (primary) hypertension: Secondary | ICD-10-CM | POA: Diagnosis not present

## 2022-12-16 DIAGNOSIS — G4733 Obstructive sleep apnea (adult) (pediatric): Secondary | ICD-10-CM | POA: Diagnosis not present

## 2022-12-16 DIAGNOSIS — R0609 Other forms of dyspnea: Secondary | ICD-10-CM | POA: Diagnosis not present

## 2022-12-17 LAB — COMPREHENSIVE METABOLIC PANEL
ALT: 15 IU/L (ref 0–32)
AST: 17 IU/L (ref 0–40)
Albumin/Globulin Ratio: 1.3 (ref 1.2–2.2)
Albumin: 4.4 g/dL (ref 3.8–4.9)
Alkaline Phosphatase: 53 IU/L (ref 44–121)
BUN/Creatinine Ratio: 13 (ref 9–23)
BUN: 8 mg/dL (ref 6–24)
Bilirubin Total: 0.3 mg/dL (ref 0.0–1.2)
CO2: 26 mmol/L (ref 20–29)
Calcium: 10 mg/dL (ref 8.7–10.2)
Chloride: 104 mmol/L (ref 96–106)
Creatinine, Ser: 0.64 mg/dL (ref 0.57–1.00)
Globulin, Total: 3.3 g/dL (ref 1.5–4.5)
Glucose: 91 mg/dL (ref 70–99)
Potassium: 4.6 mmol/L (ref 3.5–5.2)
Sodium: 144 mmol/L (ref 134–144)
Total Protein: 7.7 g/dL (ref 6.0–8.5)
eGFR: 104 mL/min/{1.73_m2} (ref >59)

## 2022-12-17 LAB — CBC
Hematocrit: 38.1 % (ref 34.0–46.6)
Hemoglobin: 12 g/dL (ref 11.1–15.9)
MCH: 29.3 pg (ref 26.6–33.0)
MCHC: 31.5 g/dL (ref 31.5–35.7)
MCV: 93 fL (ref 79–97)
Platelets: 415 10*3/uL (ref 150–450)
RBC: 4.1 x10E6/uL (ref 3.77–5.28)
RDW: 12.3 % (ref 11.7–15.4)
WBC: 7.3 10*3/uL (ref 3.4–10.8)

## 2022-12-17 LAB — LIPID PANEL
Chol/HDL Ratio: 2.5 ratio (ref 0.0–4.4)
Cholesterol, Total: 101 mg/dL (ref 100–199)
HDL: 40 mg/dL (ref >39)
LDL Chol Calc (NIH): 47 mg/dL (ref 0–99)
Triglycerides: 65 mg/dL (ref 0–149)
VLDL Cholesterol Cal: 14 mg/dL (ref 5–40)

## 2022-12-19 ENCOUNTER — Ambulatory Visit: Payer: Medicaid Other | Admitting: Physician Assistant

## 2022-12-19 ENCOUNTER — Telehealth: Payer: Self-pay | Admitting: Cardiovascular Disease

## 2022-12-19 ENCOUNTER — Encounter: Payer: Self-pay | Admitting: Physician Assistant

## 2022-12-19 ENCOUNTER — Other Ambulatory Visit: Payer: Self-pay

## 2022-12-19 VITALS — BP 126/73 | HR 73 | Ht 67.0 in | Wt 269.0 lb

## 2022-12-19 DIAGNOSIS — E1169 Type 2 diabetes mellitus with other specified complication: Secondary | ICD-10-CM

## 2022-12-19 DIAGNOSIS — E0590 Thyrotoxicosis, unspecified without thyrotoxic crisis or storm: Secondary | ICD-10-CM

## 2022-12-19 DIAGNOSIS — Z6841 Body Mass Index (BMI) 40.0 and over, adult: Secondary | ICD-10-CM

## 2022-12-19 DIAGNOSIS — I480 Paroxysmal atrial fibrillation: Secondary | ICD-10-CM | POA: Diagnosis not present

## 2022-12-19 DIAGNOSIS — I5032 Chronic diastolic (congestive) heart failure: Secondary | ICD-10-CM | POA: Diagnosis not present

## 2022-12-19 LAB — POCT GLYCOSYLATED HEMOGLOBIN (HGB A1C): Hemoglobin A1C: 6.6 % — AB (ref 4.0–5.6)

## 2022-12-19 MED ORDER — TRULICITY 1.5 MG/0.5ML ~~LOC~~ SOAJ
1.5000 mg | SUBCUTANEOUS | 1 refills | Status: DC
  Filled 2023-01-24: qty 2, 28d supply, fill #0

## 2022-12-19 MED ORDER — APIXABAN 5 MG PO TABS
5.0000 mg | ORAL_TABLET | Freq: Two times a day (BID) | ORAL | 1 refills | Status: DC
  Filled 2022-12-19: qty 60, 30d supply, fill #0
  Filled 2023-01-15 – 2023-01-16 (×2): qty 60, 30d supply, fill #1

## 2022-12-19 NOTE — Telephone Encounter (Signed)
Attempted to call patient unable to reach- left message to call back.   Spoke with Haley [name] on the PCP mobile unit- who reports patient was in Afib today and reports short of breath. They are requesting patient be seen by Cardiology.  Reached out to Atrial Fib clinic for appointment. Patient is scheduled to see A-Fib clinic this Wednesday at 830am. Attempted to make patient aware. Will wait on return call.

## 2022-12-19 NOTE — Progress Notes (Unsigned)
Established Patient Office Visit  Subjective   Patient ID: Haley [name], female    DOB: 1966-03-13  Age: 57 y.o. MRN: [id number]  Chief Complaint  Patient presents with   Shortness of Breath    Patient  recently had an appointment with Heart doctor, Patient recently started fasting, started 1.2.24 Medication refills needed    Patient states that she has been experiencing some shortness of breath.  States this has been going on intermittently over the past week.  States that she tends to get shortness of breath when walking to and from her car.  States that she does not get short of breath at home.  States that she is using her CPAP machine.  States that she recently had an appointment with cardiology on 11/17/22.  Note from that visit:    PLAN: [name] is a 57 year old female who history of morbid obesity, hypertension, paroxysmal atrial fibrillation, type 2 diabetes mellitus, and was diagnosed as having very severe sleep apnea.  She failed CPAP therapy and required maximum BiPAP therapy at 25/21 for optimal treatment.  Initially, she never instituted BiPAP therapy due to lack of insurance.  At a prior evaluation she was  borrowing a friend's CPAP unit.  At that time she still had issues and was remaining sleepy felt as though she was not getting adequate air particularly in the middle of the night. She was admitted to the hospital in January 2021 with hypoxic respiratory failure felt secondary to acute diastolic heart failure.  She had not been using her CPAP and clearly was affected by her untreated sleep apnea.  During that evaluation she was also started on methimazole hyperthyroidism with TSH at 0.010.  Fortunately, she was evaluated by care manager and through the generosity of the heart and vascular found as an outpatient she was able to receive a new ResMed AirCurve BiPAP unit.  Her set up date was December 27, 2019.  When I saw her in February 2021 she was meeting  compliance standards with reference to usage and has only had 1 day without usage.  She was set at a 18/10 pressure and a download revealed an AHI of 4.4 and central apnea of 1.4.  When I evaluated her in May 2021 I discussed the importance of improved compliance.  At a 19/10 pressure AHI was 2.7 and an Epworth scale endorsed at 10.  When last seen, she had issues with a cracked tube and during that evaluation I provided her new tubing so that she could reinstitute BiPAP therapy.  With reinstitution, she has felt significantly improved.  At her last evaluation with me on February 15, 2021, she was using therapy but uses duration remains suboptimal at only 5 hours and 47 minutes.  At a pressure of 19/10 AHI was 2.7.  She has been successful with weight loss since her prior evaluation and since March 2022 has lost 21 pounds.  Her blood pressure today is stable.  Her previous exertional shortness of breath with walking is now better.  Unfortunately her mother and husband both died this year.  I reviewed her most recent download which shows an AHI of 4.4 but she continues to have significant mask leak which is contributing to this AHI.  She has been using a ResMed F30 mask with the tubing coming to the front of her mask.  She is in need for a new mask.  [employer] is her DME company.  I commended her on her weight loss.  Her blood pressure today is stable on her regimen of furosemide 20 mg, metoprolol succinate 50 mg.  She is diabetic on glipizide, metformin, and Trulicity.  She continues to be on atorvastatin 20 mg daily for hyperlipidemia.  I am recommending follow-up echo Doppler study to reassess systolic and diastolic function.  I discussed improvement in her diet with continued weight loss since she is still morbidly obese with BMI 43.45.  I am checking laboratory with comprehensive metabolic panel, CBC, fasting lipid studies and will also check an LP(a).  I will see her in 6 months for reevaluation.     [name],  MD,FACC, ABSM Diplomate, American Board of Sleep Medicine   States today that she has been taking her Eliquis, but will run out soon and has only been taking it once a day for the past week.  States that she has been having difficulty sleeping, states that she believes this is related to increased stress due to husband passing in June.  States that she was prescribed trazodone but felt the full dose was too much, does plan on trying a smaller dose of trazodone in the future.  States that she did start having palpitations which she felt was her going back into atrial fibrillation last night after drinking some cold chocolate milk  States that she has not taken medication for hyperthyroidism since October 2022.  Last visit with endocrinology was in April 2023.  Note from that visit :  ASSESSMENT: 1. Thyrotoxicosis   2.  Goiter   PLAN:  1. Patient with history of thyrotoxicosis with thyrotoxic symptoms including weight loss, heat intolerance, shortness of breath, weakness.  TSI's were elevated giving her a diagnosis of Graves' disease.  She was started on methimazole and we were able to reduce the dose gradually, currently on 2.5 mg daily (in 03/2021).  However, at this visit, she tells me that she is actually off the methimazole since 09/2021, after our last visit. -On this dose, her thyroid tests were great in 02/2022.  We will repeat these today to make sure that they do not worsen. -At today's visit, she has no signs of active Graves' ophthalmopathy: No blurry vision, double vision, eye pain, chemosis -She continues on Toprol XL, currently on 25 mg daily.  She takes this at night as it helps with her sleep. -At last visit she was on biotin, but she stopped since then. -I will see her back in a year, but I advised her to let me know if she develops new symptoms.   2.  Goiter -This is large and extended in the mediastinum.  On the chest x-ray from 12/20/2019, the trachea was slightly deviated  to the right -No significant neck compression symptoms, but she then mentioned that saliva was catching in her throat occasionally and also had some cough.  Symptoms improved after starting the CPAP and then switching to BiPAP.  At last visit, however, she complained about shortness of breath and fatigue.  She had oxygen desaturation with walking.  It was difficult to know whether her symptoms were related to thyroid compression or sleep apnea/deconditioning.  However, I felt that her breathing would improve after thyroidectomy and this would also correct her thyrotoxicosis.  We again discussed about the referral to Dr. Harlow Asa for thyroidectomy.  She saw him in 05/2021, however, she did not want to proceed with thyroidectomy -At last visit, I advised her to at least get another thyroid ultrasound to see if the thyroid gland was larger.  However, she did not have this done... At this visit, since she does not have any new symptoms, we decided to follow her without imaging.  I again advised her to let me know if she has any neck compression symptoms before her next visit.     12/19/2022    1:40 PM 05/23/2022    2:12 PM 08/23/2021   11:56 AM 02/17/2021    9:27 AM 11/24/2020    4:37 PM  Depression screen PHQ 2/9  Decreased Interest 1 1 0 0 0  Down, Depressed, Hopeless 1 0 0 0 0  PHQ - 2 Score 2 1 0 0 0  Altered sleeping 1 1 0 1 2  Tired, decreased energy '2 1 1 1 1  '$ Change in appetite 0 '2 1 1 1  '$ Feeling bad or failure about yourself  0 1 0 0 0  Trouble concentrating 0 1 0 0 0  Moving slowly or fidgety/restless 0 1 0 0 0  Suicidal thoughts 0 0 0 0 0  PHQ-9 Score '5 8 2 3 4  '$ Difficult doing work/chores Somewhat difficult  Not difficult at all  Somewhat difficult      12/19/2022    1:40 PM 05/23/2022    2:12 PM 08/23/2021   11:56 AM 02/17/2021    9:27 AM  GAD 7 : Generalized Anxiety Score  Nervous, Anxious, on Edge  1 0 0  Control/stop worrying 0 2 0 0  Worry too much - different things '1 2 1 '$ 0   Trouble relaxing '1 2 1 1  '$ Restless 1 1 0 0  Easily annoyed or irritable 0 1 0 0  Afraid - awful might happen 1 1 0 0  Total GAD 7 Score  '10 2 1  '$ Anxiety Difficulty Somewhat difficult  Not difficult at all      Past Medical History:  Diagnosis Date   Essential hypertension    Fibroids    Hyperthyroidism    a. 2010, treated w/ tapazole.   Morbid obesity ([place])    Paroxysmal atrial fibrillation ([place])    a. 03/2009 - in setting of hyperthyroidism and caffeine intake, converted spontaneously;  b. 08/2016 ED visit for recurrent PAF-->successful DCCV in ED.   Snores    Type II diabetes mellitus ([place])    Social History   Socioeconomic History   Marital status: Married    Spouse name: Not on file   Number of children: Not on file   Years of education: Not on file   Highest education level: Not on file  Occupational History   Occupation: cosmetologist  Tobacco Use   Smoking status: Former    Packs/day: 1.00    Years: 32.00    Total pack years: 32.00    Types: Cigarettes    Quit date: 2012    Years since quitting: 12.0   Smokeless tobacco: Never   Tobacco comments:    patient vapes  Vaping Use   Vaping Use: Never used  Substance and Sexual Activity   Alcohol use: No    Comment: occasional drink.   Drug use: No   Sexual activity: Yes  Other Topics Concern   Not on file  Social History Narrative   Lives in [place] with husband.  Systems analyst.  Also in school @ [employer] for [employer].  Does not routinely exercise.   Social Determinants of Health   Financial Resource Strain: Not on file  Food Insecurity: No Food Insecurity (10/11/2022)   Hunger Vital Sign  Worried About Charity fundraiser in the Last Year: Never true    [place] in the Last Year: Never true  Transportation Needs: No Transportation Needs (10/11/2022)   PRAPARE - Hydrologist (Medical): No    Lack of Transportation (Non-Medical): No  Physical Activity: Not on  file  Stress: Not on file  Social Connections: Not on file  Intimate Partner Violence: Not on file   Family History  Problem Relation Age of Onset   Hypertension Mother    Diabetes Mellitus II Sister    Cancer Maternal Grandmother    Breast cancer Neg Hx    No Known Allergies  Review of Systems  Constitutional: Negative.   HENT: Negative.    Eyes: Negative.   Respiratory:  Positive for shortness of breath.   Cardiovascular:  Positive for palpitations. Negative for chest pain.  Gastrointestinal: Negative.   Genitourinary: Negative.   Musculoskeletal: Negative.   Skin: Negative.   Neurological:  Negative for dizziness and headaches.  Endo/Heme/Allergies: Negative.   Psychiatric/Behavioral:  The patient has insomnia.       Objective:     Pulse 73   Ht '5\' 7"'$  (1.702 m)   Wt 269 lb (122 kg)   SpO2 92%   BMI 42.13 kg/m    Physical Exam Vitals and nursing note reviewed.  Constitutional:      Appearance: Normal appearance. She is obese.  HENT:     Head: Normocephalic and atraumatic.     Right Ear: External ear normal.     Left Ear: External ear normal.     Nose: Nose normal.     Mouth/Throat:     Mouth: Mucous membranes are moist.     Pharynx: Oropharynx is clear.  Eyes:     Extraocular Movements: Extraocular movements intact.     Conjunctiva/sclera: Conjunctivae normal.     Pupils: Pupils are equal, round, and reactive to light.  Cardiovascular:     Rate and Rhythm: Rhythm irregular.  Pulmonary:     Effort: Pulmonary effort is normal.     Breath sounds: Normal breath sounds.  Musculoskeletal:        General: Normal range of motion.     Cervical back: Normal range of motion and neck supple.  Skin:    General: Skin is warm and dry.  Neurological:     General: No focal deficit present.     Mental Status: She is alert and oriented to person, place, and time.  Psychiatric:        Mood and Affect: Mood normal.        Behavior: Behavior normal.        Thought  Content: Thought content normal.        Judgment: Judgment normal.         Assessment & Plan:   Problem List Items Addressed This Visit       Cardiovascular and Mediastinum   Paroxysmal atrial fibrillation (HCC) - Primary   Relevant Medications   apixaban (ELIQUIS) 5 MG TABS tablet   Chronic diastolic CHF (congestive heart failure) (HCC)   Relevant Medications   apixaban (ELIQUIS) 5 MG TABS tablet     Endocrine   Type II diabetes mellitus (HCC)   Relevant Medications   Dulaglutide (TRULICITY) 1.5 SN/0.5LZ SOPN   Other Relevant Orders   Microalbumin / creatinine urine ratio   POCT glycosylated hemoglobin (Hb A1C) (Completed)   Hyperthyroidism   Relevant Orders  Thyroid Panel With TSH (Completed)   Other Visit Diagnoses     Class 3 severe obesity due to excess calories with serious comorbidity and body mass index (BMI) of 40.0 to 44.9 in adult [facility])       Relevant Medications   Dulaglutide (TRULICITY) 1.5 HA/1.9FX SOPN      1. Paroxysmal atrial fibrillation [facility]) Patient will schedule an appointment with cardiology for prompt follow-up on Wednesday, December 21, 2022.  Continue Eliquis, strongly encouraged medication adherence.  Red flags given for prompt reevaluation - apixaban (ELIQUIS) 5 MG TABS tablet; Take 1 tablet (5 mg total) by mouth 2 (two) times daily.  Dispense: 60 tablet; Refill: 1  2. Type 2 diabetes mellitus with other specified complication, without long-term current use of insulin (HCC) A1c 6.6.  Continue current regimen - Dulaglutide (TRULICITY) 1.5 TK/2.4OX SOPN; INJECT 1.5 MG INTO THE SKIN ONCE A WEEK.  Dispense: 2 mL; Refill: 1 - Microalbumin / creatinine urine ratio - POCT glycosylated hemoglobin (Hb A1C)  3. Hyperthyroidism   - Thyroid Panel With TSH  4. Chronic diastolic CHF (congestive heart failure) (HCC)   5. Class 3 severe obesity due to excess calories with serious comorbidity and body mass index (BMI) of 40.0 to 44.9 in adult  [facility])    I have reviewed the patient's medical history (PMH, PSH, Social History, Family History, Medications, and allergies) , and have been updated if relevant. I spent 40 minutes reviewing chart and  face to face time with patient.    Return if symptoms worsen or fail to improve.    Loraine Grip Mayers, PA-C

## 2022-12-19 NOTE — Telephone Encounter (Signed)
Spoke with Benjamine Mola who stated that she was calling in from the mobile clinic. Benjamine Mola reports that the patient presented to them in afib/SOB today. HR was 73. Benjamine Mola states their provider would like to get the patient in as soon as possible for cardiology workup due to afib/irregular HR. Please contact the patient to make arrangements. If questions, a call may be returned to the mobile unit at [phone] to discuss.

## 2022-12-19 NOTE — Patient Instructions (Signed)
We will call you with your lab results as soon as they are available.  I strongly encourage you to make sure that you are taking the Eliquis twice a day, follow-up with cardiology as soon as possible.  Kennieth Rad, PA-C Physician Assistant [facility] [email or web address]   Atrial Fibrillation  Atrial fibrillation is a type of irregular or rapid heartbeat (arrhythmia). In atrial fibrillation, the top part of the heart (atria) beats in an irregular pattern. This makes the heart unable to pump blood normally and effectively. The goal of treatment is to prevent blood clots from forming, control your heart rate, or restore your heartbeat to a normal rhythm. If this condition is not treated, it can cause serious problems, such as a weakened heart muscle (cardiomyopathy) or a stroke. What are the causes? This condition is often caused by medical conditions that damage the heart's electrical system. These include: High blood pressure (hypertension). This is the most common cause. Certain heart problems or conditions, such as heart failure, coronary artery disease, heart valve problems, or heart surgery. Diabetes. Overactive thyroid (hyperthyroidism). Obesity. Chronic kidney disease. In some cases, the cause of this condition is not known. What increases the risk? This condition is more likely to develop in: Older people. People who smoke. Athletes who do endurance exercise. People who have a family history of atrial fibrillation. Men. People who use drugs. People who drink a lot of alcohol. People who have lung conditions, such as emphysema, pneumonia, or COPD. People who have obstructive sleep apnea. What are the signs or symptoms? Symptoms of this condition include: A feeling that your heart is racing or beating irregularly. Discomfort or pain in your chest. Shortness of breath. Sudden light-headedness or weakness. Tiring  easily during exercise or activity. Fatigue. Syncope (fainting). Sweating. In some cases, there are no symptoms. How is this diagnosed? Your health care provider may detect atrial fibrillation when taking your pulse. If detected, this condition may be diagnosed with: An electrocardiogram (ECG) to check electrical signals of the heart. An ambulatory cardiac monitor to record your heart's activity for a few days. A transthoracic echocardiogram (TTE) to create pictures of your heart. A transesophageal echocardiogram (TEE) to create even closer pictures of your heart. A stress test to check your blood supply while you exercise. Imaging tests, such as a CT scan or chest X-ray. Blood tests. How is this treated? Treatment depends on underlying conditions and how you feel when you experience atrial fibrillation. This condition may be treated with: Medicines to prevent blood clots or to treat heart rate or heart rhythm problems. Electrical cardioversion to reset the heart's rhythm. A pacemaker to correct abnormal heart rhythm. Ablation to remove the heart tissue that sends abnormal signals. Left atrial appendage closure to seal the area where blood clots can form. In some cases, underlying conditions will be treated. Follow these instructions at home: Medicines Take over-the counter and prescription medicines only as told by your health care provider. Do not take any new medicines without talking to your health care provider. If you are taking blood thinners: Talk with your health care provider before you take any medicines that contain aspirin or NSAIDs, such as ibuprofen. These medicines increase your risk for dangerous bleeding. Take your medicine exactly as told, at the same time every day. Avoid activities that could cause injury or bruising, and follow instructions about how to prevent falls. Wear a medical alert bracelet or carry a card that lists what medicines you take. Lifestyle  Do not use any products that contain nicotine or tobacco, such as cigarettes, e-cigarettes, and chewing tobacco. If you need help quitting, ask your health care provider. Eat heart-healthy foods. Talk with a dietitian to make an eating plan that is right for you. Exercise regularly as told by your health care provider. Do not drink alcohol. Lose weight if you are overweight. Do not use drugs, including cannabis. General instructions If you have obstructive sleep apnea, manage your condition as told by your health care provider. Do not use diet pills unless your health care provider approves. Diet pills can make heart problems worse. Keep all follow-up visits as told by your health care provider. This is important. Contact a health care provider if you: Notice a change in the rate, rhythm, or strength of your heartbeat. Are taking a blood thinner and you notice more bruising. Tire more easily when you exercise or do heavy work. Have a sudden change in weight. Get help right away if you have:  Chest pain, abdominal pain, sweating, or weakness. Trouble breathing. Side effects of blood thinners, such as blood in your vomit, stool, or urine, or bleeding that cannot stop. Any symptoms of a stroke. "BE FAST" is an easy way to remember the main warning signs of a stroke: B - Balance. Signs are dizziness, sudden trouble walking, or loss of balance. E - Eyes. Signs are trouble seeing or a sudden change in vision. F - Face. Signs are sudden weakness or numbness of the face, or the face or eyelid drooping on one side. A - Arms. Signs are weakness or numbness in an arm. This happens suddenly and usually on one side of the body. S - Speech. Signs are sudden trouble speaking, slurred speech, or trouble understanding what people say. T - Time. Time to call emergency services. Write down what time symptoms started. Other signs of a stroke, such as: A sudden, severe headache with no known  cause. Nausea or vomiting. Seizure. These symptoms may represent a serious problem that is an emergency. Do not wait to see if the symptoms will go away. Get medical help right away. Call your local emergency services (911 in the [place] Do not drive yourself to the hospital. Summary Atrial fibrillation is a type of irregular or rapid heartbeat (arrhythmia). Symptoms include a feeling that your heart is beating fast or irregularly. You may be given medicines to prevent blood clots or to treat heart rate or heart rhythm problems. Get help right away if you have signs or symptoms of a stroke. Get help right away if you cannot catch your breath or have chest pain or pressure. This information is not intended to replace advice given to you by your health care provider. Make sure you discuss any questions you have with your health care provider. Document Revised: 05/15/2019 Document Reviewed: 05/15/2019 Elsevier Patient Education  [employer].

## 2022-12-20 ENCOUNTER — Encounter: Payer: Self-pay | Admitting: Physician Assistant

## 2022-12-20 ENCOUNTER — Other Ambulatory Visit: Payer: Self-pay

## 2022-12-20 LAB — MICROALBUMIN / CREATININE URINE RATIO
Creatinine, Urine: 109.7 mg/dL
Microalb/Creat Ratio: 72 mg/g creat — ABNORMAL HIGH (ref 0–29)
Microalbumin, Urine: 78.5 ug/mL

## 2022-12-20 LAB — THYROID PANEL WITH TSH
Free Thyroxine Index: 3.9 (ref 1.2–4.9)
T3 Uptake Ratio: 40 % — ABNORMAL HIGH (ref 24–39)
T4, Total: 9.8 ug/dL (ref 4.5–12.0)
TSH: <0.005 u[IU]/mL — ABNORMAL LOW (ref 0.450–4.500)

## 2022-12-20 MED ORDER — METHIMAZOLE 5 MG PO TABS
5.0000 mg | ORAL_TABLET | Freq: Every day | ORAL | 1 refills | Status: DC
  Filled 2022-12-20: qty 30, 30d supply, fill #0
  Filled 2023-01-16: qty 30, 30d supply, fill #1

## 2022-12-20 NOTE — Addendum Note (Signed)
Addended by: Kennieth Rad on: 12/20/2022 09:51 AM   Modules accepted: Orders

## 2022-12-20 NOTE — Telephone Encounter (Signed)
Attempted to reach Haley [name] again to verify she had received her appointment information left on her VM by cardiology. Unable to reach left message with appointment information.

## 2022-12-21 ENCOUNTER — Ambulatory Visit (HOSPITAL_COMMUNITY): Payer: Medicaid Other | Admitting: Nurse Practitioner

## 2022-12-21 ENCOUNTER — Other Ambulatory Visit: Payer: Self-pay

## 2022-12-21 MED ORDER — LISINOPRIL 2.5 MG PO TABS
2.5000 mg | ORAL_TABLET | Freq: Every day | ORAL | 1 refills | Status: DC
  Filled 2022-12-21 – 2023-02-17 (×2): qty 30, 30d supply, fill #0
  Filled 2023-03-22: qty 30, 30d supply, fill #1

## 2022-12-21 NOTE — Addendum Note (Signed)
Addended by: Kennieth Rad on: 12/21/2022 10:00 AM   Modules accepted: Orders

## 2022-12-23 ENCOUNTER — Ambulatory Visit (HOSPITAL_COMMUNITY): Payer: Medicaid Other | Admitting: Physician Assistant

## 2022-12-26 ENCOUNTER — Ambulatory Visit (HOSPITAL_COMMUNITY): Payer: Medicaid Other | Admitting: Physician Assistant

## 2022-12-26 ENCOUNTER — Encounter (HOSPITAL_COMMUNITY): Payer: Self-pay

## 2022-12-26 ENCOUNTER — Encounter (HOSPITAL_COMMUNITY): Payer: Self-pay | Admitting: Cardiovascular Disease

## 2022-12-26 ENCOUNTER — Other Ambulatory Visit (HOSPITAL_COMMUNITY): Payer: No Typology Code available for payment source

## 2022-12-28 ENCOUNTER — Other Ambulatory Visit: Payer: Self-pay

## 2023-01-12 ENCOUNTER — Encounter (HOSPITAL_COMMUNITY): Payer: Self-pay | Admitting: *Deleted

## 2023-01-16 ENCOUNTER — Other Ambulatory Visit: Payer: Self-pay

## 2023-01-16 ENCOUNTER — Other Ambulatory Visit: Payer: Self-pay | Admitting: Family Medicine

## 2023-01-17 ENCOUNTER — Other Ambulatory Visit: Payer: Self-pay

## 2023-01-17 MED ORDER — FUROSEMIDE 20 MG PO TABS
20.0000 mg | ORAL_TABLET | Freq: Every day | ORAL | 0 refills | Status: DC
  Filled 2023-01-17 – 2023-02-17 (×2): qty 30, 30d supply, fill #0

## 2023-01-17 NOTE — Telephone Encounter (Signed)
Courtesy refill. Patient will need an office visit for further refills. Requested Prescriptions  Pending Prescriptions Disp Refills   furosemide (LASIX) 20 MG tablet 30 tablet 0    Sig: Take 1 tablet (20 mg total) by mouth daily.     Cardiovascular:  Diuretics - Loop Failed - 01/17/2023 12:42 PM      Failed - Mg Level in normal range and within 180 days    Magnesium  Date Value Ref Range Status  12/24/2019 1.9 1.7 - 2.4 mg/dL Final    Comment:    Performed at [facility], [place] [address], [place], [place] [address]         Failed - Valid encounter within last 6 months    Recent Outpatient Visits           7 months ago Insomnia secondary to situational depression   [place], NP   7 months ago Chronic pain of both [place], [place], NP   1 year ago Essential hypertension   [place], NP   1 year ago Graves disease   [place], [place], [place]   1 year ago Essential hypertension   [place], [place] W, NP              Passed - K in normal range and within 180 days    Potassium  Date Value Ref Range Status  12/16/2022 4.6 3.5 - 5.2 mmol/L Final         Passed - Ca in normal range and within 180 days    Calcium  Date Value Ref Range Status  12/16/2022 10.0 8.7 - 10.2 mg/dL Final   Calcium, Ion  Date Value Ref Range Status  03/14/2009 1.16 1.12 - 1.32 mmol/L Final         Passed - Na in normal range and within 180 days    Sodium  Date Value Ref Range Status  12/16/2022 144 134 - 144 mmol/L Final         Passed - Cr in normal range and within 180 days    Creatinine, Ser  Date Value Ref Range Status  12/16/2022 0.64 0.57 - 1.00 mg/dL Final         Passed - Cl in normal range and within 180 days    Chloride   Date Value Ref Range Status  12/16/2022 104 96 - 106 mmol/L Final         Passed - Last BP in normal range    BP Readings from Last 1 Encounters:  12/19/22 126/73

## 2023-01-17 NOTE — Telephone Encounter (Signed)
Called pt [id number] for office visit.

## 2023-01-24 ENCOUNTER — Other Ambulatory Visit: Payer: Self-pay

## 2023-01-25 ENCOUNTER — Other Ambulatory Visit: Payer: Self-pay

## 2023-01-27 ENCOUNTER — Other Ambulatory Visit: Payer: Self-pay

## 2023-01-27 ENCOUNTER — Ambulatory Visit: Payer: Self-pay | Admitting: *Deleted

## 2023-01-27 NOTE — Telephone Encounter (Signed)
Attempted to return her call.   Left a voicemail to call back.

## 2023-01-27 NOTE — Telephone Encounter (Signed)
Message from [place] sent at 01/27/2023 11:40 AM EST  Summary: Pt requests that a nurse return her call to discuss one of her medications   Pt requests that a nurse return her call to discuss one of her medications. Pt was very vague and did not provide any other details. Cb# [id number]          Call History   Type Contact Phone/Fax User  01/27/2023 11:34 AM EST Phone (Incoming) [name], Haley Schor (Self) [phone] Jerilynn Mages) Leonides Schanz, [place]

## 2023-01-30 ENCOUNTER — Telehealth: Payer: Self-pay | Admitting: Emergency Medicine

## 2023-01-30 ENCOUNTER — Other Ambulatory Visit: Payer: Self-pay | Admitting: Nurse Practitioner

## 2023-01-30 ENCOUNTER — Other Ambulatory Visit: Payer: Self-pay

## 2023-01-30 DIAGNOSIS — I10 Essential (primary) hypertension: Secondary | ICD-10-CM

## 2023-01-30 NOTE — Telephone Encounter (Signed)
Copied from [place] [id number]. Topic: General - Other >> Jan 30, 2023 10:42 AM Oley Balm A wrote: Reason for CRM: Pt is wanting to speak with her PCP. Could you please have PCP call pt back.

## 2023-01-30 NOTE — Addendum Note (Signed)
Addended by: Romana Juniper on: 01/30/2023 03:54 PM   Modules accepted: Orders

## 2023-01-30 NOTE — Telephone Encounter (Signed)
Copied from [place] [id number]. Topic: General - Other >> Jan 30, 2023 10:42 AM Oley Balm A wrote: Reason for CRM: Pt is wanting to speak with her PCP. Could you please have PCP call pt back.

## 2023-01-30 NOTE — Telephone Encounter (Signed)
Please call patient. Thanks.

## 2023-01-30 NOTE — Telephone Encounter (Signed)
Medication Refill - Medication: metoprolol succinate (TOPROL-XL) 50 MG 24 hr tablet [id number]    Has the patient contacted their pharmacy? No. (Agent: If no, request that the patient contact the pharmacy for the refill. If patient does not wish to contact the pharmacy document the reason why and proceed with request.) (Agent: If yes, when and what did the pharmacy advise?) No refills  Preferred Pharmacy (with phone number or street name): [facility]  Has the patient been seen for an appointment in the last year OR does the patient have an upcoming appointment? No.  Agent: Please be advised that RX refills may take up to 3 business days. We ask that you follow-up with your pharmacy.

## 2023-01-31 ENCOUNTER — Other Ambulatory Visit: Payer: Self-pay

## 2023-01-31 ENCOUNTER — Other Ambulatory Visit: Payer: Self-pay | Admitting: Nurse Practitioner

## 2023-01-31 NOTE — Telephone Encounter (Signed)
Requested medication (s) are due for refill today - yes  Requested medication (s) are on the active medication list -yes  Future visit scheduled -no  Last refill: 09/26/22 #90  Notes to clinic: Courtesy RF given- last RF has notes  Requested Prescriptions  Pending Prescriptions Disp Refills   metoprolol succinate (TOPROL-XL) 50 MG 24 hr tablet 90 tablet 0    Sig: Take 1 tablet (50 mg total) by mouth daily.     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

## 2023-01-31 NOTE — Telephone Encounter (Signed)
Unable to reach patient by phone to relay results. Unable to leave a voicemail to return call.

## 2023-01-31 NOTE — Telephone Encounter (Addendum)
Patient returned call to [place] while office was at lunch.

## 2023-01-31 NOTE — Telephone Encounter (Signed)
Refill of metoprolol sent for 30 days and patient made aware that future refills need to go through the cardiologist or from the Afib clinic. Patient aware that she can follow up through [facility] for future appointments with cardiology if they can't reached by phone  .    Patient also is stating that the pharmacy is out of Tr[place] and is looking for an alternative if possible.

## 2023-02-01 NOTE — Addendum Note (Signed)
Addended by: Daisy Blossom, Annie Main L on: 02/01/2023 12:08 PM   Modules accepted: Orders

## 2023-02-02 ENCOUNTER — Other Ambulatory Visit: Payer: Self-pay | Admitting: Cardiovascular Disease

## 2023-02-02 ENCOUNTER — Telehealth: Payer: Self-pay | Admitting: *Deleted

## 2023-02-02 ENCOUNTER — Other Ambulatory Visit: Payer: Self-pay

## 2023-02-02 DIAGNOSIS — I10 Essential (primary) hypertension: Secondary | ICD-10-CM

## 2023-02-02 MED ORDER — METOPROLOL SUCCINATE ER 50 MG PO TB24
50.0000 mg | ORAL_TABLET | Freq: Every day | ORAL | 0 refills | Status: DC
  Filled 2023-02-02 (×2): qty 90, 90d supply, fill #0

## 2023-02-02 NOTE — Telephone Encounter (Signed)
Patient called in to request refill on her metoprolol 50 mg. Refill sent to [facility] on [address].

## 2023-02-06 ENCOUNTER — Other Ambulatory Visit: Payer: Self-pay

## 2023-02-06 NOTE — Progress Notes (Signed)
Patient seen by Wallene Huh, PharmD Candidate on 02/06/23 while they were picking up prescriptions at [place] [facility].   Blood pressure today was : 156/80, HR 75; on recheck: 147/78, HR 75   Patient does not have an automated home blood pressure machine.   Medication review was performed. They are taking medications as prescribed.   The following barriers to adherence were noted:  - They do not have cost concerns.  - They do not have transportation concerns.  - They do not need assistance obtaining refills.  - They do not occasionally forget to take some of their prescribed medications.  - They do not feel like one/some of their medications make them feel poorly.  - They do not have questions or concerns about their medications.  - They do not have follow up scheduled with their primary care provider/cardiologist. Patient reports attempting to make an appointment with primary care and Afib clinic.   The following interventions were completed:  - Medications were reviewed  - Patient was educated on goal blood pressures and long term health implications of elevated blood pressure  - Patient was educated on how to access home blood pressure machine   The patient has follow up scheduled:  PCP: Had appointment 12/19/2022. Patient is reaching out to schedule next appointment.    Haley [name], PharmD Candidate   Joseph Art, Pharm.D. PGY-2 Ambulatory Care Pharmacy Resident

## 2023-02-10 ENCOUNTER — Other Ambulatory Visit: Payer: Self-pay

## 2023-02-10 ENCOUNTER — Telehealth: Payer: Self-pay | Admitting: Nurse Practitioner

## 2023-02-10 NOTE — Telephone Encounter (Signed)
Needs a virtual visit to discuss knee pain

## 2023-02-10 NOTE — Telephone Encounter (Signed)
Referral Request - Has patient seen PCP for this complaint? yes *If NO, is insurance requiring patient see PCP for this issue before PCP can refer them? Referral for which specialty: orthopaedic Preferred provider/office: ? Reason for referral: pain in knees

## 2023-02-13 NOTE — Telephone Encounter (Signed)
Appointment scheduled.

## 2023-02-15 ENCOUNTER — Ambulatory Visit: Payer: Medicaid Other | Attending: Nurse Practitioner | Admitting: Nurse Practitioner

## 2023-02-15 ENCOUNTER — Encounter: Payer: Self-pay | Admitting: Nurse Practitioner

## 2023-02-15 ENCOUNTER — Other Ambulatory Visit: Payer: Self-pay

## 2023-02-15 DIAGNOSIS — M25561 Pain in right knee: Secondary | ICD-10-CM | POA: Diagnosis not present

## 2023-02-15 DIAGNOSIS — G8929 Other chronic pain: Secondary | ICD-10-CM

## 2023-02-15 DIAGNOSIS — M25562 Pain in left knee: Secondary | ICD-10-CM | POA: Diagnosis not present

## 2023-02-15 DIAGNOSIS — E1169 Type 2 diabetes mellitus with other specified complication: Secondary | ICD-10-CM | POA: Diagnosis not present

## 2023-02-15 MED ORDER — TRULICITY 1.5 MG/0.5ML ~~LOC~~ SOAJ
1.5000 mg | SUBCUTANEOUS | 0 refills | Status: DC
  Filled 2023-02-15: qty 6, 84d supply, fill #0
  Filled 2023-02-20: qty 2, 28d supply, fill #0
  Filled 2023-03-22 – 2023-05-08 (×4): qty 2, 28d supply, fill #1

## 2023-02-15 NOTE — Progress Notes (Signed)
Virtual Visit via Telephone Note  I discussed the limitations, risks, security and privacy concerns of performing an evaluation and management service by telephone and the availability of in person appointments. I also discussed with the patient that there may be a patient responsible charge related to this service. The patient expressed understanding and agreed to proceed.    I connected with Haley [name] on 02/15/23  at   1:30 PM EDT  EDT by telephone and verified that I am speaking with the correct person using two identifiers.  Location of Patient: Private Residence   Location of Provider: [place] participating in Telemedicine visit: Haley [name] Haley [name]    History of Present Illness: Telemedicine visit for: Bilateral knee pain  R>L knee pain. Pain onset several years ago. Treatments tried: Tylenol arthritis, copper knee sleeve. Aggravating factors: any activity.  Pain described as: throbbing, sharp.  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 Associated symptoms: swelling. Knee pain is unrelated to any injury or trauma.      Past Medical History:  Diagnosis Date   Essential hypertension    Fibroids    Hyperthyroidism    a. 2010, treated w/ tapazole.   Morbid obesity ([place])    Paroxysmal atrial fibrillation ([place])    a. 03/2009 - in setting of hyperthyroidism and caffeine intake, converted spontaneously;  b. 08/2016 ED visit for recurrent PAF-->successful DCCV in ED.   Snores    Type II diabetes mellitus (HCC)     Past Surgical History:  Procedure Laterality Date   UTERINE FIBROID SURGERY      Family History  Problem Relation Age of Onset   Hypertension Mother    Diabetes Mellitus II Sister    Cancer Maternal Grandmother    Breast cancer Neg Hx     Social History   Socioeconomic History   Marital status: Married    Spouse name: Not on  file   Number of children: Not on file   Years of education: Not on file   Highest education level: Not on file  Occupational History   Occupation: cosmetologist  Tobacco Use   Smoking status: Former    Packs/day: 1.00    Years: 32.00    Total pack years: 32.00    Types: Cigarettes    Quit date: 2012    Years since quitting: 12.2   Smokeless tobacco: Never   Tobacco comments:    patient vapes  Vaping Use   Vaping Use: Never used  Substance and Sexual Activity   Alcohol use: No    Comment: occasional drink.   Drug use: No   Sexual activity: Yes  Other Topics Concern   Not on file  Social History Narrative   Lives in [place] with husband.  Systems analyst.  Also in school @ [employer] for [employer].  Does not routinely exercise.   Social Determinants of Health   Financial Resource Strain: Not on file  Food Insecurity: No Food Insecurity (10/11/2022)   Hunger Vital Sign    Worried About Running Out of Food in the Last Year: Never true    Ran Out of Food in the Last Year: Never true  Transportation Needs: No Transportation Needs (10/11/2022)   PRAPARE - Hydrologist (Medical): No    Lack of Transportation (Non-Medical): No  Physical Activity: Not on file  Stress: Not on file  Social Connections: Not on file     Observations/Objective: Awake, alert and oriented x 3   Review of Systems  Constitutional:  Negative for fever, malaise/fatigue and weight loss.  HENT: Negative.  Negative for nosebleeds.   Eyes: Negative.  Negative for blurred vision, double vision and photophobia.  Respiratory: Negative.  Negative for cough and shortness of breath.   Cardiovascular: Negative.  Negative for chest pain, palpitations and leg swelling.  Gastrointestinal: Negative.  Negative for heartburn, nausea and vomiting.  Musculoskeletal:  Positive for joint pain. Negative for myalgias.  Neurological: Negative.  Negative for dizziness, focal weakness, seizures  and headaches.  Psychiatric/Behavioral: Negative.  Negative for suicidal ideas.    Assessment and Plan: Diagnoses and all orders for this visit:  Chronic pain of both knees -     DG Knee Complete 4 Views Right; Future -     DG Knee Complete 4 Views Left; Future -     Ambulatory referral to Orthopedics Work on losing weight to help reduce joint pain. May alternate with heat and ice application for pain relief. May also alternate with acetaminophen and Ibuprofen as prescribed pain relief. Other alternatives include massage, acupuncture and water aerobics.     Follow Up Instructions Return if symptoms worsen or fail to improve.     I discussed the assessment and treatment plan with the patient. The patient was provided an opportunity to ask questions and all were answered. The patient agreed with the plan and demonstrated an understanding of the instructions.   The patient was advised to call back or seek an in-person evaluation if the symptoms worsen or if the condition fails to improve as anticipated.  I provided 12 minutes of non-face-to-face time during this encounter including median intraservice time, reviewing previous notes, labs, imaging, medications and explaining diagnosis and management.  [name], [name]

## 2023-02-17 ENCOUNTER — Other Ambulatory Visit: Payer: Self-pay | Admitting: Physician Assistant

## 2023-02-17 ENCOUNTER — Other Ambulatory Visit: Payer: Self-pay | Admitting: Nurse Practitioner

## 2023-02-17 ENCOUNTER — Other Ambulatory Visit (HOSPITAL_COMMUNITY): Payer: Self-pay

## 2023-02-17 ENCOUNTER — Other Ambulatory Visit: Payer: Self-pay

## 2023-02-17 DIAGNOSIS — E7800 Pure hypercholesterolemia, unspecified: Secondary | ICD-10-CM

## 2023-02-17 DIAGNOSIS — I480 Paroxysmal atrial fibrillation: Secondary | ICD-10-CM

## 2023-02-17 DIAGNOSIS — E1169 Type 2 diabetes mellitus with other specified complication: Secondary | ICD-10-CM

## 2023-02-17 DIAGNOSIS — E0590 Thyrotoxicosis, unspecified without thyrotoxic crisis or storm: Secondary | ICD-10-CM

## 2023-02-17 MED ORDER — METFORMIN HCL ER 500 MG PO TB24
2000.0000 mg | ORAL_TABLET | Freq: Every day | ORAL | 1 refills | Status: DC
  Filled 2023-02-17: qty 360, 90d supply, fill #0
  Filled 2023-03-22 – 2023-06-12 (×3): qty 360, 90d supply, fill #1

## 2023-02-17 MED ORDER — GLIPIZIDE 10 MG PO TABS
10.0000 mg | ORAL_TABLET | Freq: Two times a day (BID) | ORAL | 1 refills | Status: DC
  Filled 2023-02-17: qty 180, 90d supply, fill #0
  Filled 2023-03-22 – 2023-06-12 (×3): qty 180, 90d supply, fill #1

## 2023-02-17 MED ORDER — ATORVASTATIN CALCIUM 20 MG PO TABS
20.0000 mg | ORAL_TABLET | Freq: Every day | ORAL | 1 refills | Status: DC
  Filled 2023-02-17: qty 90, 90d supply, fill #0
  Filled 2023-03-22 – 2023-06-12 (×3): qty 90, 90d supply, fill #1

## 2023-02-17 NOTE — Telephone Encounter (Signed)
Requested Prescriptions  Pending Prescriptions Disp Refills   metFORMIN (GLUCOPHAGE-XR) 500 MG 24 hr tablet 360 tablet 1    Sig: Take 4 tablets (2,000 mg total) by mouth at bedtime.     Endocrinology:  Diabetes - Biguanides Failed - 02/17/2023  5:04 PM      Failed - B12 Level in normal range and within 720 days    No results found for: "VITAMINB12"       Passed - Cr in normal range and within 360 days    Creatinine, Ser  Date Value Ref Range Status  12/16/2022 0.64 0.57 - 1.00 mg/dL Final         Passed - HBA1C is between 0 and 7.9 and within 180 days    Hemoglobin A1C  Date Value Ref Range Status  12/19/2022 6.6 (A) 4.0 - 5.6 % Final   Hgb A1c MFr Bld  Date Value Ref Range Status  05/23/2022 6.5 (H) 4.8 - 5.6 % Final    Comment:             Prediabetes: 5.7 - 6.4          Diabetes: >6.4          Glycemic control for adults with diabetes: <7.0          Passed - eGFR in normal range and within 360 days    GFR calc Af Amer  Date Value Ref Range Status  11/24/2020 128 >59 mL/min/1.73 Final    Comment:    **In accordance with recommendations from the NKF-ASN Task force,**   [facility] is in the process of updating its eGFR calculation to the   2021 CKD-EPI creatinine equation that estimates kidney function   without a race variable.    GFR, Estimated  Date Value Ref Range Status  07/10/2022 >60 >60 mL/min Final    Comment:    (NOTE) Calculated using the CKD-EPI Creatinine Equation (2021)    eGFR  Date Value Ref Range Status  12/16/2022 104 >59 mL/min/1.73 Final         Passed - Valid encounter within last 6 months    Recent Outpatient Visits           2 days ago Chronic pain of both [place], [place] W, NP   8 months ago Insomnia secondary to situational depression   [place], Vernia Buff, NP   9 months ago Chronic pain of both knees   [place], Vernia Buff, NP   1 year ago Essential hypertension   [place], Vernia Buff, NP   1 year ago Graves disease   [place], [place]              Passed - CBC within normal limits and completed in the last 12 months    WBC  Date Value Ref Range Status  12/16/2022 7.3 3.4 - 10.8 x10E3/uL Final  07/10/2022 8.0 4.0 - 10.5 K/uL Final   RBC  Date Value Ref Range Status  12/16/2022 4.10 3.77 - 5.28 x10E6/uL Final  07/10/2022 4.41 3.87 - 5.11 MIL/uL Final   Hemoglobin  Date Value Ref Range Status  12/16/2022 12.0 11.1 - 15.9 g/dL Final   Hematocrit  Date Value Ref Range Status  12/16/2022 38.1 34.0 - 46.6 % Final  MCHC  Date Value Ref Range Status  12/16/2022 31.5 31.5 - 35.7 g/dL Final  07/10/2022 31.5 30.0 - 36.0 g/dL Final   [facility]  Date Value Ref Range Status  12/16/2022 29.3 26.6 - 33.0 pg Final  07/10/2022 30.4 26.0 - 34.0 pg Final   MCV  Date Value Ref Range Status  12/16/2022 93 79 - 97 fL Final   No results found for: "PLTCOUNTKUC", "LABPLAT", "POCPLA" RDW  Date Value Ref Range Status  12/16/2022 12.3 11.7 - 15.4 % Final          glipiZIDE (GLUCOTROL) 10 MG tablet 180 tablet 1    Sig: Take 1 tablet (10 mg total) by mouth 2 (two) times daily before a meal.     Endocrinology:  Diabetes - Sulfonylureas Passed - 02/17/2023  5:04 PM      Passed - HBA1C is between 0 and 7.9 and within 180 days    Hemoglobin A1C  Date Value Ref Range Status  12/19/2022 6.6 (A) 4.0 - 5.6 % Final   Hgb A1c MFr Bld  Date Value Ref Range Status  05/23/2022 6.5 (H) 4.8 - 5.6 % Final    Comment:             Prediabetes: 5.7 - 6.4          Diabetes: >6.4          Glycemic control for adults with diabetes: <7.0          Passed - Cr in normal range and within 360 days    Creatinine, Ser  Date Value Ref Range Status  12/16/2022 0.64 0.57 - 1.00 mg/dL Final          Passed - Valid encounter within last 6 months    Recent Outpatient Visits           2 days ago Chronic pain of both [place], Vernia Buff, NP   8 months ago Insomnia secondary to situational depression   [place], Zelda W, NP   9 months ago Chronic pain of both knees   [place] [name], NP   1 year ago Essential hypertension   [place], NP   1 year ago Graves disease   [place], Levada Dy M, [place]               atorvastatin (LIPITOR) 20 MG tablet 90 tablet 1    Sig: Take 1 tablet (20 mg total) by mouth daily.     Cardiovascular:  Antilipid - Statins Failed - 02/17/2023  5:04 PM      Failed - Lipid Panel in normal range within the last 12 months    Cholesterol, Total  Date Value Ref Range Status  12/16/2022 101 100 - 199 mg/dL Final   LDL Chol Calc (NIH)  Date Value Ref Range Status  12/16/2022 47 0 - 99 mg/dL Final   HDL  Date Value Ref Range Status  12/16/2022 40 >39 mg/dL Final   Triglycerides  Date Value Ref Range Status  12/16/2022 65 0 - 149 mg/dL Final         Passed - Patient is not pregnant      Passed - Valid encounter within last 12 months    Recent Outpatient Visits  2 days ago Chronic pain of both knees   [place], [place] W, NP   8 months ago Insomnia secondary to situational depression   [place], NP   9 months ago Chronic pain of both knees   [place], Vernia Buff, NP   1 year ago Essential hypertension   [place], Vernia Buff, NP   1 year ago [facility] disease   [place],  [place], [place]

## 2023-02-17 NOTE — Telephone Encounter (Signed)
Requested medications are due for refill today.  yes  Requested medications are on the active medications list.  yes  Last refill. 06/21/2022 #360 1 rf  Future visit scheduled.   no  Notes to clinic.  Rx written to expire 02/16/2023 - Rx is expired.    Requested Prescriptions  Pending Prescriptions Disp Refills   metFORMIN (GLUCOPHAGE-XR) 500 MG 24 hr tablet 360 tablet 1    Sig: Take 4 tablets (2,000 mg total) by mouth at bedtime.     Endocrinology:  Diabetes - Biguanides Failed - 02/17/2023  5:04 PM      Failed - B12 Level in normal range and within 720 days    No results found for: "VITAMINB12"       Passed - Cr in normal range and within 360 days    Creatinine, Ser  Date Value Ref Range Status  12/16/2022 0.64 0.57 - 1.00 mg/dL Final         Passed - HBA1C is between 0 and 7.9 and within 180 days    Hemoglobin A1C  Date Value Ref Range Status  12/19/2022 6.6 (A) 4.0 - 5.6 % Final   Hgb A1c MFr Bld  Date Value Ref Range Status  05/23/2022 6.5 (H) 4.8 - 5.6 % Final    Comment:             Prediabetes: 5.7 - 6.4          Diabetes: >6.4          Glycemic control for adults with diabetes: <7.0          Passed - eGFR in normal range and within 360 days    GFR calc Af Amer  Date Value Ref Range Status  11/24/2020 128 >59 mL/min/1.73 Final    Comment:    **In accordance with recommendations from the NKF-ASN Task force,**   [facility] is in the process of updating its eGFR calculation to the   2021 CKD-EPI creatinine equation that estimates kidney function   without a race variable.    GFR, Estimated  Date Value Ref Range Status  07/10/2022 >60 >60 mL/min Final    Comment:    (NOTE) Calculated using the CKD-EPI Creatinine Equation (2021)    eGFR  Date Value Ref Range Status  12/16/2022 104 >59 mL/min/1.73 Final         Passed - Valid encounter within last 6 months    Recent Outpatient Visits           2 days ago Chronic pain of both [place], [place] W, NP   8 months ago Insomnia secondary to situational depression   [place], Vernia Buff, NP   9 months ago Chronic pain of both knees   [place], Vernia Buff, NP   1 year ago Essential hypertension   [place], NP   1 year ago Graves disease   [place], [place]              Passed - CBC within normal limits and completed in the last 12 months    WBC  Date Value Ref Range Status  12/16/2022 7.3 3.4 - 10.8 x10E3/uL Final  07/10/2022 8.0 4.0 - 10.5 K/uL Final   RBC  Date Value Ref Range Status  12/16/2022 4.10 3.77 - 5.28 x10E6/uL Final  07/10/2022 4.41 3.87 - 5.11 MIL/uL Final   Hemoglobin  Date Value Ref Range Status  12/16/2022 12.0 11.1 - 15.9 g/dL Final   Hematocrit  Date Value Ref Range Status  12/16/2022 38.1 34.0 - 46.6 % Final   MCHC  Date Value Ref Range Status  12/16/2022 31.5 31.5 - 35.7 g/dL Final  07/10/2022 31.5 30.0 - 36.0 g/dL Final   [facility]  Date Value Ref Range Status  12/16/2022 29.3 26.6 - 33.0 pg Final  07/10/2022 30.4 26.0 - 34.0 pg Final   MCV  Date Value Ref Range Status  12/16/2022 93 79 - 97 fL Final   No results found for: "PLTCOUNTKUC", "LABPLAT", "POCPLA" RDW  Date Value Ref Range Status  12/16/2022 12.3 11.7 - 15.4 % Final         Signed Prescriptions Disp Refills   glipiZIDE (GLUCOTROL) 10 MG tablet 180 tablet 1    Sig: Take 1 tablet (10 mg total) by mouth 2 (two) times daily before a meal.     Endocrinology:  Diabetes - Sulfonylureas Passed - 02/17/2023  5:04 PM      Passed - HBA1C is between 0 and 7.9 and within 180 days    Hemoglobin A1C  Date Value Ref Range Status  12/19/2022 6.6 (A) 4.0 - 5.6 % Final   Hgb A1c MFr Bld  Date Value Ref Range Status  05/23/2022 6.5 (H) 4.8 - 5.6 %  Final    Comment:             Prediabetes: 5.7 - 6.4          Diabetes: >6.4          Glycemic control for adults with diabetes: <7.0          Passed - Cr in normal range and within 360 days    Creatinine, Ser  Date Value Ref Range Status  12/16/2022 0.64 0.57 - 1.00 mg/dL Final         Passed - Valid encounter within last 6 months    Recent Outpatient Visits           2 days ago Chronic pain of both [place], Vernia Buff, NP   8 months ago Insomnia secondary to situational depression   [place], Zelda W, NP   9 months ago Chronic pain of both knees   [place] [name], NP   1 year ago Essential hypertension   [place], NP   1 year ago Graves disease   [place], Levada Dy M, [place]               atorvastatin (LIPITOR) 20 MG tablet 90 tablet 1    Sig: Take 1 tablet (20 mg total) by mouth daily.     Cardiovascular:  Antilipid - Statins Failed - 02/17/2023  5:04 PM      Failed - Lipid Panel in normal range within the last 12 months    Cholesterol, Total  Date Value Ref Range Status  12/16/2022 101 100 - 199 mg/dL Final   LDL Chol Calc (NIH)  Date Value Ref Range Status  12/16/2022 47 0 - 99 mg/dL Final   HDL  Date Value Ref Range Status  12/16/2022 40 >39 mg/dL Final  Triglycerides  Date Value Ref Range Status  12/16/2022 65 0 - 149 mg/dL Final         Passed - Patient is not pregnant      Passed - Valid encounter within last 12 months    Recent Outpatient Visits           2 days ago Chronic pain of both [place], [place] W, NP   8 months ago Insomnia secondary to situational depression   [place], NP   9 months  ago Chronic pain of both knees   [place], Vernia Buff, NP   1 year ago Essential hypertension   [place], Vernia Buff, NP   1 year ago [facility] disease   [place], [place], [place]

## 2023-02-18 ENCOUNTER — Other Ambulatory Visit: Payer: Self-pay

## 2023-02-20 ENCOUNTER — Other Ambulatory Visit: Payer: Self-pay

## 2023-02-20 ENCOUNTER — Ambulatory Visit
Admission: RE | Admit: 2023-02-20 | Discharge: 2023-02-20 | Disposition: A | Discharge status: Home / Self Care | Payer: Medicaid Other | Source: Ambulatory Visit | Attending: Nurse Practitioner | Admitting: Nurse Practitioner

## 2023-02-20 ENCOUNTER — Other Ambulatory Visit: Payer: Self-pay | Admitting: Nurse Practitioner

## 2023-02-20 ENCOUNTER — Encounter: Payer: Self-pay | Admitting: Nurse Practitioner

## 2023-02-20 DIAGNOSIS — E1169 Type 2 diabetes mellitus with other specified complication: Secondary | ICD-10-CM

## 2023-02-20 DIAGNOSIS — G8929 Other chronic pain: Secondary | ICD-10-CM

## 2023-02-20 MED ORDER — METHIMAZOLE 5 MG PO TABS
5.0000 mg | ORAL_TABLET | Freq: Every day | ORAL | 0 refills | Status: DC
  Filled 2023-02-20: qty 30, 30d supply, fill #0

## 2023-02-20 MED ORDER — APIXABAN 5 MG PO TABS
5.0000 mg | ORAL_TABLET | Freq: Two times a day (BID) | ORAL | 0 refills | Status: DC
  Filled 2023-02-20: qty 60, 30d supply, fill #0

## 2023-02-20 NOTE — Progress Notes (Signed)
Pt dropped off "mailer" requesting her PCP's signature. Handed to [place] (CMA) to give to [facility]; mentioned she would call pt when document is signed and ready for pt pickup.

## 2023-02-20 NOTE — Telephone Encounter (Signed)
Requested medication (s) are due for refill today:   Not sure  Provider to review  Requested medication (s) are on the active medication list:   Yes  Future visit scheduled:   No   Last ordered: Video visit with Geryl Rankins.   Rx from [employer], PA-C with [facility].  Returned for provider review for refills.      Requested Prescriptions  Pending Prescriptions Disp Refills   apixaban (ELIQUIS) 5 MG TABS tablet 60 tablet 1    Sig: Take 1 tablet (5 mg total) by mouth 2 (two) times daily.     There is no refill protocol information for this order     methimazole (TAPAZOLE) 5 MG tablet 30 tablet 1    Sig: Take 1 tablet (5 mg total) by mouth daily.     There is no refill protocol information for this order

## 2023-02-21 ENCOUNTER — Ambulatory Visit: Payer: Medicaid Other | Admitting: Physician Assistant

## 2023-02-21 ENCOUNTER — Telehealth: Payer: Self-pay

## 2023-02-21 DIAGNOSIS — M25561 Pain in right knee: Secondary | ICD-10-CM | POA: Diagnosis not present

## 2023-02-21 DIAGNOSIS — M170 Bilateral primary osteoarthritis of knee: Secondary | ICD-10-CM | POA: Diagnosis not present

## 2023-02-21 DIAGNOSIS — M25562 Pain in left knee: Secondary | ICD-10-CM

## 2023-02-21 DIAGNOSIS — G8929 Other chronic pain: Secondary | ICD-10-CM

## 2023-02-21 MED ORDER — METHYLPREDNISOLONE ACETATE 40 MG/ML IJ SUSP
80.0000 mg | INTRAMUSCULAR | Status: AC | PRN
  Administered 2023-02-21: 80 mg via INTRA_ARTICULAR

## 2023-02-21 MED ORDER — BUPIVACAINE HCL 0.25 % IJ SOLN
2.0000 mL | INTRAMUSCULAR | Status: AC | PRN
  Administered 2023-02-21: 2 mL via INTRA_ARTICULAR

## 2023-02-21 MED ORDER — LIDOCAINE HCL 1 % IJ SOLN
2.0000 mL | INTRAMUSCULAR | Status: AC | PRN
  Administered 2023-02-21: 2 mL

## 2023-02-21 NOTE — Progress Notes (Signed)
Patient attempted to be outreached by Darrall Dears, PharmD Candidate on 02/20/2023 to discuss hypertension. Patient's voicemail box is full at this time.   Joseph Art, Pharm.D. PGY-2 Ambulatory Care Pharmacy Resident

## 2023-02-21 NOTE — Progress Notes (Signed)
Office Visit Note   Patient: Haley [name]           Date of Birth: [birth year]           MRN: [id number] Visit Date: 02/21/2023              Requested by: [name], NP [address] [place],  [place] [address] PCP: [name], NP   Assessment & Plan: Visit Diagnoses:  1. Chronic pain of both knees     Plan: Patient is a pleasant 57 year old woman with a BMI of 42.3 who comes in today with a history of bilateral knee pain tightens quite a long while.  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  She has difficulty after she has been sitting for a while.  Denies any recent illness.  She did have x-rays yesterday.  X-rays demonstrate moderate to severe tricompartmental arthritis worse in the right than the left.  Unfortunately she cannot take anti-inflammatories because she is on chronic blood thinners.  This for atrial fibrillation.  She also is a diabetic but her hemoglobin A1c is in the low sixes.  Discussed with her and reviewed the x-rays with her and her options.  Certainly we can try a steroid injection into 1 knee today.  She can return in 2 weeks for injection in the other knee.  We could also consider viscosupplementation.  She already does use a topical anti-inflammatory which is helpful.  Will also refer her to physical therapy to learn some knee strengthening exercises.  She is already lost some weight and says that this has been helpful.  Ultimately she may require knee replacements.  She would need to lose about 20 pounds to be under 40 BMI.  Most of her weight is truncal and not in her lower extremities  Follow-Up Instructions: 2 weeks  Orders:  Orders Placed This Encounter  Procedures   Ambulatory referral to Physical Therapy   No orders of the defined types were placed in this encounter.     Procedures: Large Joint Inj on 02/21/2023 11:21 AM Indications: pain and diagnostic evaluation Details: 25 G  1.5 in needle, anterolateral approach  Arthrogram: No  Medications: 80 mg methylPREDNISolone acetate 40 MG/ML; 2 mL lidocaine 1 %; 2 mL bupivacaine 0.25 % Outcome: tolerated well, no immediate complications Procedure, treatment alternatives, risks and benefits explained, specific risks discussed. Consent was given by the patient.       Clinical Data: No additional findings.   Subjective: Chief Complaint  Patient presents with   Right Knee - Pain   Left Knee - Pain    HPI pleasant 57 year old woman who comes in today for evaluation of right greater than left knee pain.  No particular injury.  She cannot take anti-inflammatories because she is on chronic blood thinners.  She has tried some Aspercreme which helped.  She is also been losing a little weight which she also finds helpful  Pain Assessment  Average Pain: 5 Current Pain: 5 Aggravating Factors: Weightbearing Alleviating Factors: Topical anti-inflammatories Review of Systems  All other systems reviewed and are negative.    Objective: Vital Signs: There were no vitals taken for this visit.  Physical Exam Constitutional:      Appearance: Normal appearance.  Pulmonary:     Effort: Pulmonary effort is normal.  Skin:    General: Skin is warm and dry.  Neurological:  Mental Status: She is alert.     Ortho Exam Bilateral knees no effusion no erythema no redness.  She is neurovascular intact compartments are soft and nontender she has global tenderness throughout the knee.  No evidence of infective process.  She has very limited patellar mobility Specialty Comments:  No specialty comments available.  Imaging: No results found.   PMFS History: Patient Active Problem List   Diagnosis Date Noted   Bilateral knee pain 02/21/2023   Chronic diastolic CHF (congestive heart failure) ([facility] [facility]) 02/17/2022   Abnormal vaginal bleeding in postmenopausal patient 11/25/2020   Goiter with hyperthyroidism    UARS (upper  airway resistance syndrome)    Hyperthyroidism    Acute diastolic (congestive) heart failure ([place]) 12/21/2019   Hypoxia 12/21/2019   Substernal thyroid goiter 02/19/2019   Current use of long term anticoagulation 02/19/2019   Colon cancer screening 02/19/2019   Pure hypercholesterolemia 02/19/2019   Breast cancer screening 02/19/2019   Class 3 severe obesity due to excess calories with serious comorbidity and body mass index (BMI) of 45.0 to 49.9 in adult ([facility]) 01/27/2019   Hypomagnesemia 01/27/2019   Type II diabetes mellitus ([facility] [facility]) 01/01/2019   Severe sleep apnea 01/14/2017   Hypoxemia 01/14/2017   Paroxysmal atrial fibrillation ([facility])    Essential hypertension    Past Medical History:  Diagnosis Date   Essential hypertension    Fibroids    Hyperthyroidism    a. 2010, treated w/ tapazole.   Morbid obesity ([facility])    Paroxysmal atrial fibrillation ([facility])    a. 03/2009 - in setting of hyperthyroidism and caffeine intake, converted spontaneously;  b. 08/2016 ED visit for recurrent PAF-->successful DCCV in ED.   Snores    Type II diabetes mellitus ([facility])     Family History  Problem Relation Age of Onset   Hypertension Mother    Diabetes Mellitus II Sister    Cancer Maternal Grandmother    Breast cancer Neg Hx     Past Surgical History:  Procedure Laterality Date   UTERINE FIBROID SURGERY     Social History   Occupational History   Occupation: cosmetologist  Tobacco Use   Smoking status: Former    Packs/day: 1.00    Years: 32.00    Additional pack years: 0.00    Total pack years: 32.00    Types: Cigarettes    Quit date: 2012    Years since quitting: 12.2   Smokeless tobacco: Never   Tobacco comments:    patient vapes  Vaping Use   Vaping Use: Never used  Substance and Sexual Activity   Alcohol use: No    Comment: occasional drink.   Drug use: No   Sexual activity: Yes

## 2023-02-23 NOTE — Progress Notes (Signed)
Patient attempted to be outreached by Estelle June, PharmD Candidate on 02/23/23 to discuss hypertension. Patient's voice mailbox was full.   Estelle June, PharmD Candidate 2026  [place] [facility]   Maryan Puls, PharmD PGY-1 [facility] Resident

## 2023-02-28 NOTE — Progress Notes (Signed)
Patient attempted to be outreached by Georga Kaufmann, PharmD Candidate on 02/28/23 to discuss hypertension. Left voicemail for patient to return our call at their convenience at [phone].   Georga Kaufmann, PharmD Candidate   Maryan Puls, PharmD PGY-1 [facility] Resident

## 2023-03-01 ENCOUNTER — Other Ambulatory Visit: Payer: Self-pay

## 2023-03-01 ENCOUNTER — Telehealth: Payer: Self-pay

## 2023-03-01 NOTE — Telephone Encounter (Signed)
Pt calling back, states she has seen Mychart message for XR results. Has PT appt tomorrow and also has appt with Ortho upcoming on 03/09/23. Pt is asking if Zelda, NP has filled out [employer] card that she dropped off on 02/20/23, advised her I didn't see any updates in chart on it being ready for PU but would send message back. Pt also asking for something for pain for knees. I recommended since she is being seen by Ortho for knee pain now to FU with them first and if they are unable to prescribe something to call back so we can let Zelda know the request and she what her recommendations are. Pt verbalized understanding and needed no further assistance.

## 2023-03-01 NOTE — Telephone Encounter (Signed)
Yes, we have the card and I will call her once its filled out.

## 2023-03-02 ENCOUNTER — Ambulatory Visit: Payer: Medicaid Other

## 2023-03-02 NOTE — Telephone Encounter (Signed)
Spoke with patient , advised that we do have the [facility] card and once it completed she will be called.

## 2023-03-03 ENCOUNTER — Telehealth: Payer: Self-pay

## 2023-03-03 NOTE — Telephone Encounter (Signed)
VMT pt requesting call back reference PREP referral  

## 2023-03-09 ENCOUNTER — Telehealth: Payer: Self-pay

## 2023-03-09 ENCOUNTER — Telehealth: Payer: Self-pay | Admitting: Nurse Practitioner

## 2023-03-09 ENCOUNTER — Ambulatory Visit: Payer: Medicaid Other | Admitting: Physician Assistant

## 2023-03-09 NOTE — Telephone Encounter (Signed)
Returned her call, explained PREP, she really expressed interest in aquatic program, explained that with PREP program, we don't get into the water to exercise, but the program [facility] so she would have access to a pool at [place] or [place]; she will need an evening class and is close to [place] so will ask Pam RN [facility] to contact her about next evening class at [place].

## 2023-03-14 ENCOUNTER — Encounter: Payer: Self-pay | Admitting: Physician Assistant

## 2023-03-14 ENCOUNTER — Ambulatory Visit (INDEPENDENT_AMBULATORY_CARE_PROVIDER_SITE_OTHER): Payer: Medicaid Other | Admitting: Physician Assistant

## 2023-03-14 DIAGNOSIS — M1712 Unilateral primary osteoarthritis, left knee: Secondary | ICD-10-CM

## 2023-03-14 DIAGNOSIS — M170 Bilateral primary osteoarthritis of knee: Secondary | ICD-10-CM

## 2023-03-14 MED ORDER — LIDOCAINE HCL 1 % IJ SOLN
2.0000 mL | INTRAMUSCULAR | Status: AC | PRN
  Administered 2023-03-14: 2 mL

## 2023-03-14 MED ORDER — BUPIVACAINE HCL 0.25 % IJ SOLN
2.0000 mL | INTRAMUSCULAR | Status: AC | PRN
  Administered 2023-03-14: 2 mL via INTRA_ARTICULAR

## 2023-03-14 MED ORDER — METHYLPREDNISOLONE ACETATE 40 MG/ML IJ SUSP
80.0000 mg | INTRAMUSCULAR | Status: AC | PRN
  Administered 2023-03-14: 80 mg via INTRA_ARTICULAR

## 2023-03-14 NOTE — Progress Notes (Addendum)
Office Visit Note   Patient: Haley [name]           Date of Birth: 1966/11/30           MRN: [id number] Visit Date: 03/14/2023              Requested by: Claiborne Rigg, NP [address] [place],  [place] [address] PCP: Claiborne Rigg, NP  Chief Complaint  Patient presents with   Right Knee - Follow-up   Left Knee - Follow-up      HPI: Patient is a pleasant 57 year old woman who has a history of bilateral osteoarthritis of her knees.  She underwent a right steroid injection 2 weeks ago.  She thinks it gave her good relief.  She is now here to have her left knee injected as she is borderline diabetic  Assessment & Plan: Visit Diagnoses: Osteoarthritis bilateral knees  Plan: Micah Flesher forward with an injection without difficulty.  Discussed again with her gel injections if these are not long-lasting she will contact me if she would like to go through with this This patient is diagnosed with osteoarthritis of the knee(s).    Radiographs show evidence of joint space narrowing, osteophytes, subchondral sclerosis and/or subchondral cysts.  This patient has knee pain which interferes with functional and activities of daily living.    This patient has experienced inadequate response, adverse effects and/or intolerance with conservative treatments such as acetaminophen, NSAIDS, topical creams, physical therapy or regular exercise, knee bracing and/or weight loss.   This patient has experienced inadequate response or has a contraindication to intra articular steroid injections for at least 3 months.   This patient is not scheduled to have a total knee replacement within 6 months of starting treatment with viscosupplementation.  Follow-Up Instructions: No follow-ups on file.   Ortho Exam  Patient is alert, oriented, no adenopathy, well-dressed, normal affect, normal respiratory effort. Examination of her left knee: No effusion no erythema no signs of infection  compartments are soft nontender she is neurovascular intact  Imaging: No results found. No images are attached to the encounter.  Labs: Lab Results  Component Value Date   HGBA1C 6.6 (A) 12/19/2022   HGBA1C 6.5 (H) 05/23/2022   HGBA1C 6.8 (A) 02/15/2022     Lab Results  Component Value Date   ALBUMIN 4.4 12/16/2022   ALBUMIN 4.3 07/10/2022   ALBUMIN 4.8 05/23/2022    Lab Results  Component Value Date   MG 1.9 12/24/2019   MG 1.6 (L) 12/23/2019   MG 1.5 (L) 12/22/2019   No results found for: "VD25OH"  No results found for: "PREALBUMIN"    Latest Ref Rng & Units 12/16/2022   12:05 PM 07/10/2022    9:55 PM 10/15/2021   10:17 AM  CBC EXTENDED  WBC 3.4 - 10.8 x10E3/uL 7.3  8.0  7.6   RBC 3.77 - 5.28 x10E6/uL 4.10  4.41  4.45   Hemoglobin 11.1 - 15.9 g/dL 19.1  47.8  29.5   HCT 34.0 - 46.6 % 38.1  42.5  41.7   Platelets 150 - 450 x10E3/uL 415  253  244   NEUT# 1.7 - 7.7 K/uL  3.6    Lymph# 0.7 - 4.0 K/uL  3.4       There is no height or weight on file to calculate BMI.  Orders:  No orders of the defined types were placed in this encounter.  No orders of the defined types were placed in  this encounter.    Procedures: Large Joint Inj: L knee on 03/14/2023 10:30 AM Indications: pain and diagnostic evaluation Details: 25 G 1.5 in needle, anterolateral approach  Arthrogram: No  Medications: 80 mg methylPREDNISolone acetate 40 MG/ML; 2 mL lidocaine 1 %; 2 mL bupivacaine 0.25 % Outcome: tolerated well, no immediate complications Procedure, treatment alternatives, risks and benefits explained, specific risks discussed. Consent was given by the patient.    Clinical Data: No additional findings.  ROS:  All other systems negative, except as noted in the HPI. Review of Systems  Objective: Vital Signs: There were no vitals taken for this visit.  Specialty Comments:  No specialty comments available.  PMFS History: Patient Active Problem List   Diagnosis Date  Noted   Bilateral knee pain 02/21/2023   Osteoarthritis of knees, bilateral 02/21/2023   Chronic diastolic CHF (congestive heart failure) 02/17/2022   Abnormal vaginal bleeding in postmenopausal patient 11/25/2020   Goiter with hyperthyroidism    UARS (upper airway resistance syndrome)    Hyperthyroidism    Acute diastolic (congestive) heart failure 12/21/2019   Hypoxia 12/21/2019   Substernal thyroid goiter 02/19/2019   Current use of long term anticoagulation 02/19/2019   Colon cancer screening 02/19/2019   Pure hypercholesterolemia 02/19/2019   Breast cancer screening 02/19/2019   Class 3 severe obesity due to excess calories with serious comorbidity and body mass index (BMI) of 45.0 to 49.9 in adult 01/27/2019   Hypomagnesemia 01/27/2019   Type II diabetes mellitus 01/01/2019   Severe sleep apnea 01/14/2017   Hypoxemia 01/14/2017   Paroxysmal atrial fibrillation    Essential hypertension    Past Medical History:  Diagnosis Date   Essential hypertension    Fibroids    Hyperthyroidism    a. 2010, treated w/ tapazole.   Morbid obesity ([facility])    Paroxysmal atrial fibrillation ([facility])    a. 03/2009 - in setting of hyperthyroidism and caffeine intake, converted spontaneously;  b. 08/2016 ED visit for recurrent PAF-->successful DCCV in ED.   Snores    Type II diabetes mellitus ([facility])     Family History  Problem Relation Age of Onset   Hypertension Mother    Diabetes Mellitus II Sister    Cancer Maternal Grandmother    Breast cancer Neg Hx     Past Surgical History:  Procedure Laterality Date   UTERINE FIBROID SURGERY     Social History   Occupational History   Occupation: cosmetologist  Tobacco Use   Smoking status: Former    Packs/day: 1.00    Years: 32.00    Additional pack years: 0.00    Total pack years: 32.00    Types: Cigarettes    Quit date: 2012    Years since quitting: 12.2   Smokeless tobacco: Never   Tobacco comments:    patient vapes  Vaping Use    Vaping Use: Never used  Substance and Sexual Activity   Alcohol use: No    Comment: occasional drink.   Drug use: No   Sexual activity: Yes

## 2023-03-15 ENCOUNTER — Other Ambulatory Visit: Payer: Self-pay

## 2023-03-15 ENCOUNTER — Encounter: Payer: Self-pay | Admitting: Rehabilitative and Restorative Service Providers"

## 2023-03-15 ENCOUNTER — Ambulatory Visit: Payer: Medicaid Other | Attending: Physician Assistant | Admitting: Rehabilitative and Restorative Service Providers"

## 2023-03-15 DIAGNOSIS — R2689 Other abnormalities of gait and mobility: Secondary | ICD-10-CM

## 2023-03-15 DIAGNOSIS — M25562 Pain in left knee: Secondary | ICD-10-CM | POA: Diagnosis not present

## 2023-03-15 DIAGNOSIS — M25561 Pain in right knee: Secondary | ICD-10-CM | POA: Insufficient documentation

## 2023-03-15 DIAGNOSIS — M6281 Muscle weakness (generalized): Secondary | ICD-10-CM | POA: Diagnosis present

## 2023-03-15 DIAGNOSIS — R262 Difficulty in walking, not elsewhere classified: Secondary | ICD-10-CM

## 2023-03-15 DIAGNOSIS — G8929 Other chronic pain: Secondary | ICD-10-CM | POA: Insufficient documentation

## 2023-03-15 NOTE — Therapy (Signed)
OUTPATIENT PHYSICAL THERAPY LOWER EXTREMITY EVALUATION   Patient Name: Haley [name] MRN: [id number] DOB:[birth year], 57 y.o., female Today's Date: 03/15/2023  END OF SESSION:  PT End of Session - 03/15/23 1153     Visit Number 1    Date for PT Re-Evaluation 05/12/23    Authorization Type Medicaid [employer]    Authorization - Number of Visits 27    PT Start Time 1145    PT Stop Time 1225    PT Time Calculation (min) 40 min    Activity Tolerance Patient tolerated treatment well    Behavior During Therapy WFL for tasks assessed/performed             Past Medical History:  Diagnosis Date   Essential hypertension    Fibroids    Hyperthyroidism    a. 2010, treated w/ tapazole.   Morbid obesity    Paroxysmal atrial fibrillation    a. 03/2009 - in setting of hyperthyroidism and caffeine intake, converted spontaneously;  b. 08/2016 ED visit for recurrent PAF-->successful DCCV in ED.   Snores    Type II diabetes mellitus    Past Surgical History:  Procedure Laterality Date   UTERINE FIBROID SURGERY     Patient Active Problem List   Diagnosis Date Noted   Bilateral knee pain 02/21/2023   Osteoarthritis of knees, bilateral 02/21/2023   Chronic diastolic CHF (congestive heart failure) 02/17/2022   Abnormal vaginal bleeding in postmenopausal patient 11/25/2020   Goiter with hyperthyroidism    UARS (upper airway resistance syndrome)    Hyperthyroidism    Acute diastolic (congestive) heart failure 12/21/2019   Hypoxia 12/21/2019   Substernal thyroid goiter 02/19/2019   Current use of long term anticoagulation 02/19/2019   Colon cancer screening 02/19/2019   Pure hypercholesterolemia 02/19/2019   Breast cancer screening 02/19/2019   Class 3 severe obesity due to excess calories with serious comorbidity and body mass index (BMI) of 45.0 to 49.9 in adult 01/27/2019   Hypomagnesemia 01/27/2019   Type II diabetes mellitus 01/01/2019   Severe sleep apnea 01/14/2017    Hypoxemia 01/14/2017   Paroxysmal atrial fibrillation    Essential hypertension     PCP: Claiborne RiggFleming, Zelda W, NP  REFERRING PROVIDER: Persons, [place], PA  REFERRING DIAG: M25.561,M25.562,G89.29 (ICD-10-CM) - Chronic pain of both knees  THERAPY DIAG:  Difficulty in walking, not elsewhere classified - Plan: PT plan of care cert/re-cert  Other abnormalities of gait and mobility - Plan: PT plan of care cert/re-cert  Muscle weakness (generalized) - Plan: PT plan of care cert/re-cert  Rationale for Evaluation and Treatment: Rehabilitation  ONSET DATE: approx 2 years ago  SUBJECTIVE:   SUBJECTIVE STATEMENT: Patient reports that she had an injection at ortho office and that has helped some with her knee pain.  Patient reports that she is hoping to delay surgery as long as possible.  States she had a membership to [employer] in the past and did enjoy aquatic PT, but does not have a membership currently.  PERTINENT HISTORY: HTN, DM, OA, obesity  PAIN:  Are you having pain? Yes: NPRS scale: 3-8/10 Pain location: both knees Pain description: aching, sharp, stiff Aggravating factors: sitting for too long, walking for too long Relieving factors: change in position, medication  PRECAUTIONS: None  WEIGHT BEARING RESTRICTIONS: No  FALLS:  Has patient fallen in last 6 months? 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  NEXT MD VISIT: Endocrinologist on 04/06/2023  OBJECTIVE:   DIAGNOSTIC FINDINGS:  Left knee radiograph on 02/20/2023: IMPRESSION: Severe medial and patellofemoral  compartment osteoarthritis.  Right knee radiograph on 02/20/2023: IMPRESSION: 1. Severe medial and patellofemoral compartment osteoarthritis, worsened from remote prior. 2. Chronic 12 mm loose body within the suprapatellar joint space.  PATIENT SURVEYS:  03/15/2023: LEFS 24 / 80 = 30.0 %  COGNITION: Overall cognitive status: Within functional limits for tasks assessed     SENSATION: Eval:  reports numbness and tingling in right thigh   MUSCLE LENGTH: Hamstrings: tightness bilaterally with right more impaired  POSTURE: rounded shoulders and forward head  PALPATION: Tender to palpation along right knee  LOWER EXTREMITY ROM:  03/15/2023: Right knee 3-70 degrees Left knee 0-93 degrees  LOWER EXTREMITY MMT:  03/15/2023: Right hip flexor 3+/5, Right quad 4-/5, Right hamstring 4-/5 Left hip flexor is 3+/5, left quad is 4+/5, left hamstring 4-/5   FUNCTIONAL TESTS:   03/15/2023: 5 times sit to stand: 17.96 sec with UE use Timed up and go (TUG): 13.07 sec  GAIT: Distance walked: >50 ft Assistive device utilized: None Level of assistance: Modified independence Comments: Pt reports that she can walk at least 10 minutes before she starts having knee pain   TODAY'S TREATMENT:                                                                                                                              DATE: 03/15/2023  Reviewed HEP Check all possible CPT codes: 62130 - PT Re-evaluation, 97110- Therapeutic Exercise, [phone]- Neuro Re-education, [phone] - Gait Training, 97140 - Manual Therapy, 97530 - Therapeutic Activities, 97014 - Electrical stimulation (unattended), [id number] - Ultrasound, and [id number] - Aquatic therapy    Check all conditions that are expected to impact treatment: Musculoskeletal disorders   If treatment provided at initial evaluation, no treatment charged due to lack of authorization.        PATIENT EDUCATION:  Education details: Issued HEP Person educated:  Patient Education method: Chief Technology Officer Education comprehension: verbalized understanding and returned demonstration  HOME EXERCISE PROGRAM: Access Code: [email or web address] URL: [email or web address] Date: 03/15/2023 Prepared by: Clydie Braun Ayaansh Smail  Exercises - Seated Hamstring Stretch  - 1 x daily - 7 x weekly - 1 sets - 2 reps - 20 sec hold - Seated March  - 1 x daily - 7 x weekly - 2 sets - 10 reps - Seated Long Arc Quad  - 1 x daily - 7 x weekly - 2 sets - 10 reps - Seated Heel Slide  - 1 x daily - 7 x weekly - 2 sets - 10 reps - Seated Heel Toe Raises  - 1  x daily - 7 x weekly - 2 sets - 10 reps - Standing March with Counter Support  - 1 x daily - 7 x weekly - 2 sets - 10 reps - Standing Hip Abduction with Counter Support  - 1 x daily - 7 x weekly - 2 sets - 10 reps - Standing Hip Extension with Counter Support  - 1 x daily - 7 x weekly - 2 sets - 10 reps  ASSESSMENT:  CLINICAL IMPRESSION: Patient is a 57 y.o. female who was seen today for physical therapy evaluation and treatment for bilateral knee pain/OA. Patient's PLOF is able to ambulate work out at [employer] and participated in [employer].  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  Patient presents to skilled PT with decreased A/ROM, muscle weakness, difficulty walking, and difficulty navigating the stairs.  Patient would benefit from skilled PT to address her functional impairments to allow her to improve her functional mobility and decreased pain.  OBJECTIVE IMPAIRMENTS: decreased balance, difficulty walking, decreased ROM, decreased strength, impaired flexibility, improper body mechanics, postural dysfunction, and pain.   ACTIVITY LIMITATIONS: bending, squatting, and stairs  PARTICIPATION LIMITATIONS: community activity and occupation  PERSONAL FACTORS: Past/current experiences, Time since onset of  injury/illness/exacerbation, and 3+ comorbidities: HTN, Diabetes, OA  are also affecting patient's functional outcome.   REHAB POTENTIAL: Good  CLINICAL DECISION MAKING: Evolving/moderate complexity  EVALUATION COMPLEXITY: Moderate   GOALS: Goals reviewed with patient? Yes  SHORT TERM GOALS: Target date: 04/07/2023 Patient will be independent with initial HEP. Baseline: Goal status: INITIAL  2.  Patient will be able to participate in a 6 minute walk test to establish a baseline of activity tolerance. Baseline:  Goal status: INITIAL   LONG TERM GOALS: Target date: 05/12/2023  Patient will be independent with advanced HEP. Baseline:  Goal status: INITIAL  2.  Patient will increase lower extremity functional scale to at least 50% to demonstrate improvements in functional mobility Baseline: 30% Goal status: INITIAL  3.  Patient will increase right knee A/ROM flexion to at least 95 degrees to allow patient to more easily navigate stairs. Baseline: 3-70 Goal status: INITIAL  4.  Patient will increase bilateral lower extremity strength to at least 4 to 4+/5 to allow her to increase her functional mobility. Baseline:  Goal status: INITIAL  5.  Patient will improve 5 times sit to/from stand to less than 15 seconds to improve her functional mobility. Baseline: 17.96 sec Goal status: INITIAL     PLAN:  PT FREQUENCY: 2x/week  PT DURATION: 8 weeks  PLANNED INTERVENTIONS: Therapeutic exercises, Therapeutic activity, Neuromuscular re-education, Balance training, Gait training, Patient/Family education, Self Care, Joint mobilization, Joint manipulation, Stair training, Aquatic Therapy, Dry Needling, Electrical stimulation, Spinal manipulation, Spinal mobilization, Cryotherapy, Moist heat, Taping, Ultrasound, Ionotophoresis 4mg /ml Dexamethasone, Manual therapy, and Re-evaluation  PLAN FOR NEXT SESSION: assess and progress HEP as indicated, strengthening, aquatic therapy as  indicated   Marcin Holte, PT 03/15/2023, 1:04 PM   [facility] [address] [place], [place] [address] Phone # [phone] Fax [phone]

## 2023-03-15 NOTE — Patient Instructions (Signed)
     [facility] Welcome to [facility]! Here you will find all the information you will need regarding your pool therapy. If you have further questions at any time, please call our office at [phone]. After completing your initial evaluation in the [facility], you may be eligible to complete a portion of your therapy in the pool. A typical week of therapy will consist of 1-2 typical physical therapy visits at our [facility] location and an additional session of therapy in the pool located at the [facility] at [address]. [address], [place] [address]. The phone number at the pool site is [phone]. Please call this number if you are running late or need to cancel your appointment.  Aquatic therapy will be offered on Wednesday mornings and Friday afternoons. Each session will last approximately 45 minutes. All scheduling and payments for aquatic therapy sessions, including cancelations, will be done through our [facility] location.  To be eligible for aquatic therapy, these criteria must be met: You must be able to independently change in the locker room and get to the pool deck. A caregiver can come with you to help if needed. There are benches for a caregiver to sit on next to the pool. No one with an open wound is permitted in the pool.  Handicap parking is available in the front and there is a drop off option for even closer accessibility. Please arrive 15 minutes prior to your appointment to prepare for your pool session. You must sign in at the front desk upon your arrival. Please be sure to attend to any toileting needs prior to entering the pool. Locker rooms for changing are available.  There is direct access to the pool deck from the locker room. You can lock your belongings in a locker or bring them with you poolside. Your therapist will greet you on the pool deck. There may be other swimmers in the pool at the  same time but your session is one-on-one with the therapist.   

## 2023-03-22 ENCOUNTER — Other Ambulatory Visit: Payer: Self-pay | Admitting: Cardiovascular Disease

## 2023-03-22 ENCOUNTER — Other Ambulatory Visit: Payer: Self-pay

## 2023-03-22 ENCOUNTER — Other Ambulatory Visit: Payer: Self-pay | Admitting: Family Medicine

## 2023-03-22 ENCOUNTER — Other Ambulatory Visit: Payer: Self-pay | Admitting: Nurse Practitioner

## 2023-03-22 DIAGNOSIS — I480 Paroxysmal atrial fibrillation: Secondary | ICD-10-CM

## 2023-03-22 DIAGNOSIS — I10 Essential (primary) hypertension: Secondary | ICD-10-CM

## 2023-03-22 MED ORDER — ALIVE WOMENS 50+ PO TABS
1.0000 | ORAL_TABLET | Freq: Every day | ORAL | 3 refills | Status: AC
  Filled 2023-03-22 – 2023-04-21 (×2): qty 90, fill #0

## 2023-03-23 ENCOUNTER — Other Ambulatory Visit: Payer: Self-pay

## 2023-03-23 MED ORDER — METOPROLOL SUCCINATE ER 50 MG PO TB24
50.0000 mg | ORAL_TABLET | Freq: Every day | ORAL | 3 refills | Status: DC
  Filled 2023-03-23 – 2023-05-08 (×3): qty 90, 90d supply, fill #0
  Filled 2023-08-24 – 2023-09-06 (×2): qty 90, 90d supply, fill #1
  Filled 2023-12-04: qty 90, 90d supply, fill #2
  Filled 2024-02-08 – 2024-03-20 (×4): qty 90, 90d supply, fill #3

## 2023-03-24 ENCOUNTER — Other Ambulatory Visit: Payer: Self-pay

## 2023-03-24 MED ORDER — FUROSEMIDE 20 MG PO TABS
20.0000 mg | ORAL_TABLET | Freq: Every day | ORAL | 0 refills | Status: DC
  Filled 2023-03-24: qty 30, 30d supply, fill #0

## 2023-03-29 ENCOUNTER — Encounter: Payer: Medicaid Other | Admitting: Rehabilitative and Restorative Service Providers"

## 2023-04-04 ENCOUNTER — Ambulatory Visit: Payer: Self-pay

## 2023-04-04 ENCOUNTER — Telehealth: Payer: Self-pay | Admitting: Physician Assistant

## 2023-04-04 ENCOUNTER — Encounter: Payer: Self-pay | Admitting: Nurse Practitioner

## 2023-04-04 NOTE — Telephone Encounter (Signed)
Patient asking to speak to [facility]

## 2023-04-04 NOTE — Telephone Encounter (Signed)
Summary: Pt requesting arthritis medication that she can take or that can be prescribed.   Pt requests call back to advise of arthritis medication that she can take or that can be prescribed. Cb# [phone]      3rd attempt made to contact patient on [phone] to review arthritis pain and what medication can be taken and /or prescribed. No answer, LVMTCB [phone]. Please advise.      Reason for Disposition  Third attempt to contact caller AND no contact made. Phone number verified.  Answer Assessment - Initial Assessment Questions N/A Requesting medication for arthritis pain that can be prescribed or taken.  Protocols used: No Contact or Duplicate Contact Call-A-AH

## 2023-04-04 NOTE — Telephone Encounter (Signed)
Message from [place] sent at 04/04/2023  2:02 PM EDT  Summary: Pt requesting arthritis medication that she can take or that can be prescribed.   Pt requests call back to advise of arthritis medication that she can take or that can be prescribed. Cb# [phone]        Called pt and LM on VM to call back to discuss sx.

## 2023-04-04 NOTE — Telephone Encounter (Signed)
Spoke with patient . Verified name & DOB    Patient voiced that her knees are hurting and she tried topical creams , tylenol and IBU. Patient voiced that she going to start therapy tomorrow. Voiced that she also could not find the number the her orthopedic Doctor. Ortho number given to patient. Patient advised to proceed with PT as order and contact Ortho office for further advice.

## 2023-04-05 ENCOUNTER — Encounter: Payer: Self-pay | Admitting: Rehabilitative and Restorative Service Providers"

## 2023-04-05 ENCOUNTER — Other Ambulatory Visit: Payer: Self-pay | Admitting: Cardiovascular Disease

## 2023-04-05 ENCOUNTER — Other Ambulatory Visit: Payer: Self-pay

## 2023-04-05 ENCOUNTER — Ambulatory Visit: Payer: Medicaid Other | Attending: Physician Assistant | Admitting: Rehabilitative and Restorative Service Providers"

## 2023-04-05 DIAGNOSIS — M6281 Muscle weakness (generalized): Secondary | ICD-10-CM | POA: Diagnosis present

## 2023-04-05 DIAGNOSIS — I480 Paroxysmal atrial fibrillation: Secondary | ICD-10-CM

## 2023-04-05 DIAGNOSIS — R2689 Other abnormalities of gait and mobility: Secondary | ICD-10-CM | POA: Diagnosis present

## 2023-04-05 DIAGNOSIS — R262 Difficulty in walking, not elsewhere classified: Secondary | ICD-10-CM | POA: Diagnosis present

## 2023-04-05 DIAGNOSIS — M25562 Pain in left knee: Secondary | ICD-10-CM | POA: Insufficient documentation

## 2023-04-05 DIAGNOSIS — M25561 Pain in right knee: Secondary | ICD-10-CM | POA: Insufficient documentation

## 2023-04-05 DIAGNOSIS — G8929 Other chronic pain: Secondary | ICD-10-CM | POA: Diagnosis present

## 2023-04-05 NOTE — Telephone Encounter (Signed)
Medicaid does not cover gel injections at all.  We can try TriVisc if she likes.  It comes in a series of 3 and for all 3 syringes for one knee she would have to pay $291.00 and for 6 syringes, it would be $582.00. Please let me know.  Thank you.

## 2023-04-05 NOTE — Therapy (Signed)
OUTPATIENT PHYSICAL THERAPY TREATMENT NOTE   Patient Name: Haley [name] MRN: [id number] DOB:[birth year], 57 y.o., female Today's Date: 04/05/2023  END OF SESSION:  PT End of Session - 04/05/23 1105     Visit Number 2    Date for PT Re-Evaluation 05/12/23    Authorization Type Medicaid [employer]    Authorization - Visit Number 1    Authorization - Number of Visits 27    PT Start Time 1103    PT Stop Time 1141    PT Time Calculation (min) 38 min    Activity Tolerance Patient tolerated treatment well    Behavior During Therapy WFL for tasks assessed/performed             Past Medical History:  Diagnosis Date   Essential hypertension    Fibroids    Hyperthyroidism    a. 2010, treated w/ tapazole.   Morbid obesity (HCC)    Paroxysmal atrial fibrillation (HCC)    a. 03/2009 - in setting of hyperthyroidism and caffeine intake, converted spontaneously;  b. 08/2016 ED visit for recurrent PAF-->successful DCCV in ED.   Snores    Type II diabetes mellitus [facility])    Past Surgical History:  Procedure Laterality Date   UTERINE FIBROID SURGERY     Patient Active Problem List   Diagnosis Date Noted   Bilateral knee pain 02/21/2023   Osteoarthritis of knees, bilateral 02/21/2023   Chronic diastolic CHF (congestive heart failure) (HCC) 02/17/2022   Abnormal vaginal bleeding in postmenopausal patient 11/25/2020   Goiter with hyperthyroidism    UARS (upper airway resistance syndrome)    Hyperthyroidism    Acute diastolic (congestive) heart failure (HCC) 12/21/2019   Hypoxia 12/21/2019   Substernal thyroid goiter 02/19/2019   Current use of long term anticoagulation 02/19/2019   Colon cancer screening 02/19/2019   Pure hypercholesterolemia 02/19/2019   Breast cancer screening 02/19/2019   Class 3 severe obesity due to excess calories with serious comorbidity and body mass index (BMI) of 45.0 to 49.9 in adult (HCC) 01/27/2019   Hypomagnesemia 01/27/2019   Type II diabetes  mellitus (HCC) 01/01/2019   Severe sleep apnea 01/14/2017   Hypoxemia 01/14/2017   Paroxysmal atrial fibrillation (HCC)    Essential hypertension     PCP: Claiborne Rigg, NP  REFERRING PROVIDER: Persons, [place], [place]  REFERRING DIAG: M25.561,M25.562,G89.29 (ICD-10-CM) - Chronic pain of both knees  THERAPY DIAG:  Difficulty in walking, not elsewhere classified  Other abnormalities of gait and mobility  Muscle weakness (generalized)  Rationale for Evaluation and Treatment: Rehabilitation  ONSET DATE: approx 2 years ago  SUBJECTIVE:   SUBJECTIVE STATEMENT: Patient states that she has not been doing her HEP.  States that she is still having some problems with her car.  PERTINENT HISTORY: HTN, DM, OA, obesity  PAIN:  Are you having pain? Yes: NPRS scale: 5-6/10 Pain location: both knees Pain description: aching, sharp, stiff Aggravating factors: sitting for too long, walking for too long Relieving factors: change in position, medication  PRECAUTIONS: None  WEIGHT BEARING RESTRICTIONS: No  FALLS:  Has patient fallen in last 6 months? 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  NEXT MD VISIT: Endocrinologist on 04/06/2023  OBJECTIVE:   DIAGNOSTIC FINDINGS:  Left knee radiograph on 02/20/2023: IMPRESSION: Severe medial and patellofemoral compartment osteoarthritis.  Right knee radiograph on 02/20/2023: IMPRESSION: 1. Severe medial and patellofemoral compartment osteoarthritis, worsened from remote prior. 2. Chronic 12 mm loose body within the suprapatellar joint  space.  PATIENT SURVEYS:  03/15/2023: LEFS 24 / 80 = 30.0 %  COGNITION: Overall cognitive status: Within functional limits for tasks assessed     SENSATION: Eval:  reports numbness and tingling in right thigh   MUSCLE LENGTH: Hamstrings: tightness bilaterally with right more impaired  POSTURE: rounded shoulders and forward head  PALPATION: Tender to palpation along right knee  LOWER EXTREMITY ROM:  03/15/2023: Right knee 3-70 degrees Left knee 0-93 degrees  LOWER EXTREMITY MMT:  03/15/2023: Right hip flexor 3+/5, Right quad 4-/5, Right hamstring 4-/5 Left hip flexor is 3+/5, left quad is 4+/5, left hamstring 4-/5   FUNCTIONAL TESTS:   03/15/2023: 5 times sit to stand: 17.96 sec with UE use Timed up and go (TUG): 13.07 sec  04/05/2023: 6 minute walk test:  587 ft with 2 seated recovery periods  GAIT: Distance walked: >50 ft Assistive device utilized: None Level of assistance: Modified independence Comments: Pt reports that she can walk at least 10 minutes before she starts having knee pain   TODAY'S TREATMENT:                                                                                                                               DATE: 04/05/2023 Nustep level 3 x6 min with PT present to discuss status Seated:  heel/toe raises, marching, LAQ.  2x10 each bilat Seated heel slides with foot with foot on slider 2x10 bilat Seated hamstring stretch 2x20 sec bilat 6 minute walk test:  587 ft with patient requires 2 seated recovery periods secondary to pain and fatigue Seated hip abduction with red tband 2x10 Seated hamstring curl with red tband 2x10 bilat   DATE: 03/15/2023  Reviewed HEP Check all possible CPT codes: 69629 - PT Re-evaluation, 97110- Therapeutic Exercise, [phone]- Neuro Re-education, [phone] - Gait Training, 97140 - Manual Therapy, 97530 - Therapeutic Activities, 97014 - Electrical stimulation (unattended), [id number] - Ultrasound, and [id number] - Aquatic  therapy    Check all conditions that are expected to impact treatment: Musculoskeletal disorders   If treatment provided at initial evaluation, no treatment charged due to lack of authorization.        PATIENT EDUCATION:  Education details: Issued HEP Person educated: Patient Education method: Chief Technology Officer Education comprehension: verbalized understanding and returned demonstration  HOME EXERCISE PROGRAM: Access Code: [email or web address] URL: [email or web address] Date: 03/15/2023 Prepared by: Clydie Braun Alan Riles  Exercises - Seated Hamstring Stretch  - 1 x daily - 7 x weekly - 1 sets - 2 reps - 20 sec hold - Seated March  - 1 x daily - 7 x weekly - 2 sets -  10 reps - Seated Long Arc Quad  - 1 x daily - 7 x weekly - 2 sets - 10 reps - Seated Heel Slide  - 1 x daily - 7 x weekly - 2 sets - 10 reps - Seated Heel Toe Raises  - 1 x daily - 7 x weekly - 2 sets - 10 reps - Standing March with Counter Support  - 1 x daily - 7 x weekly - 2 sets - 10 reps - Standing Hip Abduction with Counter Support  - 1 x daily - 7 x weekly - 2 sets - 10 reps - Standing Hip Extension with Counter Support  - 1 x daily - 7 x weekly - 2 sets - 10 reps  ASSESSMENT:  CLINICAL IMPRESSION: Neesa presents to skilled PT for first visit following initial evaluation.  Patient admits to not performing HEP.  Provided education about the importance of compliance with HEP.  Pt verbalizes understanding and states that she will try to start performing.  Provided pt with information for phone app for MedBridge Go if that would assist her with compliance.  Patient to begin aquatics PT next session and was reminded about location and to arrive early to allow for time walking in and preparing for pool.  Patient able to participate in 6 minute walk, but required 2 seated recovery period, with last one for over a minute as time expired.  Patient continues to require skilled PT to progress towards goal related  activities.  OBJECTIVE IMPAIRMENTS: decreased balance, difficulty walking, decreased ROM, decreased strength, impaired flexibility, improper body mechanics, postural dysfunction, and pain.   ACTIVITY LIMITATIONS: bending, squatting, and stairs  PARTICIPATION LIMITATIONS: community activity and occupation  PERSONAL FACTORS: Past/current experiences, Time since onset of injury/illness/exacerbation, and 3+ comorbidities: HTN, Diabetes, OA  are also affecting patient's functional outcome.   REHAB POTENTIAL: Good  CLINICAL DECISION MAKING: Evolving/moderate complexity  EVALUATION COMPLEXITY: Moderate   GOALS: Goals reviewed with patient? Yes  SHORT TERM GOALS: Target date: 04/07/2023 Patient will be independent with initial HEP. Baseline: Goal status: IN PROGRESS  2.  Patient will be able to participate in a 6 minute walk test to establish a baseline of activity tolerance. Baseline:  Goal status: MET on 04/05/2023   LONG TERM GOALS: Target date: 05/12/2023  Patient will be independent with advanced HEP. Baseline:  Goal status: INITIAL  2.  Patient will increase lower extremity functional scale to at least 50% to demonstrate improvements in functional mobility Baseline: 30% Goal status: INITIAL  3.  Patient will increase right knee A/ROM flexion to at least 95 degrees to allow patient to more easily navigate stairs. Baseline: 3-70 Goal status: INITIAL  4.  Patient will increase bilateral lower extremity strength to at least 4 to 4+/5 to allow her to increase her functional mobility. Baseline:  Goal status: INITIAL  5.  Patient will improve 5 times sit to/from stand to less than 15 seconds to improve her functional mobility. Baseline: 17.96 sec Goal status: INITIAL     PLAN:  PT FREQUENCY: 2x/week  PT DURATION: 8 weeks  PLANNED INTERVENTIONS: Therapeutic exercises, Therapeutic activity, Neuromuscular re-education, Balance training, Gait training, Patient/Family  education, Self Care, Joint mobilization, Joint manipulation, Stair training, Aquatic Therapy, Dry Needling, Electrical stimulation, Spinal manipulation, Spinal mobilization, Cryotherapy, Moist heat, Taping, Ultrasound, Ionotophoresis 4mg /ml Dexamethasone, Manual therapy, and Re-evaluation  PLAN FOR NEXT SESSION: assess and progress HEP as indicated, strengthening, aquatic therapy   Takesha Steger, PT 04/05/2023, 1:42 PM   [facility] [address] [place], [place] [address] Phone # [phone] Fax [phone]

## 2023-04-06 ENCOUNTER — Ambulatory Visit: Payer: Self-pay | Admitting: Internal Medicine

## 2023-04-06 NOTE — Therapy (Signed)
OUTPATIENT PHYSICAL THERAPY TREATMENT NOTE   Patient Name: Haley [name] MRN: [id number] DOB:25-Oct-1966, 57 y.o., female Today's Date: 04/07/2023  END OF SESSION:  PT End of Session - 04/07/23 2134     Visit Number 3    Date for PT Re-Evaluation 05/12/23    Authorization Type Medicaid [employer]    Authorization - Visit Number 2    Authorization - Number of Visits 27    PT Start Time 1430    PT Stop Time 1515    PT Time Calculation (min) 45 min    Activity Tolerance Patient tolerated treatment well    Behavior During Therapy WFL for tasks assessed/performed             Past Medical History:  Diagnosis Date   Essential hypertension    Fibroids    Hyperthyroidism    a. 2010, treated w/ tapazole.   Morbid obesity (HCC)    Paroxysmal atrial fibrillation (HCC)    a. 03/2009 - in setting of hyperthyroidism and caffeine intake, converted spontaneously;  b. 08/2016 ED visit for recurrent PAF-->successful DCCV in ED.   Snores    Type II diabetes mellitus [facility])    Past Surgical History:  Procedure Laterality Date   UTERINE FIBROID SURGERY     Patient Active Problem List   Diagnosis Date Noted   Bilateral knee pain 02/21/2023   Osteoarthritis of knees, bilateral 02/21/2023   Chronic diastolic CHF (congestive heart failure) (HCC) 02/17/2022   Abnormal vaginal bleeding in postmenopausal patient 11/25/2020   Goiter with hyperthyroidism    UARS (upper airway resistance syndrome)    Hyperthyroidism    Acute diastolic (congestive) heart failure (HCC) 12/21/2019   Hypoxia 12/21/2019   Substernal thyroid goiter 02/19/2019   Current use of long term anticoagulation 02/19/2019   Colon cancer screening 02/19/2019   Pure hypercholesterolemia 02/19/2019   Breast cancer screening 02/19/2019   Class 3 severe obesity due to excess calories with serious comorbidity and body mass index (BMI) of 45.0 to 49.9 in adult (HCC) 01/27/2019   Hypomagnesemia 01/27/2019   Type II diabetes  mellitus (HCC) 01/01/2019   Severe sleep apnea 01/14/2017   Hypoxemia 01/14/2017   Paroxysmal atrial fibrillation (HCC)    Essential hypertension     PCP: Claiborne Rigg, NP  REFERRING PROVIDER: Persons, [place], [place]  REFERRING DIAG: M25.561,M25.562,G89.29 (ICD-10-CM) - Chronic pain of both knees  THERAPY DIAG:  Difficulty in walking, not elsewhere classified  Other abnormalities of gait and mobility  Muscle weakness (generalized)  Rationale for Evaluation and Treatment: Rehabilitation  ONSET DATE: approx 2 years ago  SUBJECTIVE:   SUBJECTIVE STATEMENT: Not terrible today PERTINENT HISTORY: HTN, DM, OA, obesity  PAIN:  Are you having pain? Yes: NPRS scale: 3-4/10 Pain location: both knees Pain description: aching, sharp, stiff Aggravating factors: sitting for too long, walking for too long Relieving factors: change in position, medication  PRECAUTIONS: None  WEIGHT BEARING RESTRICTIONS: No  FALLS:  Has patient fallen in last 6 months? 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  NEXT MD VISIT: Endocrinologist on 04/06/2023  OBJECTIVE:   DIAGNOSTIC FINDINGS:  Left knee radiograph  on 02/20/2023: IMPRESSION: Severe medial and patellofemoral compartment osteoarthritis.  Right knee radiograph on 02/20/2023: IMPRESSION: 1. Severe medial and patellofemoral compartment osteoarthritis, worsened from remote prior. 2. Chronic 12 mm loose body within the suprapatellar joint space.  PATIENT SURVEYS:  03/15/2023: LEFS 24 / 80 = 30.0 %  COGNITION: Overall cognitive  status: Within functional limits for tasks assessed     SENSATION: Eval:  reports numbness and tingling in right thigh   MUSCLE LENGTH: Hamstrings: tightness bilaterally with right more impaired  POSTURE: rounded shoulders and forward head  PALPATION: Tender to palpation along right knee  LOWER EXTREMITY ROM:  03/15/2023: Right knee 3-70 degrees Left knee 0-93 degrees  LOWER EXTREMITY MMT:  03/15/2023: Right hip flexor 3+/5, Right quad 4-/5, Right hamstring 4-/5 Left hip flexor is 3+/5, left quad is 4+/5, left hamstring 4-/5   FUNCTIONAL TESTS:   03/15/2023: 5 times sit to stand: 17.96 sec with UE use Timed up and go (TUG): 13.07 sec  04/05/2023: 6 minute walk test:  587 ft with 2 seated recovery periods  GAIT: Distance walked: >50 ft Assistive device utilized: None Level of assistance: Modified independence Comments: Pt reports that she can walk at least 10 minutes before she starts having knee pain   TODAY'S TREATMENT:      04/07/23: Pt arrives for aquatic physical therapy. Treatment took place in 3.5-5.5 feet of water. Water temperature was 90 degrees F. Pt entered the pool reciprocally via steps with moderate use of rails. Pt requires buoyancy of water for support and to offload joints with strengthening exercises.  Seated water bench with 75% submersion Pt performed seated LE AROM exercises 20x in all planes, with concurrent discussion of future plans for water exercise. 75% depth water walking 6 lengths using UE weights for alternating arms in all 4 directions.Hip 3 ways 10x Bil required UE to side of pool for balance. 8 min bicycle on yellow noodle in horseback.                                                                                                                          DATE: 04/05/2023 Nustep level 3 x6 min with PT present to discuss status Seated:  heel/toe raises, marching, LAQ.  2x10 each bilat Seated heel slides with foot with foot on slider 2x10  bilat Seated hamstring stretch 2x20 sec bilat 6 minute walk test:  587 ft with patient requires 2 seated recovery periods secondary to pain and fatigue Seated hip abduction with red tband 2x10 Seated hamstring curl with red tband 2x10 bilat   DATE: 03/15/2023  Reviewed HEP Check all possible CPT codes: [address] - PT Re-evaluation, 97110- Therapeutic Exercise, [phone]- Neuro Re-education, [phone] - Gait Training, [phone] - Manual Therapy, 97530 - Therapeutic Activities, [id number] - Electrical stimulation (unattended), [id number] - Ultrasound, and [id number] - Aquatic therapy    Check all conditions that are expected to impact treatment: Musculoskeletal disorders   If treatment provided at initial evaluation, no treatment charged  due to lack of authorization.        PATIENT EDUCATION:  Education details: Issued HEP Person educated: Patient Education method: Chief Technology Officer Education comprehension: verbalized understanding and returned demonstration  HOME EXERCISE PROGRAM: Access Code: [email or web address] URL: [email or web address] Date: 03/15/2023 Prepared by: Clydie Braun Menke  Exercises - Seated Hamstring Stretch  - 1 x daily - 7 x weekly - 1 sets - 2 reps - 20 sec hold - Seated March  - 1 x daily - 7 x weekly - 2 sets - 10 reps - Seated Long Arc Quad  - 1 x daily - 7 x weekly - 2 sets - 10 reps - Seated Heel Slide  - 1 x daily - 7 x weekly - 2 sets - 10 reps - Seated Heel Toe Raises  - 1 x daily - 7 x weekly - 2 sets - 10 reps - Standing March with Counter Support  - 1 x daily - 7 x weekly - 2 sets - 10 reps - Standing Hip Abduction with Counter Support  - 1 x daily - 7 x weekly - 2 sets - 10 reps - Standing Hip Extension with Counter Support  - 1 x daily - 7 x weekly - 2 sets - 10 reps  ASSESSMENT:  CLINICAL IMPRESSION: Pt arrives for first aquatic PT session. Pt presents with little to no knee pain and was able to maintain throughout the session.   OBJECTIVE IMPAIRMENTS: decreased  balance, difficulty walking, decreased ROM, decreased strength, impaired flexibility, improper body mechanics, postural dysfunction, and pain.   ACTIVITY LIMITATIONS: bending, squatting, and stairs  PARTICIPATION LIMITATIONS: community activity and occupation  PERSONAL FACTORS: Past/current experiences, Time since onset of injury/illness/exacerbation, and 3+ comorbidities: HTN, Diabetes, OA  are also affecting patient's functional outcome.   REHAB POTENTIAL: Good  CLINICAL DECISION MAKING: Evolving/moderate complexity  EVALUATION COMPLEXITY: Moderate   GOALS: Goals reviewed with patient? Yes  SHORT TERM GOALS: Target date: 04/07/2023 Patient will be independent with initial HEP. Baseline: Goal status: IN PROGRESS  2.  Patient will be able to participate in a 6 minute walk test to establish a baseline of activity tolerance. Baseline:  Goal status: MET on 04/05/2023   LONG TERM GOALS: Target date: 05/12/2023  Patient will be independent with advanced HEP. Baseline:  Goal status: INITIAL  2.  Patient will increase lower extremity functional scale to at least 50% to demonstrate improvements in functional mobility Baseline: 30% Goal status: INITIAL  3.  Patient will increase right knee A/ROM flexion to at least 95 degrees to allow patient to more easily navigate stairs. Baseline: 3-70 Goal status: INITIAL  4.  Patient will increase bilateral lower extremity strength to at least 4 to 4+/5 to allow her to increase her functional mobility. Baseline:  Goal status: INITIAL  5.  Patient will improve 5 times sit to/from stand to less than 15 seconds to improve her functional mobility. Baseline: 17.96 sec Goal status: INITIAL     PLAN:  PT FREQUENCY: 2x/week  PT DURATION: 8 weeks  PLANNED INTERVENTIONS: Therapeutic exercises, Therapeutic activity, Neuromuscular re-education, Balance training, Gait training, Patient/Family education, Self Care, Joint mobilization, Joint  manipulation, Stair training, Aquatic Therapy, Dry Needling, Electrical stimulation, Spinal manipulation, Spinal mobilization, Cryotherapy, Moist heat, Taping, Ultrasound, Ionotophoresis 4mg /ml Dexamethasone, Manual therapy, and Re-evaluation  PLAN FOR NEXT SESSION: assess and progress HEP as indicated, strengthening, aquatic therapy   Ane Payment, PTA 04/07/23 9:36 PM   [facility] [address] [place], [place]  [address] Phone # [phone] Fax [phone]

## 2023-04-07 ENCOUNTER — Ambulatory Visit: Payer: Medicaid Other | Admitting: Physical Therapy

## 2023-04-07 ENCOUNTER — Encounter: Payer: Self-pay | Admitting: Physical Therapy

## 2023-04-07 DIAGNOSIS — R262 Difficulty in walking, not elsewhere classified: Secondary | ICD-10-CM

## 2023-04-07 DIAGNOSIS — M6281 Muscle weakness (generalized): Secondary | ICD-10-CM

## 2023-04-07 DIAGNOSIS — R2689 Other abnormalities of gait and mobility: Secondary | ICD-10-CM

## 2023-04-11 NOTE — Therapy (Unsigned)
OUTPATIENT PHYSICAL THERAPY TREATMENT NOTE   Patient Name: Haley [name] MRN: [id number] DOB:Feb 22, 1966, 57 y.o., female Today's Date: 04/12/2023  END OF SESSION:  PT End of Session - 04/12/23 0906     Visit Number 4    Date for PT Re-Evaluation 05/12/23    Authorization Type Medicaid [employer]    Authorization - Visit Number 3    Authorization - Number of Visits 27    PT Start Time 0906   pt late   PT Stop Time 0935    PT Time Calculation (min) 29 min    Activity Tolerance Patient tolerated treatment well    Behavior During Therapy [facility] for tasks assessed/performed              Past Medical History:  Diagnosis Date   Essential hypertension    Fibroids    Hyperthyroidism    a. 2010, treated w/ tapazole.   Morbid obesity (HCC)    Paroxysmal atrial fibrillation (HCC)    a. 03/2009 - in setting of hyperthyroidism and caffeine intake, converted spontaneously;  b. 08/2016 ED visit for recurrent PAF-->successful DCCV in ED.   Snores    Type II diabetes mellitus [facility])    Past Surgical History:  Procedure Laterality Date   UTERINE FIBROID SURGERY     Patient Active Problem List   Diagnosis Date Noted   Bilateral knee pain 02/21/2023   Osteoarthritis of knees, bilateral 02/21/2023   Chronic diastolic CHF (congestive heart failure) (HCC) 02/17/2022   Abnormal vaginal bleeding in postmenopausal patient 11/25/2020   Goiter with hyperthyroidism    UARS (upper airway resistance syndrome)    Hyperthyroidism    Acute diastolic (congestive) heart failure (HCC) 12/21/2019   Hypoxia 12/21/2019   Substernal thyroid goiter 02/19/2019   Current use of long term anticoagulation 02/19/2019   Colon cancer screening 02/19/2019   Pure hypercholesterolemia 02/19/2019   Breast cancer screening 02/19/2019   Class 3 severe obesity due to excess calories with serious comorbidity and body mass index (BMI) of 45.0 to 49.9 in adult (HCC) 01/27/2019   Hypomagnesemia 01/27/2019   Type II  diabetes mellitus (HCC) 01/01/2019   Severe sleep apnea 01/14/2017   Hypoxemia 01/14/2017   Paroxysmal atrial fibrillation (HCC)    Essential hypertension     PCP: Claiborne Rigg, NP  REFERRING PROVIDER: Persons, [place], [place]  REFERRING DIAG: M25.561,M25.562,G89.29 (ICD-10-CM) - Chronic pain of both knees  THERAPY DIAG:  Difficulty in walking, not elsewhere classified  Other abnormalities of gait and mobility  Muscle weakness (generalized)  Rationale for Evaluation and Treatment: Rehabilitation  ONSET DATE: approx 2 years ago  SUBJECTIVE:   SUBJECTIVE STATEMENT: I felt very good for the rest of the day after my first pool session. Pt sorry she was late, over slept.  PERTINENT HISTORY: HTN, DM, OA, obesity  PAIN:  Are you having pain? No  PRECAUTIONS: None  WEIGHT BEARING RESTRICTIONS: No  FALLS:  Has patient fallen in last 6 months? 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  NEXT MD VISIT: Endocrinologist on 04/06/2023  OBJECTIVE:   DIAGNOSTIC FINDINGS:  Left knee radiograph on 02/20/2023: IMPRESSION: Severe  medial and patellofemoral compartment osteoarthritis.  Right knee radiograph on 02/20/2023: IMPRESSION: 1. Severe medial and patellofemoral compartment osteoarthritis, worsened from remote prior. 2. Chronic 12 mm loose body within the suprapatellar joint space.  PATIENT SURVEYS:  03/15/2023: LEFS 24 / 80 = 30.0 %  COGNITION: Overall cognitive status: Within functional limits for tasks assessed     SENSATION: Eval:  reports numbness and tingling in  right thigh   MUSCLE LENGTH: Hamstrings: tightness bilaterally with right more impaired  POSTURE: rounded shoulders and forward head  PALPATION: Tender to palpation along right knee  LOWER EXTREMITY ROM:  03/15/2023: Right knee 3-70 degrees Left knee 0-93 degrees  LOWER EXTREMITY MMT:  03/15/2023: Right hip flexor 3+/5, Right quad 4-/5, Right hamstring 4-/5 Left hip flexor is 3+/5, left quad is 4+/5, left hamstring 4-/5   FUNCTIONAL TESTS:   03/15/2023: 5 times sit to stand: 17.96 sec with UE use Timed up and go (TUG): 13.07 sec  04/05/2023: 6 minute walk test:  587 ft with 2 seated recovery periods  GAIT: Distance walked: >50 ft Assistive device utilized: None Level of assistance: Modified independence Comments: Pt reports that she can walk at least 10 minutes before she starts having knee pain   TODAY'S TREATMENT:    04/12/23:   Pt arrives for aquatic physical therapy. Treatment took place in 3.5-5.5 feet of water. Water temperature was 90 degrees F. Pt entered the pool reciprocally via steps with moderate use of rails. Pt requires buoyancy of water for support and to offload joints with strengthening exercises.  Seated water bench with 75% submersion Pt performed seated LE AROM exercises 20x in all planes, with concurrent discussion of status and how she felt after her first aquatic session. 75% depth water walking 6 lengths using UE weights for alternating arms in all 4 directions.Hip 3 ways 15x Bil required UE to side of pool for balance. 8 min bicycle on yellow noodle in horseback.                                                                                                                           04/07/23: Pt arrives for aquatic physical therapy. Treatment took place in 3.5-5.5 feet of water. Water temperature was 90 degrees F. Pt entered the pool reciprocally via steps with moderate use of rails. Pt requires buoyancy of water for support and to offload joints with  strengthening exercises.  Seated water bench with 75% submersion Pt performed seated LE AROM exercises 20x in all planes, with concurrent discussion of future plans for water exercise. 75% depth water walking 6 lengths using UE weights for alternating arms in all 4 directions.Hip 3 ways 10x Bil required UE to side of pool for balance. 8 min bicycle on yellow noodle in horseback.  DATE: 04/05/2023 Nustep level 3 x6 min with PT present to discuss status Seated:  heel/toe raises, marching, LAQ.  2x10 each bilat Seated heel slides with foot with foot on slider 2x10 bilat Seated hamstring stretch 2x20 sec bilat 6 minute walk test:  587 ft with patient requires 2 seated recovery periods secondary to pain and fatigue Seated hip abduction with red tband 2x10 Seated hamstring curl with red tband 2x10 bilat   PATIENT EDUCATION:  Education details: Issued HEP Person educated: Patient Education method: Explanation and Handouts Education comprehension: verbalized understanding and returned demonstration  HOME EXERCISE PROGRAM: Access Code: [email or web address] URL: [email or web address] Date: 03/15/2023 Prepared by: Clydie Braun Menke  Exercises - Seated Hamstring Stretch  - 1 x daily - 7 x weekly - 1 sets - 2 reps - 20 sec hold - Seated March  - 1 x daily - 7 x weekly - 2 sets - 10 reps - Seated Long Arc Quad  - 1 x daily - 7 x weekly - 2 sets - 10 reps - Seated Heel Slide  - 1 x daily - 7 x weekly - 2 sets - 10 reps - Seated Heel Toe Raises  - 1 x daily - 7 x weekly - 2 sets - 10 reps - Standing March with Counter Support  - 1 x daily - 7 x weekly - 2 sets - 10 reps - Standing Hip Abduction with Counter Support  - 1 x daily - 7 x weekly - 2 sets - 10 reps - Standing Hip Extension with Counter Support  - 1 x daily - 7 x weekly - 2 sets - 10 reps  ASSESSMENT:  CLINICAL  IMPRESSION: Treatment was somewhat limited today as pt was significantly late from oversleeping. We were able to get all her initial exercises in but did not have time for new exercise introduction. Pt did report her knees felt very good after exercising in the water and that has served as good motivation to be more compliant.  OBJECTIVE IMPAIRMENTS: decreased balance, difficulty walking, decreased ROM, decreased strength, impaired flexibility, improper body mechanics, postural dysfunction, and pain.   ACTIVITY LIMITATIONS: bending, squatting, and stairs  PARTICIPATION LIMITATIONS: community activity and occupation  PERSONAL FACTORS: Past/current experiences, Time since onset of injury/illness/exacerbation, and 3+ comorbidities: HTN, Diabetes, OA  are also affecting patient's functional outcome.   REHAB POTENTIAL: Good  CLINICAL DECISION MAKING: Evolving/moderate complexity  EVALUATION COMPLEXITY: Moderate   GOALS: Goals reviewed with patient? Yes  SHORT TERM GOALS: Target date: 04/07/2023 Patient will be independent with initial HEP. Baseline: Goal status: IN PROGRESS  2.  Patient will be able to participate in a 6 minute walk test to establish a baseline of activity tolerance. Baseline:  Goal status: MET on 04/05/2023   LONG TERM GOALS: Target date: 05/12/2023  Patient will be independent with advanced HEP. Baseline:  Goal status: INITIAL  2.  Patient will increase lower extremity functional scale to at least 50% to demonstrate improvements in functional mobility Baseline: 30% Goal status: INITIAL  3.  Patient will increase right knee A/ROM flexion to at least 95 degrees to allow patient to more easily navigate stairs. Baseline: 3-70 Goal status: INITIAL  4.  Patient will increase bilateral lower extremity strength to at least 4 to 4+/5 to allow her to increase her functional mobility. Baseline:  Goal status: INITIAL  5.  Patient will improve 5 times sit to/from stand to  less than 15 seconds to improve her functional mobility. Baseline: 17.96 sec Goal status:  INITIAL     PLAN:  PT FREQUENCY: 2x/week  PT DURATION: 8 weeks  PLANNED INTERVENTIONS: Therapeutic exercises, Therapeutic activity, Neuromuscular re-education, Balance training, Gait training, Patient/Family education, Self Care, Joint mobilization, Joint manipulation, Stair training, Aquatic Therapy, Dry Needling, Electrical stimulation, Spinal manipulation, Spinal mobilization, Cryotherapy, Moist heat, Taping, Ultrasound, Ionotophoresis 4mg /ml Dexamethasone, Manual therapy, and Re-evaluation  PLAN FOR NEXT SESSION: Introduce ankle fins for increased resistance.   Ane Payment, PTA 04/12/23 10:12 AM   [facility] [address] [place], [place] [address] Phone # [phone] Fax [phone]

## 2023-04-12 ENCOUNTER — Ambulatory Visit: Payer: Medicaid Other | Admitting: Physical Therapy

## 2023-04-12 ENCOUNTER — Encounter: Payer: Self-pay | Admitting: Physical Therapy

## 2023-04-12 DIAGNOSIS — R262 Difficulty in walking, not elsewhere classified: Secondary | ICD-10-CM

## 2023-04-12 DIAGNOSIS — M6281 Muscle weakness (generalized): Secondary | ICD-10-CM

## 2023-04-12 DIAGNOSIS — R2689 Other abnormalities of gait and mobility: Secondary | ICD-10-CM

## 2023-04-14 ENCOUNTER — Other Ambulatory Visit: Payer: Self-pay

## 2023-04-14 ENCOUNTER — Other Ambulatory Visit: Payer: Self-pay | Admitting: Nurse Practitioner

## 2023-04-14 ENCOUNTER — Ambulatory Visit: Payer: Medicaid Other | Admitting: Rehabilitative and Restorative Service Providers"

## 2023-04-14 NOTE — Telephone Encounter (Signed)
Requested medications are due for refill today.  yes  Requested medications are on the active medications list.  yes  Last refill. 03/24/2023 #30 0 rf  Future visit scheduled.   no  Notes to clinic.  Missing Mg lab.    Requested Prescriptions  Pending Prescriptions Disp Refills   furosemide (LASIX) 20 MG tablet 30 tablet 0    Sig: Take 1 tablet (20 mg total) by mouth daily.     Cardiovascular:  Diuretics - Loop Failed - 04/14/2023  4:11 PM      Failed - Mg Level in normal range and within 180 days    Magnesium  Date Value Ref Range Status  12/24/2019 1.9 1.7 - 2.4 mg/dL Final    Comment:    Performed at [facility], [address], [place], [place] [address]         Failed - Last BP in normal range    BP Readings from Last 1 Encounters:  02/06/23 (!) 147/78         Passed - K in normal range and within 180 days    Potassium  Date Value Ref Range Status  12/16/2022 4.6 3.5 - 5.2 mmol/L Final         Passed - Ca in normal range and within 180 days    Calcium  Date Value Ref Range Status  12/16/2022 10.0 8.7 - 10.2 mg/dL Final   Calcium, Ion  Date Value Ref Range Status  03/14/2009 1.16 1.12 - 1.32 mmol/L Final         Passed - Na in normal range and within 180 days    Sodium  Date Value Ref Range Status  12/16/2022 144 134 - 144 mmol/L Final         Passed - Cr in normal range and within 180 days    Creatinine, Ser  Date Value Ref Range Status  12/16/2022 0.64 0.57 - 1.00 mg/dL Final         Passed - Cl in normal range and within 180 days    Chloride  Date Value Ref Range Status  12/16/2022 104 96 - 106 mmol/L Final         Passed - Valid encounter within last 6 months    Recent Outpatient Visits           1 month ago Chronic pain of both knees   [place] [facility] [place], Shea Stakes, NP   9 months ago Insomnia secondary to situational depression   [facility] [place], Shea Stakes, NP    10 months ago Chronic pain of both knees   [place] [facility] [place], Shea Stakes, NP   1 year ago Essential hypertension   [place] [facility] [place], Shea Stakes, NP   1 year ago [facility] disease   [facility] [place], [place], [place]

## 2023-04-18 ENCOUNTER — Encounter (HOSPITAL_BASED_OUTPATIENT_CLINIC_OR_DEPARTMENT_OTHER): Payer: Self-pay | Admitting: Physical Therapy

## 2023-04-18 ENCOUNTER — Other Ambulatory Visit: Payer: Self-pay

## 2023-04-18 ENCOUNTER — Ambulatory Visit (HOSPITAL_BASED_OUTPATIENT_CLINIC_OR_DEPARTMENT_OTHER): Payer: Medicaid Other | Attending: Physician Assistant | Admitting: Physical Therapy

## 2023-04-18 DIAGNOSIS — M6281 Muscle weakness (generalized): Secondary | ICD-10-CM | POA: Diagnosis present

## 2023-04-18 DIAGNOSIS — G8929 Other chronic pain: Secondary | ICD-10-CM | POA: Insufficient documentation

## 2023-04-18 DIAGNOSIS — R262 Difficulty in walking, not elsewhere classified: Secondary | ICD-10-CM | POA: Diagnosis present

## 2023-04-18 DIAGNOSIS — M25561 Pain in right knee: Secondary | ICD-10-CM | POA: Diagnosis present

## 2023-04-18 DIAGNOSIS — M25562 Pain in left knee: Secondary | ICD-10-CM | POA: Diagnosis present

## 2023-04-18 DIAGNOSIS — R2689 Other abnormalities of gait and mobility: Secondary | ICD-10-CM | POA: Insufficient documentation

## 2023-04-18 NOTE — Therapy (Signed)
OUTPATIENT PHYSICAL THERAPY TREATMENT NOTE   Patient Name: Haley [name] MRN: [id number] DOB:03-09-1966, 57 y.o., female Today's Date: 04/18/2023  END OF SESSION:  PT End of Session - 04/18/23 1046     Visit Number 5    Date for PT Re-Evaluation 05/12/23    Authorization Type Medicaid [employer]    Authorization - Number of Visits 27    PT Start Time 1040   pt arrived late   PT Stop Time 1115    PT Time Calculation (min) 35 min    Activity Tolerance Patient tolerated treatment well    Behavior During Therapy WFL for tasks assessed/performed              Past Medical History:  Diagnosis Date   Essential hypertension    Fibroids    Hyperthyroidism    a. 2010, treated w/ tapazole.   Morbid obesity (HCC)    Paroxysmal atrial fibrillation (HCC)    a. 03/2009 - in setting of hyperthyroidism and caffeine intake, converted spontaneously;  b. 08/2016 ED visit for recurrent PAF-->successful DCCV in ED.   Snores    Type II diabetes mellitus (HCC)    Past Surgical History:  Procedure Laterality Date   UTERINE FIBROID SURGERY     Patient Active Problem List   Diagnosis Date Noted   Bilateral knee pain 02/21/2023   Osteoarthritis of knees, bilateral 02/21/2023   Chronic diastolic CHF (congestive heart failure) (HCC) 02/17/2022   Abnormal vaginal bleeding in postmenopausal patient 11/25/2020   Goiter with hyperthyroidism    UARS (upper airway resistance syndrome)    Hyperthyroidism    Acute diastolic (congestive) heart failure (HCC) 12/21/2019   Hypoxia 12/21/2019   Substernal thyroid goiter 02/19/2019   Current use of long term anticoagulation 02/19/2019   Colon cancer screening 02/19/2019   Pure hypercholesterolemia 02/19/2019   Breast cancer screening 02/19/2019   Class 3 severe obesity due to excess calories with serious comorbidity and body mass index (BMI) of 45.0 to 49.9 in adult (HCC) 01/27/2019   Hypomagnesemia 01/27/2019   Type II diabetes mellitus (HCC)  01/01/2019   Severe sleep apnea 01/14/2017   Hypoxemia 01/14/2017   Paroxysmal atrial fibrillation (HCC)    Essential hypertension     PCP: Claiborne Rigg, NP  REFERRING PROVIDER: Persons, [place], [place]  REFERRING DIAG: M25.561,M25.562,G89.29 (ICD-10-CM) - Chronic pain of both knees  THERAPY DIAG:  Difficulty in walking, not elsewhere classified  Other abnormalities of gait and mobility  Muscle weakness (generalized)  Rationale for Evaluation and Treatment: Rehabilitation  ONSET DATE: approx 2 years ago  SUBJECTIVE:   SUBJECTIVE STATEMENT: Pt reports her knees feel pretty good today, but "hips are annoying".  She reports her back had been bothering her lately as well.   PERTINENT HISTORY: HTN, DM, OA, obesity  PAIN:  Are you having pain? Yes Pain rating: 3-4/10 Location:  bilat hips Description: achy, tight  PRECAUTIONS: None  WEIGHT BEARING RESTRICTIONS: No  FALLS:  Has patient fallen in last 6 months? 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  NEXT MD VISIT: Endocrinologist on 04/06/2023  OBJECTIVE:   DIAGNOSTIC FINDINGS:  Left  knee radiograph on 02/20/2023: IMPRESSION: Severe medial and patellofemoral compartment osteoarthritis.  Right knee radiograph on 02/20/2023: IMPRESSION: 1. Severe medial and patellofemoral compartment osteoarthritis, worsened from remote prior. 2. Chronic 12 mm loose body within the suprapatellar joint space.  PATIENT SURVEYS:  03/15/2023: LEFS 24 / 80 = 30.0 %  COGNITION: Overall cognitive status: Within functional limits for  tasks assessed     SENSATION: Eval:  reports numbness and tingling in right thigh   MUSCLE LENGTH: Hamstrings: tightness bilaterally with right more impaired  POSTURE: rounded shoulders and forward head  PALPATION: Tender to palpation along right knee  LOWER EXTREMITY ROM:  03/15/2023: Right knee 3-70 degrees Left knee 0-93 degrees  LOWER EXTREMITY MMT:  03/15/2023: Right hip flexor 3+/5, Right quad 4-/5, Right hamstring 4-/5 Left hip flexor is 3+/5, left quad is 4+/5, left hamstring 4-/5   FUNCTIONAL TESTS:   03/15/2023: 5 times sit to stand: 17.96 sec with UE use Timed up and go (TUG): 13.07 sec  04/05/2023: 6 minute walk test:  587 ft with 2 seated recovery periods  GAIT: Distance walked: >50 ft Assistive device utilized: None Level of assistance: Modified independence Comments: Pt reports that she can walk at least 10 minutes before she starts having knee pain   TODAY'S TREATMENT:   04/18/23: Pt seen for aquatic therapy today.  Treatment took place in water 3.5-4.75 ft in depth at the [facility] pool. Temp of water was 91.  Pt entered/exited the pool via stairs independent in step-to pattern with bilat rail. - unsupported walking forward/ backward with cues for vertical trunk and less compensatory hip strategies - side stepping with arm addct/ abdct - without resistance, -> rainbow -> yellow hand floats - high knee marching with reciprocal arm swing - holding wall: alternating single leg clams x 10; hip openers/ crosses x 10;  - TrA set with short noodle -> solid noodle x 15 - UE on solid noodle leg swings into hip abdct/ addct x 10; hip flex/ext x 10 - return to walking forward / backward - STS at bench in water with feet on blue step with cues for hip hinge and controlled descent to bench x 7   Pt requires the buoyancy and hydrostatic pressure of water for support, and to offload joints by unweighting joint load by at least 50 % in navel deep water  and by at least 75-80% in chest to neck deep water.  Viscosity of the water is needed for resistance of strengthening. Water current perturbations provides challenge to standing balance requiring increased core activation.   04/12/23:   Pt arrives for aquatic physical therapy. Treatment took place in 3.5-5.5 feet of water. Water temperature was 90 degrees F. Pt entered the pool reciprocally via steps with moderate use of rails. Pt requires buoyancy of water for support and to offload joints with strengthening exercises.  Seated water bench with 75% submersion Pt performed seated LE AROM exercises 20x in all planes, with concurrent discussion of status and how she felt after her first aquatic session. 75% depth water walking 6 lengths using UE weights for alternating arms in all 4 directions.Hip 3 ways 15x Bil required UE to side of pool for balance. 8 min bicycle on yellow noodle in horseback.  04/07/23: Pt arrives for aquatic physical therapy. Treatment took place in 3.5-5.5 feet of water. Water temperature was 90 degrees F. Pt entered the pool reciprocally via steps with moderate use of rails. Pt requires buoyancy of water for support and to offload joints with strengthening exercises.  Seated water bench with 75% submersion Pt performed seated LE AROM exercises 20x in all planes, with concurrent discussion of future plans for water exercise. 75% depth water walking 6 lengths using UE weights for alternating arms in all 4 directions.Hip 3 ways 10x Bil required UE to side of pool for balance. 8 min bicycle on yellow noodle in horseback.                                                                                                                          DATE: 04/05/2023 Nustep level 3 x6 min with PT present to discuss status Seated:  heel/toe raises, marching, LAQ.  2x10 each  bilat Seated heel slides with foot with foot on slider 2x10 bilat Seated hamstring stretch 2x20 sec bilat 6 minute walk test:  587 ft with patient requires 2 seated recovery periods secondary to pain and fatigue Seated hip abduction with red tband 2x10 Seated hamstring curl with red tband 2x10 bilat   PATIENT EDUCATION:  Education details: Issued HEP Person educated: Patient Education method: Explanation and Handouts Education comprehension: verbalized understanding and returned demonstration  HOME EXERCISE PROGRAM: Access Code: [email or web address] URL: [email or web address] Date: 03/15/2023 Prepared by: Clydie Braun Menke  Exercises - Seated Hamstring Stretch  - 1 x daily - 7 x weekly - 1 sets - 2 reps - 20 sec hold - Seated March  - 1 x daily - 7 x weekly - 2 sets - 10 reps - Seated Long Arc Quad  - 1 x daily - 7 x weekly - 2 sets - 10 reps - Seated Heel Slide  - 1 x daily - 7 x weekly - 2 sets - 10 reps - Seated Heel Toe Raises  - 1 x daily - 7 x weekly - 2 sets - 10 reps - Standing March with Counter Support  - 1 x daily - 7 x weekly - 2 sets - 10 reps - Standing Hip Abduction with Counter Support  - 1 x daily - 7 x weekly - 2 sets - 10 reps - Standing Hip Extension with Counter Support  - 1 x daily - 7 x weekly - 2 sets - 10 reps  ASSESSMENT:  CLINICAL IMPRESSION: Treatment was somewhat limited today due to pt's late arrival.  Discussed the importance of good body mechanics with work and home life (bending at knees/hips vs just from the back). Pt requires cues for core engagement and upright posture.   Pt reported slight reduction of pain at end of session.  Encouraged pt to seek pool to join after completion of therapy so that she can continue aquatic HEP exercises.  Goals are ongoing.   OBJECTIVE IMPAIRMENTS: decreased balance, difficulty walking,  decreased ROM, decreased strength, impaired flexibility, improper body mechanics, postural dysfunction, and pain.   ACTIVITY  LIMITATIONS: bending, squatting, and stairs  PARTICIPATION LIMITATIONS: community activity and occupation  PERSONAL FACTORS: Past/current experiences, Time since onset of injury/illness/exacerbation, and 3+ comorbidities: HTN, Diabetes, OA  are also affecting patient's functional outcome.   REHAB POTENTIAL: Good  CLINICAL DECISION MAKING: Evolving/moderate complexity  EVALUATION COMPLEXITY: Moderate   GOALS: Goals reviewed with patient? Yes  SHORT TERM GOALS: Target date: 04/07/2023 Patient will be independent with initial HEP. Baseline: Goal status: IN PROGRESS  2.  Patient will be able to participate in a 6 minute walk test to establish a baseline of activity tolerance. Baseline:  Goal status: MET on 04/05/2023   LONG TERM GOALS: Target date: 05/12/2023  Patient will be independent with advanced HEP. Baseline:  Goal status: INITIAL  2.  Patient will increase lower extremity functional scale to at least 50% to demonstrate improvements in functional mobility Baseline: 30% Goal status: INITIAL  3.  Patient will increase right knee A/ROM flexion to at least 95 degrees to allow patient to more easily navigate stairs. Baseline: 3-70 Goal status: INITIAL  4.  Patient will increase bilateral lower extremity strength to at least 4 to 4+/5 to allow her to increase her functional mobility. Baseline:  Goal status: INITIAL  5.  Patient will improve 5 times sit to/from stand to less than 15 seconds to improve her functional mobility. Baseline: 17.96 sec Goal status: INITIAL     PLAN:  PT FREQUENCY: 2x/week  PT DURATION: 8 weeks  PLANNED INTERVENTIONS: Therapeutic exercises, Therapeutic activity, Neuromuscular re-education, Balance training, Gait training, Patient/Family education, Self Care, Joint mobilization, Joint manipulation, Stair training, Aquatic Therapy, Dry Needling, Electrical stimulation, Spinal manipulation, Spinal mobilization, Cryotherapy, Moist heat, Taping,  Ultrasound, Ionotophoresis 4mg /ml Dexamethasone, Manual therapy, and Re-evaluation  PLAN FOR NEXT SESSION: continue progressive aquatic exercises.   Mayer Camel, PTA 04/18/23 3:26 PM [facility] [address] [place], [place], [address] Phone: [phone]   Fax:  [phone]

## 2023-04-19 ENCOUNTER — Encounter: Payer: Medicaid Other | Admitting: Rehabilitative and Restorative Service Providers"

## 2023-04-20 ENCOUNTER — Encounter: Payer: Self-pay | Admitting: Rehabilitative and Restorative Service Providers"

## 2023-04-21 ENCOUNTER — Ambulatory Visit (HOSPITAL_BASED_OUTPATIENT_CLINIC_OR_DEPARTMENT_OTHER): Payer: Medicaid Other | Admitting: Physical Therapy

## 2023-04-21 ENCOUNTER — Other Ambulatory Visit: Payer: Self-pay | Admitting: Nurse Practitioner

## 2023-04-21 ENCOUNTER — Other Ambulatory Visit: Payer: Self-pay | Admitting: Family Medicine

## 2023-04-21 ENCOUNTER — Other Ambulatory Visit: Payer: Self-pay

## 2023-04-21 ENCOUNTER — Ambulatory Visit: Payer: Medicaid Other | Admitting: Physical Therapy

## 2023-04-21 ENCOUNTER — Other Ambulatory Visit: Payer: Self-pay | Admitting: Physician Assistant

## 2023-04-21 DIAGNOSIS — R2689 Other abnormalities of gait and mobility: Secondary | ICD-10-CM

## 2023-04-21 DIAGNOSIS — M6281 Muscle weakness (generalized): Secondary | ICD-10-CM

## 2023-04-21 DIAGNOSIS — R262 Difficulty in walking, not elsewhere classified: Secondary | ICD-10-CM | POA: Diagnosis not present

## 2023-04-21 DIAGNOSIS — E1169 Type 2 diabetes mellitus with other specified complication: Secondary | ICD-10-CM

## 2023-04-21 DIAGNOSIS — E0590 Thyrotoxicosis, unspecified without thyrotoxic crisis or storm: Secondary | ICD-10-CM

## 2023-04-21 MED ORDER — METHIMAZOLE 5 MG PO TABS
5.0000 mg | ORAL_TABLET | Freq: Every day | ORAL | 0 refills | Status: DC
  Filled 2023-04-21: qty 90, 90d supply, fill #0

## 2023-04-21 MED ORDER — FUROSEMIDE 20 MG PO TABS
20.0000 mg | ORAL_TABLET | Freq: Every day | ORAL | 0 refills | Status: DC
  Filled 2023-04-21: qty 90, 90d supply, fill #0

## 2023-04-21 NOTE — Telephone Encounter (Signed)
Unable to refill per protocol, appointment needed  Requested Prescriptions  Pending Prescriptions Disp Refills   lisinopril (ZESTRIL) 2.5 MG tablet 30 tablet 1    Sig: Take 1 tablet (2.5 mg total) by mouth daily.     There is no refill protocol information for this order

## 2023-04-21 NOTE — Therapy (Signed)
OUTPATIENT PHYSICAL THERAPY TREATMENT NOTE   Patient Name: SHYNICE WOODHOUSE MRN: [id number] DOB:1966/04/27, 57 y.o., female Today's Date: 04/21/2023  END OF SESSION:  PT End of Session - 04/21/23 0941     Visit Number 6    Date for PT Re-Evaluation 05/12/23    Authorization Type Medicaid [employer]    Authorization - Number of Visits 27    PT Start Time [phone]    PT Stop Time 1015   late arrival   PT Time Calculation (min) 34 min    Activity Tolerance Patient tolerated treatment well    Behavior During Therapy [facility] for tasks assessed/performed               Past Medical History:  Diagnosis Date   Essential hypertension    Fibroids    Hyperthyroidism    a. 2010, treated w/ tapazole.   Morbid obesity (HCC)    Paroxysmal atrial fibrillation (HCC)    a. 03/2009 - in setting of hyperthyroidism and caffeine intake, converted spontaneously;  b. 08/2016 ED visit for recurrent PAF-->successful DCCV in ED.   Snores    Type II diabetes mellitus [facility])    Past Surgical History:  Procedure Laterality Date   UTERINE FIBROID SURGERY     Patient Active Problem List   Diagnosis Date Noted   Bilateral knee pain 02/21/2023   Osteoarthritis of knees, bilateral 02/21/2023   Chronic diastolic CHF (congestive heart failure) (HCC) 02/17/2022   Abnormal vaginal bleeding in postmenopausal patient 11/25/2020   Goiter with hyperthyroidism    UARS (upper airway resistance syndrome)    Hyperthyroidism    Acute diastolic (congestive) heart failure (HCC) 12/21/2019   Hypoxia 12/21/2019   Substernal thyroid goiter 02/19/2019   Current use of long term anticoagulation 02/19/2019   Colon cancer screening 02/19/2019   Pure hypercholesterolemia 02/19/2019   Breast cancer screening 02/19/2019   Class 3 severe obesity due to excess calories with serious comorbidity and body mass index (BMI) of 45.0 to 49.9 in adult (HCC) 01/27/2019   Hypomagnesemia 01/27/2019   Type II diabetes mellitus (HCC)  01/01/2019   Severe sleep apnea 01/14/2017   Hypoxemia 01/14/2017   Paroxysmal atrial fibrillation (HCC)    Essential hypertension     PCP: Claiborne Rigg, NP  REFERRING PROVIDER: Persons, [place], [place]  REFERRING DIAG: M25.561,M25.562,G89.29 (ICD-10-CM) - Chronic pain of both knees  THERAPY DIAG:  Difficulty in walking, not elsewhere classified  Other abnormalities of gait and mobility  Muscle weakness (generalized)  Rationale for Evaluation and Treatment: Rehabilitation  ONSET DATE: approx 2 years ago  SUBJECTIVE:   SUBJECTIVE STATEMENT: Pt states R knee is "a little wonky." 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 Has been doing her exercises.   PERTINENT HISTORY: HTN, DM, OA, obesity  PAIN:  Are you having pain? Yes Pain rating: 6/10  Location:  R knee Description: achy, tight  PRECAUTIONS: None  WEIGHT BEARING RESTRICTIONS: No  FALLS:  Has patient fallen in last 6 months? 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  NEXT MD VISIT: Endocrinologist on 04/06/2023  OBJECTIVE: (Measures in this section  from initial evaluation unless otherwise noted)  DIAGNOSTIC FINDINGS:  Left knee radiograph on 02/20/2023: IMPRESSION: Severe medial and patellofemoral compartment osteoarthritis.  Right knee radiograph on 02/20/2023: IMPRESSION: 1. Severe medial and patellofemoral compartment osteoarthritis, worsened from remote prior. 2. Chronic 12 mm loose body within the suprapatellar joint space.  PATIENT SURVEYS:  03/15/2023: LEFS 24 / 80 =  30.0 %   SENSATION: Eval:  reports numbness and tingling in right thigh   MUSCLE LENGTH: Hamstrings: tightness bilaterally with right more impaired  POSTURE: rounded shoulders and forward head  PALPATION: Tender to palpation along right knee  LOWER EXTREMITY ROM:  03/15/2023: Right knee 3-70 degrees Left knee 0-93 degrees  04/21/23:  R knee flexion 88 deg L knee flexion 100 deg  LOWER EXTREMITY MMT:  03/15/2023: Right hip flexor 3+/5, Right quad 4-/5, Right hamstring 4-/5 Left hip flexor is 3+/5, left quad is 4+/5, left hamstring 4-/5   FUNCTIONAL TESTS:   03/15/2023: 5 times sit to stand: 17.96 sec with UE use Timed up and go (TUG): 13.07 sec  04/05/2023: 6 minute walk test:  587 ft with 2 seated recovery periods  GAIT: Distance walked: >50 ft Assistive device utilized: None Level of assistance: Modified independence Comments: Pt reports that she can walk at least 10 minutes before she starts having knee pain   TODAY'S TREATMENT:   04/21/23: Therapeutic Exercise Nustep L5 x 5 min UEs/LEs Seated hamstring stretch x30 sec Seated heel slide on slider 2x10 Seated knee flexion self stretch x30 sec Standing hip abd red TB 2x10 Standing hip ext red TB 2x10 Standing marching red TB 2x10 Standing heel raises 2x10 Standing hamstring curl red TB 2x10 Sit<>stand x10 Seated SLR x10 Manual Therapy Grade II to III joint mobilization for knee flexion  04/18/23: Pt seen for aquatic therapy today.  Treatment took place in water 3.5-4.75 ft in depth at the [facility] pool. Temp of water was 91.  Pt entered/exited the pool via stairs independent in step-to pattern with bilat rail. - unsupported walking forward/ backward with cues for vertical trunk and less compensatory hip strategies - side stepping with arm addct/ abdct - without resistance, -> rainbow -> yellow hand floats - high knee marching with reciprocal arm swing - holding wall: alternating single leg  clams x 10; hip openers/ crosses x 10;  - TrA set with short noodle -> solid noodle x 15 - UE on solid noodle leg swings into hip abdct/ addct x 10; hip flex/ext x 10 - return to walking forward / backward - STS at bench in water with feet on blue step with cues for hip hinge and controlled descent to bench x 7   Pt requires the buoyancy and hydrostatic pressure of water for support, and to offload joints by unweighting joint load by at least 50 % in navel deep water and by at least 75-80% in chest to neck deep water.  Viscosity of the water is needed for resistance of strengthening. Water current perturbations provides challenge to standing balance requiring increased core activation.   04/12/23:   Pt arrives for aquatic physical therapy. Treatment took place in 3.5-5.5 feet of water. Water temperature was 90 degrees F. Pt entered the pool reciprocally via steps with moderate use of rails. Pt requires buoyancy of water for support and to offload joints with strengthening exercises.  Seated water bench with 75% submersion Pt performed seated LE AROM exercises 20x in all planes, with concurrent discussion of status and how she felt after  her first aquatic session. 75% depth water walking 6 lengths using UE weights for alternating arms in all 4 directions.Hip 3 ways 15x Bil required UE to side of pool for balance. 8 min bicycle on yellow noodle in horseback.                                                                                                                           04/07/23: Pt arrives for aquatic physical therapy. Treatment took place in 3.5-5.5 feet of water. Water temperature was 90 degrees F. Pt entered the pool reciprocally via steps with moderate use of rails. Pt requires buoyancy of water for support and to offload joints with strengthening exercises.  Seated water bench with 75% submersion Pt performed seated LE AROM exercises 20x in all planes, with concurrent discussion of future  plans for water exercise. 75% depth water walking 6 lengths using UE weights for alternating arms in all 4 directions.Hip 3 ways 10x Bil required UE to side of pool for balance. 8 min bicycle on yellow noodle in horseback.                                                                                                                          DATE: 04/05/2023 Nustep level 3 x6 min with PT present to discuss status Seated:  heel/toe raises, marching, LAQ.  2x10 each bilat Seated heel slides with foot with foot on slider 2x10 bilat Seated hamstring stretch 2x20 sec bilat 6 minute walk test:  587 ft with patient requires 2 seated recovery periods secondary to pain and fatigue Seated hip abduction with red tband 2x10 Seated hamstring curl with red tband 2x10 bilat   PATIENT EDUCATION:  Education details: Issued HEP Person educated: Patient Education method: Explanation and Handouts Education comprehension: verbalized understanding and returned demonstration  HOME EXERCISE PROGRAM: Access Code: [email or web address] URL: [email or web address] Date: 03/15/2023 Prepared by: Clydie Braun Menke  Exercises - Seated Hamstring Stretch  - 1 x daily - 7 x weekly - 1 sets - 2 reps - 20 sec hold - Seated March  - 1 x daily - 7 x weekly - 2 sets - 10 reps - Seated Long Arc Quad  - 1 x daily - 7 x weekly - 2 sets - 10 reps - Seated Heel Slide  - 1 x daily - 7 x weekly - 2 sets - 10 reps - Seated Heel Toe Raises  - 1 x daily - 7  x weekly - 2 sets - 10 reps - Standing March with Counter Support  - 1 x daily - 7 x weekly - 2 sets - 10 reps - Standing Hip Abduction with Counter Support  - 1 x daily - 7 x weekly - 2 sets - 10 reps - Standing Hip Extension with Counter Support  - 1 x daily - 7 x weekly - 2 sets - 10 reps  ASSESSMENT:  CLINICAL IMPRESSION: Truncated session due to late arrival. Worked on progressing her exercises with resistance. Improving knee ROM noted today. Able to obtain almost 90 deg of  flexion in R knee.   OBJECTIVE IMPAIRMENTS: decreased balance, difficulty walking, decreased ROM, decreased strength, impaired flexibility, improper body mechanics, postural dysfunction, and pain.    GOALS: Goals reviewed with patient? Yes  SHORT TERM GOALS: Target date: 04/07/2023 Patient will be independent with initial HEP. Baseline: Goal status: IN PROGRESS  2.  Patient will be able to participate in a 6 minute walk test to establish a baseline of activity tolerance. Baseline:  Goal status: MET on 04/05/2023   LONG TERM GOALS: Target date: 05/12/2023  Patient will be independent with advanced HEP. Baseline:  Goal status: INITIAL  2.  Patient will increase lower extremity functional scale to at least 50% to demonstrate improvements in functional mobility Baseline: 30% Goal status: INITIAL  3.  Patient will increase right knee A/ROM flexion to at least 95 degrees to allow patient to more easily navigate stairs. Baseline: 3-70 Goal status: IN PROGRESS  4.  Patient will increase bilateral lower extremity strength to at least 4 to 4+/5 to allow her to increase her functional mobility. Baseline:  Goal status: INITIAL  5.  Patient will improve 5 times sit to/from stand to less than 15 seconds to improve her functional mobility. Baseline: 17.96 sec Goal status: INITIAL     PLAN:  PT FREQUENCY: 2x/week  PT DURATION: 8 weeks  PLANNED INTERVENTIONS: Therapeutic exercises, Therapeutic activity, Neuromuscular re-education, Balance training, Gait training, Patient/Family education, Self Care, Joint mobilization, Joint manipulation, Stair training, Aquatic Therapy, Dry Needling, Electrical stimulation, Spinal manipulation, Spinal mobilization, Cryotherapy, Moist heat, Taping, Ultrasound, Ionotophoresis 4mg /ml Dexamethasone, Manual therapy, and Re-evaluation  PLAN FOR NEXT SESSION: Aquatics: continue progressive aquatic exercises. Land: progress resistance training as tolerated.  Manual work/stretching for knee ROM. Work on sit<>stands  Joshuajames Moehring April Dell [place]onto, PT 04/21/23 9:42 AM

## 2023-04-24 ENCOUNTER — Encounter: Payer: Medicaid Other | Admitting: Rehabilitative and Restorative Service Providers"

## 2023-04-24 ENCOUNTER — Ambulatory Visit: Payer: Medicaid Other | Admitting: Physical Therapy

## 2023-04-24 ENCOUNTER — Other Ambulatory Visit: Payer: Self-pay

## 2023-04-24 NOTE — Therapy (Deleted)
OUTPATIENT PHYSICAL THERAPY TREATMENT NOTE   Patient Name: Haley [name] MRN: [id number] DOB:[birth year], 57 y.o., female Today's Date: 04/24/2023  END OF SESSION:      Past Medical History:  Diagnosis Date   Essential hypertension    Fibroids    Hyperthyroidism    a. 2010, treated w/ tapazole.   Morbid obesity (HCC)    Paroxysmal atrial fibrillation (HCC)    a. 03/2009 - in setting of hyperthyroidism and caffeine intake, converted spontaneously;  b. 08/2016 ED visit for recurrent PAF-->successful DCCV in ED.   Snores    Type II diabetes mellitus [facility])    Past Surgical History:  Procedure Laterality Date   UTERINE FIBROID SURGERY     Patient Active Problem List   Diagnosis Date Noted   Bilateral knee pain 02/21/2023   Osteoarthritis of knees, bilateral 02/21/2023   Chronic diastolic CHF (congestive heart failure) (HCC) 02/17/2022   Abnormal vaginal bleeding in postmenopausal patient 11/25/2020   Goiter with hyperthyroidism    UARS (upper airway resistance syndrome)    Hyperthyroidism    Acute diastolic (congestive) heart failure (HCC) 12/21/2019   Hypoxia 12/21/2019   Substernal thyroid goiter 02/19/2019   Current use of long term anticoagulation 02/19/2019   Colon cancer screening 02/19/2019   Pure hypercholesterolemia 02/19/2019   Breast cancer screening 02/19/2019   Class 3 severe obesity due to excess calories with serious comorbidity and body mass index (BMI) of 45.0 to 49.9 in adult (HCC) 01/27/2019   Hypomagnesemia 01/27/2019   Type II diabetes mellitus (HCC) 01/01/2019   Severe sleep apnea 01/14/2017   Hypoxemia 01/14/2017   Paroxysmal atrial fibrillation (HCC)    Essential hypertension     PCP: Claiborne Rigg, NP  REFERRING PROVIDER: Persons, [place], [place]  REFERRING DIAG: M25.561,M25.562,G89.29 (ICD-10-CM) - Chronic pain of both knees  THERAPY DIAG:  No diagnosis found.  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   PERTINENT HISTORY: HTN, DM, OA, obesity  PAIN:  Are you having pain? Yes Pain rating: 6/10  Location:  R knee Description: achy, tight  PRECAUTIONS: None  WEIGHT BEARING RESTRICTIONS: No  FALLS:  Has patient fallen in last 6 months? 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  NEXT MD VISIT: Endocrinologist on 04/06/2023  OBJECTIVE: (Measures in this section from initial evaluation unless otherwise noted)  DIAGNOSTIC FINDINGS:  Left knee radiograph on 02/20/2023: IMPRESSION: Severe medial and patellofemoral compartment osteoarthritis.  Right knee radiograph on 02/20/2023: IMPRESSION: 1. Severe medial and patellofemoral compartment osteoarthritis, worsened from remote prior. 2. Chronic 12 mm loose body within the suprapatellar joint space.  PATIENT SURVEYS:  03/15/2023: LEFS 24 / 80 = 30.0 %   SENSATION: Eval:  reports numbness and tingling in right thigh   MUSCLE LENGTH: Hamstrings: tightness bilaterally with right more impaired  POSTURE: rounded shoulders and forward head  PALPATION: Tender to palpation along right knee  LOWER EXTREMITY ROM:  03/15/2023: Right knee 3-70 degrees Left knee 0-93 degrees  04/21/23:  R knee flexion 88 deg L knee flexion 100 deg  LOWER EXTREMITY MMT:  03/15/2023: Right hip flexor 3+/5, Right quad 4-/5, Right hamstring 4-/5 Left hip  flexor is 3+/5, left quad is 4+/5, left hamstring 4-/5   FUNCTIONAL TESTS:   03/15/2023: 5 times sit to stand: 17.96 sec with UE use Timed up and go (TUG): 13.07 sec  04/05/2023: 6 minute walk test:  587 ft with 2 seated recovery periods  GAIT: Distance walked: >50 ft Assistive device utilized: None Level of assistance: Modified independence Comments: Pt reports that she can walk at least 10 minutes before she starts having knee pain   TODAY'S TREATMENT:   04/24/23: Therapeutic Exercise ***  04/21/23: Therapeutic Exercise Nustep L5 x 5 min UEs/LEs Seated hamstring stretch x30 sec Seated heel slide on slider 2x10 Seated knee flexion self stretch x30 sec Standing hip abd red TB 2x10 Standing hip ext red TB 2x10 Standing marching red TB 2x10 Standing heel raises 2x10 Standing hamstring curl red TB 2x10 Sit<>stand x10 Seated SLR x10 Manual Therapy Grade II to III joint mobilization for knee flexion  04/18/23: Pt seen for aquatic therapy today.  Treatment took place in water 3.5-4.75 ft in depth at the [facility] pool. Temp of water was 91.  Pt entered/exited the pool via stairs independent in step-to pattern with bilat rail. - unsupported walking forward/ backward with cues for vertical trunk and less compensatory hip strategies - side stepping with arm addct/ abdct - without resistance, -> rainbow -> yellow hand floats - high knee marching with reciprocal arm swing - holding wall: alternating single leg clams x 10; hip openers/ crosses x 10;  - TrA set with short noodle -> solid noodle x 15 - UE on solid noodle leg swings into hip abdct/ addct x 10; hip flex/ext x 10 - return to walking forward / backward - STS at bench in water with feet on blue step with cues for hip hinge and controlled descent to bench x 7   Pt requires the buoyancy and hydrostatic pressure of water for support, and to offload joints by unweighting joint load by at least 50 % in navel deep  water and by at least 75-80% in chest to neck deep water.  Viscosity of the water is needed for resistance of strengthening. Water current perturbations provides challenge to standing balance requiring increased core activation.   04/12/23:   Pt arrives for aquatic physical therapy. Treatment took place in 3.5-5.5 feet of water. Water temperature was 90 degrees F. Pt entered the pool reciprocally via steps with moderate use of rails. Pt requires buoyancy of water for support and to offload joints with strengthening exercises.  Seated water bench with 75% submersion Pt performed seated LE AROM exercises 20x in all planes, with concurrent discussion of status and how she felt after her first aquatic session. 75% depth water walking 6 lengths using UE weights for alternating arms in all 4 directions.Hip 3 ways 15x Bil required UE to side of pool for balance. 8 min bicycle on yellow noodle in horseback.  04/07/23: Pt arrives for aquatic physical therapy. Treatment took place in 3.5-5.5 feet of water. Water temperature was 90 degrees F. Pt entered the pool reciprocally via steps with moderate use of rails. Pt requires buoyancy of water for support and to offload joints with strengthening exercises.  Seated water bench with 75% submersion Pt performed seated LE AROM exercises 20x in all planes, with concurrent discussion of future plans for water exercise. 75% depth water walking 6 lengths using UE weights for alternating arms in all 4 directions.Hip 3 ways 10x Bil required UE to side of pool for balance. 8 min bicycle on yellow noodle in horseback.                                                                                                                          DATE: 04/05/2023 Nustep level 3 x6 min with PT present to discuss status Seated:  heel/toe raises, marching, LAQ.  2x10 each  bilat Seated heel slides with foot with foot on slider 2x10 bilat Seated hamstring stretch 2x20 sec bilat 6 minute walk test:  587 ft with patient requires 2 seated recovery periods secondary to pain and fatigue Seated hip abduction with red tband 2x10 Seated hamstring curl with red tband 2x10 bilat   PATIENT EDUCATION:  Education details: Issued HEP Person educated: Patient Education method: Explanation and Handouts Education comprehension: verbalized understanding and returned demonstration  HOME EXERCISE PROGRAM: Access Code: [email or web address] URL: [email or web address] Date: 03/15/2023 Prepared by: Clydie Braun Menke  Exercises - Seated Hamstring Stretch  - 1 x daily - 7 x weekly - 1 sets - 2 reps - 20 sec hold - Seated March  - 1 x daily - 7 x weekly - 2 sets - 10 reps - Seated Long Arc Quad  - 1 x daily - 7 x weekly - 2 sets - 10 reps - Seated Heel Slide  - 1 x daily - 7 x weekly - 2 sets - 10 reps - Seated Heel Toe Raises  - 1 x daily - 7 x weekly - 2 sets - 10 reps - Standing March with Counter Support  - 1 x daily - 7 x weekly - 2 sets - 10 reps - Standing Hip Abduction with Counter Support  - 1 x daily - 7 x weekly - 2 sets - 10 reps - Standing Hip Extension with Counter Support  - 1 x daily - 7 x weekly - 2 sets - 10 reps  ASSESSMENT:  CLINICAL IMPRESSION: *** Truncated session due to late arrival. Worked on progressing her exercises with resistance. Improving knee ROM noted today. Able to obtain almost 90 deg of flexion in R knee.   OBJECTIVE IMPAIRMENTS: decreased balance, difficulty walking, decreased ROM, decreased strength, impaired flexibility, improper body mechanics, postural dysfunction, and pain.    GOALS: Goals reviewed with patient? Yes  SHORT TERM GOALS: Target date: 04/07/2023 Patient will be independent with initial HEP. Baseline: Goal status: IN PROGRESS  2.  Patient will  be able to participate in a 6 minute walk test to establish a baseline of  activity tolerance. Baseline:  Goal status: MET on 04/05/2023   LONG TERM GOALS: Target date: 05/12/2023  Patient will be independent with advanced HEP. Baseline:  Goal status: INITIAL  2.  Patient will increase lower extremity functional scale to at least 50% to demonstrate improvements in functional mobility Baseline: 30% Goal status: INITIAL  3.  Patient will increase right knee A/ROM flexion to at least 95 degrees to allow patient to more easily navigate stairs. Baseline: 3-70 Goal status: IN PROGRESS  4.  Patient will increase bilateral lower extremity strength to at least 4 to 4+/5 to allow her to increase her functional mobility. Baseline:  Goal status: INITIAL  5.  Patient will improve 5 times sit to/from stand to less than 15 seconds to improve her functional mobility. Baseline: 17.96 sec Goal status: INITIAL     PLAN:  PT FREQUENCY: 2x/week  PT DURATION: 8 weeks  PLANNED INTERVENTIONS: Therapeutic exercises, Therapeutic activity, Neuromuscular re-education, Balance training, Gait training, Patient/Family education, Self Care, Joint mobilization, Joint manipulation, Stair training, Aquatic Therapy, Dry Needling, Electrical stimulation, Spinal manipulation, Spinal mobilization, Cryotherapy, Moist heat, Taping, Ultrasound, Ionotophoresis 4mg /ml Dexamethasone, Manual therapy, and Re-evaluation  PLAN FOR NEXT SESSION: Aquatics: continue progressive aquatic exercises. Land: progress resistance training as tolerated. Manual work/stretching for knee ROM. Work on sit<>stands  Dalyn Becker April Dell Ponto, PT 04/24/23 7:54 AM

## 2023-04-26 ENCOUNTER — Ambulatory Visit: Payer: Medicaid Other | Admitting: Physical Therapy

## 2023-04-27 ENCOUNTER — Ambulatory Visit: Payer: Medicaid Other

## 2023-04-27 DIAGNOSIS — R262 Difficulty in walking, not elsewhere classified: Secondary | ICD-10-CM

## 2023-04-27 DIAGNOSIS — M6281 Muscle weakness (generalized): Secondary | ICD-10-CM

## 2023-04-27 DIAGNOSIS — G8929 Other chronic pain: Secondary | ICD-10-CM

## 2023-04-27 DIAGNOSIS — R2689 Other abnormalities of gait and mobility: Secondary | ICD-10-CM

## 2023-04-27 NOTE — Therapy (Signed)
OUTPATIENT PHYSICAL THERAPY TREATMENT NOTE   Patient Name: Haley [name] MRN: [id number] DOB:[birth year], 57 y.o., female Today's Date: 04/27/2023  END OF SESSION:  PT End of Session - 04/27/23 1237     Visit Number 7    Date for PT Re-Evaluation 05/12/23    Authorization Type Medicaid [employer]    Authorization - Number of Visits 27    PT Start Time 1230    PT Stop Time 1312    PT Time Calculation (min) 42 min    Activity Tolerance Patient tolerated treatment well    Behavior During Therapy WFL for tasks assessed/performed               Past Medical History:  Diagnosis Date   Essential hypertension    Fibroids    Hyperthyroidism    a. 2010, treated w/ tapazole.   Morbid obesity (HCC)    Paroxysmal atrial fibrillation (HCC)    a. 03/2009 - in setting of hyperthyroidism and caffeine intake, converted spontaneously;  b. 08/2016 ED visit for recurrent PAF-->successful DCCV in ED.   Snores    Type II diabetes mellitus (HCC)    Past Surgical History:  Procedure Laterality Date   UTERINE FIBROID SURGERY     Patient Active Problem List   Diagnosis Date Noted   Bilateral knee pain 02/21/2023   Osteoarthritis of knees, bilateral 02/21/2023   Chronic diastolic CHF (congestive heart failure) (HCC) 02/17/2022   Abnormal vaginal bleeding in postmenopausal patient 11/25/2020   Goiter with hyperthyroidism    UARS (upper airway resistance syndrome)    Hyperthyroidism    Acute diastolic (congestive) heart failure (HCC) 12/21/2019   Hypoxia 12/21/2019   Substernal thyroid goiter 02/19/2019   Current use of long term anticoagulation 02/19/2019   Colon cancer screening 02/19/2019   Pure hypercholesterolemia 02/19/2019   Breast cancer screening 02/19/2019   Class 3 severe obesity due to excess calories with serious comorbidity and body mass index (BMI) of 45.0 to 49.9 in adult (HCC) 01/27/2019   Hypomagnesemia 01/27/2019   Type II diabetes mellitus (HCC) 01/01/2019    Severe sleep apnea 01/14/2017   Hypoxemia 01/14/2017   Paroxysmal atrial fibrillation (HCC)    Essential hypertension     PCP: Claiborne Rigg, NP  REFERRING PROVIDER: Persons, [place], [place]  REFERRING DIAG: M25.561,M25.562,G89.29 (ICD-10-CM) - Chronic pain of both knees  THERAPY DIAG:  Chronic pain of left knee  Chronic pain of right knee  Difficulty in walking, not elsewhere classified  Other abnormalities of gait and mobility  Muscle weakness (generalized)  Rationale for Evaluation and Treatment: Rehabilitation  ONSET DATE: approx 2 years ago  SUBJECTIVE:   SUBJECTIVE STATEMENT: Pt reports she feels some better.  My knees aren't as stiff.  Reports her pain at 4/10.  PERTINENT HISTORY: HTN, DM, OA, obesity  PAIN:  04/27/23 Are you having pain? Yes Pain rating: 4/10  Location:  R knee Description: achy, tight  PRECAUTIONS: None  WEIGHT BEARING RESTRICTIONS: No  FALLS:  Has patient fallen in last 6 months? 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  NEXT MD VISIT: Endocrinologist on 04/06/2023  OBJECTIVE: (Measures in  this section from initial evaluation unless otherwise noted)  DIAGNOSTIC FINDINGS:  Left knee radiograph on 02/20/2023: IMPRESSION: Severe medial and patellofemoral compartment osteoarthritis.  Right knee radiograph on 02/20/2023: IMPRESSION: 1. Severe medial and patellofemoral compartment osteoarthritis, worsened from remote prior. 2. Chronic 12 mm loose body within the suprapatellar joint space.  PATIENT SURVEYS:  03/15/2023: LEFS 24 / 80 =  30.0 %   SENSATION: Eval:  reports numbness and tingling in right thigh   MUSCLE LENGTH: Hamstrings: tightness bilaterally with right more impaired  POSTURE: rounded shoulders and forward head  PALPATION: Tender to palpation along right knee  LOWER EXTREMITY ROM:  03/15/2023: Right knee 3-70 degrees Left knee 0-93 degrees  04/21/23:  R knee flexion 88 deg L knee flexion 100 deg  LOWER EXTREMITY MMT:  03/15/2023: Right hip flexor 3+/5, Right quad 4-/5, Right hamstring 4-/5 Left hip flexor is 3+/5, left quad is 4+/5, left hamstring 4-/5   FUNCTIONAL TESTS:   03/15/2023: 5 times sit to stand: 17.96 sec with UE use Timed up and go (TUG): 13.07 sec  04/05/2023: 6 minute walk test:  587 ft with 2 seated recovery periods  GAIT: Distance walked: >50 ft Assistive device utilized: None Level of assistance: Modified independence Comments: Pt reports that she can walk at least 10 minutes before she starts having knee pain   TODAY'S TREATMENT:   04/27/23: Therapeutic Exercise Nustep L5 x 5 min UEs/LEs Seated hamstring stretch 2 x 30 sec each LE Seated heel slide on slider 2x10 each LE Seated knee flexion self stretch x30 sec each LE Seated isometric hip adduction using ball x 20 LAQ x 20 each LE with hip adduction using ball Supine/ hook lying combined hip IR/ER stretch x 10 each side holding 10 sec each Quad set x 20 LE SLR x 20 each LE  04/21/23: Therapeutic Exercise Nustep L5 x 5 min UEs/LEs Seated hamstring stretch x30 sec each LE Seated heel slide on slider 2x10 Seated knee flexion self stretch x30 sec Standing hip abd red TB 2x10 Standing hip ext red TB 2x10 Standing marching red TB 2x10 Standing heel raises 2x10 Standing hamstring curl red TB 2x10 Sit<>stand x10 Seated SLR x10 Manual Therapy Grade II to III joint mobilization for knee flexion  04/18/23: Pt seen for aquatic therapy today.  Treatment took place in water 3.5-4.75 ft in depth at the [facility] pool. Temp of water was 91.  Pt entered/exited the pool via stairs independent in step-to pattern with bilat rail. - unsupported walking forward/ backward with cues for vertical trunk and less compensatory hip strategies - side stepping with arm addct/ abdct - without resistance, -> rainbow -> yellow hand floats - high knee marching with reciprocal arm swing - holding wall: alternating single leg clams x 10; hip openers/ crosses x 10;  - TrA set with short noodle -> solid noodle x 15 - UE on solid noodle leg swings into hip abdct/ addct x 10; hip flex/ext x 10 - return to walking forward / backward - STS at bench in water with feet on blue step with cues for hip hinge and controlled descent to bench x 7   Pt requires the buoyancy and hydrostatic pressure of water for support, and to offload joints by unweighting joint load by at least 50 % in navel deep water and by at least 75-80% in chest to neck deep water.  Viscosity of the water is needed for resistance of strengthening. Water current perturbations provides challenge to standing  balance requiring increased core activation.   04/12/23:   Pt arrives for aquatic physical therapy. Treatment took place in 3.5-5.5 feet of water. Water temperature was 90 degrees F. Pt entered the pool reciprocally via steps with moderate use of rails. Pt requires buoyancy of water for support and to offload joints with strengthening exercises.  Seated water bench with 75% submersion Pt performed seated LE AROM exercises 20x in all planes, with concurrent discussion of status and how she felt after her first aquatic session. 75% depth water walking 6 lengths using UE weights for alternating arms in all 4 directions.Hip 3 ways 15x Bil required UE to side of pool for balance. 8 min bicycle on yellow noodle in horseback.                                                                                                                            PATIENT  EDUCATION:  Education details: Issued HEP Person educated: Patient Education method: Chief Technology Officer Education comprehension: verbalized understanding and returned demonstration  HOME EXERCISE PROGRAM: Access Code: [email or web address] URL: [email or web address] Date: 04/27/2023 Prepared by: Mikey Kirschner  Exercises - Seated Hamstring Stretch  - 1 x daily - 7 x weekly - 1 sets - 2 reps - 20 sec hold - Seated Long Arc Quad  - 1 x daily - 7 x weekly - 2 sets - 10 reps - Seated Heel Slide  - 1 x daily - 7 x weekly - 2 sets - 10 reps - Standing March with Counter Support  - 1 x daily - 7 x weekly - 2 sets - 10 reps - Standing Hip Abduction with Resistance at Ankles and Counter Support  - 1 x daily - 7 x weekly - 2 sets - 10 reps - Hip Extension with Resistance Loop  - 1 x daily - 7 x weekly - 2 sets - 10 reps - Heel Raises with Counter Support  - 1 x daily - 7 x weekly - 2 sets - 10 reps - Long Sitting Quad Set  - 1 x daily - 7 x weekly - 3 sets - 10 reps - Small Range Straight Leg Raise  - 1 x daily - 7 x weekly - 3 sets - 10 reps  ASSESSMENT:  CLINICAL IMPRESSION: Helvi is progressing appropriately and actually has decent underlying quad activity.  Her LE posture and alignment is extremely poor.  She would benefit from continued skilled PT for quad rehab and alignment training.    OBJECTIVE IMPAIRMENTS: decreased balance, difficulty walking, decreased ROM, decreased strength, impaired flexibility, improper body mechanics, postural dysfunction, and pain.    GOALS: Goals reviewed with patient? Yes  SHORT TERM GOALS: Target date: 04/07/2023 Patient will be independent with initial HEP. Baseline: Goal status: IN PROGRESS  2.  Patient will be able to participate in a 6 minute walk test to establish a baseline of activity tolerance. Baseline:  Goal status: MET on 04/05/2023   LONG TERM  GOALS: Target date: 05/12/2023  Patient will be independent with advanced  HEP. Baseline:  Goal status: INITIAL  2.  Patient will increase lower extremity functional scale to at least 50% to demonstrate improvements in functional mobility Baseline: 30% Goal status: INITIAL  3.  Patient will increase right knee A/ROM flexion to at least 95 degrees to allow patient to more easily navigate stairs. Baseline: 3-70 Goal status: IN PROGRESS  4.  Patient will increase bilateral lower extremity strength to at least 4 to 4+/5 to allow her to increase her functional mobility. Baseline:  Goal status: INITIAL  5.  Patient will improve 5 times sit to/from stand to less than 15 seconds to improve her functional mobility. Baseline: 17.96 sec Goal status: INITIAL     PLAN:  PT FREQUENCY: 2x/week  PT DURATION: 8 weeks  PLANNED INTERVENTIONS: Therapeutic exercises, Therapeutic activity, Neuromuscular re-education, Balance training, Gait training, Patient/Family education, Self Care, Joint mobilization, Joint manipulation, Stair training, Aquatic Therapy, Dry Needling, Electrical stimulation, Spinal manipulation, Spinal mobilization, Cryotherapy, Moist heat, Taping, Ultrasound, Ionotophoresis 4mg /ml Dexamethasone, Manual therapy, and Re-evaluation  PLAN FOR NEXT SESSION: Aquatics: continue progressive aquatic exercises. Land: progress resistance training as tolerated. Manual work/stretching for knee ROM. Work on [employer]. Mcdaniel Ohms, PT 04/27/23 1:23 PM  [facility] [address] [place], [place] [address] Phone # [phone] Fax [phone]

## 2023-04-28 ENCOUNTER — Other Ambulatory Visit: Payer: Self-pay

## 2023-04-28 ENCOUNTER — Ambulatory Visit (HOSPITAL_BASED_OUTPATIENT_CLINIC_OR_DEPARTMENT_OTHER): Payer: Medicaid Other | Admitting: Physical Therapy

## 2023-04-28 ENCOUNTER — Encounter (HOSPITAL_BASED_OUTPATIENT_CLINIC_OR_DEPARTMENT_OTHER): Payer: Self-pay | Admitting: Physical Therapy

## 2023-04-28 DIAGNOSIS — R2689 Other abnormalities of gait and mobility: Secondary | ICD-10-CM

## 2023-04-28 DIAGNOSIS — G8929 Other chronic pain: Secondary | ICD-10-CM

## 2023-04-28 DIAGNOSIS — M6281 Muscle weakness (generalized): Secondary | ICD-10-CM

## 2023-04-28 DIAGNOSIS — R262 Difficulty in walking, not elsewhere classified: Secondary | ICD-10-CM | POA: Diagnosis not present

## 2023-04-28 NOTE — Patient Instructions (Signed)

## 2023-04-28 NOTE — Therapy (Addendum)
OUTPATIENT PHYSICAL THERAPY TREATMENT NOTE   Patient Name: Haley [name] MRN: [id number] DOB:February 23, 1966, 57 y.o., female Today's Date: 04/28/2023  END OF SESSION:  PT End of Session - 04/28/23 1133     Visit Number 8    Date for PT Re-Evaluation 05/12/23    Authorization Type Medicaid [employer]    PT Start Time 1131   pt arrived late   PT Stop Time 1200    PT Time Calculation (min) 29 min    Behavior During Therapy [facility] for tasks assessed/performed               Past Medical History:  Diagnosis Date   Essential hypertension    Fibroids    Hyperthyroidism    a. 2010, treated w/ tapazole.   Morbid obesity (HCC)    Paroxysmal atrial fibrillation (HCC)    a. 03/2009 - in setting of hyperthyroidism and caffeine intake, converted spontaneously;  b. 08/2016 ED visit for recurrent PAF-->successful DCCV in ED.   Snores    Type II diabetes mellitus (HCC)    Past Surgical History:  Procedure Laterality Date   UTERINE FIBROID SURGERY     Patient Active Problem List   Diagnosis Date Noted   Bilateral knee pain 02/21/2023   Osteoarthritis of knees, bilateral 02/21/2023   Chronic diastolic CHF (congestive heart failure) (HCC) 02/17/2022   Abnormal vaginal bleeding in postmenopausal patient 11/25/2020   Goiter with hyperthyroidism    UARS (upper airway resistance syndrome)    Hyperthyroidism    Acute diastolic (congestive) heart failure (HCC) 12/21/2019   Hypoxia 12/21/2019   Substernal thyroid goiter 02/19/2019   Current use of long term anticoagulation 02/19/2019   Colon cancer screening 02/19/2019   Pure hypercholesterolemia 02/19/2019   Breast cancer screening 02/19/2019   Class 3 severe obesity due to excess calories with serious comorbidity and body mass index (BMI) of 45.0 to 49.9 in adult (HCC) 01/27/2019   Hypomagnesemia 01/27/2019   Type II diabetes mellitus (HCC) 01/01/2019   Severe sleep apnea 01/14/2017   Hypoxemia 01/14/2017   Paroxysmal atrial  fibrillation (HCC)    Essential hypertension     PCP: Claiborne Rigg, NP  REFERRING PROVIDER: Persons, [place], [place]  REFERRING DIAG: M25.561,M25.562,G89.29 (ICD-10-CM) - Chronic pain of both knees  THERAPY DIAG:  Chronic pain of left knee  Chronic pain of right knee  Difficulty in walking, not elsewhere classified  Other abnormalities of gait and mobility  Rationale for Evaluation and Treatment: Rehabilitation  ONSET DATE: approx 2 years ago  SUBJECTIVE:   SUBJECTIVE STATEMENT: Pt reports she did great after therapy session yesterday. "I had more motion in my legs and I didn't feel like I staggered when I was getting up (from sitting)"   PERTINENT HISTORY: HTN, DM, OA, obesity  PAIN:  04/27/23 Are you having pain? Yes Pain rating: 3/10  Location: bilat medial knees Description: achy, tight  PRECAUTIONS: None  WEIGHT BEARING RESTRICTIONS: No  FALLS:  Has patient fallen in last 6 months? 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  NEXT MD VISIT: Endocrinologist on 04/06/2023  OBJECTIVE: (Measures in this section from initial evaluation  unless otherwise noted)  DIAGNOSTIC FINDINGS:  Left knee radiograph on 02/20/2023: IMPRESSION: Severe medial and patellofemoral compartment osteoarthritis.  Right knee radiograph on 02/20/2023: IMPRESSION: 1. Severe medial and patellofemoral compartment osteoarthritis, worsened from remote prior. 2. Chronic 12 mm loose body within the suprapatellar joint space.  PATIENT SURVEYS:  03/15/2023: LEFS 24 / 80 = 30.0  %   SENSATION: Eval:  reports numbness and tingling in right thigh   MUSCLE LENGTH: Hamstrings: tightness bilaterally with right more impaired  POSTURE: rounded shoulders and forward head  PALPATION: Tender to palpation along right knee  LOWER EXTREMITY ROM:  03/15/2023: Right knee 3-70 degrees Left knee 0-93 degrees  04/21/23:  R knee flexion 88 deg L knee flexion 100 deg  LOWER EXTREMITY MMT:  03/15/2023: Right hip flexor 3+/5, Right quad 4-/5, Right hamstring 4-/5 Left hip flexor is 3+/5, left quad is 4+/5, left hamstring 4-/5   FUNCTIONAL TESTS:   03/15/2023: 5 times sit to stand: 17.96 sec with UE use Timed up and go (TUG): 13.07 sec  04/05/2023: 6 minute walk test:  587 ft with 2 seated recovery periods  GAIT: Distance walked: >50 ft Assistive device utilized: None Level of assistance: Modified independence Comments: Pt reports that she can walk at least 10 minutes before she starts having knee pain   TODAY'S TREATMENT:   04/28/23: Pt seen for aquatic therapy today.  Treatment took place in water 3.5-4.75 ft in depth at the [facility] pool. Temp of water was 91.  Pt entered/exited the pool via stairs independent in step-to pattern with bilat rail. - seated on bench in water:  cycling and alternating LAQ - unsupported walking forward/ backward with cues for vertical trunk and less compensatory hip strategies - side stepping with arm addct/ abdct - without resistance, -> yellow hand floats - high knee marching with yellow hand floats under water at side - holding wall: hip abdct/addct leading with heel x 12; Leg swings into hip flex/ext x 10 each; alternating single leg clams x 10 - TrA set with solid noodle x 10 - return to walking forward / backward with reciprocal arm swing - 3 way LE stretch with solid noodle supporting ankle, back against wall - quad stretch with ankle supported by solid noodle ,UE on wall After dried off:  applied reg rock tape  to bilat medial knee joint line to increase proprioception and decompress tissue  04/27/23: Therapeutic Exercise Nustep L5 x 5 min UEs/LEs Seated hamstring stretch 2 x 30 sec each LE Seated heel slide on slider 2x10 each LE Seated knee flexion self stretch x30 sec each LE Seated isometric hip adduction using ball x 20 LAQ x 20 each LE with hip adduction using ball Supine/ hook lying combined hip IR/ER stretch x 10 each side holding 10 sec each Quad set x 20 LE SLR x 20 each LE  04/21/23: Therapeutic Exercise Nustep L5 x 5 min UEs/LEs Seated hamstring stretch x30 sec each LE Seated heel slide on slider 2x10 Seated knee flexion self stretch x30 sec Standing hip abd red TB 2x10 Standing hip ext red TB 2x10 Standing marching red TB 2x10 Standing heel raises 2x10 Standing hamstring curl red TB 2x10 Sit<>stand x10 Seated SLR x10 Manual Therapy Grade II to III joint mobilization for knee flexion  04/18/23: Pt seen for aquatic therapy today.  Treatment took place in water 3.5-4.75 ft in depth at the [facility] pool. Temp of water was 91.  Pt entered/exited the pool via stairs independent  in step-to pattern with bilat rail. - unsupported walking forward/ backward with cues for vertical trunk and less compensatory hip strategies - side stepping with arm addct/ abdct - without resistance, -> rainbow -> yellow hand floats - high knee marching with reciprocal arm swing - holding wall: alternating single leg clams x 10; hip openers/ crosses x 10;  - TrA set with short noodle -> solid noodle x 15 - UE on solid noodle leg swings into hip abdct/ addct x 10; hip flex/ext x 10 - return to walking forward / backward - STS at bench in water with feet on blue step with cues for hip hinge and controlled descent to bench x 7   Pt requires the buoyancy and hydrostatic pressure of water for support, and to offload joints by unweighting joint load by at least 50 % in navel deep water and by at  least 75-80% in chest to neck deep water.  Viscosity of the water is needed for resistance of strengthening. Water current perturbations provides challenge to standing balance requiring increased core activation.   04/12/23:   Pt arrives for aquatic physical therapy. Treatment took place in 3.5-5.5 feet of water. Water temperature was 90 degrees F. Pt entered the pool reciprocally via steps with moderate use of rails. Pt requires buoyancy of water for support and to offload joints with strengthening exercises.  Seated water bench with 75% submersion Pt performed seated LE AROM exercises 20x in all planes, with concurrent discussion of status and how she felt after her first aquatic session. 75% depth water walking 6 lengths using UE weights for alternating arms in all 4 directions.Hip 3 ways 15x Bil required UE to side of pool for balance. 8 min bicycle on yellow noodle in horseback.                                                                                                                            PATIENT EDUCATION:  Education details: ktape rationale, safe removal technique  Person educated: Patient Education method: Explanation  Education comprehension: verbalized understanding and returned demonstration  HOME EXERCISE PROGRAM: Access Code: [email or web address] URL: [email or web address] Date: 04/27/2023 Prepared by: Mikey Kirschner  Exercises - Seated Hamstring Stretch  - 1 x daily - 7 x weekly - 1 sets - 2 reps - 20 sec hold - Seated Long Arc Quad  - 1 x daily - 7 x weekly - 2 sets - 10 reps - Seated Heel Slide  - 1 x daily - 7 x weekly - 2 sets - 10 reps - Standing March with Counter Support  - 1 x daily - 7 x weekly - 2 sets - 10 reps - Standing Hip Abduction with Resistance at Ankles and Counter Support  - 1 x daily - 7 x weekly - 2 sets - 10 reps - Hip Extension with Resistance Loop  - 1 x daily - 7 x weekly - 2 sets - 10 reps - Heel Raises with  Counter Support  - 1 x daily  - 7 x weekly - 2 sets - 10 reps - Long Sitting Quad Set  - 1 x daily - 7 x weekly - 3 sets - 10 reps - Small Range Straight Leg Raise  - 1 x daily - 7 x weekly - 3 sets - 10 reps AQUATIC Access Code: [email or web address] URL: [email or web address] Date: 04/28/23 -  * not issued yet * Prepared by: [facility] [address]  ASSESSMENT:  CLINICAL IMPRESSION: Pt tolerated aquatic exercises well reporting overall reduction of knee pain.  She is able to complete LE stretches supported by noodle with good form and relief.  Trial of rock tape applied to bilat knees for decompression of tissue and to increase proprioception.  She would benefit from continued skilled PT for quad rehab and alignment training.    OBJECTIVE IMPAIRMENTS: decreased balance, difficulty walking, decreased ROM, decreased strength, impaired flexibility, improper body mechanics, postural dysfunction, and pain.    GOALS: Goals reviewed with patient? Yes  SHORT TERM GOALS: Target date: 04/07/2023 Patient will be independent with initial HEP. Baseline: Goal status: IN PROGRESS  2.  Patient will be able to participate in a 6 minute walk test to establish a baseline of activity tolerance. Baseline:  Goal status: MET on 04/05/2023   LONG TERM GOALS: Target date: 05/12/2023  Patient will be independent with advanced HEP. Baseline:  Goal status: INITIAL  2.  Patient will increase lower extremity functional scale to at least 50% to demonstrate improvements in functional mobility Baseline: 30% Goal status: INITIAL  3.  Patient will increase right knee A/ROM flexion to at least 95 degrees to allow patient to more easily navigate stairs. Baseline: 3-70 Goal status: IN PROGRESS  4.  Patient will increase bilateral lower extremity strength to at least 4 to 4+/5 to allow her to increase her functional mobility. Baseline:  Goal status: INITIAL  5.  Patient will improve 5 times sit to/from stand to less than  15 seconds to improve her functional mobility. Baseline: 17.96 sec Goal status: INITIAL     PLAN:  PT FREQUENCY: 2x/week  PT DURATION: 8 weeks  PLANNED INTERVENTIONS: Therapeutic exercises, Therapeutic activity, Neuromuscular re-education, Balance training, Gait training, Patient/Family education, Self Care, Joint mobilization, Joint manipulation, Stair training, Aquatic Therapy, Dry Needling, Electrical stimulation, Spinal manipulation, Spinal mobilization, Cryotherapy, Moist heat, Taping, Ultrasound, Ionotophoresis 4mg /ml Dexamethasone, Manual therapy, and Re-evaluation  PLAN FOR NEXT SESSION: Aquatics: continue progressive aquatic exercises. Land: progress resistance training as tolerated. Manual work/stretching for knee ROM. Work on sit<>stands  Mayer Camel, PTA 04/28/23 12:03 PM [facility] [address] [place], [place], [address] Phone: [phone]   Fax:  [phone]

## 2023-05-02 ENCOUNTER — Ambulatory Visit: Payer: Medicaid Other

## 2023-05-02 ENCOUNTER — Telehealth: Payer: Self-pay | Admitting: Cardiovascular Disease

## 2023-05-02 ENCOUNTER — Ambulatory Visit: Payer: Medicaid Other | Admitting: Rehabilitative and Restorative Service Providers"

## 2023-05-02 DIAGNOSIS — G8929 Other chronic pain: Secondary | ICD-10-CM

## 2023-05-02 DIAGNOSIS — R262 Difficulty in walking, not elsewhere classified: Secondary | ICD-10-CM | POA: Diagnosis not present

## 2023-05-02 DIAGNOSIS — I10 Essential (primary) hypertension: Secondary | ICD-10-CM

## 2023-05-02 DIAGNOSIS — I5032 Chronic diastolic (congestive) heart failure: Secondary | ICD-10-CM

## 2023-05-02 DIAGNOSIS — R2689 Other abnormalities of gait and mobility: Secondary | ICD-10-CM

## 2023-05-02 DIAGNOSIS — M6281 Muscle weakness (generalized): Secondary | ICD-10-CM

## 2023-05-02 DIAGNOSIS — G4733 Obstructive sleep apnea (adult) (pediatric): Secondary | ICD-10-CM

## 2023-05-02 NOTE — Telephone Encounter (Signed)
Patient wants to know what company she needs to call parts for her CPAP.

## 2023-05-02 NOTE — Therapy (Signed)
OUTPATIENT PHYSICAL THERAPY TREATMENT NOTE   Patient Name: Haley [name] MRN: [id number] DOB:Feb 26, 1966, 57 y.o., female Today's Date: 05/02/2023  END OF SESSION:  PT End of Session - 05/02/23 1246     Visit Number 9    Date for PT Re-Evaluation 05/12/23    Authorization Type Medicaid [employer]    Authorization - Number of Visits 27    PT Start Time 1243    PT Stop Time 1315    PT Time Calculation (min) 32 min    Activity Tolerance Patient tolerated treatment well    Behavior During Therapy WFL for tasks assessed/performed               Past Medical History:  Diagnosis Date   Essential hypertension    Fibroids    Hyperthyroidism    a. 2010, treated w/ tapazole.   Morbid obesity (HCC)    Paroxysmal atrial fibrillation (HCC)    a. 03/2009 - in setting of hyperthyroidism and caffeine intake, converted spontaneously;  b. 08/2016 ED visit for recurrent PAF-->successful DCCV in ED.   Snores    Type II diabetes mellitus (HCC)    Past Surgical History:  Procedure Laterality Date   UTERINE FIBROID SURGERY     Patient Active Problem List   Diagnosis Date Noted   Bilateral knee pain 02/21/2023   Osteoarthritis of knees, bilateral 02/21/2023   Chronic diastolic CHF (congestive heart failure) (HCC) 02/17/2022   Abnormal vaginal bleeding in postmenopausal patient 11/25/2020   Goiter with hyperthyroidism    UARS (upper airway resistance syndrome)    Hyperthyroidism    Acute diastolic (congestive) heart failure (HCC) 12/21/2019   Hypoxia 12/21/2019   Substernal thyroid goiter 02/19/2019   Current use of long term anticoagulation 02/19/2019   Colon cancer screening 02/19/2019   Pure hypercholesterolemia 02/19/2019   Breast cancer screening 02/19/2019   Class 3 severe obesity due to excess calories with serious comorbidity and body mass index (BMI) of 45.0 to 49.9 in adult (HCC) 01/27/2019   Hypomagnesemia 01/27/2019   Type II diabetes mellitus (HCC) 01/01/2019    Severe sleep apnea 01/14/2017   Hypoxemia 01/14/2017   Paroxysmal atrial fibrillation (HCC)    Essential hypertension     PCP: Claiborne Rigg, NP  REFERRING PROVIDER: Persons, [place], [place]  REFERRING DIAG: M25.561,M25.562,G89.29 (ICD-10-CM) - Chronic pain of both knees  THERAPY DIAG:  Chronic pain of left knee  Chronic pain of right knee  Difficulty in walking, not elsewhere classified  Other abnormalities of gait and mobility  Muscle weakness (generalized)  Rationale for Evaluation and Treatment: Rehabilitation  ONSET DATE: approx 2 years ago  SUBJECTIVE:   SUBJECTIVE STATEMENT: Pt reports she is doing ok.  Feeling like she can get up from sitting and start walking without intense pain.    PERTINENT HISTORY: HTN, DM, OA, obesity  PAIN:  04/27/23 Are you having pain? Yes Pain rating: 3/10  Location: bilat medial knees Description: achy, tight  PRECAUTIONS: None  WEIGHT BEARING RESTRICTIONS: No  FALLS:  Has patient fallen in last 6 months? 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  NEXT MD VISIT: Endocrinologist on  04/06/2023  OBJECTIVE: (Measures in this section from initial evaluation unless otherwise noted)  DIAGNOSTIC FINDINGS:  Left knee radiograph on 02/20/2023: IMPRESSION: Severe medial and patellofemoral compartment osteoarthritis.  Right knee radiograph on 02/20/2023: IMPRESSION: 1. Severe medial and patellofemoral compartment osteoarthritis, worsened from remote prior. 2. Chronic 12 mm loose body within the suprapatellar joint space.  PATIENT  SURVEYS:  03/15/2023: LEFS 24 / 80 = 30.0 %   SENSATION: Eval:  reports numbness and tingling in right thigh   MUSCLE LENGTH: Hamstrings: tightness bilaterally with right more impaired  POSTURE: rounded shoulders and forward head  PALPATION: Tender to palpation along right knee  LOWER EXTREMITY ROM:  03/15/2023: Right knee 3-70 degrees Left knee 0-93 degrees  04/21/23:  R knee flexion 88 deg L knee flexion 100 deg  LOWER EXTREMITY MMT:  03/15/2023: Right hip flexor 3+/5, Right quad 4-/5, Right hamstring 4-/5 Left hip flexor is 3+/5, left quad is 4+/5, left hamstring 4-/5   FUNCTIONAL TESTS:   03/15/2023: 5 times sit to stand: 17.96 sec with UE use Timed up and go (TUG): 13.07 sec  04/05/2023: 6 minute walk test:  587 ft with 2 seated recovery periods  GAIT: Distance walked: >50 ft Assistive device utilized: None Level of assistance: Modified independence Comments: Pt reports that she can walk at least 10 minutes before she starts having knee pain   TODAY'S TREATMENT:   05/02/23: (12 min late) Therapeutic Exercise Nustep L5 x 5 min UEs/Les Medial patellar glide kinesio taping technique bilaterally Seated quad set x 20  Supine quad set x 20 Supine TKE x 20 with 4 lbs Supine SAQ x 20 with 4 lbs Seated LAQ x 20 with   04/28/23: Pt seen for aquatic therapy today.  Treatment took place in water 3.5-4.75 ft in depth at the [facility] pool. Temp of water was 91.  Pt entered/exited the pool via stairs independent in step-to pattern with bilat rail. - seated on bench in water:  cycling and alternating LAQ - unsupported walking forward/ backward with cues for vertical trunk and less compensatory hip strategies - side stepping with arm addct/ abdct - without resistance, -> yellow hand floats - high knee marching with yellow hand floats under water at side - holding wall: hip abdct/addct leading with heel x 12; Leg swings into hip flex/ext x 10 each; alternating  single leg clams x 10 - TrA set with solid noodle x 10 - return to walking forward / backward with reciprocal arm swing - 3 way LE stretch with solid noodle supporting ankle, back against wall - quad stretch with ankle supported by solid noodle ,UE on wall After dried off:  applied reg rock tape to bilat medial knee joint line to increase proprioception and decompress tissue  04/27/23: Therapeutic Exercise Nustep L5 x 5 min UEs/LEs Seated hamstring stretch 2 x 30 sec each LE Seated heel slide on slider 2x10 each LE Seated knee flexion self stretch x30 sec each LE Seated isometric hip adduction using ball x 20 LAQ x 20 each LE with hip adduction using ball Supine/ hook lying combined hip IR/ER stretch x 10 each side holding 10 sec each Quad set x 20 LE SLR x 20 each LE   PATIENT EDUCATION:  Education details: ktape rationale, safe removal technique  Person educated: Patient Education method: Explanation  Education comprehension: verbalized understanding and returned demonstration  HOME EXERCISE PROGRAM: Access Code: [email or web address] URL: [email or web address] Date: 05/02/2023 Prepared by: Mikey Kirschner  Exercises - Seated Hamstring Stretch  -  1 x daily - 7 x weekly - 1 sets - 2 reps - 20 sec hold - Seated Long Arc Quad  - 1 x daily - 7 x weekly - 2 sets - 10 reps - Seated Heel Slide  - 1 x daily - 7 x weekly - 2 sets - 10 reps - Standing March with Counter Support  - 1 x daily - 7 x weekly - 2 sets - 10 reps - Standing Hip Abduction with Resistance at Ankles and Counter Support  - 1 x daily - 7 x weekly - 2 sets - 10 reps - Hip Extension with Resistance Loop  - 1 x daily - 7 x weekly - 2 sets - 10 reps - Heel Raises with Counter Support  - 1 x daily - 7 x weekly - 2 sets - 10 reps - Long Sitting Quad Set  - 1 x daily - 7 x weekly - 3 sets - 10 reps - Small Range Straight Leg Raise  - 1 x daily - 7 x weekly - 3 sets - 10 reps - Seated Long Arc Quad  - 1 x daily - 7 x  weekly - 3 sets - 10 reps - Sit to Stand Without Arm Support  - 1 x daily - 7 x weekly - 2 sets - 10 reps  ASSESSMENT:  CLINICAL IMPRESSION: Patient was late for her original appt, rescheduled for 12:30 and was 12 min late for that appt.  She is responding well to quad rehab and taping technique.  She was able to do 4 lbs resistance on several quad exercises.  She had very little crepitus on open chain quad exercises both audibly and palpably.  However, on sit to stand, she had fairly pronounced crepitus but she reported very little pain.  She would likely have improved results from her therapy if we had the full session to work with her.  She would benefit from continued skilled PT for quad rehab and alignment training.    OBJECTIVE IMPAIRMENTS: decreased balance, difficulty walking, decreased ROM, decreased strength, impaired flexibility, improper body mechanics, postural dysfunction, and pain.    GOALS: Goals reviewed with patient? Yes  SHORT TERM GOALS: Target date: 04/07/2023 Patient will be independent with initial HEP. Baseline: Goal status: IN PROGRESS  2.  Patient will be able to participate in a 6 minute walk test to establish a baseline of activity tolerance. Baseline:  Goal status: MET on 04/05/2023   LONG TERM GOALS: Target date: 05/12/2023  Patient will be independent with advanced HEP. Baseline:  Goal status: INITIAL  2.  Patient will increase lower extremity functional scale to at least 50% to demonstrate improvements in functional mobility Baseline: 30% Goal status: INITIAL  3.  Patient will increase right knee A/ROM flexion to at least 95 degrees to allow patient to more easily navigate stairs. Baseline: 3-70 Goal status: IN PROGRESS  4.  Patient will increase bilateral lower extremity strength to at least 4 to 4+/5 to allow her to increase her functional mobility. Baseline:  Goal status: INITIAL  5.  Patient will improve 5 times sit to/from stand to less than 15  seconds to improve her functional mobility. Baseline: 17.96 sec Goal status: INITIAL     PLAN:  PT FREQUENCY: 2x/week  PT DURATION: 8 weeks  PLANNED INTERVENTIONS: Therapeutic exercises, Therapeutic activity, Neuromuscular re-education, Balance training, Gait training, Patient/Family education, Self Care, Joint mobilization, Joint manipulation, Stair training, Aquatic Therapy, Dry Needling, Electrical stimulation, Spinal manipulation, Spinal mobilization,  Cryotherapy, Moist heat, Taping, Ultrasound, Ionotophoresis 4mg /ml Dexamethasone, Manual therapy, and Re-evaluation  PLAN FOR NEXT SESSION: Aquatics: continue progressive aquatic exercises. Land: progress resistance training as tolerated. Manual work/stretching for knee ROM. Work on [employer] B. Shella Lahman, PT 05/02/23 1:36 PM [facility] [address], [address] [place], [place] [address] Phone # [phone] Fax [phone]

## 2023-05-03 ENCOUNTER — Ambulatory Visit: Payer: Medicaid Other | Admitting: Physical Therapy

## 2023-05-05 ENCOUNTER — Encounter: Payer: Medicaid Other | Admitting: Rehabilitative and Restorative Service Providers"

## 2023-05-05 ENCOUNTER — Other Ambulatory Visit: Payer: Self-pay

## 2023-05-08 ENCOUNTER — Encounter (HOSPITAL_BASED_OUTPATIENT_CLINIC_OR_DEPARTMENT_OTHER): Payer: Self-pay | Admitting: Physical Therapy

## 2023-05-08 ENCOUNTER — Ambulatory Visit (HOSPITAL_BASED_OUTPATIENT_CLINIC_OR_DEPARTMENT_OTHER): Payer: Medicaid Other | Admitting: Physical Therapy

## 2023-05-08 ENCOUNTER — Other Ambulatory Visit: Payer: Self-pay

## 2023-05-08 ENCOUNTER — Telehealth: Payer: Self-pay

## 2023-05-08 ENCOUNTER — Other Ambulatory Visit: Payer: Self-pay | Admitting: Cardiovascular Disease

## 2023-05-08 ENCOUNTER — Encounter (HOSPITAL_BASED_OUTPATIENT_CLINIC_OR_DEPARTMENT_OTHER): Payer: Self-pay

## 2023-05-08 ENCOUNTER — Encounter: Payer: Medicaid Other | Admitting: Rehabilitative and Restorative Service Providers"

## 2023-05-08 ENCOUNTER — Other Ambulatory Visit (HOSPITAL_COMMUNITY): Payer: Self-pay

## 2023-05-08 ENCOUNTER — Telehealth (HOSPITAL_BASED_OUTPATIENT_CLINIC_OR_DEPARTMENT_OTHER): Payer: Self-pay | Admitting: Physical Therapy

## 2023-05-08 ENCOUNTER — Ambulatory Visit (HOSPITAL_BASED_OUTPATIENT_CLINIC_OR_DEPARTMENT_OTHER): Payer: Medicaid Other | Attending: Physician Assistant | Admitting: Physical Therapy

## 2023-05-08 DIAGNOSIS — G8929 Other chronic pain: Secondary | ICD-10-CM | POA: Insufficient documentation

## 2023-05-08 DIAGNOSIS — I480 Paroxysmal atrial fibrillation: Secondary | ICD-10-CM

## 2023-05-08 DIAGNOSIS — M25561 Pain in right knee: Secondary | ICD-10-CM | POA: Insufficient documentation

## 2023-05-08 DIAGNOSIS — R262 Difficulty in walking, not elsewhere classified: Secondary | ICD-10-CM | POA: Diagnosis present

## 2023-05-08 DIAGNOSIS — R2689 Other abnormalities of gait and mobility: Secondary | ICD-10-CM | POA: Diagnosis present

## 2023-05-08 DIAGNOSIS — M25562 Pain in left knee: Secondary | ICD-10-CM | POA: Diagnosis present

## 2023-05-08 NOTE — Telephone Encounter (Signed)
Patient attempted to be outreached by Maddyx Wieck on 05/08/23 to discuss hypertension. Left voicemail for patient to return our call at their convenience at [phone].  Luby Seamans, Student-PharmD 

## 2023-05-08 NOTE — Telephone Encounter (Signed)
Patient did not show for her 9:45am aquatic therapy appt.  Called and spoke to patient.  She stated that she overslept.  Able to move appointment to 11:15am today.     Mayer Camel, PTA 05/08/23 10:24 AM [facility] [address] [place], [place], [address] Phone: [phone]   Fax:  [phone]

## 2023-05-08 NOTE — Therapy (Addendum)
OUTPATIENT PHYSICAL THERAPY TREATMENT NOTE AND LATE ENTRY DISCHARGE SUMMARY   Patient Name: Haley [name] MRN: [id number] DOB:[birth year], 57 y.o., female Today's Date: 05/08/2023  END OF SESSION:  PT End of Session - 05/08/23 1122     Visit Number 10    Date for PT Re-Evaluation 05/12/23    Authorization Type Medicaid [employer]    Authorization - Number of Visits 27    PT Start Time 1119    PT Stop Time 1200    PT Time Calculation (min) 41 min    Activity Tolerance Patient tolerated treatment well    Behavior During Therapy WFL for tasks assessed/performed               Past Medical History:  Diagnosis Date   Essential hypertension    Fibroids    Hyperthyroidism    a. 2010, treated w/ tapazole.   Morbid obesity (HCC)    Paroxysmal atrial fibrillation (HCC)    a. 03/2009 - in setting of hyperthyroidism and caffeine intake, converted spontaneously;  b. 08/2016 ED visit for recurrent PAF-->successful DCCV in ED.   Snores    Type II diabetes mellitus (HCC)    Past Surgical History:  Procedure Laterality Date   UTERINE FIBROID SURGERY     Patient Active Problem List   Diagnosis Date Noted   Bilateral knee pain 02/21/2023   Osteoarthritis of knees, bilateral 02/21/2023   Chronic diastolic CHF (congestive heart failure) (HCC) 02/17/2022   Abnormal vaginal bleeding in postmenopausal patient 11/25/2020   Goiter with hyperthyroidism    UARS (upper airway resistance syndrome)    Hyperthyroidism    Acute diastolic (congestive) heart failure (HCC) 12/21/2019   Hypoxia 12/21/2019   Substernal thyroid goiter 02/19/2019   Current use of long term anticoagulation 02/19/2019   Colon cancer screening 02/19/2019   Pure hypercholesterolemia 02/19/2019   Breast cancer screening 02/19/2019   Class 3 severe obesity due to excess calories with serious comorbidity and body mass index (BMI) of 45.0 to 49.9 in adult (HCC) 01/27/2019   Hypomagnesemia 01/27/2019   Type II  diabetes mellitus (HCC) 01/01/2019   Severe sleep apnea 01/14/2017   Hypoxemia 01/14/2017   Paroxysmal atrial fibrillation (HCC)    Essential hypertension     PCP: Claiborne Rigg, NP  REFERRING PROVIDER: Persons, [place], [place]  REFERRING DIAG: M25.561,M25.562,G89.29 (ICD-10-CM) - Chronic pain of both knees  THERAPY DIAG:  Chronic pain of left knee  Chronic pain of right knee  Difficulty in walking, not elsewhere classified  Other abnormalities of gait and mobility  Rationale for Evaluation and Treatment: Rehabilitation  ONSET DATE: approx 2 years ago  SUBJECTIVE:   SUBJECTIVE STATEMENT: Pt reports she is doing well  PERTINENT HISTORY: HTN, DM, OA, obesity  PAIN:  04/27/23 Are you having pain? Yes Pain rating: 3/10  Location: bilat medial knees Description: achy, tight  PRECAUTIONS: None  WEIGHT BEARING RESTRICTIONS: No  FALLS:  Has patient fallen in last 6 months? 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  NEXT MD VISIT: Endocrinologist on 04/06/2023  OBJECTIVE: (Measures in this section from initial evaluation unless otherwise noted)  DIAGNOSTIC FINDINGS:  Left knee radiograph on 02/20/2023: IMPRESSION: Severe medial and patellofemoral compartment osteoarthritis.  Right knee radiograph on 02/20/2023: IMPRESSION: 1. Severe medial and patellofemoral compartment osteoarthritis, worsened from remote prior. 2. Chronic 12 mm loose body within the suprapatellar joint space.  PATIENT SURVEYS:  03/15/2023: LEFS 24 / 80 = 30.0 %   SENSATION: Eval:  reports  numbness and tingling in right thigh   MUSCLE LENGTH: Hamstrings: tightness bilaterally with right more impaired  POSTURE: rounded shoulders and forward head  PALPATION: Tender to palpation along right knee  LOWER EXTREMITY ROM:  03/15/2023: Right knee 3-70 degrees Left knee 0-93 degrees  04/21/23:  R knee flexion 88 deg L knee flexion 100 deg  LOWER EXTREMITY MMT:  03/15/2023: Right hip flexor 3+/5, Right quad 4-/5, Right hamstring 4-/5 Left hip flexor is 3+/5, left quad is 4+/5, left hamstring 4-/5   FUNCTIONAL TESTS:   03/15/2023: 5 times sit to stand: 17.96 sec with UE use Timed up and go (TUG): 13.07 sec  04/05/2023: 6 minute walk test:  587 ft with 2 seated recovery periods  GAIT: Distance walked: >50 ft Assistive device utilized: None Level of assistance: Modified independence Comments: Pt reports that she can walk at least 10 minutes before she starts having knee pain   TODAY'S TREATMENT:   05/08/23 Pt seen for aquatic therapy today.  Treatment took place in water 3.5-4.75 ft in depth at the [facility] pool. Temp of water was 91.  Pt entered/exited the pool via stairs independent in step-to pattern with bilat rail. - seated on bench in water:  cycling and alternating LAQ - unsupported walking forward/ backward - side stepping with arm addct/ abdct yellow hand floats - high knee marching with yellow hand floats under water at side - TrA set with solid noodle->yellow noodle->yellow HB in wide stance then staggered - 3 way LE stretch with solid noodle supporting ankle, back against wall - quad stretch with ankle supported by solid noodle ,UE on wall - holding wall: hip abdct/addct leading with heel x 12; Leg swings into hip flex/ext x 10 each; alternating single leg clams x 10; squats -STS from 3rd step x 10 - return to walking forward / backward with reciprocal arm swing   05/02/23: (12 min late) Therapeutic Exercise Nustep L5 x 5 min  UEs/Les Medial patellar glide kinesio taping technique bilaterally Seated quad set x 20  Supine quad set x 20 Supine TKE x 20 with 4 lbs Supine SAQ x 20 with 4 lbs Seated LAQ x 20 with   04/28/23: Pt seen for aquatic therapy today.  Treatment took place in water 3.5-4.75 ft in depth at the [facility] pool. Temp of water was 91.  Pt entered/exited the pool via stairs independent in step-to pattern with bilat rail. - seated on bench in water:  cycling and alternating LAQ - unsupported walking forward/ backward - side stepping with arm addct/ abdct yellow hand floats - high knee marching with yellow hand floats under water at side - TrA set with solid noodle->yellow noodle->yellow HB in wide stance then staggered - holding wall: hip abdct/addct leading with heel x 12; Leg swings into hip flex/ext x 10 each; alternating single leg clams x 10 - return to walking forward / backward with reciprocal arm swing - 3 way LE stretch with solid noodle supporting ankle, back against wall - quad stretch with ankle supported by solid noodle ,UE on wall   04/27/23: Therapeutic Exercise Nustep L5 x 5 min UEs/LEs Seated  hamstring stretch 2 x 30 sec each LE Seated heel slide on slider 2x10 each LE Seated knee flexion self stretch x30 sec each LE Seated isometric hip adduction using ball x 20 LAQ x 20 each LE with hip adduction using ball Supine/ hook lying combined hip IR/ER stretch x 10 each side holding 10 sec each Quad set x 20 LE SLR x 20 each LE   PATIENT EDUCATION:  Education details: ktape rationale, safe removal technique  Person educated: Patient Education method: Explanation  Education comprehension: verbalized understanding and returned demonstration  HOME EXERCISE PROGRAM: Access Code: [email or web address] URL: [email or web address] Date: 05/02/2023 Prepared by: Mikey Kirschner  Exercises - Seated Hamstring Stretch  - 1 x daily - 7 x weekly - 1 sets - 2 reps - 20 sec  hold - Seated Long Arc Quad  - 1 x daily - 7 x weekly - 2 sets - 10 reps - Seated Heel Slide  - 1 x daily - 7 x weekly - 2 sets - 10 reps - Standing March with Counter Support  - 1 x daily - 7 x weekly - 2 sets - 10 reps - Standing Hip Abduction with Resistance at Ankles and Counter Support  - 1 x daily - 7 x weekly - 2 sets - 10 reps - Hip Extension with Resistance Loop  - 1 x daily - 7 x weekly - 2 sets - 10 reps - Heel Raises with Counter Support  - 1 x daily - 7 x weekly - 2 sets - 10 reps - Long Sitting Quad Set  - 1 x daily - 7 x weekly - 3 sets - 10 reps - Small Range Straight Leg Raise  - 1 x daily - 7 x weekly - 3 sets - 10 reps - Seated Long Arc Quad  - 1 x daily - 7 x weekly - 3 sets - 10 reps - Sit to Stand Without Arm Support  - 1 x daily - 7 x weekly - 2 sets - 10 reps   AQUATIC Access Code: [email or web address] URL: [email or web address] Date: 04/28/23   Prepared by: [facility] [address] This aquatic home exercise program from MedBridge utilizes pictures from land based exercises, but has been adapted prior to lamination and issuance.   ASSESSMENT:  CLINICAL IMPRESSION:  Pt instructed on final aquatic HEP.  She completes well with vc and demonstration. Clarifications on program given. She is given handout on available pools in area. Hoping to gain access to [facility] pool.  Goals ongoing    OBJECTIVE IMPAIRMENTS: decreased balance, difficulty walking, decreased ROM, decreased strength, impaired flexibility, improper body mechanics, postural dysfunction, and pain.    GOALS: Goals reviewed with patient? Yes  SHORT TERM GOALS: Target date: 04/07/2023 Patient will be independent with initial HEP. Baseline: Goal status: IN PROGRESS  2.  Patient will be able to participate in a 6 minute walk test to establish a baseline of activity tolerance. Baseline:  Goal status: MET on 04/05/2023   LONG TERM GOALS: Target date: 05/12/2023  Patient will be  independent with advanced HEP. Baseline:  Goal status: INITIAL  2.  Patient will increase lower extremity functional scale to at least 50% to demonstrate improvements in functional mobility Baseline: 30% Goal status: INITIAL  3.  Patient will increase right knee A/ROM flexion to at least 95 degrees to allow patient to more easily navigate stairs. Baseline: 3-70 Goal status: IN PROGRESS  4.  Patient will increase bilateral lower extremity strength  to at least 4 to 4+/5 to allow her to increase her functional mobility. Baseline:  Goal status: INITIAL  5.  Patient will improve 5 times sit to/from stand to less than 15 seconds to improve her functional mobility. Baseline: 17.96 sec Goal status: INITIAL     PLAN:  PT FREQUENCY: 2x/week  PT DURATION: 8 weeks  PLANNED INTERVENTIONS: Therapeutic exercises, Therapeutic activity, Neuromuscular re-education, Balance training, Gait training, Patient/Family education, Self Care, Joint mobilization, Joint manipulation, Stair training, Aquatic Therapy, Dry Needling, Electrical stimulation, Spinal manipulation, Spinal mobilization, Cryotherapy, Moist heat, Taping, Ultrasound, Ionotophoresis 4mg /ml Dexamethasone, Manual therapy, and Re-evaluation  PLAN FOR NEXT SESSION: Aquatics: continue progressive aquatic exercises. Land: progress resistance training as tolerated. Manual work/stretching for knee ROM. Work on sit<>stands  Corrie Dandy (Frankie) Arloa Prak MPT 05/08/23 11:26 AM    PHYSICAL THERAPY DISCHARGE SUMMARY  Patient called to cancel her appointment on 05/11/2023 secondary to work conflict.  Offered alternative spots on 05/11/2023 and patient declined stating work.  Patient asked to be placed on wait list for an opening on 05/12/2023.  She was placed on wait list and notified that if she cannot make an appointment by 05/12/2023, she would be discharged secondary to end of certification and attendance/compliance.  Patient verbalized understanding.  Patient  was able to secure a wait list appointment for 05/12/2023.  She cancelled her appointment with less than 24 hour notice.  Patient discharged at this time, she has been provided previously with HEP.  Patient agrees to discharge. Patient goals were not met. Patient is being discharged due to attendance policy and not returning since the last visit.  Shaunda Menke, PT 05/12/23 8:25 AM

## 2023-05-09 ENCOUNTER — Ambulatory Visit: Payer: Medicaid Other | Attending: Nurse Practitioner | Admitting: Nurse Practitioner

## 2023-05-09 ENCOUNTER — Other Ambulatory Visit: Payer: Self-pay

## 2023-05-09 ENCOUNTER — Encounter: Payer: Self-pay | Admitting: Nurse Practitioner

## 2023-05-09 VITALS — BP 119/69 | HR 86 | Ht 67.0 in | Wt 258.0 lb

## 2023-05-09 DIAGNOSIS — E1169 Type 2 diabetes mellitus with other specified complication: Secondary | ICD-10-CM | POA: Diagnosis not present

## 2023-05-09 DIAGNOSIS — F321 Major depressive disorder, single episode, moderate: Secondary | ICD-10-CM

## 2023-05-09 DIAGNOSIS — F4321 Adjustment disorder with depressed mood: Secondary | ICD-10-CM | POA: Diagnosis not present

## 2023-05-09 DIAGNOSIS — I5032 Chronic diastolic (congestive) heart failure: Secondary | ICD-10-CM | POA: Diagnosis not present

## 2023-05-09 DIAGNOSIS — F5105 Insomnia due to other mental disorder: Secondary | ICD-10-CM

## 2023-05-09 DIAGNOSIS — Z7985 Long-term (current) use of injectable non-insulin antidiabetic drugs: Secondary | ICD-10-CM

## 2023-05-09 MED ORDER — FUROSEMIDE 20 MG PO TABS
20.0000 mg | ORAL_TABLET | Freq: Every day | ORAL | 0 refills | Status: DC
  Filled 2023-05-09 – 2023-07-25 (×3): qty 90, 90d supply, fill #0

## 2023-05-09 MED ORDER — TRAZODONE HCL 150 MG PO TABS
75.0000 mg | ORAL_TABLET | Freq: Every day | ORAL | 1 refills | Status: DC
  Filled 2023-05-09 – 2023-07-25 (×3): qty 90, 90d supply, fill #0
  Filled 2023-10-31: qty 90, 90d supply, fill #1

## 2023-05-09 MED ORDER — GABAPENTIN 100 MG PO CAPS
100.0000 mg | ORAL_CAPSULE | Freq: Every day | ORAL | 2 refills | Status: DC
  Filled 2023-05-09 – 2023-07-25 (×3): qty 90, 90d supply, fill #0
  Filled 2023-10-19 (×2): qty 90, 90d supply, fill #1
  Filled 2024-01-18: qty 90, 90d supply, fill #2

## 2023-05-09 MED ORDER — TRULICITY 1.5 MG/0.5ML ~~LOC~~ SOAJ
1.5000 mg | SUBCUTANEOUS | 3 refills | Status: DC
  Filled 2023-05-09 – 2023-06-12 (×2): qty 2, 28d supply, fill #0
  Filled 2023-07-25: qty 2, 28d supply, fill #1
  Filled 2023-09-06: qty 2, 28d supply, fill #2
  Filled 2023-10-19 (×2): qty 2, 28d supply, fill #3

## 2023-05-09 MED ORDER — LISINOPRIL 2.5 MG PO TABS
2.5000 mg | ORAL_TABLET | Freq: Every day | ORAL | 1 refills | Status: DC
  Filled 2023-05-09 – 2023-06-12 (×2): qty 90, 90d supply, fill #0
  Filled 2023-10-19 (×2): qty 90, 90d supply, fill #1

## 2023-05-09 NOTE — Progress Notes (Signed)
Taking more than prescribed. Furosemide

## 2023-05-09 NOTE — Progress Notes (Signed)
Assessment & Plan:  Haley [name] was seen today for medication refill, diabetes and congestive heart failure.  Diagnoses and all orders for this visit:  Type 2 diabetes mellitus with other specified complication, without long-term current use of insulin (HCC) -     Ambulatory referral to Ophthalmology -     Dulaglutide (TRULICITY) 1.5 MG/0.5ML SOPN; Inject 1.5 mg into the skin once a week. -     gabapentin (NEURONTIN) 100 MG capsule; Take 1 capsule (100 mg total) by mouth at bedtime. -     Hemoglobin A1c -     CMP14+EGFR -     lisinopril (ZESTRIL) 2.5 MG tablet; Take 1 tablet (2.5 mg total) by mouth daily.  Insomnia secondary to situational depression -     traZODone (DESYREL) 150 MG tablet; Take 0.5-1 tablets (75-150 mg total) by mouth at bedtime. -     Ambulatory referral to Behavioral Health  Chronic diastolic CHF (congestive heart failure) (HCC) -     furosemide (LASIX) 20 MG tablet; Take 1 tablet (20 mg total) by mouth daily. 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 These are reviewed and up-to-date. Referrals have been placed accordingly. Immunizations are up-to-date or declined.    Subjective:   Chief Complaint  Patient presents with   Medication Refill   Diabetes   Congestive Heart Failure   HPI Haley [name] 57 y.o. female presents to office today for medication refills. She has a history of CHF and is overdue for follow up with Cardiology.   She has a past medical history of Essential hypertension, Fibroids, Hyperthyroidism, Morbid obesity, Paroxysmal atrial fibrillation (Followed by Cardiology), Type 2 DM, morbid obesity, Taking metoprolol for PAH and tachycardic palpitations.     States she has been feeling a whirlwind of emotions over the past several  months. Mother and spouse passed away last year (summer 2023). She has started drinking heavily again. Would like to speak to a counselor in regard to her grief and depression.     DM 2 A1c for today is pending. She is currently prescribed Trulicity 1.5 mg weekly, glipizide 10 mg BID, metformin XR 2000 mg Daily. Takes renal dose ACE. Weight is down almost 20lbs since December.  Lab Results  Component Value Date   HGBA1C 6.6 (A) 12/19/2022  LDL at goal. Recommended that she continue atorvastatin 20 mg daily as prescribed.   Lab Results  Component Value Date   LDLCALC 47 12/16/2022   BP Readings from Last 3 Encounters:  05/09/23 119/69  02/06/23 (!) 147/78  12/19/22 126/73    CHF Needs to see Cardiology. Has been taking furosemide sometimes when her right knee swells or when her left foot and ankle swell. I have advised her not to increase her furosemide for joint swelling.    Review of Systems  Constitutional:  Negative for fever, malaise/fatigue and weight loss.  HENT: Negative.  Negative for nosebleeds.   Eyes: Negative.  Negative for blurred vision, double vision and photophobia.  Respiratory: Negative.  Negative for cough and shortness of breath.   Cardiovascular: Negative.  Negative for chest pain, palpitations and leg swelling.  Gastrointestinal: Negative.  Negative for heartburn, nausea and vomiting.  Musculoskeletal:  Positive for joint pain. Negative for myalgias.  Neurological: Negative.  Negative for dizziness,  focal weakness, seizures and headaches.  Psychiatric/Behavioral:  Positive for depression. Negative for suicidal ideas. The patient has insomnia.     Past Medical History:  Diagnosis Date   Essential hypertension    Fibroids    Hyperthyroidism    a. 2010, treated w/ tapazole.   Morbid obesity (HCC)    Paroxysmal atrial fibrillation (HCC)    a. 03/2009 - in setting of hyperthyroidism and caffeine intake, converted spontaneously;  b. 08/2016 ED visit for  recurrent PAF-->successful DCCV in ED.   Snores    Type II diabetes mellitus (HCC)     Past Surgical History:  Procedure Laterality Date   UTERINE FIBROID SURGERY      Family History  Problem Relation Age of Onset   Hypertension Mother    Diabetes Mellitus II Sister    Cancer Maternal Grandmother    Breast cancer Neg Hx     Social History Reviewed with no changes to be made today.   Outpatient Medications Prior to Visit  Medication Sig Dispense Refill   apixaban (ELIQUIS) 5 MG TABS tablet Take 1 tablet (5 mg total) by mouth 2 (two) times daily. 60 tablet 0   atorvastatin (LIPITOR) 20 MG tablet Take 1 tablet (20 mg total) by mouth daily. 90 tablet 1   Blood Glucose Monitoring Suppl (TRUE METRIX METER) w/Device KIT Use twice daily 1 kit 0   glipiZIDE (GLUCOTROL) 10 MG tablet Take 1 tablet (10 mg total) by mouth 2 (two) times daily before a meal. 180 tablet 1   glucose blood (TRUE METRIX BLOOD GLUCOSE TEST) test strip Use as instructed. Check blood glucose level by fingerstick twice per day. 200 each 12   metFORMIN (GLUCOPHAGE-XR) 500 MG 24 hr tablet Take 4 tablets (2,000 mg total) by mouth at bedtime. 360 tablet 1   methimazole (TAPAZOLE) 5 MG tablet Take 1 tablet (5 mg total) by mouth daily. Please make an in-person visit for more refills. 90 tablet 0   methocarbamol (ROBAXIN) 500 MG tablet Take 1 tablet (500 mg total) by mouth every 6 (six) hours as needed for muscle spasms. 60 tablet 3   Methylsulfonylmethane (MSM PO) Take 2 tablets by mouth daily.     metoprolol succinate (TOPROL-XL) 50 MG 24 hr tablet Take 1 tablet (50 mg total) by mouth daily. 90 tablet 3   Multiple Vitamins-Minerals (ALIVE WOMENS 50+) TABS Take 1 tablet by mouth daily. 90 tablet 3   TRUEplus Lancets 28G MISC Use twice daily for blood glucose check 200 each 6   Dulaglutide (TRULICITY) 1.5 MG/0.5ML SOPN INJECT 1.5 MG INTO THE SKIN ONCE A WEEK. 2 mL 1   Dulaglutide (TRULICITY) 1.5 MG/0.5ML SOPN Inject 1.5 mg into  the skin once a week. 6 mL 0   furosemide (LASIX) 20 MG tablet Take 1 tablet (20 mg total) by mouth daily. 90 tablet 0   gabapentin (NEURONTIN) 100 MG capsule Take 1 capsule (100 mg total) by mouth at bedtime. 90 capsule 2   lisinopril (ZESTRIL) 2.5 MG tablet Take 1 tablet (2.5 mg total) by mouth daily. 30 tablet 1   traZODone (DESYREL) 150 MG tablet Take 0.5-1 tablets (75-150 mg total) by mouth at bedtime. 90 tablet 1   No facility-administered medications prior to visit.    No Known Allergies     Objective:    BP 119/69 (BP Location: Left Arm, Patient Position: Sitting, Cuff Size: Normal)   Pulse 86   Ht 5\' 7"  (1.702 m)   Wt 258 lb (117  kg)   LMP  (LMP Unknown)   SpO2 91%   BMI 40.41 kg/m  Wt Readings from Last 3 Encounters:  05/09/23 258 lb (117 kg)  12/19/22 269 lb (122 kg)  11/17/22 277 lb 6.4 oz (125.8 kg)    Physical Exam Vitals and nursing note reviewed.  Constitutional:      Appearance: She is well-developed.  HENT:     Head: Normocephalic and atraumatic.  Cardiovascular:     Rate and Rhythm: Normal rate and regular rhythm.     Heart sounds: Normal heart sounds. No murmur heard.    No friction rub. No gallop.  Pulmonary:     Effort: Pulmonary effort is normal. No tachypnea or respiratory distress.     Breath sounds: Normal breath sounds. No decreased breath sounds, wheezing, rhonchi or rales.  Chest:     Chest wall: No tenderness.  Abdominal:     General: Bowel sounds are normal.     Palpations: Abdomen is soft.  Musculoskeletal:        General: Normal range of motion.     Cervical back: Normal range of motion.  Skin:    General: Skin is warm and dry.  Neurological:     Mental Status: She is alert and oriented to person, place, and time.     Coordination: Coordination normal.  Psychiatric:        Behavior: Behavior normal. Behavior is cooperative.        Thought Content: Thought content normal.        Judgment: Judgment normal.          Patient  has been counseled extensively about nutrition and exercise as well as the importance of adherence with medications and regular follow-up. The patient was given clear instructions to go to ER or return to medical center if symptoms don't improve, worsen or new problems develop. The patient verbalized understanding.   Follow-up: Return in about 3 months (around 08/09/2023) for PAP SMEAR.   Claiborne Rigg, FNP-BC [facility] [place], [place] [phone]   05/09/2023, 6:28 PM

## 2023-05-10 ENCOUNTER — Other Ambulatory Visit: Payer: Self-pay

## 2023-05-10 ENCOUNTER — Ambulatory Visit: Payer: Medicaid Other | Admitting: Physical Therapy

## 2023-05-10 ENCOUNTER — Encounter: Payer: Self-pay | Admitting: Rehabilitative and Restorative Service Providers"

## 2023-05-10 LAB — CMP14+EGFR
ALT: 15 IU/L (ref 0–32)
AST: 17 IU/L (ref 0–40)
Albumin/Globulin Ratio: 1.8 (ref 1.2–2.2)
Albumin: 4.6 g/dL (ref 3.8–4.9)
Alkaline Phosphatase: 64 IU/L (ref 44–121)
BUN/Creatinine Ratio: 20 (ref 9–23)
BUN: 10 mg/dL (ref 6–24)
Bilirubin Total: 0.5 mg/dL (ref 0.0–1.2)
CO2: 25 mmol/L (ref 20–29)
Calcium: 9.8 mg/dL (ref 8.7–10.2)
Chloride: 105 mmol/L (ref 96–106)
Creatinine, Ser: 0.51 mg/dL — ABNORMAL LOW (ref 0.57–1.00)
Globulin, Total: 2.6 g/dL (ref 1.5–4.5)
Glucose: 79 mg/dL (ref 70–99)
Potassium: 4.1 mmol/L (ref 3.5–5.2)
Sodium: 144 mmol/L (ref 134–144)
Total Protein: 7.2 g/dL (ref 6.0–8.5)
eGFR: 109 mL/min/{1.73_m2} (ref >59)

## 2023-05-10 LAB — HEMOGLOBIN A1C
Est. average glucose Bld gHb Est-mCnc: 131 mg/dL
Hgb A1c MFr Bld: 6.2 % — ABNORMAL HIGH (ref 4.8–5.6)

## 2023-05-11 ENCOUNTER — Ambulatory Visit: Payer: Medicaid Other | Admitting: Rehabilitative and Restorative Service Providers"

## 2023-05-12 ENCOUNTER — Ambulatory Visit: Payer: Medicaid Other | Admitting: Rehabilitative and Restorative Service Providers"

## 2023-05-15 ENCOUNTER — Other Ambulatory Visit: Payer: Self-pay

## 2023-05-16 ENCOUNTER — Other Ambulatory Visit: Payer: Self-pay

## 2023-05-18 NOTE — Telephone Encounter (Addendum)
Patient called back to say she has [employer]. I will send over a Rx for her supplies.

## 2023-05-22 ENCOUNTER — Other Ambulatory Visit: Payer: Self-pay

## 2023-05-23 ENCOUNTER — Ambulatory Visit: Payer: Medicaid Other | Admitting: Internal Medicine

## 2023-05-23 ENCOUNTER — Ambulatory Visit (INDEPENDENT_AMBULATORY_CARE_PROVIDER_SITE_OTHER): Payer: Medicaid Other | Admitting: Internal Medicine

## 2023-05-23 ENCOUNTER — Other Ambulatory Visit: Payer: Self-pay

## 2023-05-23 ENCOUNTER — Encounter: Payer: Self-pay | Admitting: Internal Medicine

## 2023-05-23 VITALS — BP 118/70 | HR 81 | Ht 67.0 in | Wt 256.4 lb

## 2023-05-23 DIAGNOSIS — E040 Nontoxic diffuse goiter: Secondary | ICD-10-CM

## 2023-05-23 DIAGNOSIS — E0590 Thyrotoxicosis, unspecified without thyrotoxic crisis or storm: Secondary | ICD-10-CM | POA: Diagnosis not present

## 2023-05-23 DIAGNOSIS — E0500 Thyrotoxicosis with diffuse goiter without thyrotoxic crisis or storm: Secondary | ICD-10-CM

## 2023-05-23 LAB — T4, FREE: Free T4: 0.73 ng/dL (ref 0.60–1.60)

## 2023-05-23 LAB — T3, FREE: T3, Free: 3.3 pg/mL (ref 2.3–4.2)

## 2023-05-23 LAB — TSH: TSH: 1.73 u[IU]/mL (ref 0.35–5.50)

## 2023-05-23 NOTE — Patient Instructions (Signed)
Please stop at the lab.  Please continue Methimazole 5 mg daily.  You should have an endocrinology follow-up appointment in 6 months, but likely sooner for labs.

## 2023-05-23 NOTE — Progress Notes (Unsigned)
Patient ID: Haley [name], female   DOB: [birth year], 57 y.o.   MRN: [id number]   HPI  Haley [name] is a 57 y.o.-year-old female, initially referred by her PCP, Haley Rigg, NP, returning for follow-up for thyrotoxicosis, diagnosed as Graves' disease at last visit and also large goiter.  Last visit 1 year ago.  Interim history: She mentions that shortness of breath improved.  She prev. Had oxygen desaturation.  She continues on BiPAP at night.  To due to metastatic lung cancer to the brain.  Her mother also lives She continues to have knee pain (osteoarthritis close and difficulty walking. Earlier in the year, she had palpitations, heat intolerance, unintentional weight loss. These improved, except for heat intolerance.  Reviewed and addended history: Patient has a history of thyrotoxicosis since 2010. At that time she was hospitalized with pericarditis and Acute heart failure.   After diagnosis, she was started on a medication (? MMI) >> she could not tolerate 2/2 extreme fatigue >> changed to another medication (? PTU).  Tests normalized afterwards and she was taken off the medication.    However, she again started to have symptoms: Weakness, shortness of breath.  The TSH was rechecked and this was low while her free T4 was high.   She was started on methimazole and Toprol.  Due to insomnia,  we increased her metoprolol to the previous dose, but gave her the higher dose at night.  She is treated with: - Methimazole 5 mg 3 times a day  >> 5 mg twice a day >> 5 mg once a day >> 2.5 mg daily (03/2021) >> stopped 09/2021 - Toprol-XL 50 mg daily + 25 mg at night >> 50 mg daily >> 25 mg in a.m. and 50 mg at night >> 25 mg daily  She saw Dr. Gerrit [name] in 05/2021 but declined thyroidectomy at that time.  In 09/2021, I ordered another thyroid ultrasound but she did not have this yet.  In 12/2022, at her visit with PCP, her TSH was undetectable and she was started back on methimazole  5 mg daily.  I was not aware of the results.  I reviewed her TFTs: Lab Results  Component Value Date   TSH <0.005 (L) 12/19/2022   TSH 1.01 03/31/2022   TSH 1.530 02/15/2022   TSH 1.370 05/27/2021   TSH 6.08 (H) 03/05/2021   TSH 12.500 (H) 11/24/2020   Lab Results  Component Value Date   FREET4 0.91 03/31/2022   FREET4 0.61 03/05/2021   FREET4 1.63 08/28/2020   FREET4 0.75 04/23/2020   FREET4 0.45 (L) 01/24/2020   FREET4 1.33 (H) 12/22/2019   No components found for: "FREET3"   Her TSI antibodies were elevated: Lab Results  Component Value Date   TSI 658 (H) 01/24/2020    She also has a large goiter with substernal extension.  Thyroid ultrasound (05/08/2019): Enlarged thyroid, no nodules. Inferior margin of the left thyroid lobe is difficult to visualize due to the substernal extension.  CXR (12/21/2019): Per review of the images, trachea is slightly deviated to the right  CT chest (12/22/2019): Stable, large goiter, extending into the substernal space  At the time of her diagnosis, she complained of: - weight gain, however, she had weight loss per review of the chart, approximately 7 pounds net in the last month - Heat intolerance-chronic - Poor sleep - Shortness of breath - Leg swelling - Hair loss  She continues to have shortness of breath, possibly also related  to deconditioning  but her sleep and hair loss improved.   She has hot flashes and palpitations, which are are chronic.  She sees cardiology (Haley [name]).   Pt denies: - feeling nodules in neck - hoarseness - dysphagia - choking  Pt does have a FH of thyroid ds.: M aunt and M cousin. No FH of thyroid cancer. No h/o radiation tx to head or neck. No herbal supplements. No Biotin use. No recent steroids use.   Patient also has CHF, and also severe OSA-started on CPAP in 12/2019 -felt much better afterwards. She had to stop as her hose broke >> changed to on BiPAP.  She was previously exercising  consistently, but then only occasionally: Stationary bike, walking, chair aerobics, weightlifting.   She has DM2, managed by PCP >> HbA1c levels reviewed: Lab Results  Component Value Date   HGBA1C 6.2 (H) 05/09/2023   HGBA1C 6.6 (A) 12/19/2022   HGBA1C 6.5 (H) 05/23/2022   ROS: + see HPI  I reviewed pt's medications, allergies, PMH, social hx, family hx, and changes were documented in the history of present illness. Otherwise, unchanged from my initial visit note.  Past Medical History:  Diagnosis Date   Essential hypertension    Fibroids    Hyperthyroidism    a. 2010, treated w/ tapazole.   Morbid obesity (HCC)    Paroxysmal atrial fibrillation (HCC)    a. 03/2009 - in setting of hyperthyroidism and caffeine intake, converted spontaneously;  b. 08/2016 ED visit for recurrent PAF-->successful DCCV in ED.   Snores    Type II diabetes mellitus (HCC)    Past Surgical History:  Procedure Laterality Date   UTERINE FIBROID SURGERY     Social History   Socioeconomic History   Marital status: Married    Spouse name: Not on file   Number of children: 0   Years of education: Not on file   Highest education level: Not on file  Occupational History   Occupation:  Environmental health practitioner, Scientist, research (medical)  Tobacco Use   Smoking status: Former Smoker, quit in 2018    Packs/day: 1.00    Years: 32.00    Pack years: 32.00    Quit date: 2012    Years since quitting: 9.1   Smokeless tobacco: Never Used   Tobacco comment: patient vapes  Substance and Sexual Activity   Alcohol use: No    Comment: Wine, 2-3 glasses, every 3 weeks   Drug use: No  Social History Narrative   Lives in [place] with husband.  English as a second language teacher.  Also in school @ [employer] for PT technician.     Social Determinants of Health   Financial Resource Strain:    Difficulty of Paying Living Expenses: Not on file  Food Insecurity:    Worried About Programme researcher, broadcasting/film/video in the Last Year: Not on file   [employer] of Food in the Last  Year: Not on file  Transportation Needs:    Lack of Transportation (Medical): Not on file   Lack of Transportation (Non-Medical): Not on file  Physical Activity:    Days of Exercise per Week: Not on file   Minutes of Exercise per Session: Not on file  Stress:    Feeling of Stress : Not on file  Social Connections:    Frequency of Communication with [name] and Family: Not on file   Frequency of Social Gatherings with [name] and Family: Not on file   Attends Religious Services: Not on file   Active  Member of Clubs or Organizations: Not on file   Attends Banker Meetings: Not on file   Marital Status: Not on file  Intimate Partner Violence:    Fear of Current or Ex-Partner: Not on file   Emotionally Abused: Not on file   Physically Abused: Not on file   Sexually Abused: Not on file   Current Outpatient Medications on File Prior to Visit  Medication Sig Dispense Refill   apixaban (ELIQUIS) 5 MG TABS tablet Take 1 tablet (5 mg total) by mouth 2 (two) times daily. 60 tablet 0   atorvastatin (LIPITOR) 20 MG tablet Take 1 tablet (20 mg total) by mouth daily. 90 tablet 1   Blood Glucose Monitoring Suppl (TRUE METRIX METER) w/Device KIT Use twice daily 1 kit 0   Dulaglutide (TRULICITY) 1.5 MG/0.5ML SOPN Inject 1.5 mg into the skin once a week. 2 mL 3   furosemide (LASIX) 20 MG tablet Take 1 tablet (20 mg total) by mouth daily. 90 tablet 0   gabapentin (NEURONTIN) 100 MG capsule Take 1 capsule (100 mg total) by mouth at bedtime. 90 capsule 2   glipiZIDE (GLUCOTROL) 10 MG tablet Take 1 tablet (10 mg total) by mouth 2 (two) times daily before a meal. 180 tablet 1   glucose blood (TRUE METRIX BLOOD GLUCOSE TEST) test strip Use as instructed. Check blood glucose level by fingerstick twice per day. 200 each 12   lisinopril (ZESTRIL) 2.5 MG tablet Take 1 tablet (2.5 mg total) by mouth daily. 90 tablet 1   metFORMIN (GLUCOPHAGE-XR) 500 MG 24 hr tablet Take 4 tablets (2,000 mg total) by  mouth at bedtime. 360 tablet 1   methimazole (TAPAZOLE) 5 MG tablet Take 1 tablet (5 mg total) by mouth daily. Please make an in-person visit for more refills. 90 tablet 0   methocarbamol (ROBAXIN) 500 MG tablet Take 1 tablet (500 mg total) by mouth every 6 (six) hours as needed for muscle spasms. 60 tablet 3   Methylsulfonylmethane (MSM PO) Take 2 tablets by mouth daily.     metoprolol succinate (TOPROL-XL) 50 MG 24 hr tablet Take 1 tablet (50 mg total) by mouth daily. 90 tablet 3   Multiple Vitamins-Minerals (ALIVE WOMENS 50+) TABS Take 1 tablet by mouth daily. 90 tablet 3   traZODone (DESYREL) 150 MG tablet Take 0.5-1 tablets (75-150 mg total) by mouth at bedtime. 90 tablet 1   TRUEplus Lancets 28G MISC Use twice daily for blood glucose check 200 each 6   No current facility-administered medications on file prior to visit.   No Known Allergies Family History  Problem Relation Age of Onset   Hypertension Mother    Diabetes Mellitus II Sister    Cancer Maternal Grandmother    Breast cancer Neg Hx    PE: BP 118/70   Pulse 81   Ht 5\' 7"  (1.702 m)   Wt 256 lb 6.4 oz (116.3 kg)   LMP  (LMP Unknown)   SpO2 91%   BMI 40.16 kg/m  Wt Readings from Last 3 Encounters:  05/23/23 256 lb 6.4 oz (116.3 kg)  05/09/23 258 lb (117 kg)  12/19/22 269 lb (122 kg)   Constitutional: overweight, in NAD Eyes: EOMI, no exophthalmos, no lid lag, no stare ENT: no thyromegaly felt on palpation, no cervical lymphadenopathy Cardiovascular: RRR, No MRG, no LE edema Respiratory: CTA B Musculoskeletal: no deformities Skin: no rashes Neurological: no tremor with outstretched hands  ASSESSMENT: 1. Thyrotoxicosis  2.  Goiter  PLAN:  1. Patient with history of thyrotoxicosis with initial thyrotoxic symptoms including weight loss, heat intolerance, shortness of breath, weakness.  TSI's were elevated pointing towards a diagnosis of Graves' disease.  She was started on methimazole and we were then able to  reduce the dose gradually, and came off completely in 09/2021.  At last visit, she was asymptomatic, and her TFTs were normal.  We continued off methimazole and continued Toprol-XL 25 mg daily, which she was taking at night and mentioned that this was helping her sleep. -Since last visit, however, she had another TSH that was suppressed, undetectable, in 03/2023.  PCP restarted methimazole at 5 mg daily.  She continues on this dose. -At today's visit, she feels well, without complaints other than heat intolerance, but she also had palpitations and significant weight loss ( while on Trulicity) since last OV - 40 lbs. Also, she had major stress with her husband passing away since last visit. -No signs of active Graves' ophthalmopathy: No blurry vision, double vision, eye pain, chemosis -She was previously on biotin but not anymore -We will recheck her TFTs today and change the methimazole dose accordingly.  I will also add TSI's. -Will continue Toprol-XL for now  2.  Goiter - large, extending in the mediastinum.  On the chest x-ray from 12/2019, the trachea was slightly deviated to the right. -She does not have significant neck compression symptoms but she did mention that saliva was catching in her throat occasionally and also had some cough.  Symptoms improved after starting CPAP and then switching to BiPAP. -She did have shortness of breath and fatigue and oxygen desaturation with walking.  However, it was difficult to know whether her symptoms were related to thyroid compression on the trachea or sleep apnea/deconditioning.  I did feel that her breathing would improve after thyroidectomy and this would also correct her thyrotoxicosis and referred her to Dr. Gerrit [name] for thyroidectomy.  She saw him in 05/2021, however, at that time, she did not want to proceed with thyroidectomy. -Last year I advised her to get another thyroid ultrasound to see if the thyroid gland was larger but she did not have this do  at last visit, since she did not have any new symptoms, I did not suggest repeat imaging.  I did advise her to let me know if she had any neck compression symptoms but at today's visit, she mentions that her symptoms were stable. -Will continue to follow her expectantly for now  Needs refills.  Carlus Pavlov, MD PhD [facility]

## 2023-05-24 ENCOUNTER — Other Ambulatory Visit: Payer: Self-pay

## 2023-05-24 MED ORDER — METHIMAZOLE 5 MG PO TABS
5.0000 mg | ORAL_TABLET | Freq: Every day | ORAL | 3 refills | Status: DC
  Filled 2023-05-24 – 2023-07-25 (×3): qty 90, 90d supply, fill #0
  Filled 2023-10-19 (×2): qty 90, 90d supply, fill #1
  Filled 2024-01-18: qty 90, 90d supply, fill #2
  Filled 2024-04-19: qty 90, 90d supply, fill #3

## 2023-05-26 LAB — THYROID STIMULATING IMMUNOGLOBULIN: TSI: 375 % baseline — ABNORMAL HIGH (ref <140)

## 2023-06-01 ENCOUNTER — Telehealth: Payer: Self-pay

## 2023-06-01 NOTE — Telephone Encounter (Signed)
Call to pt reference date of PREP starting for evening class changing to 06/27/23.  Sts will need to cancel starting. Has begun working 8a-8p and will not be able to attend classes. Encouraged to call me if her schedule changes.

## 2023-06-12 ENCOUNTER — Other Ambulatory Visit: Payer: Self-pay

## 2023-06-16 ENCOUNTER — Other Ambulatory Visit: Payer: Self-pay

## 2023-06-19 ENCOUNTER — Other Ambulatory Visit: Payer: Self-pay

## 2023-07-14 ENCOUNTER — Other Ambulatory Visit: Payer: Self-pay

## 2023-07-25 ENCOUNTER — Other Ambulatory Visit: Payer: Self-pay

## 2023-07-27 ENCOUNTER — Other Ambulatory Visit: Payer: Self-pay

## 2023-08-01 ENCOUNTER — Ambulatory Visit: Payer: Self-pay | Admitting: Internal Medicine

## 2023-08-24 ENCOUNTER — Other Ambulatory Visit (HOSPITAL_COMMUNITY): Payer: Self-pay

## 2023-08-24 ENCOUNTER — Other Ambulatory Visit: Payer: Self-pay

## 2023-09-04 ENCOUNTER — Other Ambulatory Visit: Payer: Self-pay

## 2023-09-06 ENCOUNTER — Other Ambulatory Visit: Payer: Self-pay

## 2023-09-06 ENCOUNTER — Other Ambulatory Visit (HOSPITAL_COMMUNITY): Payer: Self-pay

## 2023-09-06 ENCOUNTER — Ambulatory Visit: Payer: Self-pay | Admitting: *Deleted

## 2023-09-06 NOTE — Telephone Encounter (Signed)
Spoke to patient. Due to patient schedule she is unable to [facility] today and on Thursday [facility] location is to far away for patient . Patient placed on walk in schedule for Tuesday. Patient advised to be here at 8:35, this does not mean she has appointment at this time. Advised she will be seen,  just not sure how long she will have to wait. Patient voiced understanding.

## 2023-09-06 NOTE — Telephone Encounter (Signed)
  Chief Complaint: right side / low abdominal pain, multiple issues requesting referrals Symptoms: right side / flank pain low abdominal pain . Discomfort breathing in at times, certain positions causes pain. Can not lay on right side or back. Reports more gas than normal abdominal bloating. Report blood sugars fluctuating getting low at times in the 60's , she eats and gets better but has never had blood sugars get low before. C/o water eyes. C/o knee pain and awaiting knee surgery. Frequency: approx 1 month ago  Pertinent Negatives: Patient denies unable to walk no chest pain or difficulty breathing reported no fever Disposition: [] ED /[] Urgent Care (no appt availability in office) / [] Appointment(In office/virtual)/ []  [facility]/ [] Home Care/ [] Refused Recommended Disposition /[x] [facility]/ []  Follow-up with PCP Additional Notes:   No available appt tomorrow. Offered mobile bus . Unsure if patient will go . Could not make appt today with PCP that was offered. Requesting referral for right side discomfort. Requesting another referral to be placed again for ophthalmologist - could not make appt in the past. requesting PT for knee again . Financial issues regarding rent and paying for knee surgery .  Please advise if case manager can assist with financial advise.     Reason for Disposition  Diabetes mellitus or weak immune system (e.g., HIV positive, cancer chemo, splenectomy, organ transplant, chronic steroids)  (Exception: Mild pain that is only present with movement.)  Answer Assessment - Initial Assessment Questions 1. LOCATION: "Where does it hurt?" (e.g., left, right)     Right side low abdomen and right flank  2. ONSET: "When did the pain start?"     Approx 1 month 3. SEVERITY: "How bad is the pain?" (e.g., Scale 1-10; mild, moderate, or severe)   - MILD (1-3): doesn't interfere with normal activities    - MODERATE (4-7): interferes with normal activities or  awakens from sleep    - SEVERE (8-10): excruciating pain and patient unable to do normal activities (stays in bed)       Discomfort more than pain  4. PATTERN: "Does the pain come and go, or is it constant?"      Comes and goes with intensity 5. CAUSE: "What do you think is causing the pain?"     Not sure  6. OTHER SYMPTOMS:  "Do you have any other symptoms?" (e.g., fever, abdomen pain, vomiting, leg weakness, burning with urination, blood in urine)     Right low abdomen, right flank, abdominal bloating passing more gas than normal. Pain breathing with right side pain, knee pain, watery eyes. 7. PREGNANCY:  "Is there any chance you are pregnant?" "When was your last menstrual period?"     na  Protocols used: Flank Pain-A-AH

## 2023-09-12 ENCOUNTER — Other Ambulatory Visit: Payer: Self-pay

## 2023-09-12 ENCOUNTER — Encounter: Payer: Self-pay | Admitting: Nurse Practitioner

## 2023-09-12 ENCOUNTER — Ambulatory Visit: Payer: Medicaid Other | Attending: Nurse Practitioner | Admitting: Nurse Practitioner

## 2023-09-12 ENCOUNTER — Other Ambulatory Visit (HOSPITAL_COMMUNITY)
Admission: RE | Admit: 2023-09-12 | Discharge: 2023-09-12 | Disposition: A | Discharge status: Home / Self Care | Payer: Medicaid Other | Source: Ambulatory Visit | Attending: Nurse Practitioner | Admitting: Nurse Practitioner

## 2023-09-12 VITALS — BP 127/75 | HR 67 | Ht 67.0 in | Wt 249.4 lb

## 2023-09-12 DIAGNOSIS — R109 Unspecified abdominal pain: Secondary | ICD-10-CM | POA: Diagnosis present

## 2023-09-12 DIAGNOSIS — Z7689 Persons encountering health services in other specified circumstances: Secondary | ICD-10-CM

## 2023-09-12 DIAGNOSIS — M7918 Myalgia, other site: Secondary | ICD-10-CM | POA: Insufficient documentation

## 2023-09-12 LAB — POCT URINE DIPSTICK
Bilirubin, UA: NEGATIVE
Blood, UA: NEGATIVE
Glucose, UA: NEGATIVE mg/dL
Ketones, POC UA: NEGATIVE mg/dL
Leukocytes, UA: NEGATIVE
Nitrite, UA: NEGATIVE
POC PROTEIN,UA: 30 — AB
Spec Grav, UA: 1.025 (ref 1.010–1.025)
Urobilinogen, UA: 0.2 U/dL
pH, UA: 6 (ref 5.0–8.0)

## 2023-09-12 MED ORDER — METHOCARBAMOL 500 MG PO TABS
500.0000 mg | ORAL_TABLET | Freq: Three times a day (TID) | ORAL | 3 refills | Status: DC | PRN
  Filled 2023-09-12: qty 60, 20d supply, fill #0

## 2023-09-12 NOTE — Progress Notes (Signed)
Assessment & Plan:  Haley [name] was seen today for abdominal pain.  Diagnoses and all orders for this visit:  Right flank pain -     CBC with Differential -     Comprehensive metabolic panel -     Wet prep, genital -     methocarbamol (ROBAXIN) 500 MG tablet; Take 1 tablet (500 mg total) by mouth every 8 (eight) hours as needed for muscle spasms. -     POCT URINE DIPSTICK -     Cervicovaginal ancillary only  Encounter for social work intervention -     Ambulatory referral to [facility]    Patient has been counseled on age-appropriate routine health concerns for screening and prevention. These are reviewed and up-to-date. Referrals have been placed accordingly. Immunizations are up-to-date or declined.    Subjective:   Chief Complaint  Patient presents with   Abdominal Pain    More of a discomfort then pain.    Patient presents with right sided pain/pressure and difficulty catching her breath which occurs during sexual activities and certain positions that require her legs to be hyperflexed.  States it is "difficult for me to breathe, and it feels like something is pressing against something" She also says this occurs when she tries to sleep on her back. She has significant abdominal girth with BMI almost 40.  Denies any abdominal pain outside of sexual activity.  Requesting to speak to social worker regarding financial assistance while she is out recovering from knee surgery. Surgery has not been scheduled as she wants to see what her options are and or what she may qualify for in regard to assistance.     Review of Systems  Constitutional:  Negative for fever, malaise/fatigue and weight loss.  HENT: Negative.  Negative for nosebleeds.   Eyes: Negative.  Negative for blurred vision, double vision and photophobia.  Respiratory:  Negative for cough, shortness of breath and wheezing.        Started smoking again  Cardiovascular:  Positive for palpitations.  Negative for chest pain and leg swelling.       Palpitations when working; gets "tired in chest"  Gastrointestinal:  Positive for abdominal pain. Negative for blood in stool, constipation, diarrhea, heartburn, melena, nausea and vomiting.  Genitourinary: Negative.   Musculoskeletal:  Positive for joint pain. Negative for myalgias.       Bilateral shoulder/arm pain  Neurological: Negative.  Negative for dizziness, focal weakness, seizures and headaches.  Psychiatric/Behavioral: Negative.  Negative for suicidal ideas.     Past Medical History:  Diagnosis Date   Essential hypertension    Fibroids    Hyperthyroidism    a. 2010, treated w/ tapazole.   Morbid obesity (HCC)    Paroxysmal atrial fibrillation (HCC)    a. 03/2009 - in setting of hyperthyroidism and caffeine intake, converted spontaneously;  b. 08/2016 ED visit for recurrent PAF-->successful DCCV in ED.   Snores    Type II diabetes mellitus (HCC)     Past Surgical History:  Procedure Laterality Date   UTERINE FIBROID SURGERY      Family History  Problem Relation Age of Onset   Hypertension Mother    Diabetes Mellitus II Sister    Cancer Maternal Grandmother    Breast cancer Neg Hx     Social History Reviewed with no changes to be made today.   Outpatient Medications Prior to Visit  Medication Sig Dispense Refill   atorvastatin (LIPITOR) 20 MG tablet Take 1 tablet (  20 mg total) by mouth daily. 90 tablet 1   Blood Glucose Monitoring Suppl (TRUE METRIX METER) w/Device KIT Use twice daily 1 kit 0   Dulaglutide (TRULICITY) 1.5 MG/0.5ML SOPN Inject 1.5 mg into the skin once a week. 2 mL 3   furosemide (LASIX) 20 MG tablet Take 1 tablet (20 mg total) by mouth daily. 90 tablet 0   gabapentin (NEURONTIN) 100 MG capsule Take 1 capsule (100 mg total) by mouth at bedtime. 90 capsule 2   glipiZIDE (GLUCOTROL) 10 MG tablet Take 1 tablet (10 mg total) by mouth 2 (two) times daily before a meal. 180 tablet 1   glucose blood (TRUE  METRIX BLOOD GLUCOSE TEST) test strip Use as instructed. Check blood glucose level by fingerstick twice per day. 200 each 12   lisinopril (ZESTRIL) 2.5 MG tablet Take 1 tablet (2.5 mg total) by mouth daily. 90 tablet 1   metFORMIN (GLUCOPHAGE-XR) 500 MG 24 hr tablet Take 4 tablets (2,000 mg total) by mouth at bedtime. 360 tablet 1   methimazole (TAPAZOLE) 5 MG tablet Take 1 tablet (5 mg total) by mouth daily. 90 tablet 3   Methylsulfonylmethane (MSM PO) Take 2 tablets by mouth daily.     metoprolol succinate (TOPROL-XL) 50 MG 24 hr tablet Take 1 tablet (50 mg total) by mouth daily. 90 tablet 3   Multiple Vitamins-Minerals (ALIVE WOMENS 50+) TABS Take 1 tablet by mouth daily. 90 tablet 3   TRUEplus Lancets 28G MISC Use twice daily for blood glucose check 200 each 6   apixaban (ELIQUIS) 5 MG TABS tablet Take 1 tablet (5 mg total) by mouth 2 (two) times daily. (Patient not taking: Reported on 09/12/2023) 60 tablet 0   traZODone (DESYREL) 150 MG tablet Take 0.5-1 tablets (75-150 mg total) by mouth at bedtime. (Patient not taking: Reported on 05/23/2023) 90 tablet 1   methocarbamol (ROBAXIN) 500 MG tablet Take 1 tablet (500 mg total) by mouth every 6 (six) hours as needed for muscle spasms. (Patient not taking: Reported on 05/23/2023) 60 tablet 3   No facility-administered medications prior to visit.    No Known Allergies     Objective:    BP 127/75 (BP Location: Left Arm, Patient Position: Sitting, Cuff Size: Normal)   Pulse 67   Ht 5\' 7"  (1.702 m)   Wt 249 lb 6.4 oz (113.1 kg)   LMP  (LMP Unknown)   BMI 39.06 kg/m  Wt Readings from Last 3 Encounters:  09/12/23 249 lb 6.4 oz (113.1 kg)  05/23/23 256 lb 6.4 oz (116.3 kg)  05/09/23 258 lb (117 kg)    Physical Exam Vitals and nursing note reviewed.  Constitutional:      Appearance: She is well-developed.  HENT:     Head: Normocephalic and atraumatic.  Cardiovascular:     Rate and Rhythm: Normal rate and regular rhythm.     Heart  sounds: Normal heart sounds. No murmur heard.    No friction rub. No gallop.  Pulmonary:     Effort: Pulmonary effort is normal. No tachypnea or respiratory distress.     Breath sounds: Normal breath sounds. No decreased breath sounds, wheezing, rhonchi or rales.  Chest:     Chest wall: No tenderness.  Abdominal:     General: Bowel sounds are normal.     Palpations: Abdomen is soft.     Comments: Abdomen obese  Musculoskeletal:        General: Normal range of motion.     Cervical  back: Normal range of motion.  Skin:    General: Skin is warm and dry.  Neurological:     Mental Status: She is alert and oriented to person, place, and time.     Coordination: Coordination normal.  Psychiatric:        Mood and Affect: Mood normal.        Behavior: Behavior normal. Behavior is cooperative.        Thought Content: Thought content normal.        Judgment: Judgment normal.          Patient has been counseled extensively about nutrition and exercise as well as the importance of adherence with medications and regular follow-up. The patient was given clear instructions to go to ER or return to medical center if symptoms don't improve, worsen or new problems develop. The patient verbalized understanding.   Follow-up: Return for Keep scheduled appointment in November.    I have seen and examined this patient with the FNP student Burnard Bunting and agree with the above note .  Claiborne Rigg, FNP-BC [facility] [place], [place] [phone]   09/12/2023, 10:09 PM

## 2023-09-12 NOTE — Patient Instructions (Addendum)
Take muscle relaxer for flank pain Flexibility exercises Social worker will call you regarding social needs

## 2023-09-13 LAB — COMPREHENSIVE METABOLIC PANEL
ALT: 12 [IU]/L (ref 0–32)
AST: 17 [IU]/L (ref 0–40)
Albumin: 4.6 g/dL (ref 3.8–4.9)
Alkaline Phosphatase: 79 [IU]/L (ref 44–121)
BUN/Creatinine Ratio: 18 (ref 9–23)
BUN: 10 mg/dL (ref 6–24)
Bilirubin Total: 0.7 mg/dL (ref 0.0–1.2)
CO2: 25 mmol/L (ref 20–29)
Calcium: 10 mg/dL (ref 8.7–10.2)
Chloride: 105 mmol/L (ref 96–106)
Creatinine, Ser: 0.57 mg/dL (ref 0.57–1.00)
Globulin, Total: 2.9 g/dL (ref 1.5–4.5)
Glucose: 107 mg/dL — ABNORMAL HIGH (ref 70–99)
Potassium: 4.6 mmol/L (ref 3.5–5.2)
Sodium: 146 mmol/L — ABNORMAL HIGH (ref 134–144)
Total Protein: 7.5 g/dL (ref 6.0–8.5)
eGFR: 106 mL/min/{1.73_m2} (ref >59)

## 2023-09-13 LAB — CBC WITH DIFFERENTIAL/PLATELET
Basophils Absolute: 0.1 10*3/uL (ref 0.0–0.2)
Basos: 1 %
EOS (ABSOLUTE): 0.3 10*3/uL (ref 0.0–0.4)
Eos: 3 %
Hematocrit: 49 % — ABNORMAL HIGH (ref 34.0–46.6)
Hemoglobin: 15.9 g/dL (ref 11.1–15.9)
Immature Grans (Abs): 0 10*3/uL (ref 0.0–0.1)
Immature Granulocytes: 0 %
Lymphocytes Absolute: 3 10*3/uL (ref 0.7–3.1)
Lymphs: 37 %
MCH: 31.4 pg (ref 26.6–33.0)
MCHC: 32.4 g/dL (ref 31.5–35.7)
MCV: 97 fL (ref 79–97)
Monocytes Absolute: 0.7 10*3/uL (ref 0.1–0.9)
Monocytes: 9 %
Neutrophils Absolute: 4 10*3/uL (ref 1.4–7.0)
Neutrophils: 50 %
Platelets: 239 10*3/uL (ref 150–450)
RBC: 5.07 x10E6/uL (ref 3.77–5.28)
RDW: 13.1 % (ref 11.7–15.4)
WBC: 8 10*3/uL (ref 3.4–10.8)

## 2023-09-13 LAB — CERVICOVAGINAL ANCILLARY ONLY
Bacterial Vaginitis (gardnerella): NEGATIVE
Candida Glabrata: NEGATIVE
Candida Vaginitis: POSITIVE — AB
Chlamydia: NEGATIVE
Comment: NEGATIVE
Comment: NEGATIVE
Comment: NEGATIVE
Comment: NEGATIVE
Comment: NEGATIVE
Comment: NORMAL
Neisseria Gonorrhea: NEGATIVE
Trichomonas: NEGATIVE

## 2023-09-20 ENCOUNTER — Other Ambulatory Visit: Payer: Self-pay

## 2023-09-20 ENCOUNTER — Other Ambulatory Visit: Payer: Self-pay | Admitting: Nurse Practitioner

## 2023-09-20 DIAGNOSIS — B3731 Acute candidiasis of vulva and vagina: Secondary | ICD-10-CM

## 2023-09-20 MED ORDER — FLUCONAZOLE 150 MG PO TABS
150.0000 mg | ORAL_TABLET | Freq: Once | ORAL | 0 refills | Status: AC
Start: 2023-09-20 — End: 2023-09-23
  Filled 2023-09-20: qty 1, 1d supply, fill #0

## 2023-09-22 ENCOUNTER — Other Ambulatory Visit: Payer: Self-pay

## 2023-09-26 ENCOUNTER — Ambulatory Visit (HOSPITAL_COMMUNITY): Payer: Medicaid Other | Admitting: Clinical

## 2023-09-27 ENCOUNTER — Other Ambulatory Visit: Payer: Self-pay

## 2023-10-02 ENCOUNTER — Ambulatory Visit (HOSPITAL_COMMUNITY): Payer: Medicaid Other | Admitting: Clinical

## 2023-10-11 ENCOUNTER — Telehealth: Payer: Self-pay | Admitting: Cardiovascular Disease

## 2023-10-11 ENCOUNTER — Ambulatory Visit: Payer: Medicaid Other | Admitting: Nurse Practitioner

## 2023-10-11 NOTE — Telephone Encounter (Signed)
Pt states she needs to talk with someone about her CPAP machine not working properly. Please advise

## 2023-10-19 ENCOUNTER — Other Ambulatory Visit: Payer: Self-pay

## 2023-10-19 ENCOUNTER — Other Ambulatory Visit: Payer: Self-pay | Admitting: Nurse Practitioner

## 2023-10-19 DIAGNOSIS — E1169 Type 2 diabetes mellitus with other specified complication: Secondary | ICD-10-CM

## 2023-10-19 DIAGNOSIS — E7800 Pure hypercholesterolemia, unspecified: Secondary | ICD-10-CM

## 2023-10-19 MED ORDER — ATORVASTATIN CALCIUM 20 MG PO TABS
20.0000 mg | ORAL_TABLET | Freq: Every day | ORAL | 1 refills | Status: DC
  Filled 2023-10-19 – 2023-11-10 (×3): qty 90, 90d supply, fill #0
  Filled 2024-02-14: qty 90, 90d supply, fill #1

## 2023-10-19 MED ORDER — GLIPIZIDE 10 MG PO TABS
10.0000 mg | ORAL_TABLET | Freq: Two times a day (BID) | ORAL | 0 refills | Status: DC
  Filled 2023-10-19 – 2023-11-10 (×3): qty 180, 90d supply, fill #0

## 2023-10-19 NOTE — Telephone Encounter (Signed)
Requested by interface surescripts. Courtesy refill. Future visit in 1 week.  Requested Prescriptions  Pending Prescriptions Disp Refills   glipiZIDE (GLUCOTROL) 10 MG tablet 180 tablet 0    Sig: Take 1 tablet (10 mg total) by mouth 2 (two) times daily before a meal.     Endocrinology:  Diabetes - Sulfonylureas Passed - 10/19/2023 11:09 AM      Passed - HBA1C is between 0 and 7.9 and within 180 days    Hgb A1c MFr Bld  Date Value Ref Range Status  05/09/2023 6.2 (H) 4.8 - 5.6 % Final    Comment:             Prediabetes: 5.7 - 6.4          Diabetes: >6.4          Glycemic control for adults with diabetes: <7.0          Passed - Cr in normal range and within 360 days    Creatinine, Ser  Date Value Ref Range Status  09/12/2023 0.57 0.57 - 1.00 mg/dL Final         Passed - Valid encounter within last 6 months    Recent Outpatient Visits           1 month ago Right flank pain   [facility] [place]. [facility] [place], [place] W, NP   5 months ago Type 2 diabetes mellitus with other specified complication, without long-term current use of insulin (HCC)   [facility] - [facility] Dept Of [place]. [facility] Claiborne Rigg, NP   8 months ago Chronic pain of both knees   [facility] - [facility] Dept Of [place]. [facility] Claiborne Rigg, NP   1 year ago Insomnia secondary to situational depression   [facility] - [facility] Dept Of [place]. [facility] Claiborne Rigg, NP   1 year ago Chronic pain of both knees   [facility] Dept Of [place]. [facility] Claiborne Rigg, NP       Future Appointments             In 5 days Persons, [place], [place] Sasser   In 1 week [place], Marzella Schlein, PA-C [facility] - A Dept Of [place]. [facility]   In 2 months Lennette Bihari, MD [place]  [facility]             atorvastatin (LIPITOR) 20 MG tablet 90 tablet 1    Sig: Take 1 tablet (20 mg total) by mouth daily.     Cardiovascular:  Antilipid - Statins Failed - 10/19/2023 11:09 AM      Failed - Lipid Panel in normal range within the last 12 months    Cholesterol, Total  Date Value Ref Range Status  12/16/2022 101 100 - 199 mg/dL Final   LDL Chol Calc (NIH)  Date Value Ref Range Status  12/16/2022 47 0 - 99 mg/dL Final   HDL  Date Value Ref Range Status  12/16/2022 40 >39 mg/dL Final   Triglycerides  Date Value Ref Range Status  12/16/2022 65 0 - 149 mg/dL Final         Passed - Patient is not pregnant      Passed -  Valid encounter within last 12 months    Recent Outpatient Visits           1 month ago Right flank pain   [facility] Dept Of [place]. [facility] [place], [place] W, NP   5 months ago Type 2 diabetes mellitus with other specified complication, without long-term current use of insulin (HCC)   [facility] - A Dept Of [place]. [facility] Claiborne Rigg, NP   8 months ago Chronic pain of both knees   [facility] - [facility] Dept Of [place]. [facility] Claiborne Rigg, NP   1 year ago Insomnia secondary to situational depression   [facility] - A Dept Of [place]. [facility] Claiborne Rigg, NP   1 year ago Chronic pain of both knees   [facility] [place] - A Dept Of [place]. [facility] Claiborne Rigg, NP       Future Appointments             In 5 days Persons, [place], [place]   In 1 week [place], Marzella Schlein, PA-C [facility] - A Dept Of [place]. [facility]   In 2 months Tresa Endo [place] Pu, MD [facility]

## 2023-10-20 ENCOUNTER — Other Ambulatory Visit: Payer: Self-pay

## 2023-10-24 ENCOUNTER — Ambulatory Visit (INDEPENDENT_AMBULATORY_CARE_PROVIDER_SITE_OTHER): Payer: Medicaid Other | Admitting: Physician Assistant

## 2023-10-24 ENCOUNTER — Encounter: Payer: Self-pay | Admitting: Physician Assistant

## 2023-10-24 DIAGNOSIS — M1712 Unilateral primary osteoarthritis, left knee: Secondary | ICD-10-CM | POA: Diagnosis not present

## 2023-10-24 DIAGNOSIS — M170 Bilateral primary osteoarthritis of knee: Secondary | ICD-10-CM

## 2023-10-24 DIAGNOSIS — M1711 Unilateral primary osteoarthritis, right knee: Secondary | ICD-10-CM | POA: Diagnosis not present

## 2023-10-24 MED ORDER — LIDOCAINE HCL 1 % IJ SOLN
3.0000 mL | INTRAMUSCULAR | Status: AC | PRN
Start: 2023-10-24 — End: 2023-10-24
  Administered 2023-10-24: 3 mL

## 2023-10-24 MED ORDER — METHYLPREDNISOLONE ACETATE 40 MG/ML IJ SUSP
40.0000 mg | INTRAMUSCULAR | Status: AC | PRN
Start: 2023-10-24 — End: 2023-10-24
  Administered 2023-10-24: 40 mg via INTRA_ARTICULAR

## 2023-10-24 MED ORDER — METHYLPREDNISOLONE ACETATE 40 MG/ML IJ SUSP
40.0000 mg | INTRAMUSCULAR | Status: AC | PRN
  Administered 2023-10-24: 40 mg via INTRA_ARTICULAR

## 2023-10-24 NOTE — Progress Notes (Addendum)
 Office Visit Note   Patient: Haley [name]           Date of Birth: 01/23/1966           MRN: [id number] Visit Date: 10/24/2023              Requested by: Claiborne Rigg, NP [address] [place] [address] [place],  [place] [address] PCP: Claiborne Rigg, NP  No chief complaint on file.     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  She has knee injections which have helped her significantly.  Her most recent A1c is 6.2  Assessment & Plan: Visit Diagnoses: Osteoarthritis bilateral knees  Plan: Will go forward with injections today may follow-up as needed.  Would like to consider viscosupplementation This patient is diagnosed with osteoarthritis of the knee(s).    Radiographs show evidence of joint space narrowing, osteophytes, subchondral sclerosis and/or subchondral cysts.  This patient has knee pain which interferes with functional and activities of daily living.    This patient has experienced inadequate response, adverse effects and/or intolerance with conservative treatments such as acetaminophen, NSAIDS, topical creams, physical therapy or regular exercise, knee bracing and/or weight loss.   This patient has experienced inadequate response or has a contraindication to intra articular steroid injections for at least 3 months.   This patient is not scheduled to have a total knee replacement within 6 months of starting treatment with viscosupplementation.   Follow-Up Instructions: No follow-ups on file.   Ortho Exam  Patient is alert, oriented, no adenopathy, well-dressed, normal affect, normal respiratory effort. Bilateral knees no effusion no erythema compartments are soft and compressible she is neurologically intact  Imaging: No results found. No images are attached to the encounter.  Labs: Lab Results  Component Value Date   HGBA1C 6.2 (H) 05/09/2023   HGBA1C 6.6 (A) 12/19/2022    HGBA1C 6.5 (H) 05/23/2022     Lab Results  Component Value Date   ALBUMIN 4.6 09/12/2023   ALBUMIN 4.6 05/09/2023   ALBUMIN 4.4 12/16/2022    Lab Results  Component Value Date   MG 1.9 12/24/2019   MG 1.6 (L) 12/23/2019   MG 1.5 (L) 12/22/2019   No results found for: "VD25OH"  No results found for: "PREALBUMIN"    Latest Ref Rng & Units 09/12/2023   10:11 AM 12/16/2022   12:05 PM 07/10/2022    9:55 PM  CBC EXTENDED  WBC 3.4 - 10.8 x10E3/uL 8.0  7.3  8.0   RBC 3.77 - 5.28 x10E6/uL 5.07  4.10  4.41   Hemoglobin 11.1 - 15.9 g/dL 42.5  95.6  38.7   HCT 34.0 - 46.6 % 49.0  38.1  42.5   Platelets 150 - 450 x10E3/uL 239  415  253   NEUT# 1.4 - 7.0 x10E3/uL 4.0   3.6   Lymph# 0.7 - 3.1 x10E3/uL 3.0   3.4      There is no height or weight on file to calculate BMI.  Orders:  No orders of the defined types were placed in this encounter.  No orders of the defined types were placed in this encounter.    Procedures: Large Joint Inj: bilateral knee on 10/24/2023 3:48 PM Indications: pain and diagnostic evaluation Details: 25 G 1.5 in needle, anteromedial approach  Arthrogram: No  Medications (Right): 3 mL lidocaine 1 %; 40 mg methylPREDNISolone acetate 40 MG/ML Medications (Left): 3  mL lidocaine 1 %; 40 mg methylPREDNISolone acetate 40 MG/ML Outcome: tolerated well, no immediate complications Procedure, treatment alternatives, risks and benefits explained, specific risks discussed. Consent was given by the patient. Immediately prior to procedure a time out was called to verify the correct patient, procedure, equipment, support staff and site/side marked as required. Patient was prepped and draped in the usual sterile fashion.    Clinical Data: No additional findings.  ROS:  All other systems negative, except as noted in the HPI. Review of Systems  Objective: Vital Signs: LMP  (LMP Unknown)   Specialty Comments:  No specialty comments available.  PMFS  History: Patient Active Problem List   Diagnosis Date Noted   Pain in abdominal muscle of right flank 09/12/2023   Bilateral knee pain 02/21/2023   Osteoarthritis of knees, bilateral 02/21/2023   Chronic diastolic CHF (congestive heart failure) (HCC) 02/17/2022   Abnormal vaginal bleeding in postmenopausal patient 11/25/2020   Goiter with hyperthyroidism    UARS (upper airway resistance syndrome)    Hyperthyroidism    Acute diastolic (congestive) heart failure (HCC) 12/21/2019   Hypoxia 12/21/2019   Substernal thyroid goiter 02/19/2019   Current use of long term anticoagulation 02/19/2019   Colon cancer screening 02/19/2019   Pure hypercholesterolemia 02/19/2019   Breast cancer screening 02/19/2019   Class 3 severe obesity due to excess calories with serious comorbidity and body mass index (BMI) of 45.0 to 49.9 in adult (HCC) 01/27/2019   Hypomagnesemia 01/27/2019   Type II diabetes mellitus (HCC) 01/01/2019   Severe sleep apnea 01/14/2017   Hypoxemia 01/14/2017   Paroxysmal atrial fibrillation (HCC)    Essential hypertension    Past Medical History:  Diagnosis Date   Essential hypertension    Fibroids    Hyperthyroidism    a. 2010, treated w/ tapazole.   Morbid obesity (HCC)    Paroxysmal atrial fibrillation (HCC)    a. 03/2009 - in setting of hyperthyroidism and caffeine intake, converted spontaneously;  b. 08/2016 ED visit for recurrent PAF-->successful DCCV in ED.   Snores    Type II diabetes mellitus (HCC)     Family History  Problem Relation Age of Onset   Hypertension Mother    Diabetes Mellitus II Sister    Cancer Maternal Grandmother    Breast cancer Neg Hx     Past Surgical History:  Procedure Laterality Date   UTERINE FIBROID SURGERY     Social History   Occupational History   Occupation: cosmetologist  Tobacco Use   Smoking status: Every Day    Types: Cigars   Smokeless tobacco: Never   Tobacco comments:    patient vapes  Vaping Use   Vaping  status: Former  Substance and Sexual Activity   Alcohol use: Yes    Comment: occasional drink.   Drug use: No   Sexual activity: Yes

## 2023-10-26 ENCOUNTER — Ambulatory Visit: Payer: Medicaid Other | Admitting: Physician Assistant

## 2023-10-31 ENCOUNTER — Telehealth: Payer: Self-pay | Admitting: Cardiovascular Disease

## 2023-10-31 ENCOUNTER — Other Ambulatory Visit: Payer: Self-pay

## 2023-10-31 ENCOUNTER — Telehealth: Payer: Self-pay | Admitting: Licensed Clinical Social Worker

## 2023-10-31 ENCOUNTER — Ambulatory Visit (HOSPITAL_COMMUNITY): Payer: Medicaid Other | Admitting: Clinical

## 2023-10-31 ENCOUNTER — Other Ambulatory Visit: Payer: Self-pay | Admitting: Nurse Practitioner

## 2023-10-31 ENCOUNTER — Other Ambulatory Visit: Payer: Self-pay | Admitting: Family Medicine

## 2023-10-31 DIAGNOSIS — E1169 Type 2 diabetes mellitus with other specified complication: Secondary | ICD-10-CM

## 2023-10-31 DIAGNOSIS — I5032 Chronic diastolic (congestive) heart failure: Secondary | ICD-10-CM

## 2023-10-31 MED ORDER — METFORMIN HCL ER 500 MG PO TB24
2000.0000 mg | ORAL_TABLET | Freq: Every day | ORAL | 0 refills | Status: DC
  Filled 2023-10-31 – 2023-11-10 (×2): qty 360, 90d supply, fill #0

## 2023-10-31 MED ORDER — FUROSEMIDE 20 MG PO TABS
20.0000 mg | ORAL_TABLET | Freq: Every day | ORAL | 0 refills | Status: DC
Start: 2023-10-31 — End: 2023-11-16
  Filled 2023-10-31 – 2023-11-10 (×2): qty 90, 90d supply, fill #0

## 2023-10-31 NOTE — Telephone Encounter (Signed)
Copied from CRM [id number]. Topic: General - Other >> Oct 31, 2023 10:08 AM Turkey B wrote: Reason for CRM: patient called in to speak with Reginia Naas about a behavior health appt. Please cb

## 2023-10-31 NOTE — Telephone Encounter (Signed)
Pt states she needs to speak with a nurse about her CPAP machine. Please advise

## 2023-11-01 ENCOUNTER — Telehealth: Payer: Self-pay | Admitting: Licensed Clinical Social Worker

## 2023-11-01 ENCOUNTER — Other Ambulatory Visit: Payer: Self-pay

## 2023-11-01 NOTE — Telephone Encounter (Signed)
Call patient to assess mental health needs.Patient stated that she is experiencing grief of her mother and her husband and recently picked up drinking and smoking since both occurrences. Patient stated that she does not have any outlets or resources. Her next appointment with behavioral health is not until February so she wanted to speak with LCSWA in the meantime. Patient has appointment set. We also sent a message to the patient in my chart with emergency utilities and housing resources as listed below.  Housing Resources  [place] Housing Search: Affordable housing database. Visit the website below to search for housing in your  budget and location parameters  https://www.jackson-fischer.com/  519-675-1960  Affordable Housing Management, Inc: Property management for several affordable housing  communities in the area. Contact individual properties for availability.  http://www.ToledoAutomobile.co.uk  (737) 737-7770  [employer]: assists with homelessness prevention, foreclosure prevention, healthy  homes (assistance to residents who live in homes with health and safety hazards through education,  referrals, and tenant advocacy)  Mon - Fri 8:30 am - 4:30 pm  [phone]  Interactive Resource Center [facility]): day center for people experiencing homelessness; offers resources  including showers, laundry, phones, mailroom, computer lab, medical clinic, bike maintenance, case  managers.  [address], [place] [address]  Sheral Flow - Fri 8:00 am - 3:00 pm   [phone]  [employer]: [employer] (PBV) waiting list specifically for (62 years of age  and older) Near-Elderly and Elderly Only is now open. Housing Choice Voucher (Section 8) waiting list  remains closed. Apply online.  [address] [place], [place] [address]  [phone]  http://www.gha-[place].org/  [employer] Authority: Has Section 8 and project-based voucher waiting lists open. Complete the   application online.  [address], [place], [place] [address]  [phone]  FootballPromos.co.nz  Kelly Services: Has wait lists open. Call to request an application be mailed to you or go to  their office to pick up an application.  [address], [place], [place] [address]  [phone]  NameHandles.gl     Advocacy/Legal Legal Aid [place]:  ([phone]  /  [phone] /  LVM, taking clients  Family Justice Center:  ([phone] /  Onsite, counseling with Johny Shears is virtual, Accepting new clients   Family Service of the [employer] 24-hr Crisis line:  [phone] Virtual & Onsite services (Client preference), Accepting New clients  [employer], [place]:  [phone] Virtual & Onsite services (Client preference), Accepting New clients  Court Watch (custody):  ([phone] Virtual, Accepting new clients  [employer] Law Clinic:   ([phone] Virtual/Telephone, accepting clients for waitlisting (time depends on services)   Baby & Breastfeeding [place] Lactation [phone] Outpatient consultant out for weeks (will be hard to get an appointment) , Support group offered Virtually (Accepting new members)  [facility] Lactation [phone] Telephone & Onsite services (Client preference), Accepting New clients  WIC: [phone] ([place]);  [phone] ([place]) Virtual  [place] League:  [phone]     Childcare [place] Child Development: [phone] ([place]) / ([phone] ([place])             - Child Care Resources/ Referrals/ Scholarships             - Head Start/ Early Head Start (call or apply online) Virtually (by appointment), Accepting new families  [place] DHHS: [place] Pre-K :  [phone] / [phone]     Employment / Job Search [employer] of [place]: [phone] / [address] Human resources officer (Client preference),  Accepting New clients  [place] [employer] (JobLink): ([phone] ([place]) / [phone] ([place]) Virtual &  Onsite workshops, Accepting new clients  [employer] laboratory technician Resource/ Career Center: [phone] / [phone] Virtual & Onsite , Accepting New clients  [facility] Job & Career Center: [phone]   DHHS Work First: [phone] ([place]) / ([phone] ([place]) Virtually, Accepting clients   [facility] [place]:  9847252303  Virtual and Onsite, Accepting new clients     Financial Assistance [facility]:  (681)703-0436 Virtual (financial assistance) & Onsite (all other services), Accepting new families  Salvation Army: 202-191-5797 [employer] Network (furniture):  3803918646   [facility] Helping Hands: [phone]   Low Income Energy Assistance: (361) [phone] Virtual, accepting new families    Food Assistance DHHS- SNAP/ Food Stamps: 516-450-4771 Virtual  WIC: [place]819-113-5936 ;  [place] ([phone])[phone] Virtual        During the summer, text "FOOD" to 902409     General Health / Clinics (Adults) Orange Card (for Adults) through [facility]: 9092905529    [place] [facility]:   [phone]   [facility] Health & Wellness:   ([phone]   Health Department:  2724028436   [facility]:  [phone] / [phone]   Planned Parenthood of [place]:   9141213563 Onsite, Accepting new patients  [facility]:   [phone] Onsite , Accepting new patients    Housing [facility] Housing Coalition:   719-427-4635   [facility] Housing Authority:  684-698-7551   Affordable Housing Managemnt:  734-580-4513     Immigrant/ Refugee Center for [facility]):  762-010-7277 Onsite, Accepting new people  Faith [employer] International House:  (520)176-7697 Virtual, accepting new individuals  [employer]:  [phone] Onsite & Virtual, Accepting new individuals  Parks Ranger Services:  ([phone] Virtual, Accepting new clients  African Services Coalition:   647-262-8129     LGBTQ Youth SAFE  www.youthsafegso.org  Virtual, Accepting new members  PFLAG  (519)573-1681 / info@[place] .org  Virtual, Accepting new Members  [facility]:  872-101-7748  Virtual    Mental Health/ Substance Use Family Service of the [facility]  [phone] [address] [phone] Virtual & Onsite services (Client preference), Accepting New clients  [facility]:  ([phone] or [phone] [address] [place], [place] [address] Onsite & Virtual, Accepting new clients  [facility]:  ([phone] [address] [place], [place] [address] Virtual & Onsite, Accepting new clients  [facility]:  [phone] Rozanna Boer [place], [place]) [address] [place], [place] [address] Onsite & Virtual, Accepting new clients  [place] (walk-ins)  [phone] / [address]   [place]:  [phone] Virtual meetings via Zoom- need meeting passcode- call [phone] to receive code "AFG"= [place]-Anon Family Group  [place]alanon.org/find-meetings   Alcoholics Anonymous:  7031327694 TonerProviders.com.cy  Narcotics Anonymous:  302-854-0596 24 hour helpline: 6107081500  Quit Smoking Hotline:  800-QUIT-NOW 859-039-8424)    [facility]                                              [phone] [address] [place], [place], [place] [address] Onsite & Virtual, Accepting new clients    COUNSELING AGENCIES in [place] (Accepting Medicaid)   Mental Health  (* = Spanish available;  + = Psychiatric services) *  Family Service of the [facility]                                [phone] [address] [place], [place] [address] Virtual & Onsite services (Client preference), Accepting New clients  *+ [facility]:                                        708-306-6174 or [phone] Virtual & Onsite, Accepting clients  +[facility] Total Access Care                                [phone]    [facility]:                                                 [phone]   + [facility]:                                           [phone] Onsite & Virtual, Accepting new clients  [facility]                               [phone] Onsite, Accepting new clients  * Family Solutions:                                                     ([phone] Virtual, NOT accepting new clients  The Social Emotional Learning (SEL) Group           (916)772-3831 Virtual, accepting new clients  Youth Focus:                                                            (202)[phone] Onsite & Virtual, Accepting new clients  [facility]:                                        [phone] Onsite & Virtual, Waitlist 6-8 months for services  [place] [facility] Consortium:                             724-212-9820   *[facility]                                                [phone] Onsite & Virtual, Accepting new clients  + [facility]:  307-693-6419 or 747-078-1498   [facility]                                                    [phone] Onsite & Virtual, Accepting new clients  *+ Vesta Mixer (walk-ins)                                                [phone] / [address]   [facility]                                              [phone] Onsite & Virtual, Accepting new clients  Youth Unlimited (PCIT)                                              7803899721 Onsite & Virtual, At Capacity (check in occasionally , subject to change)    Substance Use [place]:                                (779)668-2157  Alcoholics Anonymous:      (703)743-1765  Narcotics Anonymous:       571-872-1838  Quit Smoking Hotline:         800-QUIT-NOW 646-677-4361[facility]986 288 9779 Provides information on mental health, intellectual/developmental disabilities & substance abuse  services in [facility]     Parenting Children's Home Society:  2676288378 Virtual , Accepting families  YWCA: 4452197793   UNCG: Bringing Out the Best:  365-522-8103              Thriving at Three (Hispanic families): 404-781-3646 Onsite, Accepting new children ( short wait list)  Healthy Start (Family Service of the [place]):  336-528-5397 [phone]   Parents as Teachers:  (276)734-9431 Virtually, accepting families ( waitlist for Spanish speaking families )  [place] Child Counsellor- Learning Together (Immigrants): 831-628-0743     Poison Control [phone]   Sports & Recreation [employer] Open Doors Application: https://www.rich.com/ Onsite, Accepting new families  [place] of [place] Recreation Centers: http://www.[place].gov/index.aspx?page=3615 Onsite    Special Needs Family Support Network:  (570)340-1159 Virtual, Accepting new families  Autism Society of [place]:   (670)475-7409 734-593-0493 or (604)629-0067 /  226-203-8953 Virtual, Accepting new families   [facility]:  646-721-8786 Virtual, Accepting families  ARC of [place]:  (737) [phone] Virtual, Accepting new families  Children's Developmental Service Agency (CDSA):  (484) 470-2454 Virtual, Accepting new families  CC4C (Care Coordination for Children):  [phone] Virtual, accepting new patients     Transportation Medicaid Transportation: 415 523 9907 to apply  [place]ie Piles Authority: 612-529-6927 (reduced-fare bus ID to Medicaid/ Medicare/ Orange Card)  SCAT Paratransit services: Eligible riders only, call 680-324-1982 for application    Tutoring/ Mentoring [employer] Child [employer] Institute: (769)825-5955 No tutoring only afterschool programming (In Person), Accepting new students  Big Brothers/ Big Sisters: 3852654387 ([place])  509-480-6726 ([place])   ACES through child's school: (619)564-7968  [employer] Achievers: contact your local Y In Person, Accepting New students  SHIELD Mentor Program:  (570)177-5889 Will re-launch in the fall   Updated 03/2020   Updated 02/2022

## 2023-11-06 ENCOUNTER — Ambulatory Visit: Payer: Medicaid Other | Attending: Licensed Clinical Social Worker | Admitting: Licensed Clinical Social Worker

## 2023-11-06 DIAGNOSIS — Z7189 Other specified counseling: Secondary | ICD-10-CM

## 2023-11-06 DIAGNOSIS — F4321 Adjustment disorder with depressed mood: Secondary | ICD-10-CM

## 2023-11-08 NOTE — BH Specialist Note (Signed)
Integrated Behavioral Health Initial In-Person Visit  MRN: [id number] Name: Haley [name]  Number of Integrated Behavioral Health Clinician visits: 1- Initial Visit  Session Start time: 1505    Session End time: 1600  Total time in minutes: 55   Types of Service: Individual psychotherapy and Introduction only  Interpretor:No. Interpretor Name and Language: n/a   Warm Hand Off Completed.   Subjective: Haley [name] is a 57 y.o. female accompanied by  herself. Patient was referred by PCP for grief counseling. Patient reports the following symptoms/concerns: little interest, trouble sleeping, low energy Duration of problem: couple of years; Severity of problem: moderate  Objective: Mood: Euthymic and Affect: Appropriate Risk of harm to self or others: No plan to harm self or others  Life Context: Family and Social: Patient shared that she lives alone. 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 Self-Care: Patient did not share Life Changes: Patient recently lost her husband  Patient and/or Family's Strengths/Protective Factors: Concrete supports in place (healthy food, safe environments, etc.)  Goals Addressed: Patient will: Reduce symptoms of: depression and insomnia Increase knowledge and/or ability of: coping skills and self-management skills  Demonstrate ability to: Increase healthy adjustment to current life circumstances  Progress towards Goals: Ongoing  Interventions: Interventions utilized: Mindfulness or Relaxation Training and Supportive Counseling  Standardized Assessments completed: GAD-7 and PHQ 9  Patient and/or Family Response: Patient is struggling with some mild symptoms of depression.  Patient stated that since her husband's death her drinking and smoking has increased patient does not like that for herself so she would like coping skills to reduce her drinking  and smoking.  He coping skills to better help sleep.  Patient Centered Plan: Patient is on the following Treatment Plan(s):  grief   Assessment: Patient currently experiencing little interest, trouble sleeping, low energy and increase in smoking and drinking.   Patient may benefit from ongoing support from [facility] and mindfulness exercises to help relax and sleep.  Plan: Follow up with behavioral health clinician on : 4weeks Behavioral recommendations: from [facility] and mindfulness exercises to help relax and sleep. Referral(s): Integrated [employer] (In Clinic) "From scale of 1-10, how likely are you to follow plan?": not sure  Vassie Loll, LCSWA

## 2023-11-10 ENCOUNTER — Other Ambulatory Visit: Payer: Self-pay | Admitting: Nurse Practitioner

## 2023-11-10 ENCOUNTER — Other Ambulatory Visit: Payer: Self-pay | Admitting: Cardiovascular Disease

## 2023-11-10 ENCOUNTER — Other Ambulatory Visit: Payer: Self-pay

## 2023-11-10 DIAGNOSIS — E1169 Type 2 diabetes mellitus with other specified complication: Secondary | ICD-10-CM

## 2023-11-10 DIAGNOSIS — I480 Paroxysmal atrial fibrillation: Secondary | ICD-10-CM

## 2023-11-10 MED ORDER — TRULICITY 1.5 MG/0.5ML ~~LOC~~ SOAJ
1.5000 mg | SUBCUTANEOUS | 3 refills | Status: DC
  Filled 2023-11-10 – 2023-12-04 (×2): qty 2, 28d supply, fill #0
  Filled 2024-01-04: qty 2, 28d supply, fill #1
  Filled 2024-02-08: qty 2, 28d supply, fill #2
  Filled 2024-03-20: qty 2, 28d supply, fill #3

## 2023-11-13 ENCOUNTER — Other Ambulatory Visit: Payer: Self-pay | Admitting: Cardiovascular Disease

## 2023-11-13 DIAGNOSIS — R0609 Other forms of dyspnea: Secondary | ICD-10-CM

## 2023-11-13 DIAGNOSIS — E785 Hyperlipidemia, unspecified: Secondary | ICD-10-CM

## 2023-11-13 DIAGNOSIS — I10 Essential (primary) hypertension: Secondary | ICD-10-CM

## 2023-11-13 DIAGNOSIS — G4733 Obstructive sleep apnea (adult) (pediatric): Secondary | ICD-10-CM

## 2023-11-16 ENCOUNTER — Ambulatory Visit: Payer: Medicaid Other | Attending: Physician Assistant | Admitting: Physician Assistant

## 2023-11-16 ENCOUNTER — Other Ambulatory Visit: Payer: Self-pay

## 2023-11-16 ENCOUNTER — Encounter: Payer: Self-pay | Admitting: Physician Assistant

## 2023-11-16 VITALS — BP 125/76 | HR 72 | Wt 251.2 lb

## 2023-11-16 DIAGNOSIS — Z7985 Long-term (current) use of injectable non-insulin antidiabetic drugs: Secondary | ICD-10-CM

## 2023-11-16 DIAGNOSIS — L84 Corns and callosities: Secondary | ICD-10-CM

## 2023-11-16 DIAGNOSIS — E1169 Type 2 diabetes mellitus with other specified complication: Secondary | ICD-10-CM

## 2023-11-16 DIAGNOSIS — Z7984 Long term (current) use of oral hypoglycemic drugs: Secondary | ICD-10-CM

## 2023-11-16 DIAGNOSIS — Z1239 Encounter for other screening for malignant neoplasm of breast: Secondary | ICD-10-CM

## 2023-11-16 DIAGNOSIS — E1165 Type 2 diabetes mellitus with hyperglycemia: Secondary | ICD-10-CM | POA: Diagnosis not present

## 2023-11-16 DIAGNOSIS — G8929 Other chronic pain: Secondary | ICD-10-CM

## 2023-11-16 DIAGNOSIS — M25561 Pain in right knee: Secondary | ICD-10-CM | POA: Diagnosis not present

## 2023-11-16 DIAGNOSIS — N898 Other specified noninflammatory disorders of vagina: Secondary | ICD-10-CM

## 2023-11-16 DIAGNOSIS — M25562 Pain in left knee: Secondary | ICD-10-CM

## 2023-11-16 DIAGNOSIS — I5032 Chronic diastolic (congestive) heart failure: Secondary | ICD-10-CM

## 2023-11-16 DIAGNOSIS — I480 Paroxysmal atrial fibrillation: Secondary | ICD-10-CM

## 2023-11-16 LAB — POCT GLYCOSYLATED HEMOGLOBIN (HGB A1C): HbA1c, POC (controlled diabetic range): 6 % (ref 0.0–7.0)

## 2023-11-16 LAB — GLUCOSE, POCT (MANUAL RESULT ENTRY): POC Glucose: 76 mg/dL (ref 70–99)

## 2023-11-16 MED ORDER — APIXABAN 5 MG PO TABS
5.0000 mg | ORAL_TABLET | Freq: Two times a day (BID) | ORAL | 0 refills | Status: DC
  Filled 2023-11-16 – 2023-12-04 (×2): qty 60, 30d supply, fill #0

## 2023-11-16 MED ORDER — FUROSEMIDE 20 MG PO TABS
20.0000 mg | ORAL_TABLET | Freq: Every day | ORAL | 0 refills | Status: DC
  Filled 2023-11-16 – 2024-02-14 (×2): qty 90, 90d supply, fill #0

## 2023-11-16 MED ORDER — LISINOPRIL 2.5 MG PO TABS
2.5000 mg | ORAL_TABLET | Freq: Every day | ORAL | 1 refills | Status: DC
  Filled 2023-11-16 – 2024-01-18 (×2): qty 90, 90d supply, fill #0
  Filled 2024-04-19: qty 90, 90d supply, fill #1

## 2023-11-16 MED ORDER — METFORMIN HCL ER 500 MG PO TB24
2000.0000 mg | ORAL_TABLET | Freq: Every day | ORAL | 0 refills | Status: DC
  Filled 2023-11-16 – 2024-02-08 (×2): qty 360, 90d supply, fill #0

## 2023-11-16 MED ORDER — GLIPIZIDE 10 MG PO TABS
10.0000 mg | ORAL_TABLET | Freq: Two times a day (BID) | ORAL | 0 refills | Status: DC
  Filled 2023-11-16 – 2024-02-08 (×2): qty 180, 90d supply, fill #0

## 2023-11-16 NOTE — Progress Notes (Signed)
Patient ID: Haley [name], female   DOB: 1966/04/01, 57 y.o.   MRN: [id number]   Haley [name], is a 57 y.o. female  LKG:[id number]  GUY:[id number]  DOB - 1966-06-11  Chief Complaint  Patient presents with   Medical Management of Chronic Issues       Subjective:   Haley [name] is a 57 y.o. female here today for needing some med RF.  She just had knee injections about 2 weeks ago with ortho.  They are talking about knee replacement with her.  She is complaint with meds.    She has two sore areas on her feet/toes she wants me to check.    She is having vaginal discharge and wants to do a self-swab.  No pelvic pain  She denies any SOB/dizziness/CP/HA.  Needs ophthalmology referral and Mammo ordered.  Last mammo 10/2022 and normal.    No problems updated.  ALLERGIES: No Known Allergies  PAST MEDICAL HISTORY: Past Medical History:  Diagnosis Date   Essential hypertension    Fibroids    Hyperthyroidism    a. 2010, treated w/ tapazole.   Morbid obesity (HCC)    Paroxysmal atrial fibrillation (HCC)    a. 03/2009 - in setting of hyperthyroidism and caffeine intake, converted spontaneously;  b. 08/2016 ED visit for recurrent PAF-->successful DCCV in ED.   Snores    Type II diabetes mellitus (HCC)     MEDICATIONS AT HOME: Prior to Admission medications   Medication Sig Start Date End Date Taking? Authorizing Provider  atorvastatin (LIPITOR) 20 MG tablet Take 1 tablet (20 mg total) by mouth daily. 10/19/23  Yes Claiborne Rigg, NP  Blood Glucose Monitoring Suppl (TRUE METRIX METER) w/Device KIT Use twice daily 02/19/19  Yes Storm Frisk, MD  Dulaglutide (TRULICITY) 1.5 MG/0.5ML SOAJ Inject 1.5 mg into the skin once a week. 11/10/23  Yes Hoy Register, MD  gabapentin (NEURONTIN) 100 MG capsule Take 1 capsule (100 mg total) by mouth at bedtime. 05/09/23  Yes Claiborne Rigg, NP  glucose blood (TRUE METRIX BLOOD GLUCOSE TEST) test strip Use as instructed. Check  blood glucose level by fingerstick twice per day. 03/24/20  Yes Claiborne Rigg, NP  lisinopril (ZESTRIL) 2.5 MG tablet Take 1 tablet (2.5 mg total) by mouth daily. 05/09/23  Yes Claiborne Rigg, NP  methimazole (TAPAZOLE) 5 MG tablet Take 1 tablet (5 mg total) by mouth daily. 05/24/23  Yes Carlus Pavlov, MD  methocarbamol (ROBAXIN) 500 MG tablet Take 1 tablet (500 mg total) by mouth every 8 (eight) hours as needed for muscle spasms. 09/12/23  Yes Claiborne Rigg, NP  Methylsulfonylmethane (MSM PO) Take 2 tablets by mouth daily.   Yes [provider]  metoprolol succinate (TOPROL-XL) 50 MG 24 hr tablet Take 1 tablet (50 mg total) by mouth daily. 03/23/23  Yes Lennette Bihari, MD  Multiple Vitamins-Minerals (ALIVE WOMENS 50+) TABS Take 1 tablet by mouth daily. 03/22/23  Yes Claiborne Rigg, NP  traZODone (DESYREL) 150 MG tablet Take 0.5-1 tablets (75-150 mg total) by mouth at bedtime. 05/09/23  Yes Claiborne Rigg, NP  TRUEplus Lancets 28G MISC Use twice daily for blood glucose check 03/24/20  Yes Claiborne Rigg, NP  apixaban (ELIQUIS) 5 MG TABS tablet Take 1 tablet (5 mg total) by mouth 2 (two) times daily. 11/16/23   Anders Simmonds, PA-C  furosemide (LASIX) 20 MG tablet Take 1 tablet (20 mg total) by mouth daily. 11/16/23   Anders Simmonds,  PA-C  glipiZIDE (GLUCOTROL) 10 MG tablet Take 1 tablet (10 mg total) by mouth 2 (two) times daily before a meal. 11/16/23   Kelcie Currie, Marzella Schlein, PA-C  metFORMIN (GLUCOPHAGE-XR) 500 MG 24 hr tablet Take 4 tablets (2,000 mg total) by mouth at bedtime. 11/16/23   Pihu Basil, Marzella Schlein, PA-C    ROS: Neg HEENT Neg resp Neg cardiac Neg GI Neg GU Neg MS Neg psych Neg neuro  Objective:   Vitals:   11/16/23 1455  BP: 125/76  Pulse: 72  SpO2: 96%  Weight: 251 lb 3.2 oz (113.9 kg)   Exam General appearance : Awake, alert, not in any distress. Speech Clear. Not toxic looking HEENT: Atraumatic and Normocephalic Neck: Supple, no JVD. No cervical  lymphadenopathy.  Chest: Good air entry bilaterally, CTAB.  No rales/rhonchi/wheezing CVS: S1 S2 regular, no murmurs.  Extremities: B/L Lower Ext shows no edema, both legs are warm to touch B feet examined-good color and capillary RF.  There are corns on the 4th and 5th toe of the R foot. No ulcerations Neurology: Awake alert, and oriented X 3, CN II-XII intact, Non focal Skin: No Rash  Data Review Lab Results  Component Value Date   HGBA1C 6.0 11/16/2023   HGBA1C 6.2 (H) 05/09/2023   HGBA1C 6.6 (A) 12/19/2022    Assessment & Plan   1. Type 2 diabetes mellitus with hyperglycemia, without long-term current use of insulin (HCC) (Primary) Blood sugar 76 today in office.  Controlled with A1C of 6.0! - Glucose (CBG) - HgB A1c - Ambulatory referral to Ophthalmology  2. Long-term current use of injectable noninsulin antidiabetic medication Trulicity-needs to pick up Rx  3. Long term current use of oral hypoglycemic drug Glipizide and metformin.    4. Chronic pain of both knees Sees ortho  5. Corn of toe Showed OTC remedies  6. Encounter for breast cancer screening using non-mammogram modality - MM Digital Screening; Future  7. Paroxysmal atrial fibrillation (HCC) Supposed to be getting from cardiology - apixaban (ELIQUIS) 5 MG TABS tablet; Take 1 tablet (5 mg total) by mouth 2 (two) times daily.  Dispense: 60 tablet; Refill: 0  8. Type 2 diabetes mellitus with other specified complication, without long-term current use of insulin (HCC) - glipiZIDE (GLUCOTROL) 10 MG tablet; Take 1 tablet (10 mg total) by mouth 2 (two) times daily before a meal.  Dispense: 180 tablet; Refill: 0 - metFORMIN (GLUCOPHAGE-XR) 500 MG 24 hr tablet; Take 4 tablets (2,000 mg total) by mouth at bedtime.  Dispense: 360 tablet; Refill: 0  9. Chronic diastolic CHF (congestive heart failure) (HCC) - furosemide (LASIX) 20 MG tablet; Take 1 tablet (20 mg total) by mouth daily.  Dispense: 90 tablet; Refill:  0  10. Vaginal discharge - Cervicovaginal ancillary only   Return in about 3 months (around 02/14/2024) for PCP for chronic conditions-[facility].  The patient was given clear instructions to go to ER or return to medical center if symptoms don't improve, worsen or new problems develop. The patient verbalized understanding. The patient was told to call to get lab results if they haven't heard anything in the next week.      Georgian Co, PA-C [facility] [place], [place] [phone]   11/16/2023, 4:38 PM

## 2023-11-16 NOTE — Patient Instructions (Addendum)
Work at a goal of eliminating sugary drinks, candy, desserts, sweets, refined sugars, processed foods, and white carbohydrates.    Drink 64 to 80 ounces water daily  Corns and Calluses Corns are small areas of thickened skin that form on the top, sides, or tip of a toe. Corns have a cone-shaped core with a point that can press on a nerve below. This causes pain. Calluses are areas of thickened skin that can form anywhere on the body, including the hands, fingers, palms, soles of the feet, and heels. Calluses are usually larger than corns. What are the causes? Corns and calluses are caused by rubbing (friction) or pressure, such as from shoes that are too tight or do not fit properly. What increases the risk? Corns are more likely to develop in people who have misshapen toes (toe deformities), such as hammer toes. Calluses can form with friction to any area of the skin. They are more likely to develop in people who: Work with their hands. Wear shoes that fit poorly, are too tight, or are high-heeled. Have toe deformities. What are the signs or symptoms? Symptoms of a corn or callus include: A hard growth on the skin. Pain or tenderness under the skin. Redness and swelling. Increased discomfort while wearing tight-fitting shoes, if your feet are affected. If a corn or callus becomes infected, symptoms may include: Redness and swelling that gets worse. Pain. Fluid, blood, or pus draining from the corn or callus. How is this diagnosed? Corns and calluses may be diagnosed based on your symptoms, your medical history, and a physical exam. How is this treated? Treatment for corns and calluses may include: Removing the cause of the friction or pressure. This may involve: Changing your shoes. Wearing shoe inserts (orthotics) or other protective layers in your shoes, such as a corn pad. Wearing gloves. Applying medicine to the skin (topical medicine) to help soften skin in the hardened,  thickened areas. Removing layers of dead skin with a file to reduce the size of the corn or callus. Removing the corn or callus with a scalpel or laser. Taking antibiotic medicines, if your corn or callus is infected. Having surgery, if a toe deformity is the cause. Follow these instructions at home:  Take over-the-counter and prescription medicines only as told by your health care provider. If you were prescribed an antibiotic medicine, take it as told by your health care provider. Do not stop taking it even if your condition improves. Wear shoes that fit well. Avoid wearing high-heeled shoes and shoes that are too tight or too loose. Wear any padding, protective layers, gloves, or orthotics as told by your health care provider. Soak your hands or feet. Then use a file or pumice stone to soften your corn or callus. Do this as told by your health care provider. Check your corn or callus every day for signs of infection. Contact a health care provider if: Your symptoms do not improve with treatment. You have redness or swelling that gets worse. Your corn or callus becomes painful. You have fluid, blood, or pus coming from your corn or callus. You have new symptoms. Get help right away if: You develop severe pain with redness. Summary Corns are small areas of thickened skin that form on the top, sides, or tip of a toe. These can be painful. Calluses are areas of thickened skin that can form anywhere on the body, including the hands, fingers, palms, and soles of the feet. Calluses are usually larger than corns.  Corns and calluses are caused by rubbing (friction) or pressure, such as from shoes that are too tight or do not fit properly. Treatment may include wearing padding, protective layers, gloves, or orthotics as told by your health care provider. This information is not intended to replace advice given to you by your health care provider. Make sure you discuss any questions you have with  your health care provider. Document Revised: 03/19/2020 Document Reviewed: 03/19/2020 Elsevier Patient Education  2024 [employer].

## 2023-11-20 ENCOUNTER — Ambulatory Visit: Payer: Medicaid Other | Admitting: Internal Medicine

## 2023-11-20 DIAGNOSIS — F4321 Adjustment disorder with depressed mood: Secondary | ICD-10-CM | POA: Insufficient documentation

## 2023-11-20 DIAGNOSIS — Z7189 Other specified counseling: Secondary | ICD-10-CM | POA: Insufficient documentation

## 2023-11-20 NOTE — Progress Notes (Unsigned)
Patient ID: Haley [name], female   DOB: Aug 19, 1966, 57 y.o.   MRN: [id number]   HPI  Haley [name] is a 57 y.o.-year-old female, initially referred by her PCP, Haley Rigg, NP, returning for follow-up for thyrotoxicosis, diagnosed as Graves' disease at last visit and also large goiter.  Last visit 1 year ago.  Interim history: She mentions that shortness of breath improved.  She prev. Had oxygen desaturation.  She continues on BiPAP at night. She continues to have knee pain (osteoarthritis close and difficulty walking. She has been stressed with her mother passing away from a cancer.  Reviewed and addended history: Patient has a history of thyrotoxicosis since 2010. At that time she was hospitalized with pericarditis and Acute heart failure.   After diagnosis, she was started on a medication (? MMI) >> she could not tolerate 2/2 extreme fatigue >> changed to another medication (? PTU).  Tests normalized afterwards and she was taken off the medication.    However, she again started to have symptoms: Weakness, shortness of breath.  The TSH was rechecked and this was low while her free T4 was high.   She was started on methimazole and Toprol.  Due to insomnia,  we increased her metoprolol to the previous dose, but gave her the higher dose at night.  She is treated with: - Methimazole 5 mg 3 times a day  >> 5 mg twice a day >> 5 mg once a day >> 2.5 mg daily (03/2021) >> stopped 09/2021 - Toprol-XL 50 mg daily + 25 mg at night >> 50 mg daily >> 25 mg in a.m. and 50 mg at night >> 25 mg daily  She saw Dr. Gerrit [name] in 05/2021 but declined thyroidectomy at that time.  In 09/2021, I ordered another thyroid ultrasound but she did not have this yet.  In 12/2022, at her visit with PCP, her TSH was undetectable and she was started back on methimazole 5 mg daily.  I was not aware of the results.  I reviewed her TFTs: Lab Results  Component Value Date   TSH 1.73 05/23/2023   TSH  <0.005 (L) 12/19/2022   TSH 1.01 03/31/2022   TSH 1.530 02/15/2022   TSH 1.370 05/27/2021   TSH 6.08 (H) 03/05/2021   Lab Results  Component Value Date   FREET4 0.73 05/23/2023   FREET4 0.91 03/31/2022   FREET4 0.61 03/05/2021   FREET4 1.63 08/28/2020   FREET4 0.75 04/23/2020   FREET4 0.45 (L) 01/24/2020   FREET4 1.33 (H) 12/22/2019   No components found for: "FREET3"   Her TSI antibodies were elevated: Lab Results  Component Value Date   TSI 375 (H) 05/23/2023   TSI 658 (H) 01/24/2020    She also has a large goiter with substernal extension.  Thyroid ultrasound (05/08/2019): Enlarged thyroid, no nodules. Inferior margin of the left thyroid lobe is difficult to visualize due to the substernal extension.  CXR (12/21/2019): Per review of the images, trachea is slightly deviated to the right  CT chest (12/22/2019): Stable, large goiter, extending into the substernal space  CT angio chest (07/11/2022): Mediastinum/Nodes: There is an enlarged thyroid gland with the left lobe extending into the upper mediastinum.  At the time of her diagnosis, she complained of: - weight gain, however, she had weight loss per review of the chart, approximately 7 pounds net in the last month - Heat intolerance-chronic - Poor sleep - Shortness of breath - Leg swelling - Hair loss  She continues to have shortness of breath, possibly also related to deconditioning  but her sleep and hair loss improved.   She has hot flashes and palpitations, which are are chronic.  She sees cardiology (Haley [name]).   Pt denies: - feeling nodules in neck - hoarseness - dysphagia - choking  Pt does have a FH of thyroid ds.: M aunt and M cousin. No FH of thyroid cancer. No h/o radiation tx to head or neck. No herbal supplements. No Biotin use. No recent steroids use.   Patient also has CHF, and also severe OSA-started on CPAP in 12/2019 -felt much better afterwards. She had to stop as her hose broke >> changed to on  BiPAP.  She was previously exercising consistently: Stationary bike, walking, chair aerobics, weightlifting.  Not exercising now.  She has DM2, managed by PCP >> HbA1c levels reviewed: Lab Results  Component Value Date   HGBA1C 6.0 11/16/2023   HGBA1C 6.2 (H) 05/09/2023   HGBA1C 6.6 (A) 12/19/2022   ROS: + see HPI  I reviewed pt's medications, allergies, PMH, social hx, family hx, and changes were documented in the history of present illness. Otherwise, unchanged from my initial visit note.  Past Medical History:  Diagnosis Date   Essential hypertension    Fibroids    Hyperthyroidism    a. 2010, treated w/ tapazole.   Morbid obesity (HCC)    Paroxysmal atrial fibrillation (HCC)    a. 03/2009 - in setting of hyperthyroidism and caffeine intake, converted spontaneously;  b. 08/2016 ED visit for recurrent PAF-->successful DCCV in ED.   Snores    Type II diabetes mellitus (HCC)    Past Surgical History:  Procedure Laterality Date   UTERINE FIBROID SURGERY     Social History   Socioeconomic History   Marital status: Married    Spouse name: Not on file   Number of children: 0   Years of education: Not on file   Highest education level: Not on file  Occupational History   Occupation:  Environmental health practitioner, Scientist, research (medical)  Tobacco Use   Smoking status: Former Smoker, quit in 2018    Packs/day: 1.00    Years: 32.00    Pack years: 32.00    Quit date: 2012    Years since quitting: 9.1   Smokeless tobacco: Never Used   Tobacco comment: patient vapes  Substance and Sexual Activity   Alcohol use: No    Comment: Wine, 2-3 glasses, every 3 weeks   Drug use: No  Social History Narrative   Lives in [place] with husband.  English as a second language teacher.  Also in school @ [employer] for PT technician.     Social Determinants of Health   Financial Resource Strain:    Difficulty of Paying Living Expenses: Not on file  Food Insecurity:    Worried About Programme researcher, broadcasting/film/video in the Last Year: Not on file    [employer] of Food in the Last Year: Not on file  Transportation Needs:    Lack of Transportation (Medical): Not on file   Lack of Transportation (Non-Medical): Not on file  Physical Activity:    Days of Exercise per Week: Not on file   Minutes of Exercise per Session: Not on file  Stress:    Feeling of Stress : Not on file  Social Connections:    Frequency of Communication with [name] and Family: Not on file   Frequency of Social Gatherings with [name] and Family: Not on file   Attends  Religious Services: Not on file   Active Member of Clubs or Organizations: Not on file   Attends Club or Organization Meetings: Not on file   Marital Status: Not on file  Intimate Partner Violence:    Fear of Current or Ex-Partner: Not on file   Emotionally Abused: Not on file   Physically Abused: Not on file   Sexually Abused: Not on file   Current Outpatient Medications on File Prior to Visit  Medication Sig Dispense Refill   apixaban (ELIQUIS) 5 MG TABS tablet Take 1 tablet (5 mg total) by mouth 2 (two) times daily. 60 tablet 0   atorvastatin (LIPITOR) 20 MG tablet Take 1 tablet (20 mg total) by mouth daily. 90 tablet 1   Blood Glucose Monitoring Suppl (TRUE METRIX METER) w/Device KIT Use twice daily 1 kit 0   Dulaglutide (TRULICITY) 1.5 MG/0.5ML SOAJ Inject 1.5 mg into the skin once a week. 2 mL 3   furosemide (LASIX) 20 MG tablet Take 1 tablet (20 mg total) by mouth daily. 90 tablet 0   gabapentin (NEURONTIN) 100 MG capsule Take 1 capsule (100 mg total) by mouth at bedtime. 90 capsule 2   glipiZIDE (GLUCOTROL) 10 MG tablet Take 1 tablet (10 mg total) by mouth 2 (two) times daily before a meal. 180 tablet 0   glucose blood (TRUE METRIX BLOOD GLUCOSE TEST) test strip Use as instructed. Check blood glucose level by fingerstick twice per day. 200 each 12   lisinopril (ZESTRIL) 2.5 MG tablet Take 1 tablet (2.5 mg total) by mouth daily. 90 tablet 1   metFORMIN (GLUCOPHAGE-XR) 500 MG 24 hr tablet Take 4  tablets (2,000 mg total) by mouth at bedtime. 360 tablet 0   methimazole (TAPAZOLE) 5 MG tablet Take 1 tablet (5 mg total) by mouth daily. 90 tablet 3   methocarbamol (ROBAXIN) 500 MG tablet Take 1 tablet (500 mg total) by mouth every 8 (eight) hours as needed for muscle spasms. 60 tablet 3   Methylsulfonylmethane (MSM PO) Take 2 tablets by mouth daily.     metoprolol succinate (TOPROL-XL) 50 MG 24 hr tablet Take 1 tablet (50 mg total) by mouth daily. 90 tablet 3   Multiple Vitamins-Minerals (ALIVE WOMENS 50+) TABS Take 1 tablet by mouth daily. 90 tablet 3   traZODone (DESYREL) 150 MG tablet Take 0.5-1 tablets (75-150 mg total) by mouth at bedtime. 90 tablet 1   TRUEplus Lancets 28G MISC Use twice daily for blood glucose check 200 each 6   No current facility-administered medications on file prior to visit.   No Known Allergies Family History  Problem Relation Age of Onset   Hypertension Mother    Diabetes Mellitus II Sister    Cancer Maternal Grandmother    Breast cancer Neg Hx    PE: LMP  (LMP Unknown)  Wt Readings from Last 3 Encounters:  11/16/23 251 lb 3.2 oz (113.9 kg)  09/12/23 249 lb 6.4 oz (113.1 kg)  05/23/23 256 lb 6.4 oz (116.3 kg)   Constitutional: overweight, in NAD Eyes: EOMI, no exophthalmos, no lid lag, no stare ENT: no thyromegaly, no cervical lymphadenopathy Cardiovascular: RRR, No MRG, no LE edema Respiratory: CTA B Musculoskeletal: no deformities Skin: no rashes Neurological: no tremor with outstretched hands  ASSESSMENT: 1. Thyrotoxicosis  2.  Goiter  PLAN:  1. Patient with history of thyrotoxicosis with initial thyrotoxic symptoms including weight loss, heat intolerance, shortness of breath, weakness.  TSI's were elevated pointing towards a diagnosis of Graves' disease.  She was started on methimazole and we were then able to reduce the dose gradually, and came off completely in 09/2021.  However, we had to restart methimazole 12/2022.  At last visit,  she was taking 5 mg daily, which we continued. -Also, no clear signs of thyrotoxicosis.  She has heat intolerance which is not new.  Before last visit, she had palpitations and significant weight loss (on Trulicity-40 pounds).  However, she had major stress with her husband passing away and since last visit, mother also died. -She was previously on biotin but not anymore -At last visit, her TFTs were normal and TSI's were still elevated, but much improved -At today's visit, we will recheck these and change the methimazole dose accordingly -Will continue  Toprol-XL for now  2.  Goiter -Large, extending in the mediastinum.  On the chest x-ray from 12/2019, the trachea was slightly deviated to the right -No neck compression symptoms but she did mention that she had saliva catching in her throat occasionally also some cough.  Symptoms improved after starting CPAP and then switching to BiPAP.  She still had shortness of breath and fatigue with oxygen desaturation with walking and it was difficult to know whether her symptoms were related to thyroid compression on the trachea or deconditioning.  I did not feel that her breathing could improve after thyroidectomy and this will also correct her thyrotoxicosis and referred her to Dr. Gerrit [name] for thyroidectomy.  She saw him in 05/2021, but at that time, she declined thyroidectomy.  Afterwards, I ordered another ultrasound for her but she did not have this done.  At last visit, we decided to follow her clinically and by ultrasounds but to only get an ultrasound if I feel that her goiter is larger on palpation or she has more neck compression symptoms.  -At today's visit, she mentions that her neck compression symptoms are stable -Plan to get another ultrasound at next visit, but we will continue to follow her expectantly for now  Needs refills.  Carlus Pavlov, MD PhD [facility]

## 2023-11-22 ENCOUNTER — Ambulatory Visit: Payer: Medicaid Other | Admitting: Internal Medicine

## 2023-11-22 NOTE — Progress Notes (Deleted)
Patient ID: Haley [name], female   DOB: 1966-03-12, 57 y.o.   MRN: [id number]  This note was precharted on 11/20/2023.  HPI  Haley [name] is a 57 y.o.-year-old female, initially referred by her PCP, Haley Rigg, NP, returning for follow-up for thyrotoxicosis, diagnosed as Graves' disease at last visit and also large goiter.  Last visit 1 year ago.  Interim history: She previously had shortness of breath and nocturnal oxygen desaturation-improved on BiPAP. She continues to have knee pain due to osteoarthritis.  It is difficult for her to walk. She had significant stress since last visit with her mother passing away.  Before last visit, her husband passed away.  Reviewed and addended history: Patient has a history of thyrotoxicosis since 2010. At that time she was hospitalized with pericarditis and Acute heart failure.   After diagnosis, she was started on a medication (? MMI) >> she could not tolerate 2/2 extreme fatigue >> changed to another medication (? PTU).  Tests normalized afterwards and she was taken off the medication.    However, she again started to have symptoms: Weakness, shortness of breath.  The TSH was rechecked and this was low while her free T4 was high.   She was started on methimazole and Toprol.  Due to insomnia,  we increased her metoprolol to the previous dose, but gave her the higher dose at night.  She is treated with: - Methimazole 5 mg 3 times a day  >> 5 mg twice a day >> 5 mg once a day >> 2.5 mg daily (03/2021) >> stopped 09/2021 - Toprol-XL 50 mg daily + 25 mg at night >> 50 mg daily >> 25 mg in a.m. and 50 mg at night >> 25 mg daily  She saw Dr. Gerrit [name] in 05/2021 but declined thyroidectomy at that time.  In 09/2021, I ordered another thyroid ultrasound but she did not have this yet.  In 12/2022, at her visit with PCP, her TSH was undetectable and she was started back on methimazole 5 mg daily.  TSH normalized in 05/2023.  I reviewed her  TFTs: Lab Results  Component Value Date   TSH 1.73 05/23/2023   TSH <0.005 (L) 12/19/2022   TSH 1.01 03/31/2022   TSH 1.530 02/15/2022   TSH 1.370 05/27/2021   TSH 6.08 (H) 03/05/2021   Lab Results  Component Value Date   FREET4 0.73 05/23/2023   FREET4 0.91 03/31/2022   FREET4 0.61 03/05/2021   FREET4 1.63 08/28/2020   FREET4 0.75 04/23/2020   FREET4 0.45 (L) 01/24/2020   FREET4 1.33 (H) 12/22/2019   No components found for: "FREET3"   Her TSI antibodies were elevated: Lab Results  Component Value Date   TSI 375 (H) 05/23/2023   TSI 658 (H) 01/24/2020    She also has a large goiter with substernal extension.  Thyroid ultrasound (05/08/2019): Enlarged thyroid, no nodules. Inferior margin of the left thyroid lobe is difficult to visualize due to the substernal extension.  CXR (12/21/2019): Per review of the images, trachea is slightly deviated to the right  CT chest (12/22/2019): Stable, large goiter, extending into the substernal space  CT angio chest (07/11/2022): Mediastinum/Nodes: There is an enlarged thyroid gland with the left lobe extending into the upper mediastinum.  At the time of her diagnosis, she complained of: - weight gain, however, she had weight loss per review of the chart, approximately 7 pounds net in the last month - Heat intolerance-chronic - Poor sleep - Shortness  of breath - Leg swelling - Hair loss  She continues to have shortness of breath, possibly also related to deconditioning  but her sleep and hair loss improved.   She has hot flashes and palpitations, which are are chronic.  She sees cardiology (Haley [name]).   Pt denies: - feeling nodules in neck - hoarseness - dysphagia - choking  Pt does have a FH of thyroid ds.: M aunt and M cousin. No FH of thyroid cancer. No h/o radiation tx to head or neck. No herbal supplements. No Biotin use. No recent steroids use.   Patient also has CHF, and also severe OSA-started on CPAP in 12/2019, then  BiPAP -felt much better afterwards.   She was previously exercising consistently: Stationary bike, walking, chair aerobics, weightlifting.  Not exercising now.  She has DM2, managed by PCP >> HbA1c levels reviewed: Lab Results  Component Value Date   HGBA1C 6.0 11/16/2023   HGBA1C 6.2 (H) 05/09/2023   HGBA1C 6.6 (A) 12/19/2022   ROS: + see HPI  I reviewed pt's medications, allergies, PMH, social hx, family hx, and changes were documented in the history of present illness. Otherwise, unchanged from my initial visit note.  Past Medical History:  Diagnosis Date   Essential hypertension    Fibroids    Hyperthyroidism    a. 2010, treated w/ tapazole.   Morbid obesity (HCC)    Paroxysmal atrial fibrillation (HCC)    a. 03/2009 - in setting of hyperthyroidism and caffeine intake, converted spontaneously;  b. 08/2016 ED visit for recurrent PAF-->successful DCCV in ED.   Snores    Type II diabetes mellitus (HCC)    Past Surgical History:  Procedure Laterality Date   UTERINE FIBROID SURGERY     Social History   Socioeconomic History   Marital status: Married    Spouse name: Not on file   Number of children: 0   Years of education: Not on file   Highest education level: Not on file  Occupational History   Occupation:  Environmental health practitioner, Scientist, research (medical)  Tobacco Use   Smoking status: Former Smoker, quit in 2018    Packs/day: 1.00    Years: 32.00    Pack years: 32.00    Quit date: 2012    Years since quitting: 9.1   Smokeless tobacco: Never Used   Tobacco comment: patient vapes  Substance and Sexual Activity   Alcohol use: No    Comment: Wine, 2-3 glasses, every 3 weeks   Drug use: No  Social History Narrative   Lives in [place] with husband.  English as a second language teacher.  Also in school @ [employer] for PT technician.     Social Determinants of Health   Financial Resource Strain:    Difficulty of Paying Living Expenses: Not on file  Food Insecurity:    Worried About Programme researcher, broadcasting/film/video in  the Last Year: Not on file   [employer] of Food in the Last Year: Not on file  Transportation Needs:    Lack of Transportation (Medical): Not on file   Lack of Transportation (Non-Medical): Not on file  Physical Activity:    Days of Exercise per Week: Not on file   Minutes of Exercise per Session: Not on file  Stress:    Feeling of Stress : Not on file  Social Connections:    Frequency of Communication with [name] and Family: Not on file   Frequency of Social Gatherings with [name] and Family: Not on file   Attends Religious  Services: Not on file   Active Member of Clubs or Organizations: Not on file   Attends Banker Meetings: Not on file   Marital Status: Not on file  Intimate Partner Violence:    Fear of Current or Ex-Partner: Not on file   Emotionally Abused: Not on file   Physically Abused: Not on file   Sexually Abused: Not on file   Current Outpatient Medications on File Prior to Visit  Medication Sig Dispense Refill   apixaban (ELIQUIS) 5 MG TABS tablet Take 1 tablet (5 mg total) by mouth 2 (two) times daily. 60 tablet 0   atorvastatin (LIPITOR) 20 MG tablet Take 1 tablet (20 mg total) by mouth daily. 90 tablet 1   Blood Glucose Monitoring Suppl (TRUE METRIX METER) w/Device KIT Use twice daily 1 kit 0   Dulaglutide (TRULICITY) 1.5 MG/0.5ML SOAJ Inject 1.5 mg into the skin once a week. 2 mL 3   furosemide (LASIX) 20 MG tablet Take 1 tablet (20 mg total) by mouth daily. 90 tablet 0   gabapentin (NEURONTIN) 100 MG capsule Take 1 capsule (100 mg total) by mouth at bedtime. 90 capsule 2   glipiZIDE (GLUCOTROL) 10 MG tablet Take 1 tablet (10 mg total) by mouth 2 (two) times daily before a meal. 180 tablet 0   glucose blood (TRUE METRIX BLOOD GLUCOSE TEST) test strip Use as instructed. Check blood glucose level by fingerstick twice per day. 200 each 12   lisinopril (ZESTRIL) 2.5 MG tablet Take 1 tablet (2.5 mg total) by mouth daily. 90 tablet 1   metFORMIN  (GLUCOPHAGE-XR) 500 MG 24 hr tablet Take 4 tablets (2,000 mg total) by mouth at bedtime. 360 tablet 0   methimazole (TAPAZOLE) 5 MG tablet Take 1 tablet (5 mg total) by mouth daily. 90 tablet 3   methocarbamol (ROBAXIN) 500 MG tablet Take 1 tablet (500 mg total) by mouth every 8 (eight) hours as needed for muscle spasms. 60 tablet 3   Methylsulfonylmethane (MSM PO) Take 2 tablets by mouth daily.     metoprolol succinate (TOPROL-XL) 50 MG 24 hr tablet Take 1 tablet (50 mg total) by mouth daily. 90 tablet 3   Multiple Vitamins-Minerals (ALIVE WOMENS 50+) TABS Take 1 tablet by mouth daily. 90 tablet 3   traZODone (DESYREL) 150 MG tablet Take 0.5-1 tablets (75-150 mg total) by mouth at bedtime. 90 tablet 1   TRUEplus Lancets 28G MISC Use twice daily for blood glucose check 200 each 6   No current facility-administered medications on file prior to visit.   No Known Allergies Family History  Problem Relation Age of Onset   Hypertension Mother    Diabetes Mellitus II Sister    Cancer Maternal Grandmother    Breast cancer Neg Hx    PE: LMP  (LMP Unknown)  Wt Readings from Last 3 Encounters:  11/16/23 251 lb 3.2 oz (113.9 kg)  09/12/23 249 lb 6.4 oz (113.1 kg)  05/23/23 256 lb 6.4 oz (116.3 kg)   Constitutional: overweight, in NAD Eyes: EOMI, no exophthalmos, no lid lag, no stare ENT: no thyromegaly, no cervical lymphadenopathy Cardiovascular: RRR, No MRG, no LE edema Respiratory: CTA B Musculoskeletal: no deformities Skin: no rashes Neurological: no tremor with outstretched hands  ASSESSMENT: 1. Thyrotoxicosis  2.  Goiter  PLAN:  1. Patient with thyrotoxicosis, with initial thyrotoxic symptoms including weight loss, heat intolerance, shortness of breath, weakness.  TSI antibodies were elevated, pointing towards a diagnosis of Graves' disease.  She was  started on methimazole and we were then able to reduce the dose gradually until stopping completely 09/2021.  However, we had to  restart methimazole afterwards.  At last visit, she was taking 5 mg daily, which we continued. -Also, no clear signs of thyrotoxicosis.  She has heat intolerance which is not new.  Before last visit, she had palpitations and significant weight loss (on Trulicity-40 pounds).  However, she had major stress with her husband passing away and since last visit, mother also died. -She was previously on biotin but not anymore -At last visit, her TFTs were normal and TSI's were still elevated, but much improved -Today we will check her TFTs and change the methimazole dose accordingly -Will continue Toprol-XL for now  2.  Goiter -Large, extending in the mediastinum.  On the chest x-ray from 12/2019, the trachea was slightly deviated to the right -No neck compression symptoms but she did mention that she had saliva catching in her throat occasionally also some cough.  Symptoms improved after starting CPAP and then switching to BiPAP.  She still had shortness of breath and fatigue with oxygen desaturation with walking and it was difficult to know whether her symptoms were related to thyroid compression on the trachea or deconditioning.  I did not feel that her breathing could improve after thyroidectomy and this will also correct her thyrotoxicosis and referred her to Dr. Gerrit [name] for thyroidectomy.  She saw him in 05/2021, but at that time, she declined thyroidectomy.  Afterwards, I ordered another ultrasound for her but she did not have this done.  At last visit, we decided to follow her clinically and by ultrasounds but to only get an ultrasound if I feel that her goiter is larger on palpation or she has more neck compression symptoms.  -At today's visit, she mentions that her neck compression symptoms are stable -Plan to get another ultrasound at next visit but we will continue to follow her expectantly for now  Needs refills.  Carlus Pavlov, MD PhD [facility]

## 2023-11-27 ENCOUNTER — Other Ambulatory Visit: Payer: Self-pay

## 2023-12-04 ENCOUNTER — Other Ambulatory Visit: Payer: Self-pay

## 2023-12-07 ENCOUNTER — Telehealth: Payer: Self-pay

## 2023-12-07 ENCOUNTER — Ambulatory Visit: Payer: Medicaid Other | Attending: Family Medicine

## 2023-12-07 ENCOUNTER — Other Ambulatory Visit: Payer: Self-pay

## 2023-12-07 DIAGNOSIS — B379 Candidiasis, unspecified: Secondary | ICD-10-CM

## 2023-12-07 NOTE — Telephone Encounter (Signed)
 Copied from CRM [id number]. Topic: General - Other >> Dec 07, 2023  4:27 PM Tobias CROME wrote: Reason for CRM: Eliana from cytology lab at [facility] states they need a reorder to process speciemen. Pt had swab done today however the order from 11/16/2023 and needing a reorder to process specimen tomorrow.

## 2023-12-07 NOTE — Progress Notes (Signed)
 Patient came in to do swab, labs from from last visit.

## 2023-12-08 ENCOUNTER — Other Ambulatory Visit (HOSPITAL_COMMUNITY)
Admission: RE | Admit: 2023-12-08 | Discharge: 2023-12-08 | Disposition: A | Discharge status: Home / Self Care | Payer: Medicaid Other | Source: Ambulatory Visit | Attending: Family Medicine | Admitting: Family Medicine

## 2023-12-08 DIAGNOSIS — B379 Candidiasis, unspecified: Secondary | ICD-10-CM | POA: Diagnosis present

## 2023-12-08 NOTE — Addendum Note (Signed)
 Addended by: Dalene Carrow I on: 12/08/2023 08:54 AM   Modules accepted: Orders

## 2023-12-08 NOTE — Telephone Encounter (Signed)
 Called and spoke with cytology and made them aware that the swab was reordered.

## 2023-12-11 ENCOUNTER — Telehealth: Payer: Self-pay

## 2023-12-11 ENCOUNTER — Institutional Professional Consult (permissible substitution): Payer: Medicaid Other | Admitting: Licensed Clinical Social Worker

## 2023-12-11 LAB — CERVICOVAGINAL ANCILLARY ONLY
Bacterial Vaginitis (gardnerella): NEGATIVE
Candida Glabrata: NEGATIVE
Candida Vaginitis: NEGATIVE
Chlamydia: NEGATIVE
Comment: NEGATIVE
Comment: NEGATIVE
Comment: NEGATIVE
Comment: NEGATIVE
Comment: NEGATIVE
Comment: NORMAL
Neisseria Gonorrhea: NEGATIVE
Trichomonas: NEGATIVE

## 2023-12-11 NOTE — Telephone Encounter (Signed)
 Pt was called and is aware of results, DOB was confirmed.  ?

## 2023-12-11 NOTE — Telephone Encounter (Signed)
 Patient is wanting waygovy

## 2023-12-11 NOTE — Telephone Encounter (Signed)
-----   Message from [place] sent at 12/11/2023  2:10 PM EST ----- Vaginal swab negative for STD(chlamydia, gonorrhea, trichomonas) and neg for bacterial vaginosis and yeast.  Swab was normal.  Thanks, Georgian Co, PA-C

## 2023-12-13 ENCOUNTER — Ambulatory Visit: Payer: Medicaid Other | Admitting: Licensed Clinical Social Worker

## 2023-12-13 NOTE — Telephone Encounter (Signed)
 Called patient and left voicemail.

## 2023-12-19 ENCOUNTER — Ambulatory Visit (HOSPITAL_COMMUNITY): Payer: Medicaid Other | Admitting: Clinical

## 2024-01-04 ENCOUNTER — Other Ambulatory Visit: Payer: Self-pay

## 2024-01-09 ENCOUNTER — Ambulatory Visit (INDEPENDENT_AMBULATORY_CARE_PROVIDER_SITE_OTHER): Payer: Medicaid Other | Admitting: Internal Medicine

## 2024-01-09 ENCOUNTER — Encounter: Payer: Self-pay | Admitting: Internal Medicine

## 2024-01-09 VITALS — BP 122/60 | HR 72 | Ht 67.0 in | Wt 253.2 lb

## 2024-01-09 DIAGNOSIS — E040 Nontoxic diffuse goiter: Secondary | ICD-10-CM | POA: Diagnosis not present

## 2024-01-09 DIAGNOSIS — E0500 Thyrotoxicosis with diffuse goiter without thyrotoxic crisis or storm: Secondary | ICD-10-CM

## 2024-01-09 NOTE — Patient Instructions (Addendum)
Please stop at the lab.  Please continue Methimazole 5 mg daily.  You should have an endocrinology follow-up appointment in 1 year but in 6 months for labs.

## 2024-01-09 NOTE — Progress Notes (Addendum)
 Patient ID: Haley [name], female   DOB: 11-06-1966, 58 y.o.   MRN: [id number]   HPI  Haley [name] is a 58 y.o.-year-old female, initially referred by her PCP, Haley Haze ORN, NP, returning for follow-up for Graves' disease  and also large goiter.  Last visit 7 mo ago.  Interim history: She continues to have knee pain and difficulty walking. No new palpitations (has Afib), heat intolerance (except some hot flushes since menopause), unintentional weight loss.  She did lose 5 pounds since last visit.  Reviewed and addended history: Patient has a history of thyrotoxicosis since 2010. At that time she was hospitalized with pericarditis and Acute heart failure.   After diagnosis, she was started on a medication (? MMI) >> she could not tolerate 2/2 extreme fatigue >> changed to another medication (? PTU).  Tests normalized afterwards and she was taken off the medication.    However, she again started to have symptoms: Weakness, shortness of breath.  The TSH was rechecked and this was low while her free T4 was high.   She was started on methimazole  and Toprol .  Due to insomnia,  we increased her metoprolol  to the previous dose, but gave her the higher dose at night.  She is treated with: - Methimazole  5 mg 3 times a day  >> 5 mg twice a day >> 5 mg once a day >> 2.5 mg daily (03/2021) >> stopped 09/2021 - Toprol -XL 50 mg daily + 25 mg at night >> 50 mg daily >> 25 mg in a.Haley. and 50 mg at night >> 25 mg daily  She saw Haley [name] in 05/2021 but declined thyroidectomy at that time.  In 09/2021, I ordered another thyroid  ultrasound but she did not have this yet.  In 12/2022, at her visit with PCP, her TSH was undetectable and she was started back on methimazole  5 mg daily.   I reviewed her TFTs: Lab Results  Component Value Date   TSH 1.73 05/23/2023   TSH <0.005 (L) 12/19/2022   TSH 1.01 03/31/2022   TSH 1.530 02/15/2022   TSH 1.370 05/27/2021   TSH 6.08 (H) 03/05/2021    Lab Results  Component Value Date   FREET4 0.73 05/23/2023   FREET4 0.91 03/31/2022   FREET4 0.61 03/05/2021   FREET4 1.63 08/28/2020   FREET4 0.75 04/23/2020   FREET4 0.45 (L) 01/24/2020   FREET4 1.33 (H) 12/22/2019   No components found for: FREET3   Her TSI antibodies were elevated: Lab Results  Component Value Date   TSI 375 (H) 05/23/2023   TSI 658 (H) 01/24/2020    She also has a large goiter with substernal extension.  Thyroid  ultrasound (05/08/2019): Enlarged thyroid , no nodules. Inferior margin of the left thyroid  lobe is difficult to visualize due to the substernal extension. CXR (12/21/2019): Per review of the images, trachea is slightly deviated to the right CT angio chest (12/22/2019): Stable, large goiter, extending into the substernal space CT angio chest (07/11/2022): There is an enlarged thyroid  gland with the left lobe extending into the upper mediastinum.  At the time of her diagnosis, she complained of: - weight gain, however, she had weight loss per review of the chart, approximately 7 pounds net in the last month - Heat intolerance-chronic - Poor sleep - Shortness of breath - Leg swelling - Hair loss  She continues to have shortness of breath, possibly also related to deconditioning  but her sleep and hair loss improved.   She has  hot flashes and palpitations, which are are chronic.  She sees cardiology (Haley [name]).   Pt denies: - feeling nodules in neck - hoarseness - dysphagia - choking  Pt does have a FH of thyroid  ds.: Haley [name] and Haley [name]. No FH of thyroid  cancer. No h/o radiation tx to head or neck. No herbal supplements. No Biotin use. No recent steroids use.   Patient also has CHF, and also severe OSA-started on CPAP in 12/2019 -felt much better afterwards. She had to stop as her hose broke >> changed to on BiPAP.  She was previously exercising consistently, but then only occasionally: Stationary bike, walking, chair aerobics, weightlifting.    She has DM2, managed by PCP >> HbA1c levels reviewed: Lab Results  Component Value Date   HGBA1C 6.0 11/16/2023   HGBA1C 6.2 (H) 05/09/2023   HGBA1C 6.6 (A) 12/19/2022  She is on metformin , glipizide , Trulicity .  ROS: + see HPI  I reviewed pt's medications, allergies, PMH, social hx, family hx, and changes were documented in the history of present illness. Otherwise, unchanged from my initial visit note.  Past Medical History:  Diagnosis Date   Essential hypertension    Fibroids    Hyperthyroidism    a. 2010, treated w/ tapazole .   Morbid obesity (HCC)    Paroxysmal atrial fibrillation (HCC)    a. 03/2009 - in setting of hyperthyroidism and caffeine intake, converted spontaneously;  b. 08/2016 ED visit for recurrent PAF-->successful DCCV in ED.   Snores    Type II diabetes mellitus (HCC)    Past Surgical History:  Procedure Laterality Date   UTERINE FIBROID SURGERY     Social History   Socioeconomic History   Marital status: Married    Spouse name: Not on file   Number of children: 0   Years of education: Not on file   Highest education level: Not on file  Occupational History   Occupation:  Environmental health practitioner, scientist, research (medical)  Tobacco Use   Smoking status: Former Smoker, quit in 2018    Packs/day: 1.00    Years: 32.00    Pack years: 32.00    Quit date: 2012    Years since quitting: 9.1   Smokeless tobacco: Never Used   Tobacco comment: patient vapes  Substance and Sexual Activity   Alcohol use: No    Comment: Wine, 2-3 glasses, every 3 weeks   Drug use: No  Social History Narrative   Lives in [place] with husband.  English as a second language teacher.  Also in school @ [employer] for PT technician.     Social Determinants of Health   Financial Resource Strain:    Difficulty of Paying Living Expenses: Not on file  Food Insecurity:    Worried About Programme Researcher, Broadcasting/film/video in the Last Year: Not on file   [employer] of Food in the Last Year: Not on file  Transportation Needs:    Lack of  Transportation (Medical): Not on file   Lack of Transportation (Non-Medical): Not on file  Physical Activity:    Days of Exercise per Week: Not on file   Minutes of Exercise per Session: Not on file  Stress:    Feeling of Stress : Not on file  Social Connections:    Frequency of Communication with Friends and Family: Not on file   Frequency of Social Gatherings with Friends and Family: Not on file   Attends Religious Services: Not on file   Active Member of Clubs or Organizations: Not on file  Attends Banker Meetings: Not on file   Marital Status: Not on file  Intimate Partner Violence:    Fear of Current or Ex-Partner: Not on file   Emotionally Abused: Not on file   Physically Abused: Not on file   Sexually Abused: Not on file   Current Outpatient Medications on File Prior to Visit  Medication Sig Dispense Refill   apixaban  (ELIQUIS ) 5 MG TABS tablet Take 1 tablet (5 mg total) by mouth 2 (two) times daily. 60 tablet 0   atorvastatin  (LIPITOR) 20 MG tablet Take 1 tablet (20 mg total) by mouth daily. 90 tablet 1   Blood Glucose Monitoring Suppl (TRUE METRIX METER) w/Device KIT Use twice daily 1 kit 0   Dulaglutide  (TRULICITY ) 1.5 MG/0.5ML SOAJ Inject 1.5 mg into the skin once a week. 2 mL 3   furosemide  (LASIX ) 20 MG tablet Take 1 tablet (20 mg total) by mouth daily. 90 tablet 0   gabapentin  (NEURONTIN ) 100 MG capsule Take 1 capsule (100 mg total) by mouth at bedtime. 90 capsule 2   glipiZIDE  (GLUCOTROL ) 10 MG tablet Take 1 tablet (10 mg total) by mouth 2 (two) times daily before a meal. 180 tablet 0   glucose blood (TRUE METRIX BLOOD GLUCOSE TEST) test strip Use as instructed. Check blood glucose level by fingerstick twice per day. 200 each 12   lisinopril  (ZESTRIL ) 2.5 MG tablet Take 1 tablet (2.5 mg total) by mouth daily. 90 tablet 1   metFORMIN  (GLUCOPHAGE -XR) 500 MG 24 hr tablet Take 4 tablets (2,000 mg total) by mouth at bedtime. 360 tablet 0   methimazole   (TAPAZOLE ) 5 MG tablet Take 1 tablet (5 mg total) by mouth daily. 90 tablet 3   methocarbamol  (ROBAXIN ) 500 MG tablet Take 1 tablet (500 mg total) by mouth every 8 (eight) hours as needed for muscle spasms. 60 tablet 3   Methylsulfonylmethane (MSM PO) Take 2 tablets by mouth daily.     metoprolol  succinate (TOPROL -XL) 50 MG 24 hr tablet Take 1 tablet (50 mg total) by mouth daily. 90 tablet 3   Multiple Vitamins-Minerals (ALIVE WOMENS 50+) TABS Take 1 tablet by mouth daily. 90 tablet 3   traZODone  (DESYREL ) 150 MG tablet Take 0.5-1 tablets (75-150 mg total) by mouth at bedtime. 90 tablet 1   TRUEplus Lancets 28G MISC Use twice daily for blood glucose check 200 each 6   No current facility-administered medications on file prior to visit.   No Known Allergies Family History  Problem Relation Age of Onset   Hypertension Mother    Diabetes Mellitus II Sister    Cancer Maternal Grandmother    Breast cancer Neg Hx    PE: BP 122/60   Pulse 72   Ht 5' 7 (1.702 Haley)   Wt 253 lb 3.2 oz (114.9 kg)   LMP  (LMP Unknown)   SpO2 90%   BMI 39.66 kg/Haley  Wt Readings from Last 10 Encounters:  01/09/24 253 lb 3.2 oz (114.9 kg)  11/16/23 251 lb 3.2 oz (113.9 kg)  09/12/23 249 lb 6.4 oz (113.1 kg)  05/23/23 256 lb 6.4 oz (116.3 kg)  05/09/23 258 lb (117 kg)  12/19/22 269 lb (122 kg)  11/17/22 277 lb 6.4 oz (125.8 kg)  10/11/22 279 lb 9.6 oz (126.8 kg)  07/10/22 280 lb (127 kg)  05/23/22 290 lb (131.5 kg)   Constitutional: overweight, in NAD Eyes: EOMI, no exophthalmos, no lid lag, no stare ENT: no thyromegaly felt on palpation, no  cervical lymphadenopathy Cardiovascular: RRR, No MRG, no LE edema Respiratory: CTA B Musculoskeletal: no deformities Skin: no rashes Neurological: no tremor with outstretched hands  ASSESSMENT: 1. Thyrotoxicosis  2.  Goiter  PLAN:  1. Patient with history of thyrotoxicosis with initial thyrotoxic symptoms including weight loss, heat intolerance, shortness of  breath, weakness.  TSI's were elevated, confirming Graves' disease.  She was started on methimazole , which we were then able to decrease gradually and stop completely 09/2021.  She was also previously on Toprol -XL 25 mg daily, which was helping her sleep.  However, the TSH returned suppressed, undetectable in 03/2023 and PCP restarted her methimazole  at 5 mg daily.  At last visit, she was still on this dose.  At that time, TFTs were normal and we continued the same dose of methimazole .  Her TSI antibodies were still elevated at that time, but improved. -At today's visit, she feels well, without complaints.  At last visit she had heat intolerance but also palpitations and weight loss (40 pounds since the previous visit on Trulicity ).  She had major stress before last visit with her husband passing away. -At today's visit, she lost 3 more pounds.  She has only occasional palpitations.  She has some hot flashes, which she attributes to menopause. -She was previously on biotin but not anymore, only the amount in multivitamins.  She takes Alive 50+ multivitamins with 33 mcg Biotin - last dose today.  We discussed that this is a small amount of Biotin so we can check her labs today, however, I advised her that from now on, to only take mandatory medications in the day of labs.. -At today's visit, we will recheck TFTs and change the methimazole  dose accordingly -Will continue Toprol  XL for now -Plan to see her back in 6 months  2.  Goiter -Large, extending in the mediastinum.  On the chest x-ray from 12/2019, the trachea was slightly deviated to the right -She does not have significant neck compression symptoms but she had occasional cough and saliva catching in her throat in the past.  Symptoms improved after starting a CPAP and then switching to BiPAP. -She did have shortness of breath and fatigue and oxygen desaturation with walking.  However, it was difficult to know whether her symptoms were related to  thyroid  compression on the trachea or sleep apnea/deconditioning.  I did feel that her breathing would improve after thyroidectomy and this would also correct her thyrotoxicosis and referred her to Haley [name] for thyroidectomy.  She saw him in 05/2021, however, at that time, she did not want to proceed with thyroidectomy. -In 2023, I advised her to get another thyroid  ultrasound to see if the thyroid  gland was larger, but she did not have this and since she did not have new neck compression symptoms, we discussed about not getting another ultrasound unless she has new symptoms -No neck compression symptoms at today's visit-will follow her expectantly for now  Orders Placed This Encounter  Procedures   TSH   T4, free   T3, free   Component     Latest Ref Rng 01/09/2024  TSH     0.40 - 4.50 mIU/L 0.41   T4,Free(Direct)     0.8 - 1.8 ng/dL 1.3   Triiodothyronine,Free,Serum     2.3 - 4.2 pg/mL 3.3   Thyroid  tests are normal.  Continue the same dose of methimazole  for now.  Lela Fendt, MD PhD [facility] Endocrinology

## 2024-01-10 ENCOUNTER — Encounter: Payer: Self-pay | Admitting: Internal Medicine

## 2024-01-10 LAB — T3, FREE: T3, Free: 3.3 pg/mL (ref 2.3–4.2)

## 2024-01-10 LAB — TSH: TSH: 0.41 m[IU]/L (ref 0.40–4.50)

## 2024-01-10 LAB — T4, FREE: Free T4: 1.3 ng/dL (ref 0.8–1.8)

## 2024-01-12 ENCOUNTER — Telehealth: Payer: Self-pay

## 2024-01-12 NOTE — Telephone Encounter (Unsigned)
 Copied from CRM [id number]. Topic: Clinical - Medication Question >> Jan 12, 2024 12:32 PM [name] wrote: Reason for CRM: Patient received a call regarding a prescription refill and would like to know which one it is that needs to be refilled

## 2024-01-12 NOTE — Telephone Encounter (Signed)
 Call placed to patient unable to reach message left on VM.

## 2024-01-16 ENCOUNTER — Ambulatory Visit: Payer: Medicaid Other | Attending: Cardiovascular Disease | Admitting: Cardiovascular Disease

## 2024-01-18 ENCOUNTER — Other Ambulatory Visit: Payer: Self-pay

## 2024-01-25 ENCOUNTER — Other Ambulatory Visit: Payer: Self-pay | Admitting: Physician Assistant

## 2024-01-25 DIAGNOSIS — I480 Paroxysmal atrial fibrillation: Secondary | ICD-10-CM

## 2024-01-26 ENCOUNTER — Other Ambulatory Visit: Payer: Self-pay

## 2024-01-26 MED ORDER — APIXABAN 5 MG PO TABS
5.0000 mg | ORAL_TABLET | Freq: Two times a day (BID) | ORAL | 0 refills | Status: DC
Start: 2024-01-26 — End: 2024-02-14
  Filled 2024-01-26 – 2024-02-08 (×2): qty 60, 30d supply, fill #0

## 2024-02-06 ENCOUNTER — Other Ambulatory Visit: Payer: Self-pay

## 2024-02-08 ENCOUNTER — Other Ambulatory Visit: Payer: Self-pay

## 2024-02-09 ENCOUNTER — Other Ambulatory Visit: Payer: Self-pay

## 2024-02-14 ENCOUNTER — Other Ambulatory Visit: Payer: Self-pay | Admitting: Nurse Practitioner

## 2024-02-14 ENCOUNTER — Other Ambulatory Visit: Payer: Self-pay

## 2024-02-14 ENCOUNTER — Ambulatory Visit: Payer: Medicaid Other | Attending: Nurse Practitioner | Admitting: Nurse Practitioner

## 2024-02-14 ENCOUNTER — Encounter: Payer: Self-pay | Admitting: Nurse Practitioner

## 2024-02-14 VITALS — BP 114/68 | HR 68 | Resp 20 | Ht 67.0 in | Wt 252.6 lb

## 2024-02-14 DIAGNOSIS — Z7985 Long-term (current) use of injectable non-insulin antidiabetic drugs: Secondary | ICD-10-CM

## 2024-02-14 DIAGNOSIS — F17200 Nicotine dependence, unspecified, uncomplicated: Secondary | ICD-10-CM

## 2024-02-14 DIAGNOSIS — N939 Abnormal uterine and vaginal bleeding, unspecified: Secondary | ICD-10-CM | POA: Diagnosis not present

## 2024-02-14 DIAGNOSIS — E559 Vitamin D deficiency, unspecified: Secondary | ICD-10-CM

## 2024-02-14 DIAGNOSIS — I480 Paroxysmal atrial fibrillation: Secondary | ICD-10-CM

## 2024-02-14 DIAGNOSIS — E119 Type 2 diabetes mellitus without complications: Secondary | ICD-10-CM | POA: Diagnosis not present

## 2024-02-14 DIAGNOSIS — I5032 Chronic diastolic (congestive) heart failure: Secondary | ICD-10-CM

## 2024-02-14 DIAGNOSIS — L2089 Other atopic dermatitis: Secondary | ICD-10-CM

## 2024-02-14 MED ORDER — BUPROPION HCL ER (SR) 150 MG PO TB12
ORAL_TABLET | ORAL | 3 refills | Status: DC
  Filled 2024-02-14: qty 60, 31d supply, fill #0
  Filled 2024-03-20: qty 60, 30d supply, fill #1
  Filled 2024-04-19: qty 60, 30d supply, fill #2
  Filled 2024-06-26 (×2): qty 60, 30d supply, fill #3

## 2024-02-14 MED ORDER — TRIAMCINOLONE ACETONIDE 0.1 % EX CREA
1.0000 | TOPICAL_CREAM | Freq: Two times a day (BID) | CUTANEOUS | 0 refills | Status: AC
Start: 2024-02-14 — End: ?
  Filled 2024-02-14: qty 30, 15d supply, fill #0

## 2024-02-14 MED ORDER — GLIPIZIDE 10 MG PO TABS
10.0000 mg | ORAL_TABLET | Freq: Two times a day (BID) | ORAL | 0 refills | Status: DC
  Filled 2024-02-14 – 2024-05-17 (×2): qty 180, 90d supply, fill #0

## 2024-02-14 MED ORDER — METFORMIN HCL ER 500 MG PO TB24
2000.0000 mg | ORAL_TABLET | Freq: Every day | ORAL | 0 refills | Status: DC
  Filled 2024-02-14 – 2024-05-17 (×2): qty 360, 90d supply, fill #0

## 2024-02-14 MED ORDER — APIXABAN 5 MG PO TABS
5.0000 mg | ORAL_TABLET | Freq: Two times a day (BID) | ORAL | 0 refills | Status: DC
  Filled 2024-02-14 – 2024-03-20 (×2): qty 60, 30d supply, fill #0

## 2024-02-14 MED ORDER — GABAPENTIN 100 MG PO CAPS
100.0000 mg | ORAL_CAPSULE | Freq: Every day | ORAL | 2 refills | Status: DC
  Filled 2024-02-14 – 2024-04-19 (×2): qty 90, 90d supply, fill #0

## 2024-02-14 NOTE — Patient Instructions (Addendum)
 Follow up with Dr Tresa Endo As soon as possible Lennette Bihari, MD CARDIOLOGY Address: [address],  [place], [place] [address] Phone: [phone]

## 2024-02-14 NOTE — Progress Notes (Signed)
 Patient has complaints of hair loss, rash on upper left shoulder and neck, bloating, plateau in weight loss

## 2024-02-14 NOTE — Progress Notes (Signed)
 Assessment & Plan:  Haley [name] was seen today for medical management of chronic issues.  Diagnoses and all orders for this visit:  Diabetes mellitus treated with injections of non-insulin medication (HCC) -     Urine Albumin/Creatinine with ratio (send out) [LAB689] -     gabapentin (NEURONTIN) 100 MG capsule; Take 1 capsule (100 mg total) by mouth at bedtime. -     glipiZIDE (GLUCOTROL) 10 MG tablet; Take 1 tablet (10 mg total) by mouth 2 (two) times daily before a meal. -     metFORMIN (GLUCOPHAGE-XR) 500 MG 24 hr tablet; Take 4 tablets (2,000 mg total) by mouth at bedtime. -     CMP14+EGFR -     Ambulatory referral to Ophthalmology -     Ambulatory referral to Ophthalmology -     Hemoglobin A1c Continue blood sugar control as discussed in office today, low carbohydrate diet, and regular physical exercise as tolerated, 150 minutes per week (30 min each day, 5 days per week, or 50 min 3 days per week). Keep blood sugar logs with fasting goal of 90-130 mg/dl, post prandial (after you eat) less than 180.  For Hypoglycemia: BS <60 and Hyperglycemia BS >400; contact the clinic ASAP. Annual eye exams and foot exams are recommended.   Paroxysmal atrial fibrillation  -     apixaban (ELIQUIS) 5 MG TABS tablet; Take 1 tablet (5 mg total) by mouth 2 (two) times daily.  Chronic diastolic CHF Follow up with Cardiology  Abnormal uterine bleeding (AUB) -     US PELVIC COMPLETE WITH TRANSVAGINAL; Future  Other atopic dermatitis -     triamcinolone cream (KENALOG) 0.1 %; Apply 1 Application topically 2 (two) times daily.  Vitamin D deficiency disease -     VITAMIN D 25 Hydroxy (Vit-D Deficiency, Fractures)    Patient has been counseled on age-appropriate routine health concerns for screening and prevention. These are reviewed and up-to-date. Referrals have been placed accordingly. Immunizations are up-to-date or declined.    Subjective:   Chief Complaint  Patient presents with   Medical  Management of Chronic Issues    Haley [name] 58 y.o. female presents to office today for follow up to DM2  She has a past medical history of Essential hypertension, Fibroids, Hyperthyroidism, Morbid obesity, Paroxysmal atrial fibrillation (Followed by Cardiology), Type 2 DM, morbid obesity, Taking metoprolol for PAH and tachycardic palpitations.      Hair Loss Concerned with hair thinning. She has been taking biotin but not consistently and has not noticed any longstanding results.    She had vaginal bleeding a few weeks lasting for a few days. Prior to a few weeks ago it has been years since she had a menstrual cycle. She also feels like there is some type of obstruction on the inside of her right side of abdomen. Notes pain there during sexual intercourse as well.    DM 2 A1c at goal of less than 6.5 with Trulicity 1.5 mg weekly, glipizide 10 mg twice daily and metformin XR 2000 mg at bedtime.  She is taking renal dose ACE. Lab Results  Component Value Date   HGBA1C 6.0 11/16/2023  LDL at goal. Lab Results  Component Value Date   LDLCALC 47 12/16/2022    BP Readings from Last 3 Encounters:  02/14/24 114/68  01/09/24 122/60  11/16/23 125/76     RASH She has an erythematous patchy rash on the left clavicle area and near the left side of her neck.  Associated symptoms: pruritus. Does not endorse pain or burning with rash.    Review of Systems  Constitutional:  Negative for fever, malaise/fatigue and weight loss.  HENT: Negative.  Negative for nosebleeds.   Eyes:  Positive for blurred vision. Negative for double vision and photophobia.  Respiratory: Negative.  Negative for cough and shortness of breath.   Cardiovascular: Negative.  Negative for chest pain, palpitations and leg swelling.  Gastrointestinal: Negative.  Negative for heartburn, nausea and vomiting.  Musculoskeletal: Negative.  Negative for myalgias.  Skin:  Positive for itching and rash.       HAIR THINNING   Neurological: Negative.  Negative for dizziness, focal weakness, seizures and headaches.  Psychiatric/Behavioral: Negative.  Negative for suicidal ideas.     Past Medical History:  Diagnosis Date   Essential hypertension    Fibroids    Hyperthyroidism    a. 2010, treated w/ tapazole.   Morbid obesity (HCC)    Paroxysmal atrial fibrillation (HCC)    a. 03/2009 - in setting of hyperthyroidism and caffeine intake, converted spontaneously;  b. 08/2016 ED visit for recurrent PAF-->successful DCCV in ED.   Snores    Type II diabetes mellitus (HCC)     Past Surgical History:  Procedure Laterality Date   UTERINE FIBROID SURGERY      Family History  Problem Relation Age of Onset   Hypertension Mother    Diabetes Mellitus II Sister    Cancer Maternal Grandmother    Breast cancer Neg Hx     Social History Reviewed with no changes to be made today.   Outpatient Medications Prior to Visit  Medication Sig Dispense Refill   atorvastatin (LIPITOR) 20 MG tablet Take 1 tablet (20 mg total) by mouth daily. 90 tablet 1   Dulaglutide (TRULICITY) 1.5 MG/0.5ML SOAJ Inject 1.5 mg into the skin once a week. 2 mL 3   furosemide (LASIX) 20 MG tablet Take 1 tablet (20 mg total) by mouth daily. 90 tablet 0   lisinopril (ZESTRIL) 2.5 MG tablet Take 1 tablet (2.5 mg total) by mouth daily. 90 tablet 1   methimazole (TAPAZOLE) 5 MG tablet Take 1 tablet (5 mg total) by mouth daily. 90 tablet 3   Methylsulfonylmethane (MSM PO) Take 2 tablets by mouth daily.     metoprolol succinate (TOPROL-XL) 50 MG 24 hr tablet Take 1 tablet (50 mg total) by mouth daily. 90 tablet 3   Multiple Vitamins-Minerals (ALIVE WOMENS 50+) TABS Take 1 tablet by mouth daily. 90 tablet 3   apixaban (ELIQUIS) 5 MG TABS tablet Take 1 tablet (5 mg total) by mouth 2 (two) times daily. 60 tablet 0   gabapentin (NEURONTIN) 100 MG capsule Take 1 capsule (100 mg total) by mouth at bedtime. 90 capsule 2   glipiZIDE (GLUCOTROL) 10 MG tablet  Take 1 tablet (10 mg total) by mouth 2 (two) times daily before a meal. 180 tablet 0   metFORMIN (GLUCOPHAGE-XR) 500 MG 24 hr tablet Take 4 tablets (2,000 mg total) by mouth at bedtime. 360 tablet 0   Blood Glucose Monitoring Suppl (TRUE METRIX METER) w/Device KIT Use twice daily (Patient not taking: Reported on 02/14/2024) 1 kit 0   glucose blood (TRUE METRIX BLOOD GLUCOSE TEST) test strip Use as instructed. Check blood glucose level by fingerstick twice per day. (Patient not taking: Reported on 02/14/2024) 200 each 12   TRUEplus Lancets 28G MISC Use twice daily for blood glucose check (Patient not taking: Reported on 02/14/2024) 200 each 6  methocarbamol (ROBAXIN) 500 MG tablet Take 1 tablet (500 mg total) by mouth every 8 (eight) hours as needed for muscle spasms. (Patient not taking: Reported on 02/14/2024) 60 tablet 3   traZODone (DESYREL) 150 MG tablet Take 0.5-1 tablets (75-150 mg total) by mouth at bedtime. (Patient not taking: Reported on 02/14/2024) 90 tablet 1   No facility-administered medications prior to visit.    No Known Allergies     Objective:    BP 114/68 (BP Location: Left Arm, Patient Position: Sitting, Cuff Size: Large)   Pulse 68   Resp 20   Ht 5\' 7"  (1.702 m)   Wt 252 lb 9.6 oz (114.6 kg)   LMP  (LMP Unknown)   SpO2 100%   BMI 39.56 kg/m  Wt Readings from Last 3 Encounters:  02/14/24 252 lb 9.6 oz (114.6 kg)  01/09/24 253 lb 3.2 oz (114.9 kg)  11/16/23 251 lb 3.2 oz (113.9 kg)    Physical Exam Vitals and nursing note reviewed.  Constitutional:      Appearance: She is well-developed.  HENT:     Head: Normocephalic and atraumatic.  Neck:   Cardiovascular:     Rate and Rhythm: Normal rate and regular rhythm.     Heart sounds: Normal heart sounds. No murmur heard.    No friction rub. No gallop.  Pulmonary:     Effort: Pulmonary effort is normal. No tachypnea or respiratory distress.     Breath sounds: Normal breath sounds. No decreased breath sounds,  wheezing, rhonchi or rales.  Chest:     Chest wall: No tenderness.  Abdominal:     General: Bowel sounds are normal.     Palpations: Abdomen is soft.  Musculoskeletal:        General: Normal range of motion.     Cervical back: Normal range of motion.  Skin:    General: Skin is warm and dry.  Neurological:     Mental Status: She is alert and oriented to person, place, and time.     Coordination: Coordination normal.  Psychiatric:        Behavior: Behavior normal. Behavior is cooperative.        Thought Content: Thought content normal.        Judgment: Judgment normal.          Patient has been counseled extensively about nutrition and exercise as well as the importance of adherence with medications and regular follow-up. The patient was given clear instructions to go to ER or return to medical center if symptoms don't improve, worsen or new problems develop. The patient verbalized understanding.   Follow-up: Return in about 3 months (around 05/16/2024).   Claiborne Rigg, FNP-BC [facility] [place], [place] [phone]   02/14/2024, 2:17 PM

## 2024-02-15 ENCOUNTER — Other Ambulatory Visit: Payer: Self-pay

## 2024-02-16 LAB — CMP14+EGFR
ALT: 22 IU/L (ref 0–32)
AST: 23 IU/L (ref 0–40)
Albumin: 4.4 g/dL (ref 3.8–4.9)
Alkaline Phosphatase: 69 IU/L (ref 44–121)
BUN/Creatinine Ratio: 26 — ABNORMAL HIGH (ref 9–23)
BUN: 16 mg/dL (ref 6–24)
Bilirubin Total: 0.4 mg/dL (ref 0.0–1.2)
CO2: 26 mmol/L (ref 20–29)
Calcium: 9.9 mg/dL (ref 8.7–10.2)
Chloride: 102 mmol/L (ref 96–106)
Creatinine, Ser: 0.61 mg/dL (ref 0.57–1.00)
Globulin, Total: 2.8 g/dL (ref 1.5–4.5)
Glucose: 95 mg/dL (ref 70–99)
Potassium: 4.2 mmol/L (ref 3.5–5.2)
Sodium: 142 mmol/L (ref 134–144)
Total Protein: 7.2 g/dL (ref 6.0–8.5)
eGFR: 104 mL/min/{1.73_m2} (ref >59)

## 2024-02-16 LAB — MICROALBUMIN / CREATININE URINE RATIO
Creatinine, Urine: 109.7 mg/dL
Microalb/Creat Ratio: 31 mg/g{creat} — ABNORMAL HIGH (ref 0–29)
Microalbumin, Urine: 33.8 ug/mL

## 2024-02-16 LAB — HEMOGLOBIN A1C
Est. average glucose Bld gHb Est-mCnc: 117 mg/dL
Hgb A1c MFr Bld: 5.7 % — ABNORMAL HIGH (ref 4.8–5.6)

## 2024-02-16 LAB — VITAMIN D 25 HYDROXY (VIT D DEFICIENCY, FRACTURES): Vit D, 25-Hydroxy: 41.5 ng/mL (ref 30.0–100.0)

## 2024-02-18 ENCOUNTER — Encounter: Payer: Self-pay | Admitting: Nurse Practitioner

## 2024-02-23 ENCOUNTER — Ambulatory Visit
Admission: RE | Admit: 2024-02-23 | Discharge: 2024-02-23 | Disposition: A | Discharge status: Home / Self Care | Source: Ambulatory Visit | Attending: Nurse Practitioner | Admitting: Nurse Practitioner

## 2024-02-23 DIAGNOSIS — N939 Abnormal uterine and vaginal bleeding, unspecified: Secondary | ICD-10-CM

## 2024-02-26 ENCOUNTER — Other Ambulatory Visit: Payer: Self-pay | Admitting: Nurse Practitioner

## 2024-02-26 ENCOUNTER — Encounter: Payer: Self-pay | Admitting: Nurse Practitioner

## 2024-02-26 DIAGNOSIS — N950 Postmenopausal bleeding: Secondary | ICD-10-CM

## 2024-02-29 ENCOUNTER — Telehealth: Payer: Self-pay

## 2024-02-29 NOTE — Telephone Encounter (Signed)
 Copied from CRM [id number]. Topic: General - Other >> Feb 28, 2024 12:17 PM Shardie S wrote: Reason for CRM: Patient has concerns regarding her insurance, Medicare.  Callback [phone]

## 2024-03-01 ENCOUNTER — Other Ambulatory Visit: Payer: Self-pay

## 2024-03-09 ENCOUNTER — Ambulatory Visit
Admission: RE | Admit: 2024-03-09 | Discharge: 2024-03-09 | Disposition: A | Discharge status: Home / Self Care | Source: Ambulatory Visit | Attending: Physician Assistant | Admitting: Physician Assistant

## 2024-03-09 DIAGNOSIS — Z1239 Encounter for other screening for malignant neoplasm of breast: Secondary | ICD-10-CM

## 2024-03-13 ENCOUNTER — Other Ambulatory Visit: Payer: Self-pay

## 2024-03-13 ENCOUNTER — Encounter: Payer: Self-pay | Admitting: Obstetrics and Gynecology

## 2024-03-13 ENCOUNTER — Ambulatory Visit: Admitting: Obstetrics and Gynecology

## 2024-03-13 ENCOUNTER — Other Ambulatory Visit (HOSPITAL_COMMUNITY)
Admission: RE | Admit: 2024-03-13 | Discharge: 2024-03-13 | Disposition: A | Discharge status: Home / Self Care | Source: Ambulatory Visit | Attending: Obstetrics and Gynecology | Admitting: Obstetrics and Gynecology

## 2024-03-13 VITALS — BP 129/70 | HR 82 | Ht 67.0 in | Wt 253.4 lb

## 2024-03-13 DIAGNOSIS — N950 Postmenopausal bleeding: Secondary | ICD-10-CM

## 2024-03-13 DIAGNOSIS — Z124 Encounter for screening for malignant neoplasm of cervix: Secondary | ICD-10-CM | POA: Diagnosis not present

## 2024-03-13 NOTE — Progress Notes (Signed)
 Pt presents for post-menopausal bleeding and possible fibroids on right side. Pt states that she feels like something pushing out.

## 2024-03-13 NOTE — Progress Notes (Signed)
 NEW GYNECOLOGY PATIENT Patient name: Haley [name] MRN [id number]  Date of birth: [birth year] Chief Complaint:   No chief complaint on file.     History:  Haley [name] is a 58 y.o. No obstetric history on file. being seen today for PMB.    Discussed the use of AI scribe software for clinical note transcription with the patient, who gave verbal consent to proceed.  History of Present Illness Haley [name] is a 58 year old female who presents with postmenopausal bleeding. She was referred by her primary care physician for evaluation of postmenopausal bleeding.  Approximately two months ago, she experienced a single episode of postmenopausal bleeding lasting about two to three days. The bleeding was light, without clots, and described as 'just a little pink water.' No pain was associated with the episode. She denies any family history of uterine cancer or similar conditions.  She notes a recent change in bowel habits, specifically constipation, but denies changes in urinary habits, blood in the urine, or blood in the stool. However, she recalls noticing bleeding in her stool but is uncertain of origin due to concurrent urination when she noticed the blood.  She is sexually active and describes a sensation on the right side during intercourse, feeling as if 'something is pushing up into here,' but denies it being pelvic pain. She has been using a vaginal moisturizer, possibly Vagisil, to address some dryness she has been experiencing.      Gynecologic History No LMP recorded (lmp unknown). Patient is postmenopausal. Contraception: none Last Pap:     Component Value Date/Time   DIAGPAP  11/14/2019 1400    - Negative for intraepithelial lesion or malignancy (NILM)   HPVHIGH Negative 11/14/2019 1400   ADEQPAP  11/14/2019 1400    Satisfactory for evaluation; transformation zone component PRESENT.   Last Mammogram:  03/09/24 BIRADS 1 Last Colonoscopy:  2020 FIT TEST  NEGATIVE  Obstetric History OB History  No obstetric history on file.    Past Medical History:  Diagnosis Date   Essential hypertension    Fibroids    Hyperthyroidism    a. 2010, treated w/ tapazole.   Morbid obesity (HCC)    Paroxysmal atrial fibrillation (HCC)    a. 03/2009 - in setting of hyperthyroidism and caffeine intake, converted spontaneously;  b. 08/2016 ED visit for recurrent PAF-->successful DCCV in ED.   Snores    Type II diabetes mellitus (HCC)     Past Surgical History:  Procedure Laterality Date   UTERINE FIBROID SURGERY      Current Outpatient Medications on File Prior to Visit  Medication Sig Dispense Refill   apixaban (ELIQUIS) 5 MG TABS tablet Take 1 tablet (5 mg total) by mouth 2 (two) times daily. 60 tablet 0   atorvastatin (LIPITOR) 20 MG tablet Take 1 tablet (20 mg total) by mouth daily. 90 tablet 1   Blood Glucose Monitoring Suppl (TRUE METRIX METER) w/Device KIT Use twice daily (Patient not taking: Reported on 02/14/2024) 1 kit 0   buPROPion (WELLBUTRIN SR) 150 MG 12 hr tablet Take 1 tablet (150 mg total) by mouth daily for 3 days, THEN 1 tablet (150 mg total) 2 (two) times daily for 28 days. 60 tablet 3   Dulaglutide (TRULICITY) 1.5 MG/0.5ML SOAJ Inject 1.5 mg into the skin once a week. 2 mL 3   furosemide (LASIX) 20 MG tablet Take 1 tablet (20 mg total) by mouth daily. 90 tablet 0   gabapentin (NEURONTIN) 100 MG  capsule Take 1 capsule (100 mg total) by mouth at bedtime. 90 capsule 2   glipiZIDE (GLUCOTROL) 10 MG tablet Take 1 tablet (10 mg total) by mouth 2 (two) times daily before a meal. 180 tablet 0   glucose blood (TRUE METRIX BLOOD GLUCOSE TEST) test strip Use as instructed. Check blood glucose level by fingerstick twice per day. (Patient not taking: Reported on 02/14/2024) 200 each 12   lisinopril (ZESTRIL) 2.5 MG tablet Take 1 tablet (2.5 mg total) by mouth daily. 90 tablet 1   metFORMIN (GLUCOPHAGE-XR) 500 MG 24 hr tablet Take 4 tablets (2,000 mg  total) by mouth at bedtime. 360 tablet 0   methimazole (TAPAZOLE) 5 MG tablet Take 1 tablet (5 mg total) by mouth daily. 90 tablet 3   Methylsulfonylmethane (MSM PO) Take 2 tablets by mouth daily.     metoprolol succinate (TOPROL-XL) 50 MG 24 hr tablet Take 1 tablet (50 mg total) by mouth daily. 90 tablet 3   Multiple Vitamins-Minerals (ALIVE WOMENS 50+) TABS Take 1 tablet by mouth daily. 90 tablet 3   triamcinolone cream (KENALOG) 0.1 % Apply 1 Application topically 2 (two) times daily. 30 g 0   TRUEplus Lancets 28G MISC Use twice daily for blood glucose check (Patient not taking: Reported on 02/14/2024) 200 each 6   No current facility-administered medications on file prior to visit.    No Known Allergies  Social History:  reports that she has been smoking cigars. She has never used smokeless tobacco. She reports current alcohol use. She reports current drug use. Drug: Marijuana.  Family History  Problem Relation Age of Onset   Hypertension Mother    Diabetes Mellitus II Sister    Cancer Maternal Grandmother    Breast cancer Neg Hx     The following portions of the patient's history were reviewed and updated as appropriate: allergies, current medications, past family history, past medical history, past social history, past surgical history and problem list.  Review of Systems Pertinent items noted in HPI and remainder of comprehensive ROS otherwise negative.  Physical Exam:  BP 129/70   Pulse 82   Ht 5\' 7"  (1.702 m)   Wt 253 lb 6.4 oz (114.9 kg)   LMP  (LMP Unknown)   BMI 39.69 kg/m  Physical Exam Vitals and nursing note reviewed. Exam conducted with a chaperone present.  Constitutional:      Appearance: Normal appearance.  Pulmonary:     Effort: Pulmonary effort is normal.  Abdominal:     Palpations: Abdomen is soft.  Genitourinary:    General: Normal vulva.     Exam position: Lithotomy position.  Neurological:     Mental Status: She is alert.     Endometrial  Biopsy Procedure  Patient identified, informed consent performed,  indication reviewed, consent signed.  Reviewed risk of perforation, pain, bleeding, insufficient sample, etc were reviewd. Time out was performed.  Urine pregnancy test negative.  Speculum placed in the vagina.  Cervix visualized.  Cleaned with Betadine x 2.  Anterior cervix grasped anteriorly with a single tooth tenaculum after application of hurricane spray and betadine.  Paracervical block was administered. Cervix was gently dilated with disposable dilator after initial attempt to pass pipelle. Endometrial pipelle was used to draw up 1cc of 1% lidocaine, introduced into the cervical os and instilled into the endometrial cavity.  The pipelle was passed twice without difficulty and sample obtained. Tenaculum was removed, good hemostasis noted.  Patient tolerated procedure well.  Patient was  given post-procedure instructions.    Assessment and Plan:   Assessment and Plan Assessment & Plan Postmenopausal bleeding Ultrasound showed endometrial thickening, raising concern for precancerous or cancerous changes in the setting of postmenopausal bleeding as well. Discussed different etiologies of postmenopausal bleeding including atrophic changes and polyps.  - Now s/p uncomplicated endometrial biopsy to evaluate endometrial thickening and bleeding - pap smear collected as part of evaluation and due  Pelvic discomfort during intercourse Sensation during intercourse may be related to a hernia or muscle translation. Uterus not overly enlarged and ovaries normal size on last ultrasound. Further evaluation not necessary at this time. - Monitor symptoms and consider further evaluation if symptoms persist or worsen and pending result of endometrial biopsy  Routine preventative health maintenance measures emphasized. Please refer to After Visit Summary for other counseling recommendations.   Follow-up: No follow-ups on file.       Lorriane Shire, MD Obstetrician & Gynecologist, [facility] [employer], [facility]

## 2024-03-13 NOTE — Patient Instructions (Signed)
 Endometrial Biopsy  An endometrial biopsy is a procedure where a tissue sample is removed from the lining of the uterus. This lining is called the endometrium. The tissue sample is then sent to a lab for testing. You may have this type of biopsy to check for: Cancer. Infection. Growths called polyps. Uterine bleeding that can't be explained. Tell a health care provider about: Any allergies you have. All medicines you're taking including vitamins, herbs, eye drops, creams, and over-the-counter medicines. Any problems you or family members have had with anesthesia. Any bleeding problems you have. Any surgeries you have had. Any medical problems you have. Whether you're pregnant or may be pregnant. What are the risks? Your health care provider will talk with you about risks. These may include: Bleeding. Infection. Allergic reactions to medicines. Damage to the wall of the uterus. This is rare. What happens before the procedure? Keep track of your period. You may need to have this biopsy when you're not having your period. Ask your provider about: Changing or stopping your regular medicines. These include any diabetes medicines or blood thinners you take. Taking medicines such as aspirin and ibuprofen. These medicines can thin your blood. Do not take them unless your provider tells you to. Taking over-the-counter medicines, vitamins, herbs, and supplements. Bring a pad with you. You may need to wear one after the biopsy. Plan to have a responsible adult take you home from the hospital or clinic. You won't be allowed to drive. What happens during the procedure? A tool will be put into your vagina to hold it open. This helps your provider see the cervix. The cervix is the lowest part of the uterus. Your cervix will be cleaned with a solution that kills germs. You will be given anesthesia. This keeps you from feeling pain. It will numb your cervix. A tool called forceps will be used to  hold your cervix steady. A thin tool called a uterine sound will be put through your cervix. It will be used to: Find the length of your uterus. Find where to take the sample from. A soft tube called a catheter will be put into your uterus. The catheter will remove a tissue sample. The tube and tools will be removed. The sample will be sent to a lab for testing. The procedure may vary among providers and hospitals. What happens after the procedure? Your blood pressure, heart rate, breathing rate, and blood oxygen level will be monitored until you leave the hospital or clinic. It's up to you to get the results of your procedure. Ask your provider, or the department that is doing the procedure, when your results will be ready. This information is not intended to replace advice given to you by your health care provider. Make sure you discuss any questions you have with your health care provider. Document Revised: 01/31/2023 Document Reviewed: 01/31/2023 Elsevier Patient Education  2024 [employer].

## 2024-03-14 NOTE — Telephone Encounter (Signed)
 Please get auth for this thank you

## 2024-03-14 NOTE — Telephone Encounter (Signed)
 Nvm she has medicaid

## 2024-03-15 ENCOUNTER — Encounter: Payer: Self-pay | Admitting: Obstetrics and Gynecology

## 2024-03-15 DIAGNOSIS — R110 Nausea: Secondary | ICD-10-CM

## 2024-03-15 DIAGNOSIS — N950 Postmenopausal bleeding: Secondary | ICD-10-CM

## 2024-03-15 LAB — SURGICAL PATHOLOGY

## 2024-03-18 ENCOUNTER — Other Ambulatory Visit: Payer: Self-pay

## 2024-03-18 MED ORDER — NORETHINDRONE ACETATE 5 MG PO TABS
5.0000 mg | ORAL_TABLET | Freq: Every day | ORAL | 2 refills | Status: AC
  Filled 2024-03-18: qty 30, 30d supply, fill #0
  Filled 2024-04-19: qty 30, 30d supply, fill #1
  Filled 2024-06-26 (×2): qty 30, 30d supply, fill #2

## 2024-03-18 MED ORDER — ONDANSETRON HCL 4 MG PO TABS
4.0000 mg | ORAL_TABLET | Freq: Three times a day (TID) | ORAL | 0 refills | Status: DC | PRN
  Filled 2024-03-18: qty 10, 4d supply, fill #0

## 2024-03-20 ENCOUNTER — Other Ambulatory Visit: Payer: Self-pay

## 2024-03-21 LAB — CYTOLOGY - PAP: Comment: NEGATIVE

## 2024-03-27 ENCOUNTER — Telehealth: Payer: Self-pay

## 2024-03-27 NOTE — Telephone Encounter (Signed)
 I reached out to schedule surgery w/ Dr. Elester Grim. Patient will look at her schedule and call me back to schedule on 05/14/24 or 05/30/24.

## 2024-04-10 ENCOUNTER — Telehealth: Payer: Self-pay

## 2024-04-10 NOTE — Telephone Encounter (Signed)
 I reached out to patient to see if she's determined which surgery date works best for her schedule? I left a detailed voicemail advising that Dr. Elester Grim is now booked on 05/14/24 and 05/30/24. The next available surgery date is 06/03/24.

## 2024-04-19 ENCOUNTER — Other Ambulatory Visit: Payer: Self-pay

## 2024-04-19 ENCOUNTER — Other Ambulatory Visit: Payer: Self-pay | Admitting: Cardiovascular Disease

## 2024-04-19 DIAGNOSIS — I480 Paroxysmal atrial fibrillation: Secondary | ICD-10-CM

## 2024-04-19 MED ORDER — APIXABAN 5 MG PO TABS
5.0000 mg | ORAL_TABLET | Freq: Two times a day (BID) | ORAL | 1 refills | Status: AC
  Filled 2024-04-19: qty 60, 30d supply, fill #0
  Filled 2024-06-26 (×2): qty 60, 30d supply, fill #1

## 2024-04-19 NOTE — Telephone Encounter (Signed)
 Prescription refill request for Eliquis  received. Indication:afib Last office visit:upcoming Scr:0.61  3/25 Age: 58 Weight:114.9  kg  Prescription refilled

## 2024-04-25 ENCOUNTER — Other Ambulatory Visit: Payer: Self-pay | Admitting: Family Medicine

## 2024-04-25 ENCOUNTER — Other Ambulatory Visit: Payer: Self-pay

## 2024-04-25 DIAGNOSIS — E1169 Type 2 diabetes mellitus with other specified complication: Secondary | ICD-10-CM

## 2024-04-25 MED ORDER — TRULICITY 1.5 MG/0.5ML ~~LOC~~ SOAJ
1.5000 mg | SUBCUTANEOUS | 0 refills | Status: AC
  Filled 2024-04-25 – 2024-05-17 (×2): qty 2, 28d supply, fill #0

## 2024-05-07 ENCOUNTER — Ambulatory Visit: Attending: Cardiovascular Disease | Admitting: Cardiovascular Disease

## 2024-05-07 ENCOUNTER — Other Ambulatory Visit: Payer: Self-pay

## 2024-05-17 ENCOUNTER — Ambulatory Visit: Attending: Nurse Practitioner | Admitting: Nurse Practitioner

## 2024-05-17 ENCOUNTER — Other Ambulatory Visit: Payer: Self-pay

## 2024-05-17 ENCOUNTER — Other Ambulatory Visit: Payer: Self-pay | Admitting: Nurse Practitioner

## 2024-05-17 ENCOUNTER — Encounter: Payer: Self-pay | Admitting: Nurse Practitioner

## 2024-05-17 VITALS — BP 115/70 | HR 62 | Resp 20 | Ht 67.0 in | Wt 247.6 lb

## 2024-05-17 DIAGNOSIS — I5032 Chronic diastolic (congestive) heart failure: Secondary | ICD-10-CM

## 2024-05-17 DIAGNOSIS — E7800 Pure hypercholesterolemia, unspecified: Secondary | ICD-10-CM

## 2024-05-17 DIAGNOSIS — E119 Type 2 diabetes mellitus without complications: Secondary | ICD-10-CM | POA: Diagnosis not present

## 2024-05-17 DIAGNOSIS — E1169 Type 2 diabetes mellitus with other specified complication: Secondary | ICD-10-CM

## 2024-05-17 DIAGNOSIS — Z7985 Long-term (current) use of injectable non-insulin antidiabetic drugs: Secondary | ICD-10-CM | POA: Diagnosis not present

## 2024-05-17 DIAGNOSIS — L2089 Other atopic dermatitis: Secondary | ICD-10-CM

## 2024-05-17 DIAGNOSIS — G629 Polyneuropathy, unspecified: Secondary | ICD-10-CM | POA: Diagnosis not present

## 2024-05-17 DIAGNOSIS — B351 Tinea unguium: Secondary | ICD-10-CM | POA: Diagnosis not present

## 2024-05-17 MED ORDER — ATORVASTATIN CALCIUM 20 MG PO TABS
20.0000 mg | ORAL_TABLET | Freq: Every day | ORAL | 1 refills | Status: DC
  Filled 2024-05-17: qty 90, 90d supply, fill #0
  Filled 2024-08-13: qty 90, 90d supply, fill #1

## 2024-05-17 MED ORDER — FUROSEMIDE 20 MG PO TABS
20.0000 mg | ORAL_TABLET | Freq: Every day | ORAL | 0 refills | Status: DC
  Filled 2024-05-17: qty 90, 90d supply, fill #0

## 2024-05-17 MED ORDER — TRULICITY 1.5 MG/0.5ML ~~LOC~~ SOAJ
1.5000 mg | SUBCUTANEOUS | 3 refills | Status: AC
  Filled 2024-05-17 – 2024-06-26 (×3): qty 2, 28d supply, fill #0
  Filled 2024-08-13: qty 2, 28d supply, fill #1
  Filled 2024-11-20 (×2): qty 2, 28d supply, fill #2
  Filled 2024-12-12 – 2025-01-08 (×2): qty 2, 28d supply, fill #3

## 2024-05-17 MED ORDER — CICLOPIROX 8 % EX SOLN
Freq: Every day | CUTANEOUS | 1 refills | Status: DC
  Filled 2024-05-17: qty 6.6, 30d supply, fill #0

## 2024-05-17 MED ORDER — GLIPIZIDE 10 MG PO TABS
10.0000 mg | ORAL_TABLET | Freq: Two times a day (BID) | ORAL | 1 refills | Status: AC
  Filled 2024-08-13: qty 180, 90d supply, fill #0
  Filled 2024-11-20 (×2): qty 180, 90d supply, fill #1

## 2024-05-17 MED ORDER — FUROSEMIDE 20 MG PO TABS
20.0000 mg | ORAL_TABLET | Freq: Every day | ORAL | 1 refills | Status: AC
  Filled 2024-08-13: qty 90, 90d supply, fill #0
  Filled 2024-11-20 (×2): qty 90, 90d supply, fill #1

## 2024-05-17 MED ORDER — TRIAMCINOLONE ACETONIDE 0.1 % EX CREA
1.0000 | TOPICAL_CREAM | Freq: Two times a day (BID) | CUTANEOUS | 1 refills | Status: AC
  Filled 2024-05-17: qty 30, 15d supply, fill #0
  Filled 2024-06-26 (×2): qty 30, 15d supply, fill #1

## 2024-05-17 MED ORDER — METFORMIN HCL ER 500 MG PO TB24
2000.0000 mg | ORAL_TABLET | Freq: Every day | ORAL | 1 refills | Status: AC
  Filled 2024-08-13: qty 360, 90d supply, fill #0
  Filled 2024-11-20 (×2): qty 360, 90d supply, fill #1

## 2024-05-17 MED ORDER — GABAPENTIN 100 MG PO CAPS
100.0000 mg | ORAL_CAPSULE | Freq: Three times a day (TID) | ORAL | 1 refills | Status: DC
  Filled 2024-05-17 – 2024-08-13 (×2): qty 90, 30d supply, fill #0
  Filled 2024-11-20 (×2): qty 90, 30d supply, fill #1

## 2024-05-17 MED ORDER — LISINOPRIL 2.5 MG PO TABS
2.5000 mg | ORAL_TABLET | Freq: Every day | ORAL | 1 refills | Status: AC
  Filled 2024-05-17 – 2024-08-13 (×2): qty 90, 90d supply, fill #0
  Filled 2024-11-20 (×2): qty 90, 90d supply, fill #1

## 2024-05-17 NOTE — Progress Notes (Unsigned)
 Assessment & Plan:  Haley [name] was seen today for hypertension.  Diagnoses and all orders for this visit:  Neuropathy -     gabapentin  (NEURONTIN ) 100 MG capsule; Take 1 capsule (100 mg total) by mouth 3 (three) times daily.  Type 2 diabetes mellitus with other specified complication, without long-term current use of insulin  (HCC) -     Dulaglutide  (TRULICITY ) 1.5 MG/0.5ML SOAJ; Inject 1.5 mg into the skin once a week. -     lisinopril  (ZESTRIL ) 2.5 MG tablet; Take 1 tablet (2.5 mg total) by mouth daily.  Chronic diastolic CHF (congestive heart failure) (HCC) -     furosemide  (LASIX ) 20 MG tablet; Take 1 tablet (20 mg total) by mouth daily.  Diabetes mellitus treated with injections of non-insulin  medication (HCC) -     glipiZIDE  (GLUCOTROL ) 10 MG tablet; Take 1 tablet (10 mg total) by mouth 2 (two) times daily before a meal. -     metFORMIN  (GLUCOPHAGE -XR) 500 MG 24 hr tablet; Take 4 tablets (2,000 mg total) by mouth at bedtime. -     gabapentin  (NEURONTIN ) 100 MG capsule; Take 1 capsule (100 mg total) by mouth 3 (three) times daily.  Onychomycosis -     ciclopirox (PENLAC) 8 % solution; Apply topically at bedtime. Apply over nail and surrounding skin. Apply daily over previous coat. After seven (7) days, may remove with alcohol and continue cycle.  Other atopic dermatitis -     triamcinolone  cream (KENALOG ) 0.1 %; Apply 1 Application topically 2 (two) times daily.    Patient has been counseled on age-appropriate routine health concerns for screening and prevention. These are reviewed and up-to-date. Referrals have been placed accordingly. Immunizations are up-to-date or declined.    Subjective:   Chief Complaint  Patient presents with   Hypertension    Haley [name] 58 y.o. female presents to office today    BP Readings from Last 3 Encounters:  05/17/24 115/70  03/13/24 129/70  02/14/24 114/68     HPI  ROS  Past Medical History:  Diagnosis Date   Essential  hypertension    Fibroids    Hyperthyroidism    a. 2010, treated w/ tapazole .   Morbid obesity (HCC)    Paroxysmal atrial fibrillation (HCC)    a. 03/2009 - in setting of hyperthyroidism and caffeine intake, converted spontaneously;  b. 08/2016 ED visit for recurrent PAF-->successful DCCV in ED.   Snores    Type II diabetes mellitus (HCC)     Past Surgical History:  Procedure Laterality Date   UTERINE FIBROID SURGERY      Family History  Problem Relation Age of Onset   Hypertension Mother    Diabetes Mellitus II Sister    Cancer Maternal Grandmother    Breast cancer Neg Hx     Social History Reviewed with no changes to be made today.   Outpatient Medications Prior to Visit  Medication Sig Dispense Refill   apixaban  (ELIQUIS ) 5 MG TABS tablet Take 1 tablet (5 mg total) by mouth 2 (two) times daily. 60 tablet 1   atorvastatin  (LIPITOR) 20 MG tablet Take 1 tablet (20 mg total) by mouth daily. 90 tablet 1   Blood Glucose Monitoring Suppl (TRUE METRIX METER) w/Device KIT Use twice daily (Patient not taking: Reported on 02/14/2024) 1 kit 0   buPROPion  (WELLBUTRIN  SR) 150 MG 12 hr tablet Take 1 tablet (150 mg total) by mouth daily for 3 days, THEN 1 tablet (150 mg total) 2 (two) times daily.  60 tablet 3   Dulaglutide  (TRULICITY ) 1.5 MG/0.5ML SOAJ Inject 1.5 mg into the skin once a week.Must have office visit for refills 2 mL 0   glucose blood (TRUE METRIX BLOOD GLUCOSE TEST) test strip Use as instructed. Check blood glucose level by fingerstick twice per day. (Patient not taking: Reported on 03/13/2024) 200 each 12   methimazole  (TAPAZOLE ) 5 MG tablet Take 1 tablet (5 mg total) by mouth daily. 90 tablet 3   Methylsulfonylmethane (MSM PO) Take 2 tablets by mouth daily.     metoprolol  succinate (TOPROL -XL) 50 MG 24 hr tablet Take 1 tablet (50 mg total) by mouth daily. 90 tablet 3   Multiple Vitamins-Minerals (ALIVE WOMENS 50+) TABS Take 1 tablet by mouth daily. 90 tablet 3   norethindrone   (AYGESTIN ) 5 MG tablet Take 1 tablet (5 mg total) by mouth daily. 30 tablet 2   ondansetron  (ZOFRAN ) 4 MG tablet Take 1 tablet (4 mg total) by mouth every 8 (eight) hours as needed for nausea or vomiting. 10 tablet 0   TRUEplus Lancets 28G MISC Use twice daily for blood glucose check (Patient not taking: Reported on 03/13/2024) 200 each 6   furosemide  (LASIX ) 20 MG tablet Take 1 tablet (20 mg total) by mouth daily. 90 tablet 0   gabapentin  (NEURONTIN ) 100 MG capsule Take 1 capsule (100 mg total) by mouth at bedtime. 90 capsule 2   glipiZIDE  (GLUCOTROL ) 10 MG tablet Take 1 tablet (10 mg total) by mouth 2 (two) times daily before a meal. 180 tablet 0   lisinopril  (ZESTRIL ) 2.5 MG tablet Take 1 tablet (2.5 mg total) by mouth daily. 90 tablet 1   metFORMIN  (GLUCOPHAGE -XR) 500 MG 24 hr tablet Take 4 tablets (2,000 mg total) by mouth at bedtime. 360 tablet 0   triamcinolone  cream (KENALOG ) 0.1 % Apply 1 Application topically 2 (two) times daily. 30 g 0   No facility-administered medications prior to visit.    No Known Allergies     Objective:    BP 115/70 (BP Location: Left Arm, Patient Position: Sitting, Cuff Size: Large)   Pulse 62   Resp 20   Ht 5' 7 (1.702 m)   Wt 247 lb 9.6 oz (112.3 kg)   LMP  (LMP Unknown)   SpO2 96%   BMI 38.78 kg/m  Wt Readings from Last 3 Encounters:  05/17/24 247 lb 9.6 oz (112.3 kg)  03/13/24 253 lb 6.4 oz (114.9 kg)  02/14/24 252 lb 9.6 oz (114.6 kg)    Physical Exam       Patient has been counseled extensively about nutrition and exercise as well as the importance of adherence with medications and regular follow-up. The patient was given clear instructions to go to ER or return to medical center if symptoms don't improve, worsen or new problems develop. The patient verbalized understanding.   Follow-up: No follow-ups on file.   Collins Dean, FNP-BC [facility] [place], [place] [phone]   05/17/2024, 2:52  PM

## 2024-05-20 ENCOUNTER — Other Ambulatory Visit (HOSPITAL_COMMUNITY): Payer: Self-pay

## 2024-06-26 ENCOUNTER — Other Ambulatory Visit (HOSPITAL_COMMUNITY): Payer: Self-pay

## 2024-06-26 ENCOUNTER — Other Ambulatory Visit: Payer: Self-pay

## 2024-06-26 ENCOUNTER — Other Ambulatory Visit (HOSPITAL_BASED_OUTPATIENT_CLINIC_OR_DEPARTMENT_OTHER): Payer: Self-pay

## 2024-07-09 ENCOUNTER — Other Ambulatory Visit: Payer: Self-pay

## 2024-07-09 ENCOUNTER — Other Ambulatory Visit: Payer: Medicaid Other

## 2024-07-09 DIAGNOSIS — E0500 Thyrotoxicosis with diffuse goiter without thyrotoxic crisis or storm: Secondary | ICD-10-CM

## 2024-07-09 LAB — T4, FREE: Free T4: 1.1 ng/dL (ref 0.8–1.8)

## 2024-07-09 LAB — T3, FREE: T3, Free: 3.5 pg/mL (ref 2.3–4.2)

## 2024-07-09 LAB — TSH: TSH: 0.48 m[IU]/L (ref 0.40–4.50)

## 2024-07-10 ENCOUNTER — Telehealth: Payer: Self-pay | Admitting: Cardiology

## 2024-07-10 ENCOUNTER — Ambulatory Visit: Payer: Self-pay | Admitting: Internal Medicine

## 2024-07-10 NOTE — Telephone Encounter (Signed)
 Verified name and DOB. Patient stated chest pain started last week and it is intermittent  Patient denies chest tightness and pressure, jaw pain, shortness of breath, nausea and vomiting, and shoulder and arm pain. When feeling chest pain, it lasts only a few minutes and it's more like an itching sensation.  Patient not on Nitroglycerin .  Patient has an appointment with Dr. Shlomo tomorrow 07/11/24. Advised patient if any of the symptoms above arise, to go to emergency room for evaluation. If not, to keep appointment with Dr. Shlomo and chest pain can be addressed then. Patient verbalized understanding.   Josie RN

## 2024-07-10 NOTE — Telephone Encounter (Signed)
 Pt c/o of Chest Pain: STAT if active (IN THIS MOMENT) CP, including tightness, pressure, jaw pain, shoulder/upper arm/back pain, SOB, nausea, and vomiting.  1. Are you having CP right now (tightness, pressure, or discomfort)?  Yes, mild   2. Are you experiencing any other symptoms (ex. SOB, nausea, vomiting, sweating)?  No   3. How long have you been experiencing CP?  About 1 week.  4. Is your CP continuous or coming and going?  Coming and going   5. Have you taken Nitroglycerin ? No

## 2024-07-11 ENCOUNTER — Ambulatory Visit: Admitting: Cardiology

## 2024-07-11 ENCOUNTER — Other Ambulatory Visit: Payer: Self-pay

## 2024-07-12 ENCOUNTER — Telehealth: Payer: Self-pay | Admitting: Cardiology

## 2024-07-12 ENCOUNTER — Other Ambulatory Visit: Payer: Self-pay

## 2024-07-12 NOTE — Telephone Encounter (Signed)
*  STAT* If patient is at the pharmacy, call can be transferred to refill team.   1. Which medications need to be refilled? (please list name of each medication and dose if known)   metoprolol  succinate (TOPROL -XL) 50 MG 24 hr tablet     2. Would you like to learn more about the convenience, safety, & potential cost savings by using the [facility]? No    3. Are you open to using the [facility] (Type [facility]. ). No   4. Which pharmacy/location (including street and city if local pharmacy) is medication to be sent to? [facility]    5. Do they need a 30 day or 90 day supply? 90 day

## 2024-07-15 ENCOUNTER — Encounter: Payer: Self-pay | Admitting: *Deleted

## 2024-07-15 ENCOUNTER — Other Ambulatory Visit: Payer: Self-pay

## 2024-07-15 ENCOUNTER — Other Ambulatory Visit: Payer: Self-pay | Admitting: Nurse Practitioner

## 2024-07-15 DIAGNOSIS — I10 Essential (primary) hypertension: Secondary | ICD-10-CM

## 2024-07-15 NOTE — Telephone Encounter (Signed)
 Copied from CRM #[id number]. Topic: Clinical - Medication Refill >> Jul 15, 2024 12:39 PM Sasha H wrote: Medication: metoprolol  succinate (TOPROL -XL) 50 MG 24 hr tablet  Has the patient contacted their pharmacy? Yes (Agent: If no, request that the patient contact the pharmacy for the refill. If patient does not wish to contact the pharmacy document the reason why and proceed with request.) (Agent: If yes, when and what did the pharmacy advise?)  This is the patient's preferred pharmacy:  [facility] [address], [address] [place] [address] Phone: [phone] Fax: [phone]  [facility] [place], [place] - [address] [place] [address] Phone: [phone] Fax: [phone]  Is this the correct pharmacy for this prescription? Yes If no, delete pharmacy and type the correct one.   Has the prescription been filled recently? Yes  Is the patient out of the medication? Yes  Has the patient been seen for an appointment in the last year OR does the patient have an upcoming appointment? Yes  Can we respond through MyChart? Yes  Agent: Please be advised that Rx refills may take up to 3 business days. We ask that you follow-up with your pharmacy.

## 2024-07-16 ENCOUNTER — Ambulatory Visit: Payer: Self-pay

## 2024-07-16 NOTE — Telephone Encounter (Signed)
 FYI Only or Action Required?: Action required by provider: medication refill request.  Patient was last seen in primary care on 05/17/2024 by Theotis Haze ORN, NP.  Called Nurse Triage reporting Chest Pain.  Symptoms began several weeks ago.  Interventions attempted: Nothing.  Symptoms are: unchanged.  Triage Disposition: Go to ED Now (Notify PCP)  Patient/caregiver understands and will follow disposition?: Unsure     Copied from CRM [id number]. Topic: Clinical - Red Word Triage >> Jul 16, 2024  2:53 PM Tiffini S wrote: [facility] that prompted transfer to Nurse Triage:  Patient called stating that she is having chest pains that comes and goes. Reason for Disposition  [1] Chest pain (or angina) comes and goes AND [2] is happening more often (increasing in frequency) or getting worse (increasing in severity)  (Exception: Chest pains that last only a few seconds.)  Answer Assessment - Initial Assessment Questions 1. LOCATION: Where does it hurt?       Center of chest  2. RADIATION: Does the pain go anywhere else? (e.g., into neck, jaw, arms, back)     Into back  3. ONSET: When did the chest pain begin? (Minutes, hours or days)      2-3 weeks  4. PATTERN: Does the pain come and go, or has it been constant since it started?  Does it get worse with exertion?      Come and goes and gets worse with exertion 5. DURATION: How long does it last (e.g., seconds, minutes, hours)     constant 6. SEVERITY: How bad is the pain?  (e.g., Scale 1-10; mild, moderate, or severe)     5/10 7. CARDIAC RISK FACTORS: Do you have any history of heart problems or risk factors for heart disease? (e.g., angina, prior heart attack; diabetes, high blood pressure, high cholesterol, smoker, or strong family history of heart disease)     Dm, htn, smoker 8. PULMONARY RISK FACTORS: Do you have any history of lung disease?  (e.g., blood clots in lung, asthma, emphysema, birth control pills)     *No  Answer* 9. CAUSE: What do you think is causing the chest pain?     Out of metoprolol   10. OTHER SYMPTOMS: Do you have any other symptoms? (e.g., dizziness, nausea, vomiting, sweating, fever, difficulty breathing, cough)       Sob, dizziness  Protocols used: Chest Pain-A-AH

## 2024-07-19 ENCOUNTER — Other Ambulatory Visit: Payer: Self-pay | Admitting: Nurse Practitioner

## 2024-07-19 DIAGNOSIS — I5032 Chronic diastolic (congestive) heart failure: Secondary | ICD-10-CM

## 2024-07-19 NOTE — Telephone Encounter (Signed)
 Please let her know she is being dismissed from her current cardiologist due to no shows. We will be referring her to a different cardiologist at this time.

## 2024-07-22 NOTE — Telephone Encounter (Signed)
 Patient identified by name and date of birth.  Patient aware of response from provider and voiced understanding.

## 2024-08-12 ENCOUNTER — Telehealth: Payer: Self-pay | Admitting: Nurse Practitioner

## 2024-08-12 NOTE — Telephone Encounter (Signed)
 Pt unconfirmed appt 9/9

## 2024-08-13 ENCOUNTER — Other Ambulatory Visit: Payer: Self-pay | Admitting: Nurse Practitioner

## 2024-08-13 ENCOUNTER — Other Ambulatory Visit: Payer: Self-pay

## 2024-08-13 ENCOUNTER — Encounter: Payer: Self-pay | Admitting: Family Medicine

## 2024-08-13 ENCOUNTER — Ambulatory Visit: Attending: Family Medicine | Admitting: Family Medicine

## 2024-08-13 VITALS — BP 163/80 | HR 69 | Temp 98.5°F | Resp 14 | Ht 67.0 in | Wt 240.0 lb

## 2024-08-13 DIAGNOSIS — Z23 Encounter for immunization: Secondary | ICD-10-CM

## 2024-08-13 DIAGNOSIS — E119 Type 2 diabetes mellitus without complications: Secondary | ICD-10-CM | POA: Diagnosis not present

## 2024-08-13 DIAGNOSIS — N950 Postmenopausal bleeding: Secondary | ICD-10-CM

## 2024-08-13 DIAGNOSIS — I10 Essential (primary) hypertension: Secondary | ICD-10-CM

## 2024-08-13 DIAGNOSIS — I480 Paroxysmal atrial fibrillation: Secondary | ICD-10-CM | POA: Diagnosis not present

## 2024-08-13 DIAGNOSIS — N939 Abnormal uterine and vaginal bleeding, unspecified: Secondary | ICD-10-CM

## 2024-08-13 DIAGNOSIS — I5032 Chronic diastolic (congestive) heart failure: Secondary | ICD-10-CM | POA: Diagnosis not present

## 2024-08-13 DIAGNOSIS — F17200 Nicotine dependence, unspecified, uncomplicated: Secondary | ICD-10-CM

## 2024-08-13 DIAGNOSIS — E0590 Thyrotoxicosis, unspecified without thyrotoxic crisis or storm: Secondary | ICD-10-CM

## 2024-08-13 DIAGNOSIS — Z7985 Long-term (current) use of injectable non-insulin antidiabetic drugs: Secondary | ICD-10-CM

## 2024-08-13 MED ORDER — METOPROLOL SUCCINATE ER 50 MG PO TB24
50.0000 mg | ORAL_TABLET | Freq: Every day | ORAL | 0 refills | Status: AC
Start: 2024-08-13 — End: ?
  Filled 2024-08-13: qty 90, 90d supply, fill #0

## 2024-08-13 NOTE — Patient Instructions (Signed)
 VISIT SUMMARY:  Today, you came in for your annual physical exam. We discussed your menstrual irregularities, hypertension, congestive heart failure, diabetes, and medication management. We also reviewed your general health maintenance needs.  YOUR PLAN:  -ABNORMAL UTERINE BLEEDING IN THE SETTING OF ENDOMETRIOSIS: You are experiencing bleeding after a long period without menstruation, which could be due to your endometriosis or another cause. We will contact your gynecologist to discuss your current bleeding and the potential need for surgery. We will provide you with the contact number for your gynecologist.  -ESSENTIAL HYPERTENSION: Your blood pressure is elevated because you have not been taking metoprolol , which is necessary to control your blood pressure and manage your atrial fibrillation. We have prescribed metoprolol  for one month and will repeat your blood pressure measurement before you leave. We will also contact your cardiologist to discuss ongoing management and refills.  -ATRIAL FIBRILLATION WITH CHRONIC HEART FAILURE: You have atrial fibrillation and chronic heart failure, but you are currently in a normal heart rhythm. It is important to continue taking metoprolol  to manage your heart rate and prevent complications. We will ensure you have a continuous supply of Eliquis  and contact your cardiologist to confirm ongoing management and refills. We will also provide you with the contact number for heart care to verify your discharge status and cardiologist assignment.  -TYPE 2 DIABETES MELLITUS: Your diabetes management is ongoing, and you are currently managing it with Trulicity  and your other medications.  -GENERAL HEALTH MAINTENANCE: Given your risk factors, including diabetes, atrial fibrillation, and chronic heart failure, you are due for vaccinations. We will administer the flu shot and pneumonia shot today.  INSTRUCTIONS:  Please follow up with your gynecologist to discuss your  bleeding and the potential need for surgery. Also, contact your cardiologist to discuss medication refills and ongoing management. We will provide you with the contact numbers for heart care and your gynecologist.

## 2024-08-13 NOTE — Progress Notes (Signed)
 Subjective:  Patient ID: Haley [name], female    DOB: 1966/05/05  Age: 58 y.o. MRN: [id number]  CC: Menstrual Problem     Discussed the use of AI scribe software for clinical note transcription with the patient, who gave verbal consent to proceed.  History of Present Illness Haley [name] is a 58 year old female patient of [facility] with type 2 diabetes mellitus, Graves' disease, paroxysmal atrial fibrillation, hypertension and congestive heart failure who presents for medication refills. Last visit with PCP was 3 months ago.  She experiences menstrual irregularities with the return of her period after a prolonged absence. The bleeding is regular and accompanied by abdominal pain. She has a history of heavy bleeding due to endometriosis.  Review of her chart indicates she was followed by GYN with recommendations to undergo surgery and she had said she will contact them to schedule this but is yet to do so.  She has hypertension and congestive heart failure. She is not currently taking metoprolol  due to a lapse in refills. She continues to take Eliquis , atorvastatin , and lisinopril . Her previous cardiologist Dr Burnard retired.  She was scheduled to see Dr. Randine Bihari however notes in the chart indicates she has been discharged from heart care due to multiple no-shows and metoprolol  needs to be refilled by PCP.  She manages diabetes with Trulicity , metformin , glipizide  and has  refills available. She takes furosemide  for fluid management.  She complains gabapentin  causes numbness and tingling in her hands but this is absent when she does not take the medication.  .  She takes bupropion  for smoking cessation and wishes to continue it.    Past Medical History:  Diagnosis Date   Essential hypertension    Fibroids    Hyperthyroidism    a. 2010, treated w/ tapazole .   Morbid obesity (HCC)    Paroxysmal atrial fibrillation (HCC)    a. 03/2009 - in setting of  hyperthyroidism and caffeine intake, converted spontaneously;  b. 08/2016 ED visit for recurrent PAF-->successful DCCV in ED.   Snores    Type II diabetes mellitus (HCC)     Past Surgical History:  Procedure Laterality Date   UTERINE FIBROID SURGERY      Family History  Problem Relation Age of Onset   Hypertension Mother    Diabetes Mellitus II Sister    Cancer Maternal Grandmother    Breast cancer Neg Hx     Social History   Socioeconomic History   Marital status: Married    Spouse name: Not on file   Number of children: Not on file   Years of education: Not on file   Highest education level: Not on file  Occupational History   Occupation: cosmetologist  Tobacco Use   Smoking status: Every Day    Types: Cigars   Smokeless tobacco: Never   Tobacco comments:    patient vapes  Vaping Use   Vaping status: Former  Substance and Sexual Activity   Alcohol use: Yes    Comment: occasional drink.   Drug use: Yes    Types: Marijuana   Sexual activity: Yes  Other Topics Concern   Not on file  Social History Narrative   Lives in [place] with husband.  English as a second language teacher.  Also in school @ [employer] for PT technician.  Does not routinely exercise.   Social Drivers of Corporate investment banker Strain: Not on file  Food Insecurity: No Food Insecurity (10/11/2022)   Hunger Vital Sign  Worried About Programme researcher, broadcasting/film/video in the Last Year: Never true    Ran Out of Food in the Last Year: Never true  Transportation Needs: No Transportation Needs (10/11/2022)   PRAPARE - Administrator, Civil Service (Medical): No    Lack of Transportation (Non-Medical): No  Physical Activity: Not on file  Stress: Not on file  Social Connections: Not on file    No Known Allergies  Outpatient Medications Prior to Visit  Medication Sig Dispense Refill   apixaban  (ELIQUIS ) 5 MG TABS tablet Take 1 tablet (5 mg total) by mouth 2 (two) times daily. 60 tablet 1   atorvastatin  (LIPITOR)  20 MG tablet Take 1 tablet (20 mg total) by mouth daily. 90 tablet 1   Blood Glucose Monitoring Suppl (TRUE METRIX METER) w/Device KIT Use twice daily 1 kit 0   buPROPion  (WELLBUTRIN  SR) 150 MG 12 hr tablet Take 1 tablet (150 mg total) by mouth daily for 3 days, THEN 1 tablet (150 mg total) 2 (two) times daily. 60 tablet 3   ciclopirox  (PENLAC ) 8 % solution Apply topically at bedtime. Apply over nail and surrounding skin. Apply daily over previous coat. After seven (7) days, may remove with alcohol and continue cycle. 6.6 mL 1   Dulaglutide  (TRULICITY ) 1.5 MG/0.5ML SOAJ Inject 1.5 mg into the skin once a week.Must have office visit for refills 2 mL 0   Dulaglutide  (TRULICITY ) 1.5 MG/0.5ML SOAJ Inject 1.5 mg into the skin once a week. 2 mL 3   furosemide  (LASIX ) 20 MG tablet Take 1 tablet (20 mg total) by mouth daily. 90 tablet 1   gabapentin  (NEURONTIN ) 100 MG capsule Take 1 capsule (100 mg total) by mouth 3 (three) times daily. 90 capsule 1   glipiZIDE  (GLUCOTROL ) 10 MG tablet Take 1 tablet (10 mg total) by mouth 2 (two) times daily before a meal. 180 tablet 1   glucose blood (TRUE METRIX BLOOD GLUCOSE TEST) test strip Use as instructed. Check blood glucose level by fingerstick twice per day. 200 each 12   lisinopril  (ZESTRIL ) 2.5 MG tablet Take 1 tablet (2.5 mg total) by mouth daily. 90 tablet 1   metFORMIN  (GLUCOPHAGE -XR) 500 MG 24 hr tablet Take 4 tablets (2,000 mg total) by mouth at bedtime. 360 tablet 1   methimazole  (TAPAZOLE ) 5 MG tablet Take 1 tablet (5 mg total) by mouth daily. 90 tablet 3   Methylsulfonylmethane (MSM PO) Take 2 tablets by mouth daily.     Multiple Vitamins-Minerals (ALIVE WOMENS 50+) TABS Take 1 tablet by mouth daily. 90 tablet 3   norethindrone  (AYGESTIN ) 5 MG tablet Take 1 tablet (5 mg total) by mouth daily. 30 tablet 2   ondansetron  (ZOFRAN ) 4 MG tablet Take 1 tablet (4 mg total) by mouth every 8 (eight) hours as needed for nausea or vomiting. 10 tablet 0    triamcinolone  cream (KENALOG ) 0.1 % Apply 1 Application topically 2 (two) times daily. 30 g 1   TRUEplus Lancets 28G MISC Use twice daily for blood glucose check 200 each 6   metoprolol  succinate (TOPROL -XL) 50 MG 24 hr tablet Take 1 tablet (50 mg total) by mouth daily. 90 tablet 3   No facility-administered medications prior to visit.     ROS Review of Systems  Constitutional:  Negative for activity change and appetite change.  HENT:  Negative for sinus pressure and sore throat.   Respiratory:  Negative for chest tightness, shortness of breath and wheezing.   Cardiovascular:  Negative for chest pain and palpitations.  Gastrointestinal:  Negative for abdominal distention, abdominal pain and constipation.  Genitourinary: Negative.   Musculoskeletal: Negative.   Psychiatric/Behavioral:  Negative for behavioral problems and dysphoric mood.     Objective:  BP (!) 163/80 (BP Location: Right Arm, Patient Position: Sitting, Cuff Size: Normal)   Pulse 69   Temp 98.5 F (36.9 C) (Oral)   Resp 14   Ht 5' 7 (1.702 m)   Wt 240 lb (108.9 kg)   LMP  (LMP Unknown)   SpO2 98%   BMI 37.59 kg/m      08/13/2024    4:40 PM 08/13/2024    4:10 PM 05/17/2024    2:28 PM  BP/Weight  Systolic BP 163 162 115  Diastolic BP 80 76 70  Wt. (Lbs)  240 247.6  BMI  37.59 kg/m2 38.78 kg/m2      Physical Exam Constitutional:      Appearance: She is well-developed.  Cardiovascular:     Rate and Rhythm: Normal rate.     Heart sounds: Normal heart sounds. No murmur heard. Pulmonary:     Effort: Pulmonary effort is normal.     Breath sounds: Normal breath sounds. No wheezing or rales.  Chest:     Chest wall: No tenderness.  Abdominal:     General: Bowel sounds are normal. There is no distension.     Palpations: Abdomen is soft. There is no mass.     Tenderness: There is no abdominal tenderness.  Musculoskeletal:        General: Normal range of motion.     Right lower leg: No edema.     Left  lower leg: No edema.  Neurological:     Mental Status: She is alert and oriented to person, place, and time.  Psychiatric:        Mood and Affect: Mood normal.        Latest Ref Rng & Units 02/14/2024    2:27 PM 09/12/2023   10:11 AM 05/09/2023   12:20 PM  CMP  Glucose 70 - 99 mg/dL 95  892  79   BUN 6 - 24 mg/dL 16  10  10    Creatinine 0.57 - 1.00 mg/dL 9.38  9.42  9.48   Sodium 134 - 144 mmol/L 142  146  144   Potassium 3.5 - 5.2 mmol/L 4.2  4.6  4.1   Chloride 96 - 106 mmol/L 102  105  105   CO2 20 - 29 mmol/L 26  25  25    Calcium  8.7 - 10.2 mg/dL 9.9  89.9  9.8   Total Protein 6.0 - 8.5 g/dL 7.2  7.5  7.2   Total Bilirubin 0.0 - 1.2 mg/dL 0.4  0.7  0.5   Alkaline Phos 44 - 121 IU/L 69  79  64   AST 0 - 40 IU/L 23  17  17    ALT 0 - 32 IU/L 22  12  15      Lipid Panel     Component Value Date/Time   CHOL 101 12/16/2022 1205   TRIG 65 12/16/2022 1205   HDL 40 12/16/2022 1205   CHOLHDL 2.5 12/16/2022 1205   CHOLHDL 3.5 01/25/2019 0850   VLDL 19 01/25/2019 0850   LDLCALC 47 12/16/2022 1205    CBC    Component Value Date/Time   WBC 8.0 09/12/2023 1011   WBC 8.0 07/10/2022 2155   RBC 5.07 09/12/2023 1011   RBC 4.41 07/10/2022  2155   HGB 15.9 09/12/2023 1011   HCT 49.0 (H) 09/12/2023 1011   PLT 239 09/12/2023 1011   MCV 97 09/12/2023 1011   MCH 31.4 09/12/2023 1011   MCH 30.4 07/10/2022 2155   MCHC 32.4 09/12/2023 1011   MCHC 31.5 07/10/2022 2155   RDW 13.1 09/12/2023 1011   LYMPHSABS 3.0 09/12/2023 1011   MONOABS 0.8 07/10/2022 2155   EOSABS 0.3 09/12/2023 1011   BASOSABS 0.1 09/12/2023 1011    Lab Results  Component Value Date   HGBA1C 5.7 (H) 02/14/2024       Assessment & Plan Abnormal uterine bleeding in the setting of endometriosis Experiencing bleeding after prolonged amenorrhea. Uncertain if endometriosis is worsening or if another cause is present. -Per notes in her chart from 03/2024 GYN had contacted her to schedule surgery but she said she  will call them back to schedule - Contact GYN to discuss current bleeding and potential need for surgery. - Provide contact number for GYN.  Essential hypertension Blood pressure elevated due to not taking metoprolol . Metoprolol  necessary to control blood pressure and manage atrial fibrillation. - Prescribe metoprolol  - Repeat blood pressure measurement before leaving. - Counseled on blood pressure goal of less than 130/80, low-sodium, DASH diet, medication compliance, 150 minutes of moderate intensity exercise per week. Discussed medication compliance, adverse effects.   Atrial fibrillation with chronic heart failure Currently in sinus rhythm. Metoprolol  necessary to manage heart rate and prevent complications. Unfortunately due to her no-show she was discharged by cardiology - New referral for cardiologist placed -Refilled metoprolol   Type 2 diabetes mellitus -Controlled with A1c of 5.7 Diabetes management ongoing. - She does have refills of her medications at the pharmacy - Complains of gabapentin  causing numbness in extremities hence have advised her to discontinue.   General Health Maintenance Due for vaccinations given risk factors including diabetes, atrial fibrillation, and chronic heart failure. - Administer flu shot. - Administer pneumonia shot.  Follow-Up Needs to follow up with GYN and cardiologist to address ongoing health issues and medication management.     Healthcare maintenance Encounter for vaccine administration-flu shot and pneumonia vaccines administered  Meds ordered this encounter  Medications   metoprolol  succinate (TOPROL -XL) 50 MG 24 hr tablet    Sig: Take 1 tablet (50 mg total) by mouth daily.    Dispense:  90 tablet    Refill:  0    Must have office visit for refills per Cardiology    Follow-up: Return in about 3 months (around 11/12/2024) for Medical conditions with PCP.       Corrina Sabin, MD, FAAFP. [facility] [place], [place] [phone]   08/13/2024, 5:07 PM

## 2024-08-14 ENCOUNTER — Ambulatory Visit: Payer: Self-pay | Admitting: Family Medicine

## 2024-08-14 LAB — CMP14+EGFR
ALT: 15 IU/L (ref 0–32)
AST: 17 IU/L (ref 0–40)
Albumin: 4.3 g/dL (ref 3.8–4.9)
Alkaline Phosphatase: 71 IU/L (ref 44–121)
BUN/Creatinine Ratio: 14 (ref 9–23)
BUN: 8 mg/dL (ref 6–24)
Bilirubin Total: 0.6 mg/dL (ref 0.0–1.2)
CO2: 21 mmol/L (ref 20–29)
Calcium: 9.6 mg/dL (ref 8.7–10.2)
Chloride: 106 mmol/L (ref 96–106)
Creatinine, Ser: 0.57 mg/dL (ref 0.57–1.00)
Globulin, Total: 3 g/dL (ref 1.5–4.5)
Glucose: 122 mg/dL — ABNORMAL HIGH (ref 70–99)
Potassium: 3.7 mmol/L (ref 3.5–5.2)
Sodium: 143 mmol/L (ref 134–144)
Total Protein: 7.3 g/dL (ref 6.0–8.5)
eGFR: 105 mL/min/1.73 (ref >59)

## 2024-08-15 ENCOUNTER — Other Ambulatory Visit: Payer: Self-pay

## 2024-08-15 MED ORDER — BUPROPION HCL ER (SR) 150 MG PO TB12
ORAL_TABLET | ORAL | 3 refills | Status: AC
  Filled 2024-08-15: qty 60, 31d supply, fill #0
  Filled 2024-11-20 (×2): qty 60, 30d supply, fill #1
  Filled 2025-01-08: qty 60, 30d supply, fill #2

## 2024-08-15 NOTE — Telephone Encounter (Signed)
 Requested medication (s) are due for refill today: yes  Requested medication (s) are on the active medication list: yes  Last refill:  05/24/23 #90 3 RF  Future visit scheduled: no  Notes to clinic:  last prescribed by Endo: Dr Gherghe//NT not delegated to NT to RF or refuse   Requested Prescriptions  Pending Prescriptions Disp Refills   methimazole  (TAPAZOLE ) 5 MG tablet 90 tablet 3    Sig: Take 1 tablet (5 mg total) by mouth daily.     Not Delegated - Endocrinology:  Hyperthyroid Agents Failed - 08/15/2024  9:30 AM      Failed - This refill cannot be delegated      Passed - TSH in normal range and within 180 days    TSH  Date Value Ref Range Status  07/09/2024 0.48 0.40 - 4.50 mIU/L Final         Passed - T3 Total in normal range and within 180 days    T3, Total  Date Value Ref Range Status  01/24/2019 81 71 - 180 ng/dL Final    Comment:    (NOTE) Performed At: [facility] [address] [place], [place] [id number] Jennette Shorter MD Ey:[id number]    T3, Free  Date Value Ref Range Status  07/09/2024 3.5 2.3 - 4.2 pg/mL Final         Passed - T4 free in normal range and within 180 days    T4, Total  Date Value Ref Range Status  12/19/2022 9.8 4.5 - 12.0 ug/dL Final   Free T4  Date Value Ref Range Status  07/09/2024 1.1 0.8 - 1.8 ng/dL Final         Passed - Valid encounter within last 6 months    Recent Outpatient Visits           2 days ago Chronic diastolic CHF (congestive heart failure) (HCC)   [facility] [place]. [facility] Delbert Clam, MD   3 months ago Diabetes mellitus treated with injections of non-insulin  medication [facility])   [facility] [facility] [place]. [facility] [place], [place] W, NP   6 months ago Diabetes mellitus treated with injections of non-insulin  medication [facility])   [facility] [facility] Dept Of [place]. [facility] [place],  [place] W, NP   9 months ago Type 2 diabetes mellitus with hyperglycemia, without long-term current use of insulin  [facility])   [facility] [facility] [place]. [facility], Jon M, [place]   11 months ago Right flank pain   [facility] [facility] [place]. [facility] Theotis Haze ORN, [place]

## 2024-08-15 NOTE — Telephone Encounter (Signed)
 Requested medication (s) are due for refill today: yes  Requested medication (s) are on the active medication list: yes  Last refill:  02/14/24 and 04/18/24  Future visit scheduled:   Notes to clinic:  Last refilled by ED provider, routing for review     Requested Prescriptions  Pending Prescriptions Disp Refills   apixaban  (ELIQUIS ) 5 MG TABS tablet 60 tablet 1    Sig: Take 1 tablet (5 mg total) by mouth 2 (two) times daily.     Hematology:  Anticoagulants - apixaban  Failed - 08/15/2024  9:29 AM      Failed - HCT in normal range and within 360 days    Hematocrit  Date Value Ref Range Status  09/12/2023 49.0 (H) 34.0 - 46.6 % Final         Passed - PLT in normal range and within 360 days    Platelets  Date Value Ref Range Status  09/12/2023 239 150 - 450 x10E3/uL Final         Passed - HGB in normal range and within 360 days    Hemoglobin  Date Value Ref Range Status  09/12/2023 15.9 11.1 - 15.9 g/dL Final         Passed - Cr in normal range and within 360 days    Creatinine, Ser  Date Value Ref Range Status  08/13/2024 0.57 0.57 - 1.00 mg/dL Final         Passed - AST in normal range and within 360 days    AST  Date Value Ref Range Status  08/13/2024 17 0 - 40 IU/L Final         Passed - ALT in normal range and within 360 days    ALT  Date Value Ref Range Status  08/13/2024 15 0 - 32 IU/L Final         Passed - Valid encounter within last 12 months    Recent Outpatient Visits           2 days ago Chronic diastolic CHF (congestive heart failure) (HCC)   [facility] [facility] [place]. [facility] Delbert Clam, MD   3 months ago Diabetes mellitus treated with injections of non-insulin  medication [facility])   [place] [facility] [place]. [facility] [place], [place] W, NP   6 months ago Diabetes mellitus treated with injections of non-insulin  medication [facility])   [facility] [place] [facility]  Dept Of [place]. [facility] [place], [place] W, NP   9 months ago Type 2 diabetes mellitus with hyperglycemia, without long-term current use of insulin  [facility])   [place] [facility] [place]. [facility], [place] M, [place]   11 months ago Right flank pain   [facility] [place] [facility] [place]. [facility] [place], [place] W, NP               buPROPion  (WELLBUTRIN  SR) 150 MG 12 hr tablet 60 tablet 3    Sig: Take 1 tablet (150 mg total) by mouth daily for 3 days, THEN 1 tablet (150 mg total) 2 (two) times daily.     Psychiatry: Antidepressants - bupropion  Failed - 08/15/2024  9:29 AM      Failed - Last BP in normal range    BP Readings from Last 1 Encounters:  08/13/24 (!) 163/80  Passed - Cr in normal range and within 360 days    Creatinine, Ser  Date Value Ref Range Status  08/13/2024 0.57 0.57 - 1.00 mg/dL Final         Passed - AST in normal range and within 360 days    AST  Date Value Ref Range Status  08/13/2024 17 0 - 40 IU/L Final         Passed - ALT in normal range and within 360 days    ALT  Date Value Ref Range Status  08/13/2024 15 0 - 32 IU/L Final         Passed - Completed PHQ-2 or PHQ-9 in the last 360 days      Passed - Valid encounter within last 6 months    Recent Outpatient Visits           2 days ago Chronic diastolic CHF (congestive heart failure) (HCC)   [facility] [facility] [place]. [facility] Delbert Clam, MD   3 months ago Diabetes mellitus treated with injections of non-insulin  medication [facility] [place] [facility])   [place] [facility] [place]. [facility] [place], [place] W, NP   6 months ago Diabetes mellitus treated with injections of non-insulin  medication [facility])   [place] [facility] [place]. [facility] [place], [place] W, NP   9 months ago Type 2 diabetes mellitus with  hyperglycemia, without long-term current use of insulin  [facility])   [place] [facility] Dept Of [place]. [facility], [place] M, [place]   11 months ago Right flank pain   [facility] [place]. [facility] Theotis Haze [place], [place]

## 2024-08-19 ENCOUNTER — Encounter (HOSPITAL_COMMUNITY): Payer: Self-pay

## 2024-08-19 ENCOUNTER — Other Ambulatory Visit: Payer: Self-pay

## 2024-08-20 ENCOUNTER — Encounter: Payer: Self-pay | Admitting: Obstetrics and Gynecology

## 2024-09-20 ENCOUNTER — Encounter (HOSPITAL_COMMUNITY): Payer: Self-pay

## 2024-09-20 ENCOUNTER — Other Ambulatory Visit: Payer: Self-pay

## 2024-09-20 ENCOUNTER — Emergency Department (HOSPITAL_COMMUNITY)

## 2024-09-20 ENCOUNTER — Emergency Department (HOSPITAL_COMMUNITY)
Admission: EM | Admit: 2024-09-20 | Discharge: 2024-09-21 | Disposition: A | Discharge status: Home / Self Care | Attending: Emergency Medicine | Admitting: Emergency Medicine

## 2024-09-20 DIAGNOSIS — Z79899 Other long term (current) drug therapy: Secondary | ICD-10-CM | POA: Diagnosis not present

## 2024-09-20 DIAGNOSIS — Z7984 Long term (current) use of oral hypoglycemic drugs: Secondary | ICD-10-CM | POA: Diagnosis not present

## 2024-09-20 DIAGNOSIS — S7012XA Contusion of left thigh, initial encounter: Secondary | ICD-10-CM | POA: Diagnosis not present

## 2024-09-20 DIAGNOSIS — I5032 Chronic diastolic (congestive) heart failure: Secondary | ICD-10-CM | POA: Diagnosis not present

## 2024-09-20 DIAGNOSIS — M542 Cervicalgia: Secondary | ICD-10-CM | POA: Diagnosis not present

## 2024-09-20 DIAGNOSIS — S7011XA Contusion of right thigh, initial encounter: Secondary | ICD-10-CM | POA: Insufficient documentation

## 2024-09-20 DIAGNOSIS — M79651 Pain in right thigh: Secondary | ICD-10-CM | POA: Diagnosis present

## 2024-09-20 DIAGNOSIS — I110 Hypertensive heart disease with heart failure: Secondary | ICD-10-CM | POA: Diagnosis not present

## 2024-09-20 DIAGNOSIS — Y92410 Unspecified street and highway as the place of occurrence of the external cause: Secondary | ICD-10-CM | POA: Diagnosis not present

## 2024-09-20 DIAGNOSIS — E119 Type 2 diabetes mellitus without complications: Secondary | ICD-10-CM | POA: Insufficient documentation

## 2024-09-20 DIAGNOSIS — I480 Paroxysmal atrial fibrillation: Secondary | ICD-10-CM | POA: Insufficient documentation

## 2024-09-20 DIAGNOSIS — Z7901 Long term (current) use of anticoagulants: Secondary | ICD-10-CM | POA: Diagnosis not present

## 2024-09-20 DIAGNOSIS — S8991XA Unspecified injury of right lower leg, initial encounter: Secondary | ICD-10-CM | POA: Diagnosis not present

## 2024-09-20 DIAGNOSIS — T07XXXA Unspecified multiple injuries, initial encounter: Secondary | ICD-10-CM
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
## 2024-10-01 ENCOUNTER — Telehealth: Payer: Self-pay | Admitting: Nurse Practitioner

## 2024-10-01 NOTE — Telephone Encounter (Signed)
 Copied from CRM [id number]. Topic: Appointments - Scheduling Inquiry for Clinic >> Sep 30, 2024 11:00 AM Rosaria BRAVO wrote:  Reason for CRM: pt called requesting to be seen sooner than what is available. Seeking appt for sore throat, needs medicine refill, also follow up from ED visit.   Best contact: [phone]   ----------------------------------------------------------------------- From previous Reason for Contact - Scheduling: Patient/patient representative is calling to schedule an appointment. Refer to attachments for appointment information.

## 2024-10-01 NOTE — Telephone Encounter (Signed)
 Called and advised patient to visit the mobile clinic for an earlier appointment, as the PCP has no available openings. Patient acknowledged and will go on Wednesday. Provided the clinic address and timings for Wednesday.    Timings: 9 am 6:30 pm Address: [facility]  [address], [place] [address]

## 2024-10-02 ENCOUNTER — Telehealth: Admitting: Physician Assistant

## 2024-10-02 ENCOUNTER — Other Ambulatory Visit: Payer: Self-pay

## 2024-10-02 ENCOUNTER — Encounter: Payer: Self-pay | Admitting: Physician Assistant

## 2024-10-02 VITALS — BP 151/70 | HR 66 | Ht 67.0 in | Wt 241.0 lb

## 2024-10-02 DIAGNOSIS — J302 Other seasonal allergic rhinitis: Secondary | ICD-10-CM | POA: Diagnosis not present

## 2024-10-02 DIAGNOSIS — E119 Type 2 diabetes mellitus without complications: Secondary | ICD-10-CM

## 2024-10-02 DIAGNOSIS — J3089 Other allergic rhinitis: Secondary | ICD-10-CM

## 2024-10-02 DIAGNOSIS — E1169 Type 2 diabetes mellitus with other specified complication: Secondary | ICD-10-CM

## 2024-10-02 LAB — POCT GLYCOSYLATED HEMOGLOBIN (HGB A1C)
HbA1c POC (<> result, manual entry): 5.8 % (ref 4.0–5.6)
HbA1c, POC (controlled diabetic range): 5.8 % (ref 0.0–7.0)
HbA1c, POC (prediabetic range): 5.8 % (ref 5.7–6.4)
Hemoglobin A1C: 5.8 % — AB (ref 4.0–5.6)

## 2024-10-02 LAB — POCT GLUCOSE FINGERSTICK: Glucose: 70 (ref 70–99)

## 2024-10-02 MED ORDER — CETIRIZINE HCL 10 MG PO TABS
10.0000 mg | ORAL_TABLET | Freq: Every day | ORAL | 11 refills | Status: DC
  Filled 2024-10-02: qty 30, 30d supply, fill #0

## 2024-10-02 MED ORDER — FLUTICASONE PROPIONATE 50 MCG/ACT NA SUSP
2.0000 | Freq: Every day | NASAL | 6 refills | Status: DC
  Filled 2024-10-02: qty 16, 30d supply, fill #0

## 2024-10-02 NOTE — Patient Instructions (Signed)
 You are going to use zyrtec once daily and start using flonase once daily as well.  I encourage you to purchase saline nasal spray over the counter and use that as directed.   Make sure that you are getting plenty of rest and staying well hydrated.  I hope that you feel better soon  Please let us  know if there is anything else we can do for you  Haley [name] Sage, PA-C Physician Assistant [facility] [email or web address]  Allergic Rhinitis, Adult  Allergic rhinitis is an allergic reaction that affects the mucous membrane inside the nose. The mucous membrane is the tissue that produces mucus. There are two types of allergic rhinitis: Seasonal. This type is also called hay fever and happens only during certain seasons. Perennial. This type can happen at any time of the year. Allergic rhinitis cannot be spread from person to person. This condition can be mild, bad, or very bad. It can develop at any age and may be outgrown. What are the causes? This condition is caused by allergens. These are things that can cause an allergic reaction. Allergens may differ for seasonal allergic rhinitis and perennial allergic rhinitis. Seasonal allergic rhinitis is caused by pollen. Pollen can come from grasses, trees, and weeds. Perennial allergic rhinitis may be caused by: Dust mites. Proteins in a pet's pee (urine), saliva, or dander. Dander is dead skin cells from a pet. Smoke, mold, or car fumes. Remains of or waste from insects such as cockroaches. What increases the risk? You are more likely to develop this condition if you have a family history of allergies or other conditions related to allergies, including: Allergic conjunctivitis. This is irritation and swelling of parts of the eyes and eyelids. Asthma. This condition affects the lungs and makes it hard to breathe. Atopic dermatitis or eczema. This is long term (chronic) irritation and swelling  of the skin. Food allergies. What are the signs or symptoms? Symptoms of this condition include: Sneezing or coughing. A stuffy nose (nasal congestion), itchy nose, or nasal discharge. Itchy eyes and tearing of the eyes. A feeling of mucus dripping down the back of your throat (postnasal drip). This may cause a sore throat. Trouble sleeping. Tiredness. Headache. How is this diagnosed? This condition may be diagnosed with your symptoms, your medical history, and a physical exam. Your health care provider may check for related conditions, such as: Asthma. Pink eye. This is eye swelling and irritation caused by infection (conjunctivitis). Ear infection. Upper respiratory infection. This is an infection in the nose, throat, or upper airways. You may also have tests to find out which allergens cause your symptoms. These may include skin tests or blood tests. How is this treated? There is no cure for this condition, but treatment can help control symptoms. Treatment may include: Taking medicines that block allergy symptoms, such as corticosteroids (anti-inflammatories) and antihistamines. Medicine may be given as a shot, nasal spray, or pill. Avoiding any allergens. Being exposed again and again to tiny amounts of allergens to help you build a defense against allergens (allergenimmunotherapy). This is done if other treatments have not helped. It may include: Allergy shots. These are injected medicines that have small amounts of an allergen in them. Sublingual immunotherapy. This involves taking small doses of a medicine with an allergen in it under your tongue. If these treatments do not work, your provider may prescribe newer, stronger medicines. Follow these instructions at home: Avoiding allergens Find out what you are allergic to and avoid  those allergens. These are some things you can do to help avoid allergens: If you have perennial allergies: Replace carpet with wood, tile, or vinyl  flooring. Carpet can trap dander and dust. Do not smoke. Do not allow smoking in your home Change your heating and air conditioning filters at least once a month. If you have seasonal allergies, take these steps during allergy season: Keep windows closed as much as possible. Plan outdoor activities when pollen counts are lowest. Check pollen counts before you plan outdoor activities When coming indoors, change clothing and shower before sitting on furniture or bedding. If you have a pet in the house that produces allergens: Keep the pet out of the bedroom. Vacuum, sweep, and dust regularly. General instructions Take over-the-counter and prescription medicines only as told by your provider. Drink enough fluid to keep your pee pale yellow. Where to find more information American Academy of Allergy, Asthma & Immunology: aaaai.org Contact a health care provider if: You have a fever. You develop a cough that does not go away. You make high-pitched whistling sounds when you breathe, most often when you breathe out (wheeze). Your symptoms slow you down or stop you from doing your normal activities each day. Get help right away if: You have shortness of breath. This symptom may be an emergency. Get help right away. Call 911. Do not wait to see if the symptoms will go away. Do not drive yourself to the hospital. This information is not intended to replace advice given to you by your health care provider. Make sure you discuss any questions you have with your health care provider. Document Revised: 08/01/2022 Document Reviewed: 08/01/2022 Elsevier Patient Education  2024 [employer].
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
## 2024-10-03 ENCOUNTER — Other Ambulatory Visit: Payer: Self-pay

## 2024-10-03 ENCOUNTER — Encounter: Payer: Self-pay | Admitting: Physician Assistant

## 2024-10-07 ENCOUNTER — Encounter: Payer: Self-pay | Admitting: Radiology

## 2024-10-09 ENCOUNTER — Ambulatory Visit: Admitting: Cardiology

## 2024-10-14 ENCOUNTER — Telehealth: Payer: Self-pay

## 2024-10-14 NOTE — Telephone Encounter (Signed)
 Returning pt phone call. Pt states she is having bleeding after intercourse, pt was previously seen in office for vaginal bleeding (see notes). Pt is requesting an rx for moisture advised pt to use lube in the meantime and she can further discuss her concerns at her appt on 11/17. Pt verbalizes understanding.

## 2024-10-18 ENCOUNTER — Telehealth: Payer: Self-pay

## 2024-10-18 NOTE — Telephone Encounter (Signed)
 I called patient back to let her know Dr. Jeralyn is full on 12/03/24 and to see if she's available on 11/18/24 at 2 pm? Patient voicemail is full.

## 2024-10-18 NOTE — Telephone Encounter (Signed)
 I called patient to schedule surgery w/ Dr. Jeralyn on 11/18/24. Patient states she needs an afternoon slot. Patient was scheduled on 12/03/24 at 2 pm. Pre-op instructions were provided w/ surgery details.

## 2024-10-21 ENCOUNTER — Ambulatory Visit: Admitting: Obstetrics and Gynecology

## 2024-11-15 ENCOUNTER — Encounter: Payer: Self-pay | Admitting: Nurse Practitioner

## 2024-11-15 ENCOUNTER — Telehealth: Payer: Self-pay | Admitting: Nurse Practitioner

## 2024-11-15 VITALS — Ht 67.0 in | Wt 238.0 lb

## 2024-11-15 DIAGNOSIS — M170 Bilateral primary osteoarthritis of knee: Secondary | ICD-10-CM | POA: Diagnosis not present

## 2024-11-15 NOTE — Patient Instructions (Addendum)
 Please call the office below to follow-up on your referral in 1 to 2 weeks [facility] Address: [address] [place], [place], [place] [address] Phone: [phone]    It is important that you exercise regularly at least 30 minutes 5 times a week as tolerated  Think about what you will eat, plan ahead. Choose  clean, green, fresh or frozen over canned, processed or packaged foods which are more sugary, salty and fatty. 70 to 75% of food eaten should be vegetables and fruit. Three meals at set times with snacks allowed between meals, but they must be fruit or vegetables. Aim to eat over a 12 hour period , example 7 am to 7 pm, and STOP after  your last meal of the day. Drink water,generally about 64 ounces per day, no other drink is as healthy. Fruit juice is best enjoyed in a healthy way, by EATING the fruit.  Thanks for choosing [facility] we consider it a privelige to serve you.

## 2024-11-15 NOTE — Progress Notes (Signed)
 Virtual Visit via Video  Note  I connected with [name] on 11/15/2024 at  1:20 PM EST by video and verified that I am speaking with the correct person using two identifiers.  I spent 6 minutes on this video encounter  Location: Patient: work  Provider: office   I discussed the limitations, risks, security and privacy concerns of performing an evaluation and management service by telephone and the availability of in person appointments. I also discussed with the patient that there may be a patient responsible charge related to this service. The patient expressed understanding and agreed to proceed.   History of Present Illness: Discussed the use of AI scribe software for clinical note transcription with the patient, who gave verbal consent to proceed.  History of Present Illness Haley [name] is a 58 year old female who presents for a referral to the [facility].  She has arthritis in both knees and has been under the care of an orthopedic doctor at [facility]. She is dissatisfied with the current treatment and is seeking a referral to the [facility] on [facility].  She denies any other concerns today     Observations/Objective: Patient alert and oriented no sign of distress noted  Assessment and Plan: Assessment and Plan Assessment & Plan Osteoarthritis of knees, bilateral Chronic bilateral knee pain from primary osteoarthritis. She was dissatisfied with current orthopedic care and sought referral to a specialized arthritis center. - Sent referral to [facility] on [facility]. - Advised to call the center in 1-2 weeks to follow up on the referral.     Follow Up Instructions:      I discussed the assessment and treatment plan with the patient. The patient was provided an opportunity to ask questions and all were answered. The patient agreed with the plan and demonstrated an understanding of the  instructions.   The patient was advised to call back or seek an in-person evaluation if the symptoms worsen or if the condition fails to improve as anticipated.

## 2024-11-15 NOTE — Assessment & Plan Note (Signed)
 Chronic bilateral knee pain from primary osteoarthritis. She was dissatisfied with current orthopedic care and sought referral to a specialized arthritis center. - Sent referral to [facility] on [facility]. - Advised to call the center in 1-2 weeks to follow up on the referral.

## 2024-11-20 ENCOUNTER — Other Ambulatory Visit: Payer: Self-pay

## 2024-11-20 ENCOUNTER — Other Ambulatory Visit: Payer: Self-pay | Admitting: Nurse Practitioner

## 2024-11-20 ENCOUNTER — Other Ambulatory Visit: Payer: Self-pay | Admitting: Family Medicine

## 2024-11-20 DIAGNOSIS — I10 Essential (primary) hypertension: Secondary | ICD-10-CM

## 2024-11-20 DIAGNOSIS — E0590 Thyrotoxicosis, unspecified without thyrotoxic crisis or storm: Secondary | ICD-10-CM

## 2024-11-20 DIAGNOSIS — E7800 Pure hypercholesterolemia, unspecified: Secondary | ICD-10-CM

## 2024-11-20 DIAGNOSIS — I480 Paroxysmal atrial fibrillation: Secondary | ICD-10-CM

## 2024-11-20 MED ORDER — ATORVASTATIN CALCIUM 20 MG PO TABS
20.0000 mg | ORAL_TABLET | Freq: Every day | ORAL | 0 refills | Status: AC
  Filled 2024-11-20 (×2): qty 90, 90d supply, fill #0

## 2024-11-22 ENCOUNTER — Other Ambulatory Visit: Payer: Self-pay

## 2024-11-22 ENCOUNTER — Other Ambulatory Visit (HOSPITAL_BASED_OUTPATIENT_CLINIC_OR_DEPARTMENT_OTHER): Payer: Self-pay

## 2024-11-22 MED ORDER — APIXABAN 5 MG PO TABS
5.0000 mg | ORAL_TABLET | Freq: Two times a day (BID) | ORAL | 0 refills | Status: AC
  Filled 2024-11-22: qty 60, 30d supply, fill #0

## 2024-11-22 MED ORDER — METOPROLOL SUCCINATE ER 50 MG PO TB24
50.0000 mg | ORAL_TABLET | Freq: Every day | ORAL | 0 refills | Status: AC
  Filled 2024-11-22 – 2024-12-12 (×2): qty 90, 90d supply, fill #0

## 2024-11-25 ENCOUNTER — Telehealth: Payer: Self-pay

## 2024-11-25 NOTE — Telephone Encounter (Signed)
 Patient left a voicemail stating she needed to cancel 12/03/24 surgery. Patient stated she alot of demands at this time.

## 2024-11-29 ENCOUNTER — Other Ambulatory Visit: Payer: Self-pay

## 2024-12-03 ENCOUNTER — Ambulatory Visit (HOSPITAL_COMMUNITY): Admission: RE | Admit: 2024-12-03 | Admitting: Obstetrics and Gynecology

## 2024-12-04 ENCOUNTER — Ambulatory Visit: Payer: Self-pay

## 2024-12-04 ENCOUNTER — Other Ambulatory Visit: Payer: Self-pay

## 2024-12-04 NOTE — Telephone Encounter (Signed)
 FYI Only or Action Required?: FYI only for provider: Heading to hospital, refused 911.  Patient was last seen in primary care on 11/15/2024 by Paseda, Folashade R, FNP.  Called Nurse Triage reporting Nasal Congestion.  Symptoms began a week ago.  Interventions attempted: Rest, hydration, or home remedies.  Symptoms are: rapidly worsening.  Triage Disposition: Call EMS 911 Now  Patient/caregiver understands and will follow disposition?: No, refuses disposition- getting ride to hospital per her preference    Copied from CRM #[id number]. Topic: Clinical - Red Word Triage >> Dec 04, 2024  2:11 PM Victoria B wrote: Patient feels very tired, yellow mucus,mild headache Reason for Disposition  Stridor (harsh sound while breathing in)  Answer Assessment - Initial Assessment Questions This RN recommended that pt be examined in hospital asap for symptoms, pt verbalized understanding, offered to call ambulance for pt, pt refusing at this time, preferring to have someone drive her to ED. Advised call 911 if any worsening or new symptoms.    Out of work for a week, called for work note Started as sniffles lot of snot, turned into cough Then one day just dog tired, couldn't go to work Been evicted, lots of moving in the cold Not able to get rid of the mucus yet Chest was feeling a little tight but took robitussin and that helped Been out of med for about a week, all of them - med list includes eliquis , lipitor, and metoprolol  No asthma, COPD Was having chest pain Coughing up stuff At first coughing was making my chest hurt and making my head hurt Just the past few days, not lately lasting more than 5 min, just know felt it when was coughing Remember feeling for a day or 2 constant chest pain  Call disconnected, calling pt back It was about a 4/10 chest pain Wheezing, whistling sound, and like *pt mimicked a harsh sound breathing in,* confirmed harsh sound breathing in at times Don't think  need to call 911  Protocols used: Breathing Difficulty-A-AH

## 2024-12-07 NOTE — Telephone Encounter (Signed)
 I can not give a work note for that length of time. Also she needs an office visit. She had a tele visit on 12-12 which would not take the place of an office visit for her chronic health conditions

## 2024-12-08 ENCOUNTER — Ambulatory Visit (HOSPITAL_COMMUNITY)
Admission: EM | Admit: 2024-12-08 | Discharge: 2024-12-08 | Disposition: A | Discharge status: Home / Self Care | Attending: Emergency Medicine | Admitting: Emergency Medicine

## 2024-12-08 ENCOUNTER — Encounter (HOSPITAL_COMMUNITY): Payer: Self-pay | Admitting: *Deleted

## 2024-12-08 DIAGNOSIS — R059 Cough, unspecified: Secondary | ICD-10-CM

## 2024-12-08 DIAGNOSIS — J069 Acute upper respiratory infection, unspecified: Secondary | ICD-10-CM

## 2024-12-08 MED ORDER — PROMETHAZINE-DM 6.25-15 MG/5ML PO SYRP
5.0000 mL | ORAL_SOLUTION | Freq: Four times a day (QID) | ORAL | 0 refills | Status: DC | PRN
  Filled 2024-12-08: qty 118, 6d supply, fill #0

## 2024-12-08 NOTE — ED Triage Notes (Signed)
 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

## 2024-12-08 NOTE — Discharge Instructions (Addendum)
 Continue over-the-counter treatment.  You can use the cough syrup at night, this medication may cause drowsiness and help to loosen secretions.  Do not drink alcohol or drive on this medicine.  If the cough persists please follow-up with your primary care provider.  Seek immediate care for any worsening shortness of breath or if you develop fever.

## 2024-12-08 NOTE — ED Provider Notes (Signed)
 " [facility]    CSN: [id number] Arrival date & time: 12/08/24  1051      History   Chief Complaint Chief Complaint  Patient presents with   Letter for School/Work    HPI Haley [name] is a 59 y.o. female.   Patient presents to clinic over concern of cough and congestion.  She was out of work last week and needs a note to return.  Does endorse wheezing and shortness of breath.  Has tried over-the-counter treatment, cough has been improving.  Has not had fevers or fatigue.  Smokes about 1/2 PPD of black and milds per day.  No history of asthma or COPD.  The history is provided by the patient and medical records.    Past Medical History:  Diagnosis Date   Essential hypertension    Fibroids    Hyperthyroidism    a. 2010, treated w/ tapazole .   Morbid obesity (HCC)    Paroxysmal atrial fibrillation (HCC)    a. 03/2009 - in setting of hyperthyroidism and caffeine intake, converted spontaneously;  b. 08/2016 ED visit for recurrent PAF-->successful DCCV in ED.   Snores    Type II diabetes mellitus (HCC)     Patient Active Problem List   Diagnosis Date Noted   Grief counseling 11/20/2023   Insomnia secondary to situational depression 11/20/2023   Pain in abdominal muscle of right flank 09/12/2023   Bilateral knee pain 02/21/2023   Osteoarthritis of knees, bilateral 02/21/2023   Chronic diastolic CHF (congestive heart failure) (HCC) 02/17/2022   Abnormal vaginal bleeding in postmenopausal patient 11/25/2020   Goiter with hyperthyroidism    UARS (upper airway resistance syndrome)    Hyperthyroidism    Acute diastolic (congestive) heart failure (HCC) 12/21/2019   Hypoxia 12/21/2019   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   Current use of long term anticoagulation 02/19/2019   Colon cancer screening 02/19/2019   Pure hypercholesterolemia 02/19/2019   Breast cancer screening 02/19/2019   Class 3 severe obesity due to excess calories with serious comorbidity and  body mass index (BMI) of 45.0 to 49.9 in adult (HCC) 01/27/2019   Hypomagnesemia 01/27/2019   Type II diabetes mellitus (HCC) 01/01/2019   Severe sleep apnea 01/14/2017   Hypoxemia 01/14/2017   Paroxysmal atrial fibrillation (HCC)    Essential hypertension     Past Surgical History:  Procedure Laterality Date   UTERINE FIBROID SURGERY      OB History   No obstetric history on file.      Home Medications    Prior to Admission medications  Medication Sig Start Date End Date Taking? Authorizing Provider  apixaban  (ELIQUIS ) 5 MG TABS tablet Take 1 tablet (5 mg total) by mouth 2 (two) times daily. 11/22/24  Yes Theotis Haze ORN, NP  atorvastatin  (LIPITOR) 20 MG tablet Take 1 tablet (20 mg total) by mouth daily. 11/20/24  Yes Newlin, Enobong, MD  Dulaglutide  (TRULICITY ) 1.5 MG/0.5ML SOAJ Inject 1.5 mg into the skin once a week.Must have office visit for refills 04/25/24  Yes Newlin, Corrina, MD  furosemide  (LASIX ) 20 MG tablet Take 1 tablet (20 mg total) by mouth daily. 05/17/24  Yes Fleming, Zelda W, NP  gabapentin  (NEURONTIN ) 100 MG capsule Take 1 capsule (100 mg total) by mouth 3 (three) times daily. 05/17/24  Yes Fleming, Zelda W, NP  glipiZIDE  (GLUCOTROL ) 10 MG tablet Take 1 tablet (10 mg total) by mouth 2 (two) times daily before a meal. 05/17/24  Yes Theotis Haze ORN, NP  lisinopril  (ZESTRIL ) 2.5 MG tablet Take 1 tablet (2.5 mg total) by mouth daily. 05/17/24  Yes Fleming, Zelda W, NP  metFORMIN  (GLUCOPHAGE -XR) 500 MG 24 hr tablet Take 4 tablets (2,000 mg total) by mouth at bedtime. 05/17/24  Yes Fleming, Zelda W, NP  methimazole  (TAPAZOLE ) 5 MG tablet Take 1 tablet (5 mg total) by mouth daily. 05/24/23  Yes Trixie File, MD  metoprolol  succinate (TOPROL -XL) 50 MG 24 hr tablet Take 1 tablet (50 mg total) by mouth daily.Must have office visit for refills per Cardiology 11/22/24  Yes Fleming, Zelda W, NP  norethindrone  (AYGESTIN ) 5 MG tablet Take 1 tablet (5 mg total) by mouth daily.  03/18/24  Yes Ajewole, Christana, MD  promethazine -dextromethorphan (PROMETHAZINE -DM) 6.25-15 MG/5ML syrup Take 5 mLs by mouth 4 (four) times daily as needed for cough. 12/08/24  Yes Haiden Clucas  N, FNP  Blood Glucose Monitoring Suppl (TRUE METRIX METER) w/Device KIT Use twice daily 02/19/19   Brien Belvie BRAVO, MD  buPROPion  (WELLBUTRIN  SR) 150 MG 12 hr tablet Take 1 tablet (150 mg total) by mouth daily for 3 days, THEN 1 tablet (150 mg total) 2 (two) times daily. 08/15/24 12/20/24  Fleming, Zelda W, NP  cetirizine  (ZYRTEC  ALLERGY) 10 MG tablet Take 1 tablet (10 mg total) by mouth daily. 10/02/24   Mayers, Cari S, PA-C  ciclopirox  (PENLAC ) 8 % solution Apply topically at bedtime. Apply over nail and surrounding skin. Apply daily over previous coat. After seven (7) days, may remove with alcohol and continue cycle. 05/17/24   Fleming, Zelda W, NP  Dulaglutide  (TRULICITY ) 1.5 MG/0.5ML SOAJ Inject 1.5 mg into the skin once a week. 05/17/24   Fleming, Zelda W, NP  fluticasone  (FLONASE ) 50 MCG/ACT nasal spray Place 2 sprays into both nostrils daily. Patient not taking: No sig reported 10/02/24   Mayers, Cari S, PA-C  glucose blood (TRUE METRIX BLOOD GLUCOSE TEST) test strip Use as instructed. Check blood glucose level by fingerstick twice per day. 03/24/20   Fleming, Zelda W, NP  Methylsulfonylmethane (MSM PO) Take 2 tablets by mouth daily.    [provider]  Multiple Vitamins-Minerals (ALIVE WOMENS 50+) TABS Take 1 tablet by mouth daily. 03/22/23   Fleming, Zelda W, NP  ondansetron  (ZOFRAN ) 4 MG tablet Take 1 tablet (4 mg total) by mouth every 8 (eight) hours as needed for nausea or vomiting. Patient not taking: No sig reported 03/18/24   Ajewole, Christana, MD  triamcinolone  cream (KENALOG ) 0.1 % Apply 1 Application topically 2 (two) times daily. 05/17/24   Theotis Haze ORN, NP  TRUEplus Lancets 28G MISC Use twice daily for blood glucose check 03/24/20   Theotis Haze ORN, NP    Family  History Family History  Problem Relation Age of Onset   Hypertension Mother    Diabetes Mellitus II Sister    Cancer Maternal Grandmother    Breast cancer Neg Hx     Social History Social History[1]   Allergies   Patient has no known allergies.   Review of Systems Review of Systems  Per HPI  Physical Exam Triage Vital Signs ED Triage Vitals  Encounter Vitals Group     BP 12/08/24 1114 (!) 154/74     Girls Systolic BP Percentile --      Girls Diastolic BP Percentile --      Boys Systolic BP Percentile --      Boys Diastolic BP Percentile --      Pulse Rate 12/08/24 1114 65     Resp  12/08/24 1114 18     Temp 12/08/24 1114 99.2 F (37.3 C)     Temp Source 12/08/24 1114 Oral     SpO2 12/08/24 1114 93 %     Weight --      Height --      Head Circumference --      Peak Flow --      Pain Score 12/08/24 1112 0     Pain Loc --      Pain Education --      Exclude from Growth Chart --    No data found.  Updated Vital Signs BP (!) 154/74 (BP Location: Right Arm)   Pulse 65   Temp 99.2 F (37.3 C) (Oral)   Resp 18   LMP  (LMP Unknown)   SpO2 93%   Visual Acuity Right Eye Distance:   Left Eye Distance:   Bilateral Distance:    Right Eye Near:   Left Eye Near:    Bilateral Near:     Physical Exam Vitals and nursing note reviewed.  Constitutional:      Appearance: Normal appearance.  HENT:     Head: Normocephalic and atraumatic.     Right Ear: External ear normal.     Left Ear: External ear normal.     Nose: Nose normal.     Mouth/Throat:     Mouth: Mucous membranes are moist.     Pharynx: Posterior oropharyngeal erythema present.  Eyes:     Conjunctiva/sclera: Conjunctivae normal.  Cardiovascular:     Rate and Rhythm: Normal rate and regular rhythm.     Heart sounds: Normal heart sounds. No murmur heard. Pulmonary:     Effort: Pulmonary effort is normal. No respiratory distress.     Breath sounds: Normal breath sounds. No wheezing.  Neurological:      General: No focal deficit present.     Mental Status: She is alert.  Psychiatric:        Mood and Affect: Mood normal.      UC Treatments / Results  Labs (all labs ordered are listed, but only abnormal results are displayed) Labs Reviewed - No data to display  EKG   Radiology No results found.  Procedures Procedures (including critical care time)  Medications Ordered in UC Medications - No data to display  Initial Impression / Assessment and Plan / UC Course  I have reviewed the triage vital signs and the nursing notes.  Pertinent labs & imaging results that were available during my care of the patient were reviewed by me and considered in my medical decision making (see chart for details).  Vitals and triage reviewed, patient is hemodynamically stable.  Lungs vesicular, heart with regular rate and rhythm.  Posterior pharynx with erythema.  Suspect postviral cough versus smoker's cough.  Will treat symptomatically.  Afebrile evidence of bacterial etiology.  Plan of care, follow-up care and return precautions given, no questions at this time.  Work note provided.    Final Clinical Impressions(s) / UC Diagnoses   Final diagnoses:  Viral URI with cough  Cough, unspecified type     Discharge Instructions      Continue over-the-counter treatment.  You can use the cough syrup at night, this medication may cause drowsiness and help to loosen secretions.  Do not drink alcohol or drive on this medicine.  If the cough persists please follow-up with your primary care provider.  Seek immediate care for any worsening shortness of breath or if you  develop fever.     ED Prescriptions     Medication Sig Dispense Auth. Provider   promethazine -dextromethorphan (PROMETHAZINE -DM) 6.25-15 MG/5ML syrup Take 5 mLs by mouth 4 (four) times daily as needed for cough. 118 mL Dreama, Jaquavion Mccannon  N, FNP      PDMP not reviewed this encounter.     [1]  Social History Tobacco  Use   Smoking status: Every Day    Types: Cigars   Smokeless tobacco: Never   Tobacco comments:    patient vapes  Vaping Use   Vaping status: Former  Substance Use Topics   Alcohol use: Yes    Comment: occasional drink.   Drug use: Yes    Types: Marijuana     Dreama Lon SAILOR, FNP 12/08/24 1148  "

## 2024-12-09 ENCOUNTER — Other Ambulatory Visit: Payer: Self-pay

## 2024-12-09 NOTE — Telephone Encounter (Signed)
Unable to reach patient by phone. Voicemail left to return call.

## 2024-12-09 NOTE — Telephone Encounter (Signed)
 LMOVM requesting call back. Direct number provided. Will remind patient of upcoming appointment on 12/17/24 and encourage compliance with scheduled appointment to avoid dismissal from Dr. Dorine practice.

## 2024-12-12 ENCOUNTER — Other Ambulatory Visit: Payer: Self-pay

## 2024-12-17 ENCOUNTER — Telehealth: Payer: Self-pay

## 2024-12-17 ENCOUNTER — Ambulatory Visit: Attending: Cardiology | Admitting: Cardiology

## 2024-12-17 NOTE — Telephone Encounter (Signed)
 Haley [name]

## 2025-01-08 ENCOUNTER — Other Ambulatory Visit: Payer: Self-pay | Admitting: Nurse Practitioner

## 2025-01-08 ENCOUNTER — Other Ambulatory Visit: Payer: Self-pay

## 2025-01-08 ENCOUNTER — Telehealth: Payer: Self-pay | Admitting: Pharmacy Technician

## 2025-01-08 DIAGNOSIS — E119 Type 2 diabetes mellitus without complications: Secondary | ICD-10-CM

## 2025-01-08 DIAGNOSIS — G629 Polyneuropathy, unspecified: Secondary | ICD-10-CM

## 2025-01-08 MED ORDER — GABAPENTIN 100 MG PO CAPS
100.0000 mg | ORAL_CAPSULE | Freq: Three times a day (TID) | ORAL | 0 refills | Status: AC
  Filled 2025-01-08: qty 90, 30d supply, fill #0

## 2025-01-09 ENCOUNTER — Ambulatory Visit: Payer: Medicaid Other | Admitting: Internal Medicine

## 2025-01-09 ENCOUNTER — Other Ambulatory Visit

## 2025-01-09 ENCOUNTER — Other Ambulatory Visit: Payer: Self-pay

## 2025-01-09 ENCOUNTER — Encounter: Payer: Self-pay | Admitting: Internal Medicine

## 2025-01-09 VITALS — BP 124/70 | HR 69 | Ht 67.0 in | Wt 243.0 lb

## 2025-01-09 DIAGNOSIS — E0500 Thyrotoxicosis with diffuse goiter without thyrotoxic crisis or storm: Secondary | ICD-10-CM

## 2025-01-09 DIAGNOSIS — E040 Nontoxic diffuse goiter: Secondary | ICD-10-CM

## 2025-01-09 DIAGNOSIS — E0590 Thyrotoxicosis, unspecified without thyrotoxic crisis or storm: Secondary | ICD-10-CM

## 2025-01-09 NOTE — Progress Notes (Signed)
 Patient ID: Haley [name], female   DOB: Nov 17, 1966, 59 y.o.   MRN: [id number]   HPI  Haley [name] is a 59 y.o.-year-old female, initially referred by her PCP, Theotis Haze ORN, NP, returning for follow-up for Graves' disease  and also large goiter.  Last visit 1 year ago.  Interim history: No new palpitations (has Afib - rare now), no heat intolerance (resolved hot flushes), no unintentional weight loss but lost a net 10 pounds since last visit with dietary changes and on Trulicity .   Reviewed history: Patient has a history of thyrotoxicosis since 2010. At that time she was hospitalized with pericarditis and Acute heart failure.   After diagnosis, she was started on a medication (? MMI) >> she could not tolerate 2/2 extreme fatigue >> changed to another medication (? PTU).  Tests normalized afterwards and she was taken off the medication.    However, she again started to have symptoms: Weakness, shortness of breath.  The TSH was rechecked and this was low while her free T4 was high.   She was started on methimazole  and Toprol .  Due to insomnia,  we increased her metoprolol  to the previous dose, but gave her the higher dose at night.  She is treated with: - Methimazole  5 mg 3 times a day  >> 5 mg twice a day >> 5 mg once a day >> 2.5 mg daily (03/2021) >> stopped 09/2021 - Toprol -XL 50 mg daily + 25 mg at night >> 50 mg daily >> 25 mg in a.m. and 50 mg at night >> 25 mg daily  She saw Dr. Eletha in 05/2021 but declined thyroidectomy at that time.  In 09/2021, I ordered another thyroid  ultrasound but she did not have this yet.  In 12/2022, at her visit with PCP, her TSH was undetectable and she was started back on methimazole  5 mg daily.   I reviewed her TFTs: Lab Results  Component Value Date   TSH 0.48 07/09/2024   TSH 0.41 01/09/2024   TSH 1.73 05/23/2023   TSH <0.005 (L) 12/19/2022   TSH 1.01 03/31/2022   TSH 1.530 02/15/2022   Lab Results  Component Value Date    FREET4 1.1 07/09/2024   FREET4 1.3 01/09/2024   FREET4 0.73 05/23/2023   FREET4 0.91 03/31/2022   FREET4 0.61 03/05/2021   FREET4 1.63 08/28/2020   FREET4 0.75 04/23/2020   FREET4 0.45 (L) 01/24/2020   FREET4 1.33 (H) 12/22/2019   No components found for: FREET3   Her TSI antibodies were elevated: Lab Results  Component Value Date   TSI 375 (H) 05/23/2023   TSI 658 (H) 01/24/2020    Recent LFTs were normal: Lab Results  Component Value Date   ALT 15 08/13/2024   AST 17 08/13/2024   ALKPHOS 71 08/13/2024   BILITOT 0.6 08/13/2024   No h./o low WBC: Lab Results  Component Value Date   WBC 8.0 09/12/2023   HGB 15.9 09/12/2023   HCT 49.0 (H) 09/12/2023   MCV 97 09/12/2023   PLT 239 09/12/2023   She also has a large goiter with substernal extension.  Thyroid  ultrasound (05/08/2019): Enlarged thyroid , no nodules. Inferior margin of the left thyroid  lobe is difficult to visualize due to the substernal extension. CXR (12/21/2019): Per review of the images, trachea is slightly deviated to the right CT angio chest (12/22/2019): Stable, large goiter, extending into the substernal space CT angio chest (07/11/2022): There is an enlarged thyroid  gland with the left lobe  extending into the upper mediastinum.  At the time of her diagnosis, she complained of: - weight gain, however, she had weight loss per review of the chart, approximately 7 pounds net in the last month - Heat intolerance-chronic - Poor sleep - Shortness of breath - Leg swelling - Hair loss  She continues to have shortness of breath, possibly also related to deconditioning  but her sleep and hair loss improved.   She has hot flashes and palpitations, which are are chronic.  She sees cardiology (Dr. Burnard).   Pt denies: - feeling nodules in neck - hoarseness - dysphagia - choking  Pt does have a FH of thyroid  ds.: M aunt and M cousin. No FH of thyroid  cancer. No h/o radiation tx to head or neck. No herbal  supplements. No Biotin use. No recent steroids use.   Patient also has CHF, and also severe OSA-started on CPAP in 12/2019 -felt much better afterwards. She had to stop as her hose broke >> changed to on BiPAP.  She has DM2, managed by PCP >> HbA1c levels reviewed: Lab Results  Component Value Date   HGBA1C 5.8 (A) 10/02/2024   HGBA1C 5.8 10/02/2024   HGBA1C 5.8 10/02/2024   HGBA1C 5.8 10/02/2024  She is on metformin , glipizide , Trulicity .  ROS: + see HPI  I reviewed pt's medications, allergies, PMH, social hx, family hx, and changes were documented in the history of present illness. Otherwise, unchanged from my initial visit note.  Past Medical History:  Diagnosis Date   Essential hypertension    Fibroids    Hyperthyroidism    a. 2010, treated w/ tapazole .   Morbid obesity (HCC)    Paroxysmal atrial fibrillation (HCC)    a. 03/2009 - in setting of hyperthyroidism and caffeine intake, converted spontaneously;  b. 08/2016 ED visit for recurrent PAF-->successful DCCV in ED.   Snores    Type II diabetes mellitus (HCC)    Past Surgical History:  Procedure Laterality Date   UTERINE FIBROID SURGERY     Social History   Socioeconomic History   Marital status: Married    Spouse name: Not on file   Number of children: 0   Years of education: Not on file   Highest education level: Not on file  Occupational History   Occupation:  Environmental health practitioner, scientist, research (medical)  Tobacco Use   Smoking status: Former Smoker, quit in 2018    Packs/day: 1.00    Years: 32.00    Pack years: 32.00    Quit date: 2012    Years since quitting: 9.1   Smokeless tobacco: Never Used   Tobacco comment: patient vapes  Substance and Sexual Activity   Alcohol use: No    Comment: Wine, 2-3 glasses, every 3 weeks   Drug use: No  Social History Narrative   Lives in [place] with husband.  English as a second language teacher.  Also in school @ [employer] for PT technician.     Social Determinants of Health   Financial Resource  Strain:    Difficulty of Paying Living Expenses: Not on file  Food Insecurity:    Worried About Programme Researcher, Broadcasting/film/video in the Last Year: Not on file   [employer] of Food in the Last Year: Not on file  Transportation Needs:    Lack of Transportation (Medical): Not on file   Lack of Transportation (Non-Medical): Not on file  Physical Activity:    Days of Exercise per Week: Not on file   Minutes of Exercise per Session:  Not on file  Stress:    Feeling of Stress : Not on file  Social Connections:    Frequency of Communication with Friends and Family: Not on file   Frequency of Social Gatherings with Friends and Family: Not on file   Attends Religious Services: Not on file   Active Member of Clubs or Organizations: Not on file   Attends Banker Meetings: Not on file   Marital Status: Not on file  Intimate Partner Violence:    Fear of Current or Ex-Partner: Not on file   Emotionally Abused: Not on file   Physically Abused: Not on file   Sexually Abused: Not on file   Current Outpatient Medications on File Prior to Visit  Medication Sig Dispense Refill   apixaban  (ELIQUIS ) 5 MG TABS tablet Take 1 tablet (5 mg total) by mouth 2 (two) times daily. 60 tablet 0   atorvastatin  (LIPITOR) 20 MG tablet Take 1 tablet (20 mg total) by mouth daily. 90 tablet 0   Blood Glucose Monitoring Suppl (TRUE METRIX METER) w/Device KIT Use twice daily 1 kit 0   buPROPion  (WELLBUTRIN  SR) 150 MG 12 hr tablet Take 1 tablet (150 mg total) by mouth daily for 3 days, THEN 1 tablet (150 mg total) 2 (two) times daily. 60 tablet 3   cetirizine  (ZYRTEC  ALLERGY) 10 MG tablet Take 1 tablet (10 mg total) by mouth daily. 30 tablet 11   ciclopirox  (PENLAC ) 8 % solution Apply topically at bedtime. Apply over nail and surrounding skin. Apply daily over previous coat. After seven (7) days, may remove with alcohol and continue cycle. 6.6 mL 1   Dulaglutide  (TRULICITY ) 1.5 MG/0.5ML SOAJ Inject 1.5 mg into the skin once a  week.Must have office visit for refills 2 mL 0   Dulaglutide  (TRULICITY ) 1.5 MG/0.5ML SOAJ Inject 1.5 mg into the skin once a week. 2 mL 3   fluticasone  (FLONASE ) 50 MCG/ACT nasal spray Place 2 sprays into both nostrils daily. (Patient not taking: No sig reported) 16 g 6   furosemide  (LASIX ) 20 MG tablet Take 1 tablet (20 mg total) by mouth daily. 90 tablet 1   gabapentin  (NEURONTIN ) 100 MG capsule Take 1 capsule (100 mg total) by mouth 3 (three) times daily. 90 capsule 0   glipiZIDE  (GLUCOTROL ) 10 MG tablet Take 1 tablet (10 mg total) by mouth 2 (two) times daily before a meal. 180 tablet 1   glucose blood (TRUE METRIX BLOOD GLUCOSE TEST) test strip Use as instructed. Check blood glucose level by fingerstick twice per day. 200 each 12   lisinopril  (ZESTRIL ) 2.5 MG tablet Take 1 tablet (2.5 mg total) by mouth daily. 90 tablet 1   metFORMIN  (GLUCOPHAGE -XR) 500 MG 24 hr tablet Take 4 tablets (2,000 mg total) by mouth at bedtime. 360 tablet 1   methimazole  (TAPAZOLE ) 5 MG tablet Take 1 tablet (5 mg total) by mouth daily. 90 tablet 3   Methylsulfonylmethane (MSM PO) Take 2 tablets by mouth daily.     metoprolol  succinate (TOPROL -XL) 50 MG 24 hr tablet Take 1 tablet (50 mg total) by mouth daily.Must have office visit for refills per Cardiology 90 tablet 0   Multiple Vitamins-Minerals (ALIVE WOMENS 50+) TABS Take 1 tablet by mouth daily. 90 tablet 3   norethindrone  (AYGESTIN ) 5 MG tablet Take 1 tablet (5 mg total) by mouth daily. 30 tablet 2   ondansetron  (ZOFRAN ) 4 MG tablet Take 1 tablet (4 mg total) by mouth every 8 (eight) hours as needed  for nausea or vomiting. (Patient not taking: No sig reported) 10 tablet 0   promethazine -dextromethorphan (PROMETHAZINE -DM) 6.25-15 MG/5ML syrup Take 5 mLs by mouth 4 (four) times daily as needed for cough. 118 mL 0   triamcinolone  cream (KENALOG ) 0.1 % Apply 1 Application topically 2 (two) times daily. 30 g 1   TRUEplus Lancets 28G MISC Use twice daily for blood  glucose check 200 each 6   No current facility-administered medications on file prior to visit.   No Known Allergies Family History  Problem Relation Age of Onset   Hypertension Mother    Diabetes Mellitus II Sister    Cancer Maternal Grandmother    Breast cancer Neg Hx    PE: BP 124/70   Pulse 69   Ht 5' 7 (1.702 m)   Wt 243 lb (110.2 kg)   LMP  (LMP Unknown)   SpO2 90%   BMI 38.06 kg/m  Wt Readings from Last 10 Encounters:  01/09/25 243 lb (110.2 kg)  11/15/24 238 lb (108 kg)  10/02/24 241 lb (109.3 kg)  09/20/24 240 lb 4.8 oz (109 kg)  08/13/24 240 lb (108.9 kg)  05/17/24 247 lb 9.6 oz (112.3 kg)  03/13/24 253 lb 6.4 oz (114.9 kg)  02/14/24 252 lb 9.6 oz (114.6 kg)  01/09/24 253 lb 3.2 oz (114.9 kg)  11/16/23 251 lb 3.2 oz (113.9 kg)   Constitutional: overweight, in NAD Eyes: EOMI, no exophthalmos, no lid lag, no stare ENT: no thyromegaly felt on palpation, no cervical lymphadenopathy Cardiovascular: RRR, No MRG, no LE edema Respiratory: CTA B Musculoskeletal: no deformities Skin: no rashes Neurological: no tremor with outstretched hands  ASSESSMENT: 1. Thyrotoxicosis  2.  Goiter  PLAN:  1. Patient with history of thyrotoxicosis, with initial thyrotoxic symptoms including weight loss, heat intolerance, shortness of breath, weakness.  TSI's returned elevated, confirming Graves' disease.  She was started on methimazole , which we were then able to decrease gradually and stopped completely in 09/2021.  She was also previously on Toprol  XL 25 mg daily which was helping her sleep.  However, the TSH returned suppressed, undetectable, in 03/2023 and PCP restarted her methimazole  at 5 mg daily.  TSI is improved over time.  At last visit, she was still on the above dose of methimazole  and was feeling well, without significant complaints.  She did have occasional palpitations and weight loss of 3 more pounds, previously she lost a significant amount of weight (40 pounds) on  Trulicity ).  She also had significant stress with her husband passing away before our visit in 2024. - At today's visit, she is still on 5 mg of methimazole . - She does not have thyrotoxic signs or symptoms. She lost 10 lbs intentional - through diet changes. -She was previously on biotin but not anymore, only the amount in multivitamins.  She takes Alive 50+ multivitamins with 33 mcg Biotin.  We discussed that this is a small amount of Biotin so we can check her labs today, however, I advised her to only take mandatory medications in the day of labs. - At today's visit, we will recheck her TFTs and change the methimazole  dose accordingly - Will continue the Toprol -XL for now - Plan to see her back in 1 year, but possibly sooner for labs  2.  Goiter - Large, extending in the mediastinum.  On the chest x-ray from 12/2019, the trachea was slightly deviated to the right.  She had CT scans afterwards showing a large goiter, extending in the  upper mediastinum - She did not previously describe significant neck compression symptoms but she had occasional cough and saliva catching in her throat, with the symptoms improving after starting the CPAP and then switching to BiPAP.  She had shortness of breath and oxygen desaturation with walking but it was difficult to know whether the symptoms were related to thyroid  compression or sleep apnea/deconditioning.  I did feel that her breathing will improve after thyroidectomy and this was also able to control her thyrotoxicosis, and I referred her to Dr. Eletha for thyroidectomy.  She saw him in 05/2021, however, at that time, she decided not to proceed with thyroidectomy. -In 2023, I advised her to get another ultrasound to see if the thyroid  gland was larger, but she did not have this and we then discussed about not obtaining another ultrasound unless she had new symptoms - She has no neck compression symptoms at today's visit so for now we will continue to follow her  expectantly  Needs refills.  Orders Placed This Encounter  Procedures   TSH   T4, free   T3, free   Thyroid  stimulating immunoglobulin   Lela Fendt, MD PhD [facility] End[facility]

## 2025-01-09 NOTE — Patient Instructions (Signed)
 Please stop at the lab.  Please continue Methimazole 5 mg daily.  You should have an endocrinology follow-up appointment in 1 year but in 6 months for labs.

## 2025-01-10 ENCOUNTER — Other Ambulatory Visit: Payer: Self-pay

## 2025-01-10 ENCOUNTER — Ambulatory Visit: Payer: Self-pay | Admitting: Internal Medicine

## 2025-01-10 LAB — TSH: TSH: 0.53 m[IU]/L (ref 0.40–4.50)

## 2025-01-10 LAB — T4, FREE: Free T4: 1.4 ng/dL (ref 0.8–1.8)

## 2025-01-10 LAB — T3, FREE: T3, Free: 3.4 pg/mL (ref 2.3–4.2)

## 2025-01-10 MED ORDER — METHIMAZOLE 5 MG PO TABS
5.0000 mg | ORAL_TABLET | Freq: Every day | ORAL | 3 refills | Status: AC
  Filled 2025-01-10 (×2): qty 90, 90d supply, fill #0

## 2025-01-10 NOTE — Addendum Note (Signed)
 Addended by: TRIXIE FILE on: 01/10/2025 04:09 PM   Modules accepted: Orders

## 2025-02-11 ENCOUNTER — Ambulatory Visit: Admitting: Cardiology

## 2026-01-08 ENCOUNTER — Ambulatory Visit: Admitting: Internal Medicine
